# Patient Record
Sex: Female | Born: 1950
Health system: Southern US, Community
[De-identification: ages and names within clinical notes are randomized; demographics above are authoritative.]

## PROBLEM LIST (undated history)

## (undated) DIAGNOSIS — D649 Anemia, unspecified: Secondary | ICD-10-CM

## (undated) DIAGNOSIS — K219 Gastro-esophageal reflux disease without esophagitis: Secondary | ICD-10-CM

## (undated) DIAGNOSIS — T7840XA Allergy, unspecified, initial encounter: Secondary | ICD-10-CM

## (undated) DIAGNOSIS — I10 Essential (primary) hypertension: Secondary | ICD-10-CM

## (undated) DIAGNOSIS — H3530 Unspecified macular degeneration: Secondary | ICD-10-CM

## (undated) DIAGNOSIS — H35039 Hypertensive retinopathy, unspecified eye: Secondary | ICD-10-CM

## (undated) DIAGNOSIS — H269 Unspecified cataract: Secondary | ICD-10-CM

## (undated) DIAGNOSIS — Z5189 Encounter for other specified aftercare: Secondary | ICD-10-CM

## (undated) DIAGNOSIS — F419 Anxiety disorder, unspecified: Secondary | ICD-10-CM

## (undated) DIAGNOSIS — M1990 Unspecified osteoarthritis, unspecified site: Secondary | ICD-10-CM

## (undated) HISTORY — DX: Encounter for other specified aftercare: Z51.89

## (undated) HISTORY — DX: Unspecified macular degeneration: H35.30

## (undated) HISTORY — PX: ACHILLES TENDON REPAIR: SUR1153

## (undated) HISTORY — DX: Allergy, unspecified, initial encounter: T78.40XA

## (undated) HISTORY — PX: HERNIA REPAIR: SHX51

## (undated) HISTORY — DX: Essential (primary) hypertension: I10

## (undated) HISTORY — PX: JOINT REPLACEMENT: SHX530

## (undated) HISTORY — PX: ABDOMINAL HYSTERECTOMY: SHX81

## (undated) HISTORY — PX: SPINE SURGERY: SHX786

## (undated) HISTORY — PX: BILATERAL CARPAL TUNNEL RELEASE: SHX6508

## (undated) HISTORY — PX: COLONOSCOPY: SHX174

## (undated) HISTORY — DX: Hypertensive retinopathy, unspecified eye: H35.039

## (undated) HISTORY — PX: APPENDECTOMY: SHX54

## (undated) HISTORY — DX: Anxiety disorder, unspecified: F41.9

## (undated) HISTORY — PX: BACK SURGERY: SHX140

## (undated) HISTORY — PX: CHOLECYSTECTOMY: SHX55

## (undated) HISTORY — DX: Unspecified cataract: H26.9

---

## 2015-06-14 ENCOUNTER — Ambulatory Visit (INDEPENDENT_AMBULATORY_CARE_PROVIDER_SITE_OTHER): Payer: Medicare Other | Admitting: Emergency Medicine

## 2015-06-14 VITALS — BP 122/80 | HR 63 | Temp 98.4°F | Resp 16 | Ht 59.0 in | Wt 221.0 lb

## 2015-06-14 DIAGNOSIS — G4700 Insomnia, unspecified: Secondary | ICD-10-CM

## 2015-06-14 MED ORDER — ZOLPIDEM TARTRATE 5 MG PO TABS: 10.0000 mg | ORAL_TABLET | Freq: Every evening | ORAL | Status: DC | PRN

## 2015-06-14 NOTE — Patient Instructions (Signed)
Insomnia Insomnia is frequent trouble falling and/or staying asleep. Insomnia can be a long term problem or a short term problem. Both are common. Insomnia can be a short term problem when the wakefulness is related to a certain stress or worry. Long term insomnia is often related to ongoing stress during waking hours and/or poor sleeping habits. Overtime, sleep deprivation itself can make the problem worse. Every little thing feels more severe because you are overtired and your ability to cope is decreased. CAUSES   Stress, anxiety, and depression.  Poor sleeping habits.  Distractions such as TV in the bedroom.  Naps close to bedtime.  Engaging in emotionally charged conversations before bed.  Technical reading before sleep.  Alcohol and other sedatives. They may make the problem worse. They can hurt normal sleep patterns and normal dream activity.  Stimulants such as caffeine for several hours prior to bedtime.  Pain syndromes and shortness of breath can cause insomnia.  Exercise late at night.  Changing time zones may cause sleeping problems (jet lag). It is sometimes helpful to have someone observe your sleeping patterns. They should look for periods of not breathing during the night (sleep apnea). They should also look to see how long those periods last. If you live alone or observers are uncertain, you can also be observed at a sleep clinic where your sleep patterns will be professionally monitored. Sleep apnea requires a checkup and treatment. Give your caregivers your medical history. Give your caregivers observations your family has made about your sleep.  SYMPTOMS   Not feeling rested in the morning.  Anxiety and restlessness at bedtime.  Difficulty falling and staying asleep. TREATMENT   Your caregiver may prescribe treatment for an underlying medical disorders. Your caregiver can give advice or help if you are using alcohol or other drugs for self-medication. Treatment  of underlying problems will usually eliminate insomnia problems.  Medications can be prescribed for short time use. They are generally not recommended for lengthy use.  Over-the-counter sleep medicines are not recommended for lengthy use. They can be habit forming.  You can promote easier sleeping by making lifestyle changes such as:  Using relaxation techniques that help with breathing and reduce muscle tension.  Exercising earlier in the day.  Changing your diet and the time of your last meal. No night time snacks.  Establish a regular time to go to bed.  Counseling can help with stressful problems and worry.  Soothing music and white noise may be helpful if there are background noises you cannot remove.  Stop tedious detailed work at least one hour before bedtime. HOME CARE INSTRUCTIONS   Keep a diary. Inform your caregiver about your progress. This includes any medication side effects. See your caregiver regularly. Take note of:  Times when you are asleep.  Times when you are awake during the night.  The quality of your sleep.  How you feel the next day. This information will help your caregiver care for you.  Get out of bed if you are still awake after 15 minutes. Read or do some quiet activity. Keep the lights down. Wait until you feel sleepy and go back to bed.  Keep regular sleeping and waking hours. Avoid naps.  Exercise regularly.  Avoid distractions at bedtime. Distractions include watching television or engaging in any intense or detailed activity like attempting to balance the household checkbook.  Develop a bedtime ritual. Keep a familiar routine of bathing, brushing your teeth, climbing into bed at the same   time each night, listening to soothing music. Routines increase the success of falling to sleep faster.  Use relaxation techniques. This can be using breathing and muscle tension release routines. It can also include visualizing peaceful scenes. You can  also help control troubling or intruding thoughts by keeping your mind occupied with boring or repetitive thoughts like the old concept of counting sheep. You can make it more creative like imagining planting one beautiful flower after another in your backyard garden.  During your day, work to eliminate stress. When this is not possible use some of the previous suggestions to help reduce the anxiety that accompanies stressful situations. MAKE SURE YOU:   Understand these instructions.  Will watch your condition.  Will get help right away if you are not doing well or get worse. Document Released: 10/18/2000 Document Revised: 01/13/2012 Document Reviewed: 11/18/2007 [employer] Patient Information 2015 [employer]. This information is not intended to replace advice given to you by your health care provider. Make sure you discuss any questions you have with your health care provider.  

## 2015-06-14 NOTE — Progress Notes (Signed)
Subjective:  Patient ID: Faith Reeves, female    DOB: 30-Jun-1951  Age: 64 y.o. MRN: [id number]  CC: Medication Refill   HPI Faith Reeves presents  patients recently moved here from [place]. She was taking Ambien for her insomnia. She's run out of her medication and is no longer seeing a Dr. Janalee Reeves. She has not yet established care with a regular doctor here in town. Medication well has no hangover or adverse effect of the medication. She denies any other new or chronic complaints  History Faith Reeves has a past medical history of Blood transfusion without reported diagnosis; Cataract; and Macular degeneration.   She has past surgical history that includes Cholecystectomy; Hernia repair; Abdominal hysterectomy; Joint replacement; and Back surgery.   Her  family history includes Hypertension in her mother.  She   reports that she has never smoked. She does not have any smokeless tobacco history on file. Her alcohol and drug histories are not on file.  No outpatient prescriptions prior to visit.   No facility-administered medications prior to visit.    Social History   Social History  . Marital Status: Married    Spouse Name: N/A  . Number of Children: N/A  . Years of Education: N/A   Social History Main Topics  . Smoking status: Never Smoker   . Smokeless tobacco: None  . Alcohol Use: None  . Drug Use: None  . Sexual Activity: Not Asked   Other Topics Concern  . None   Social History Narrative  . None     Review of Systems  Constitutional: Negative for fever, chills and appetite change.  HENT: Negative for congestion, ear pain, postnasal drip, sinus pressure and sore throat.   Eyes: Negative for pain and redness.  Respiratory: Negative for cough, shortness of breath and wheezing.   Cardiovascular: Negative for leg swelling.  Gastrointestinal: Negative for nausea, vomiting, abdominal pain, diarrhea, constipation and blood in stool.  Endocrine: Negative for  polyuria.  Genitourinary: Negative for dysuria, urgency, frequency and flank pain.  Musculoskeletal: Negative for gait problem.  Skin: Negative for rash.  Neurological: Negative for weakness and headaches.  Psychiatric/Behavioral: Negative for confusion and decreased concentration. The patient is not nervous/anxious.     Objective:  BP 122/80 mmHg  Pulse 63  Temp(Src) 98.4 F (36.9 C) (Oral)  Resp 16  Ht 4\' 11"  (1.499 m)  Wt 221 lb (100.245 kg)  BMI 44.61 kg/m2  SpO2 98%  Physical Exam  Constitutional: She is oriented to person, place, and time. She appears well-developed and well-nourished. No distress.  HENT:  Head: Normocephalic and atraumatic.  Right Ear: External ear normal.  Left Ear: External ear normal.  Nose: Nose normal.  Eyes: Conjunctivae and EOM are normal. Pupils are equal, round, and reactive to light. No scleral icterus.  Neck: Normal range of motion. Neck supple. No tracheal deviation present.  Cardiovascular: Normal rate, regular rhythm and normal heart sounds.   Pulmonary/Chest: Effort normal. No respiratory distress. She has no wheezes. She has no rales.  Abdominal: She exhibits no mass. There is no tenderness. There is no rebound and no guarding.  Musculoskeletal: She exhibits no edema.  Lymphadenopathy:    She has no cervical adenopathy.  Neurological: She is alert and oriented to person, place, and time. Coordination normal.  Skin: Skin is warm and dry. No rash noted.  Psychiatric: She has a normal mood and affect. Her behavior is normal.      Assessment &  Plan:   Faith Reeves was seen today for medication refill.  Diagnoses and all orders for this visit:  Insomnia  Other orders -     zolpidem (AMBIEN) 5 MG tablet; Take 2 tablets (10 mg total) by mouth at bedtime as needed for sleep.   I have changed Faith Reeves's zolpidem. I am also having her maintain her oxyCODONE-acetaminophen, sertraline, celecoxib, pantoprazole, gabapentin, and  losartan.  Meds ordered this encounter  Medications  . oxyCODONE-acetaminophen (PERCOCET) 10-325 MG per tablet    Sig: Take 1 tablet by mouth every 4 (four) hours as needed for pain.  Marland Kitchen sertraline (ZOLOFT) 50 MG tablet    Sig: Take 50 mg by mouth daily.  . celecoxib (CELEBREX) 200 MG capsule    Sig: Take 200 mg by mouth 2 (two) times daily.  Marland Kitchen DISCONTD: zolpidem (AMBIEN) 5 MG tablet    Sig: Take 10 mg by mouth at bedtime as needed for sleep.  . pantoprazole (PROTONIX) 40 MG tablet    Sig: Take 40 mg by mouth daily.  Marland Kitchen gabapentin (NEURONTIN) 300 MG capsule    Sig: Take 300 mg by mouth 3 (three) times daily.  Marland Kitchen losartan (COZAAR) 100 MG tablet    Sig: Take 100 mg by mouth daily.  Marland Kitchen zolpidem (AMBIEN) 5 MG tablet    Sig: Take 2 tablets (10 mg total) by mouth at bedtime as needed for sleep.    Dispense:  30 tablet    Refill:  5    Appropriate red flag conditions were discussed with the patient as well as actions that should be taken.  Patient expressed his understanding.  Follow-up: Return if symptoms worsen or fail to improve.  Roselee Culver, MD

## 2015-11-09 DIAGNOSIS — K219 Gastro-esophageal reflux disease without esophagitis: Secondary | ICD-10-CM | POA: Diagnosis not present

## 2015-11-09 DIAGNOSIS — Z23 Encounter for immunization: Secondary | ICD-10-CM | POA: Diagnosis not present

## 2015-11-09 DIAGNOSIS — I10 Essential (primary) hypertension: Secondary | ICD-10-CM | POA: Diagnosis not present

## 2015-11-09 DIAGNOSIS — G4700 Insomnia, unspecified: Secondary | ICD-10-CM | POA: Diagnosis not present

## 2015-11-09 DIAGNOSIS — M1990 Unspecified osteoarthritis, unspecified site: Secondary | ICD-10-CM | POA: Diagnosis not present

## 2015-11-09 DIAGNOSIS — B009 Herpesviral infection, unspecified: Secondary | ICD-10-CM | POA: Diagnosis not present

## 2015-11-09 DIAGNOSIS — F419 Anxiety disorder, unspecified: Secondary | ICD-10-CM | POA: Diagnosis not present

## 2016-01-19 ENCOUNTER — Encounter: Payer: Self-pay | Admitting: Family Medicine

## 2016-01-19 DIAGNOSIS — T7840XA Allergy, unspecified, initial encounter: Secondary | ICD-10-CM | POA: Insufficient documentation

## 2016-01-19 DIAGNOSIS — Z5189 Encounter for other specified aftercare: Secondary | ICD-10-CM | POA: Insufficient documentation

## 2016-01-19 DIAGNOSIS — H269 Unspecified cataract: Secondary | ICD-10-CM | POA: Insufficient documentation

## 2016-01-19 DIAGNOSIS — F419 Anxiety disorder, unspecified: Secondary | ICD-10-CM | POA: Insufficient documentation

## 2016-01-19 DIAGNOSIS — I10 Essential (primary) hypertension: Secondary | ICD-10-CM | POA: Insufficient documentation

## 2016-01-29 ENCOUNTER — Encounter: Payer: Self-pay | Admitting: Family Medicine

## 2016-01-29 ENCOUNTER — Ambulatory Visit (INDEPENDENT_AMBULATORY_CARE_PROVIDER_SITE_OTHER): Payer: Medicare Other | Admitting: Family Medicine

## 2016-01-29 VITALS — BP 134/82 | HR 74 | Temp 98.1°F | Resp 18 | Ht 59.0 in | Wt 226.0 lb

## 2016-01-29 DIAGNOSIS — K219 Gastro-esophageal reflux disease without esophagitis: Secondary | ICD-10-CM

## 2016-01-29 DIAGNOSIS — H3530 Unspecified macular degeneration: Secondary | ICD-10-CM

## 2016-01-29 DIAGNOSIS — Z7189 Other specified counseling: Secondary | ICD-10-CM

## 2016-01-29 DIAGNOSIS — G8929 Other chronic pain: Secondary | ICD-10-CM | POA: Diagnosis not present

## 2016-01-29 DIAGNOSIS — Z1231 Encounter for screening mammogram for malignant neoplasm of breast: Secondary | ICD-10-CM | POA: Diagnosis not present

## 2016-01-29 DIAGNOSIS — I10 Essential (primary) hypertension: Secondary | ICD-10-CM

## 2016-01-29 DIAGNOSIS — L299 Pruritus, unspecified: Secondary | ICD-10-CM

## 2016-01-29 DIAGNOSIS — M545 Low back pain: Secondary | ICD-10-CM

## 2016-01-29 DIAGNOSIS — Z7689 Persons encountering health services in other specified circumstances: Secondary | ICD-10-CM

## 2016-01-29 DIAGNOSIS — Z1239 Encounter for other screening for malignant neoplasm of breast: Secondary | ICD-10-CM

## 2016-01-29 MED ORDER — MOMETASONE FUROATE 0.1 % EX CREA: 1.0000 "application " | TOPICAL_CREAM | Freq: Every day | CUTANEOUS | Status: DC

## 2016-01-29 NOTE — Progress Notes (Signed)
Subjective:    Patient ID: Faith Reeves, female    DOB: 09/07/1951, 65 y.o.   MRN: [id number]  HPI Here today to establish care.  Patient has a history of 3 separate back surgeries. She has severe scoliosis. She's had what sounds like Harrington rod placement in [place] as well as a laminectomy. She has chronic low back pain for which she takes gabapentin 300 mg 2-3 times a day for neuropathic pain in her legs. She also takes Percocet 10/325 however she does not take this regularly and usually only takes 1 pill a day to help her sleep at night. This patient is very adverse to taking medication and certainly does not seem like someone who wants to abuse pain pills. In fact she is tried to stop the medication the past however the pain in her back associated severe that she needs to take something at night to help her sleep. She has a visibly deformed spine with severe scoliosis particularly pronounced to the right side.  She is overdue for a exam as well as a breast exam. She does not require a Pap smear because she had a hysterectomy for fibroids. Colonoscopy was performed 5 years ago and is up-to-date and she refuses another. Immunizations are up-to-date except for the shingles vaccine Past Medical History  Diagnosis Date  . Macular degeneration   . Allergy   . Anxiety   . Blood transfusion without reported diagnosis   . Cataract   . Hypertension    Past Surgical History  Procedure Laterality Date  . Cholecystectomy    . Hernia repair    . Abdominal hysterectomy    . Joint replacement    . Back surgery    . Appendectomy    . Spine surgery     Current Outpatient Prescriptions on File Prior to Visit  Medication Sig Dispense Refill  . amoxicillin (AMOXIL) 500 MG capsule Take 2,000 mg by mouth once. Prior to dental work    . celecoxib (CELEBREX) 200 MG capsule Take 200 mg by mouth 2 (two) times daily.    Marland Kitchen gabapentin (NEURONTIN) 300 MG capsule Take 300 mg by mouth 3 (three) times daily.     Marland Kitchen losartan (COZAAR) 100 MG tablet Take 100 mg by mouth daily. Reported on 01/19/2016    . oxyCODONE-acetaminophen (PERCOCET) 10-325 MG per tablet Take 1 tablet by mouth every 4 (four) hours as needed for pain.    . pantoprazole (PROTONIX) 40 MG tablet Take 40 mg by mouth daily.    . sertraline (ZOLOFT) 50 MG tablet Take 50 mg by mouth daily.    Marland Kitchen VALACYCLOVIR HCL PO Take by mouth. Takes 2  2xday at onset     No current facility-administered medications on file prior to visit.   Allergies  Allergen Reactions  . Influenza Vaccines Nausea And Vomiting  . Cymbalta [Duloxetine Hcl] Swelling   Social History   Social History  . Marital Status: Married    Spouse Name: N/A  . Number of Children: N/A  . Years of Education: N/A   Occupational History  . Not on file.   Social History Main Topics  . Smoking status: Never Smoker   . Smokeless tobacco: Never Used  . Alcohol Use: 0.6 oz/week    0 Standard drinks or equivalent, 1 Glasses of wine per week  . Drug Use: No  . Sexual Activity: No   Other Topics Concern  . Not on file   Social History Narrative  Family History  Problem Relation Age of Onset  . Hypertension Mother   . Cancer Sister   . Stroke Maternal Grandmother      Review of Systems  All other systems reviewed and are negative.      Objective:   Physical Exam  Constitutional: She is oriented to person, place, and time. She appears well-developed and well-nourished. No distress.  HENT:  Head: Normocephalic and atraumatic.  Right Ear: External ear normal.  Left Ear: External ear normal.  Nose: Nose normal.  Mouth/Throat: Oropharynx is clear and moist. No oropharyngeal exudate.  Eyes: Conjunctivae and EOM are normal. Pupils are equal, round, and reactive to light. Right eye exhibits no discharge. Left eye exhibits no discharge. No scleral icterus.  Neck: Normal range of motion. Neck supple. No JVD present. No tracheal deviation present. No thyromegaly  present.  Cardiovascular: Normal rate, regular rhythm, normal heart sounds and intact distal pulses.  Exam reveals no gallop and no friction rub.   No murmur heard. Pulmonary/Chest: Effort normal and breath sounds normal. No stridor. No respiratory distress. She has no wheezes. She has no rales. She exhibits no tenderness.  Abdominal: Soft. Bowel sounds are normal. She exhibits no distension and no mass. There is no tenderness. There is no rebound and no guarding.  Musculoskeletal: Normal range of motion. She exhibits no edema or tenderness.  Lymphadenopathy:    She has no cervical adenopathy.  Neurological: She is alert and oriented to person, place, and time. She has normal reflexes. She displays normal reflexes. No cranial nerve deficit. She exhibits normal muscle tone. Coordination normal.  Skin: Skin is warm. No rash noted. She is not diaphoretic. No erythema. No pallor.  Psychiatric: She has a normal mood and affect. Her behavior is normal. Judgment and thought content normal.          Assessment & Plan:  Establishing care with new doctor, encounter for  Chronic low back pain  Gastroesophageal reflux disease, esophagitis presence not specified  Benign essential HTN - Plan: CBC with Differential/Platelet, COMPLETE METABOLIC PANEL WITH GFR, Lipid panel  I will schedule the patient for mammogram. I will schedule her to meet with an eye doctor given her history of macular degeneration. I asked her to return fasting for a CBC, CMP, and a fasting lipid panel. Pap smear is not necessary. Colonoscopy is up-to-date. She is not due for bone density for 2 more years that she had one 3 years ago that was normal. I will gladly refill her gabapentin and pain medication when necessary. She uses this sparingly and only as needed. We discussed the shingles vaccine and she will check on the price with her insurance

## 2016-01-30 ENCOUNTER — Other Ambulatory Visit: Payer: Medicare Other

## 2016-01-30 DIAGNOSIS — I10 Essential (primary) hypertension: Secondary | ICD-10-CM | POA: Diagnosis not present

## 2016-01-30 LAB — CBC WITH DIFFERENTIAL/PLATELET
Basophils Absolute: 0 10*3/uL (ref 0.0–0.1)
Basophils Relative: 0 % (ref 0–1)
EOS ABS: 0.2 10*3/uL (ref 0.0–0.7)
EOS PCT: 5 % (ref 0–5)
HCT: 40.1 % (ref 36.0–46.0)
Hemoglobin: 12.7 g/dL (ref 12.0–15.0)
LYMPHS ABS: 1.1 10*3/uL (ref 0.7–4.0)
Lymphocytes Relative: 22 % (ref 12–46)
MCH: 25.7 pg — AB (ref 26.0–34.0)
MCHC: 31.7 g/dL (ref 30.0–36.0)
MCV: 81.2 fL (ref 78.0–100.0)
MONOS PCT: 6 % (ref 3–12)
MPV: 11 fL (ref 8.6–12.4)
Monocytes Absolute: 0.3 10*3/uL (ref 0.1–1.0)
Neutro Abs: 3.3 10*3/uL (ref 1.7–7.7)
Neutrophils Relative %: 67 % (ref 43–77)
PLATELETS: 210 10*3/uL (ref 150–400)
RBC: 4.94 MIL/uL (ref 3.87–5.11)
RDW: 16.4 % — AB (ref 11.5–15.5)
WBC: 4.9 10*3/uL (ref 4.0–10.5)

## 2016-01-30 LAB — COMPLETE METABOLIC PANEL WITH GFR
ALT: 11 U/L (ref 6–29)
AST: 16 U/L (ref 10–35)
Albumin: 3.4 g/dL — ABNORMAL LOW (ref 3.6–5.1)
Alkaline Phosphatase: 113 U/L (ref 33–130)
BILIRUBIN TOTAL: 0.5 mg/dL (ref 0.2–1.2)
BUN: 19 mg/dL (ref 7–25)
CALCIUM: 9.1 mg/dL (ref 8.6–10.4)
CHLORIDE: 103 mmol/L (ref 98–110)
CO2: 27 mmol/L (ref 20–31)
Creat: 1.08 mg/dL — ABNORMAL HIGH (ref 0.50–0.99)
GFR, EST AFRICAN AMERICAN: 62 mL/min (ref >=60)
GFR, Est Non African American: 54 mL/min — ABNORMAL LOW (ref >=60)
Glucose, Bld: 87 mg/dL (ref 70–99)
Potassium: 4.3 mmol/L (ref 3.5–5.3)
Sodium: 140 mmol/L (ref 135–146)
TOTAL PROTEIN: 6.9 g/dL (ref 6.1–8.1)

## 2016-01-30 LAB — LIPID PANEL
CHOLESTEROL: 133 mg/dL (ref 125–200)
HDL: 72 mg/dL (ref >=46)
LDL CALC: 56 mg/dL (ref <130)
Total CHOL/HDL Ratio: 1.8 Ratio (ref <=5.0)
Triglycerides: 26 mg/dL (ref <150)
VLDL: 5 mg/dL (ref <30)

## 2016-02-05 ENCOUNTER — Encounter: Payer: Self-pay | Admitting: Family Medicine

## 2016-03-04 ENCOUNTER — Ambulatory Visit
Admission: RE | Admit: 2016-03-04 | Discharge: 2016-03-04 | Disposition: A | Discharge status: Home / Self Care | Payer: Medicare Other | Source: Ambulatory Visit | Attending: Family Medicine | Admitting: Family Medicine

## 2016-03-04 DIAGNOSIS — Z1239 Encounter for other screening for malignant neoplasm of breast: Secondary | ICD-10-CM

## 2016-03-04 DIAGNOSIS — Z1231 Encounter for screening mammogram for malignant neoplasm of breast: Secondary | ICD-10-CM | POA: Diagnosis not present

## 2016-03-05 ENCOUNTER — Ambulatory Visit (INDEPENDENT_AMBULATORY_CARE_PROVIDER_SITE_OTHER): Payer: Medicare Other | Admitting: Family Medicine

## 2016-03-05 ENCOUNTER — Encounter: Payer: Self-pay | Admitting: Family Medicine

## 2016-03-05 VITALS — BP 118/78 | HR 78 | Temp 98.6°F | Resp 18 | Ht 59.0 in | Wt 221.0 lb

## 2016-03-05 DIAGNOSIS — Z7189 Other specified counseling: Secondary | ICD-10-CM

## 2016-03-05 DIAGNOSIS — Z760 Encounter for issue of repeat prescription: Secondary | ICD-10-CM

## 2016-03-05 DIAGNOSIS — Z7689 Persons encountering health services in other specified circumstances: Secondary | ICD-10-CM

## 2016-03-05 MED ORDER — OXYCODONE-ACETAMINOPHEN 10-325 MG PO TABS: 1.0000 | ORAL_TABLET | ORAL | Status: DC | PRN

## 2016-03-05 NOTE — Progress Notes (Signed)
Subjective:    Patient ID: Faith Reeves, female    DOB: 1951/06/29, 65 y.o.   MRN: [id number]  HPI 01/29/16 Here today to establish care.  Patient has a history of 3 separate back surgeries. She has severe scoliosis. She's had what sounds like Harrington rod placement in [place] as well as a laminectomy. She has chronic low back pain for which she takes gabapentin 300 mg 2-3 times a day for neuropathic pain in her legs. She also takes Percocet 10/325 however she does not take this regularly and usually only takes 1 pill a day to help her sleep at night. This patient is very adverse to taking medication and certainly does not seem like someone who wants to abuse pain pills. In fact she is tried to stop the medication the past however the pain in her back associated severe that she needs to take something at night to help her sleep. She has a visibly deformed spine with severe scoliosis particularly pronounced to the right side.  She is overdue for a exam as well as a breast exam. She does not require a Pap smear because she had a hysterectomy for fibroids. Colonoscopy was performed 5 years ago and is up-to-date and she refuses another.  At that time, my plan was: I will schedule the patient for mammogram. I will schedule her to meet with an eye doctor given her history of macular degeneration. I asked her to return fasting for a CBC, CMP, and a fasting lipid panel. Pap smear is not necessary. Colonoscopy is up-to-date. She is not due for bone density for 2 more years that she had one 3 years ago that was normal. I will gladly refill her gabapentin and pain medication when necessary. She uses this sparingly and only as needed. We discussed the shingles vaccine and she will check on the price with her insurance  03/05/16 Past Medical History  Diagnosis Date  . Macular degeneration   . Allergy   . Anxiety   . Blood transfusion without reported diagnosis   . Cataract   . Hypertension    Past Surgical  History  Procedure Laterality Date  . Cholecystectomy    . Hernia repair    . Abdominal hysterectomy    . Joint replacement    . Back surgery    . Appendectomy    . Spine surgery     Current Outpatient Prescriptions on File Prior to Visit  Medication Sig Dispense Refill  . amoxicillin (AMOXIL) 500 MG capsule Take 2,000 mg by mouth once. Prior to dental work    . celecoxib (CELEBREX) 200 MG capsule Take 200 mg by mouth 2 (two) times daily.    Marland Kitchen gabapentin (NEURONTIN) 300 MG capsule Take 300 mg by mouth 3 (three) times daily.    Marland Kitchen losartan (COZAAR) 100 MG tablet Take 100 mg by mouth daily. Reported on 01/19/2016    . mometasone (ELOCON) 0.1 % cream Apply 1 application topically daily. 45 g 1  . oxyCODONE-acetaminophen (PERCOCET) 10-325 MG per tablet Take 1 tablet by mouth every 4 (four) hours as needed for pain.    . pantoprazole (PROTONIX) 40 MG tablet Take 40 mg by mouth daily.    . sertraline (ZOLOFT) 50 MG tablet Take 50 mg by mouth daily.    Marland Kitchen VALACYCLOVIR HCL PO Take by mouth. Takes 2  2xday at onset    . zolpidem (AMBIEN) 10 MG tablet Take 10 mg by mouth at bedtime as needed for  sleep.     No current facility-administered medications on file prior to visit.   Allergies  Allergen Reactions  . Influenza Vaccines Nausea And Vomiting  . Cymbalta [Duloxetine Hcl] Swelling   Social History   Social History  . Marital Status: Married    Spouse Name: N/A  . Number of Children: N/A  . Years of Education: N/A   Occupational History  . Not on file.   Social History Main Topics  . Smoking status: Never Smoker   . Smokeless tobacco: Never Used  . Alcohol Use: 0.6 oz/week    0 Standard drinks or equivalent, 1 Glasses of wine per week  . Drug Use: No  . Sexual Activity: No   Other Topics Concern  . Not on file   Social History Narrative   Family History  Problem Relation Age of Onset  . Hypertension Mother   . Cancer Sister   . Stroke Maternal Grandmother       Review of Systems  All other systems reviewed and are negative.      Objective:   Physical Exam  Constitutional: She is oriented to person, place, and time. She appears well-developed and well-nourished. No distress.  HENT:  Head: Normocephalic and atraumatic.  Right Ear: External ear normal.  Left Ear: External ear normal.  Nose: Nose normal.  Mouth/Throat: Oropharynx is clear and moist. No oropharyngeal exudate.  Eyes: Conjunctivae and EOM are normal. Pupils are equal, round, and reactive to light. Right eye exhibits no discharge. Left eye exhibits no discharge. No scleral icterus.  Neck: Normal range of motion. Neck supple. No JVD present. No tracheal deviation present. No thyromegaly present.  Cardiovascular: Normal rate, regular rhythm, normal heart sounds and intact distal pulses.  Exam reveals no gallop and no friction rub.   No murmur heard. Pulmonary/Chest: Effort normal and breath sounds normal. No stridor. No respiratory distress. She has no wheezes. She has no rales. She exhibits no tenderness.  Abdominal: Soft. Bowel sounds are normal. She exhibits no distension and no mass. There is no tenderness. There is no rebound and no guarding.  Musculoskeletal: Normal range of motion. She exhibits no edema or tenderness.  Lymphadenopathy:    She has no cervical adenopathy.  Neurological: She is alert and oriented to person, place, and time. She has normal reflexes. No cranial nerve deficit. She exhibits normal muscle tone. Coordination normal.  Skin: Skin is warm. No rash noted. She is not diaphoretic. No erythema. No pallor.  Psychiatric: She has a normal mood and affect. Her behavior is normal. Judgment and thought content normal.          Assessment & Plan:  Patient for the needed refill her pain medication. She did not require an office visit. I recommended an office visit every 6 months. Therefore she will not be charged an office visit today. I gave the patient 90  for pain medication. She takes 1 a day so this should last 3 months.

## 2016-03-19 ENCOUNTER — Other Ambulatory Visit: Payer: Self-pay | Admitting: Family Medicine

## 2016-03-19 NOTE — Telephone Encounter (Signed)
Medication called to pharmacy. 

## 2016-03-19 NOTE — Telephone Encounter (Signed)
ok 

## 2016-03-19 NOTE — Telephone Encounter (Signed)
Ok to refill Ambien?  Last OV 03/05/2016.

## 2016-04-12 ENCOUNTER — Other Ambulatory Visit: Payer: Self-pay | Admitting: Family Medicine

## 2016-04-15 ENCOUNTER — Ambulatory Visit (HOSPITAL_COMMUNITY)
Admission: EM | Admit: 2016-04-15 | Discharge: 2016-04-15 | Disposition: A | Discharge status: Home / Self Care | Payer: Medicare Other | Attending: Family Medicine | Admitting: Family Medicine

## 2016-04-15 ENCOUNTER — Encounter (HOSPITAL_COMMUNITY): Payer: Self-pay | Admitting: *Deleted

## 2016-04-15 DIAGNOSIS — J302 Other seasonal allergic rhinitis: Secondary | ICD-10-CM

## 2016-04-15 DIAGNOSIS — J9801 Acute bronchospasm: Secondary | ICD-10-CM

## 2016-04-15 MED ORDER — IPRATROPIUM BROMIDE 0.06 % NA SOLN: 2.0000 | Freq: Four times a day (QID) | NASAL | Status: DC

## 2016-04-15 MED ORDER — PREDNISONE 20 MG PO TABS: ORAL_TABLET | ORAL | Status: DC

## 2016-04-15 MED ORDER — PREDNISONE 20 MG PO TABS
ORAL_TABLET | ORAL | Status: AC
  Filled 2016-04-15: qty 3

## 2016-04-15 MED ORDER — IPRATROPIUM-ALBUTEROL 0.5-2.5 (3) MG/3ML IN SOLN
RESPIRATORY_TRACT | Status: AC
  Filled 2016-04-15: qty 3

## 2016-04-15 MED ORDER — ALBUTEROL SULFATE HFA 108 (90 BASE) MCG/ACT IN AERS: 2.0000 | INHALATION_SPRAY | RESPIRATORY_TRACT | Status: AC | PRN

## 2016-04-15 MED ORDER — PREDNISONE 20 MG PO TABS
60.0000 mg | ORAL_TABLET | Freq: Once | ORAL | Status: AC
  Administered 2016-04-15: 60 mg via ORAL

## 2016-04-15 MED ORDER — ALBUTEROL SULFATE HFA 108 (90 BASE) MCG/ACT IN AERS: 2.0000 | INHALATION_SPRAY | RESPIRATORY_TRACT | Status: DC | PRN

## 2016-04-15 MED ORDER — IPRATROPIUM-ALBUTEROL 0.5-2.5 (3) MG/3ML IN SOLN
3.0000 mL | Freq: Once | RESPIRATORY_TRACT | Status: AC
  Administered 2016-04-15: 3 mL via RESPIRATORY_TRACT

## 2016-04-15 NOTE — ED Notes (Signed)
Pt  Reports  Symptoms  Of  Cough   With  Productive  Sputum  At time s     Onset  For about  1  Week  Pt  Reports  Had  Diarrhea   And  Headache  Last  Week  Which   Subsided   Several  Days  Ago

## 2016-04-15 NOTE — Discharge Instructions (Signed)
Allergic Rhinitis For nasal and head congestion may take Sudafed PE 10 mg every 4 hours as needed. Saline nasal spray used frequently. For drainage may use Allegra, Claritin or Zyrtec. If you need stronger medicine to stop drainage may take Chlor-Trimeton 2-4 mg every 4 hours. This may cause drowsiness. Ibuprofen 600 mg every 6 hours as needed for pain, discomfort or fever. Drink plenty of fluids and stay well-hydrated. Rhinocort or Flonase nasal spray can help prevent allergy symptoms if taken daily  Allergic rhinitis is when the mucous membranes in the nose respond to allergens. Allergens are particles in the air that cause your body to have an allergic reaction. This causes you to release allergic antibodies. Through a chain of events, these eventually cause you to release histamine into the blood stream. Although meant to protect the body, it is this release of histamine that causes your discomfort, such as frequent sneezing, congestion, and an itchy, runny nose.  CAUSES Seasonal allergic rhinitis (hay fever) is caused by pollen allergens that may come from grasses, trees, and weeds. Year-round allergic rhinitis (perennial allergic rhinitis) is caused by allergens such as house dust mites, pet dander, and mold spores. SYMPTOMS 1. Nasal stuffiness (congestion). 2. Itchy, runny nose with sneezing and tearing of the eyes. DIAGNOSIS Your health care provider can help you determine the allergen or allergens that trigger your symptoms. If you and your health care provider are unable to determine the allergen, skin or blood testing may be used. Your health care provider will diagnose your condition after taking your health history and performing a physical exam. Your health care provider may assess you for other related conditions, such as asthma, pink eye, or an ear infection. TREATMENT Allergic rhinitis does not have a cure, but it can be controlled by:  Medicines that block allergy symptoms. These  may include allergy shots, nasal sprays, and oral antihistamines.  Avoiding the allergen. Hay fever may often be treated with antihistamines in pill or nasal spray forms. Antihistamines block the effects of histamine. There are over-the-counter medicines that may help with nasal congestion and swelling around the eyes. Check with your health care provider before taking or giving this medicine. If avoiding the allergen or the medicine prescribed do not work, there are many new medicines your health care provider can prescribe. Stronger medicine may be used if initial measures are ineffective. Desensitizing injections can be used if medicine and avoidance does not work. Desensitization is when a patient is given ongoing shots until the body becomes less sensitive to the allergen. Make sure you follow up with your health care provider if problems continue. HOME CARE INSTRUCTIONS It is not possible to completely avoid allergens, but you can reduce your symptoms by taking steps to limit your exposure to them. It helps to know exactly what you are allergic to so that you can avoid your specific triggers. SEEK MEDICAL CARE IF:  You have a fever.  You develop a cough that does not stop easily (persistent).  You have shortness of breath.  You start wheezing.  Symptoms interfere with normal daily activities.   This information is not intended to replace advice given to you by your health care provider. Make sure you discuss any questions you have with your health care provider.   Document Released: 07/16/2001 Document Revised: 11/11/2014 Document Reviewed: 06/28/2013 Elsevier Interactive Patient Education 2016 [employer]  Bronchospasm, Adult A bronchospasm is when the tubes that carry air in and out of your lungs (airways) spasm  or tighten. During a bronchospasm it is hard to breathe. This is because the airways get smaller. A bronchospasm can be triggered by: 3. Allergies. These may be to  animals, pollen, food, or mold. 4. Infection. This is a common cause of bronchospasm. 5. Exercise. 6. Irritants. These include pollution, cigarette smoke, strong odors, aerosol sprays, and paint fumes. 7. Weather changes. 8. Stress. 42. Being emotional. HOME CARE   Always have a plan for getting help. Know when to call your doctor and local emergency services (911 in the [place] Know where you can get emergency care.  Only take medicines as told by your doctor.  If you were prescribed an inhaler or nebulizer machine, ask your doctor how to use it correctly. Always use a spacer with your inhaler if you were given one.  Stay calm during an attack. Try to relax and breathe more slowly.  Control your home environment:  Change your heating and air conditioning filter at least once a month.  Limit your use of fireplaces and wood stoves.  Do not  smoke. Do not  allow smoking in your home.  Avoid perfumes and fragrances.  Get rid of pests (such as roaches and mice) and their droppings.  Throw away plants if you see mold on them.  Keep your house clean and dust free.  Replace carpet with wood, tile, or vinyl flooring. Carpet can trap dander and dust.  Use allergy-proof pillows, mattress covers, and box spring covers.  Wash bed sheets and blankets every week in hot water. Dry them in a dryer.  Use blankets that are made of polyester or cotton.  Wash hands frequently. GET HELP IF:  You have muscle aches.  You have chest pain.  The thick spit you spit or cough up (sputum) changes from clear or white to yellow, green, gray, or bloody.  The thick spit you spit or cough up gets thicker.  There are problems that may be related to the medicine you are given such as:  A rash.  Itching.  Swelling.  Trouble breathing. GET HELP RIGHT AWAY IF:  You feel you cannot breathe or catch your breath.  You cannot stop coughing.  Your treatment is not helping you breathe  better.  You have very bad chest pain. MAKE SURE YOU:   Understand these instructions.  Will watch your condition.  Will get help right away if you are not doing well or get worse.   This information is not intended to replace advice given to you by your health care provider. Make sure you discuss any questions you have with your health care provider.   Document Released: 08/18/2009 Document Revised: 11/11/2014 Document Reviewed: 04/13/2013 Elsevier Interactive Patient Education 2016 [employer].  How to Use an Inhaler Using your inhaler correctly is very important. Good technique will make sure that the medicine reaches your lungs.  HOW TO USE AN INHALER: 10. Take the cap off the inhaler. 11. If this is the first time using your inhaler, you need to prime it. Shake the inhaler for 5 seconds. Release four puffs into the air, away from your face. Ask your doctor for help if you have questions. 12. Shake the inhaler for 5 seconds. 13. Turn the inhaler so the bottle is above the mouthpiece. 14. Put your pointer finger on top of the bottle. Your thumb holds the bottom of the inhaler. 15. Open your mouth. 16. Either hold the inhaler away from your mouth (the width of 2 fingers) or  place your lips tightly around the mouthpiece. Ask your doctor which way to use your inhaler. 17. Breathe out as much air as possible. 18. Breathe in and push down on the bottle 1 time to release the medicine. You will feel the medicine go in your mouth and throat. 19. Continue to take a deep breath in very slowly. Try to fill your lungs. 20. After you have breathed in completely, hold your breath for 10 seconds. This will help the medicine to settle in your lungs. If you cannot hold your breath for 10 seconds, hold it for as long as you can before you breathe out. 21. Breathe out slowly, through pursed lips. Whistling is an example of pursed lips. 22. If your doctor has told you to take more than 1 puff, wait  at least 15-30 seconds between puffs. This will help you get the best results from your medicine. Do not use the inhaler more than your doctor tells you to. 23. Put the cap back on the inhaler. 24. Follow the directions from your doctor or from the inhaler package about cleaning the inhaler. If you use more than one inhaler, ask your doctor which inhalers to use and what order to use them in. Ask your doctor to help you figure out when you will need to refill your inhaler.  If you use a steroid inhaler, always rinse your mouth with water after your last puff, gargle and spit out the water. Do not swallow the water. GET HELP IF:  The inhaler medicine only partially helps to stop wheezing or shortness of breath.  You are having trouble using your inhaler.  You have some increase in thick spit (phlegm). GET HELP RIGHT AWAY IF:  The inhaler medicine does not help your wheezing or shortness of breath or you have tightness in your chest.  You have dizziness, headaches, or fast heart rate.  You have chills, fever, or night sweats.  You have a large increase of thick spit, or your thick spit is bloody. MAKE SURE YOU:   Understand these instructions.  Will watch your condition.  Will get help right away if you are not doing well or get worse.   This information is not intended to replace advice given to you by your health care provider. Make sure you discuss any questions you have with your health care provider.   Document Released: 07/30/2008 Document Revised: 08/11/2013 Document Reviewed: 05/20/2013 Elsevier Interactive Patient Education [employer].

## 2016-04-15 NOTE — Telephone Encounter (Signed)
Ok to refill??  Last office visit 03/05/2016.  Last refill 03/19/2016.

## 2016-04-15 NOTE — ED Provider Notes (Signed)
CSN: [id number]     Arrival date & time 04/15/16  1815 History   First MD Initiated Contact with Patient 04/15/16 2007     Chief Complaint  Patient presents with  . Cough   (Consider location/radiation/quality/duration/timing/severity/associated sxs/prior Treatment) HPI Comments: 65 year old obese female presents to the urgent care with a history of cough, runny nose, yellow and brown sputum for one week. She states that she believes she is wheezing and feels heavy in her chest. She does not have a history of asthma or smoking. She is not taking any medications for these symptoms. Just a few days prior to the onset of the symptoms she had GI symptoms that included diarrhea and nausea. The symptoms have subsided.   Past Medical History  Diagnosis Date  . Macular degeneration   . Allergy   . Anxiety   . Blood transfusion without reported diagnosis   . Cataract   . Hypertension    Past Surgical History  Procedure Laterality Date  . Cholecystectomy    . Hernia repair    . Abdominal hysterectomy    . Joint replacement    . Back surgery    . Appendectomy    . Spine surgery     Family History  Problem Relation Age of Onset  . Hypertension Mother   . Cancer Sister   . Stroke Maternal Grandmother    Social History  Substance Use Topics  . Smoking status: Never Smoker   . Smokeless tobacco: Never Used  . Alcohol Use: 0.6 oz/week    0 Standard drinks or equivalent, 1 Glasses of wine per week   OB History    No data available     Review of Systems  Constitutional: Positive for activity change. Negative for fever, chills, appetite change and fatigue.  HENT: Positive for congestion, postnasal drip, rhinorrhea and sneezing. Negative for ear pain, facial swelling and trouble swallowing.   Eyes: Negative.   Respiratory: Positive for cough, chest tightness, shortness of breath and wheezing.   Cardiovascular: Negative.   Musculoskeletal: Negative for neck pain and neck stiffness.   Skin: Negative for pallor and rash.  Neurological: Negative.   All other systems reviewed and are negative.   Allergies  Influenza vaccines and Cymbalta  Home Medications   Prior to Admission medications   Medication Sig Start Date End Date Taking? Authorizing Provider  albuterol (PROVENTIL HFA;VENTOLIN HFA) 108 (90 Base) MCG/ACT inhaler Inhale 2 puffs into the lungs every 4 (four) hours as needed for wheezing or shortness of breath. 04/15/16   Janne Napoleon, NP  amoxicillin (AMOXIL) 500 MG capsule Take 2,000 mg by mouth once. Prior to dental work    Management consultant, MD  celecoxib (CELEBREX) 200 MG capsule Take 200 mg by mouth 2 (two) times daily.    Historical Provider, MD  gabapentin (NEURONTIN) 300 MG capsule Take 300 mg by mouth 3 (three) times daily.    Historical Provider, MD  ipratropium (ATROVENT) 0.06 % nasal spray Place 2 sprays into both nostrils 4 (four) times daily. 04/15/16   Janne Napoleon, NP  losartan (COZAAR) 100 MG tablet Take 100 mg by mouth daily. Reported on 01/19/2016    Historical Provider, MD  mometasone (ELOCON) 0.1 % cream Apply 1 application topically daily. 01/29/16   Susy Frizzle, MD  oxyCODONE-acetaminophen (PERCOCET) 10-325 MG tablet Take 1 tablet by mouth every 4 (four) hours as needed for pain. 03/05/16   Susy Frizzle, MD  pantoprazole (PROTONIX) 40 MG tablet TAKE 1  TABLET EVERY DAY 03/19/16   Donita BrooksWarren T Pickard, MD  predniSONE (DELTASONE) 20 MG tablet Take 2 tabs po on first day, 2 tabs second day, 2 tabs third day, 1 tab fourth day, 1 tab 5th day. Take with food. 04/15/16   Hayden Rasmussenavid Chanie Soucek, NP  sertraline (ZOLOFT) 50 MG tablet Take 50 mg by mouth daily.    Historical Provider, MD  VALACYCLOVIR HCL PO Take by mouth. Takes 2  2xday at onset    Historical Provider, MD  zolpidem (AMBIEN) 10 MG tablet TAKE 1 TABLET BY MOUTH AT BEDTIME AS NEEDED 04/15/16   Donita BrooksWarren T Pickard, MD   Meds Ordered and Administered this Visit   Medications  ipratropium-albuterol (DUONEB)  0.5-2.5 (3) MG/3ML nebulizer solution 3 mL (3 mLs Nebulization Given 04/15/16 2034)  predniSONE (DELTASONE) tablet 60 mg (60 mg Oral Given 04/15/16 2034)    BP 135/84 mmHg  Pulse 68  Temp(Src) 98.7 F (37.1 C) (Oral)  SpO2 97% No data found.   Physical Exam  Constitutional: She is oriented to person, place, and time. She appears well-developed and well-nourished. No distress.  HENT:  Mouth/Throat: No oropharyngeal exudate.  Bilateral TMs are retracted. Oropharynx with moderate amount of clear frothy PND and minor erythema.  Eyes: Conjunctivae and EOM are normal.  Neck: Normal range of motion. Neck supple.  Cardiovascular: Normal rate, regular rhythm and normal heart sounds.   Pulmonary/Chest: Effort normal. No respiratory distress. She has wheezes. She has no rales.  Inspiratory and primarily expiratory wheezing with prolonged expiratory phase. Cough upon taking the breath.  Abdominal: Soft. There is no tenderness.  Musculoskeletal: Normal range of motion. She exhibits no edema.  Lymphadenopathy:    She has no cervical adenopathy.  Neurological: She is alert and oriented to person, place, and time.  Skin: Skin is warm and dry. No rash noted.  Psychiatric: She has a normal mood and affect.  Nursing note and vitals reviewed.   ED Course  Procedures (including critical care time)  Labs Review Labs Reviewed - No data to display  Imaging Review No results found.   Visual Acuity Review  Right Eye Distance:   Left Eye Distance:   Bilateral Distance:    Right Eye Near:   Left Eye Near:    Bilateral Near:         MDM   1. Other seasonal allergic rhinitis   2. Cough due to bronchospasm   Post DuoNeb the patient states she is breathing better. Auscultation reveals a substantial reduction in wheezing bronchospasm. Much improved air movement and chest expansion. Coughing has decreased. Allergic Rhinitis For nasal and head congestion may take Sudafed PE 10 mg every 4  hours as needed. Saline nasal spray used frequently. For drainage may use Allegra, Claritin or Zyrtec. If you need stronger medicine to stop drainage may take Chlor-Trimeton 2-4 mg every 4 hours. This may cause drowsiness. Ibuprofen 600 mg every 6 hours as needed for pain, discomfort or fever. Drink plenty of fluids and stay well-hydrated. Rhinocort or Flonase nasal spray can help prevent allergy symptoms if taken daily Meds ordered this encounter  Medications  . ipratropium-albuterol (DUONEB) 0.5-2.5 (3) MG/3ML nebulizer solution 3 mL    Sig:   . predniSONE (DELTASONE) tablet 60 mg    Sig:   . albuterol (PROVENTIL HFA;VENTOLIN HFA) 108 (90 Base) MCG/ACT inhaler    Sig: Inhale 2 puffs into the lungs every 4 (four) hours as needed for wheezing or shortness of breath.    Dispense:  1 Inhaler    Refill:  0    Order Specific Question:  Supervising Provider    Answer:  Billy Fischer [id number]  . predniSONE (DELTASONE) 20 MG tablet    Sig: Take 2 tabs po on first day, 2 tabs second day, 2 tabs third day, 1 tab fourth day, 1 tab 5th day. Take with food.    Dispense:  8 tablet    Refill:  0    Order Specific Question:  Supervising Provider    Answer:  Billy Fischer [phone]  . ipratropium (ATROVENT) 0.06 % nasal spray    Sig: Place 2 sprays into both nostrils 4 (four) times daily.    Dispense:  15 mL    Refill:  1    Order Specific Question:  Supervising Provider    Answer:  Billy Fischer [[id number]       Janne Napoleon, NP 04/15/16 2105

## 2016-04-15 NOTE — Telephone Encounter (Signed)
ok 

## 2016-04-15 NOTE — Telephone Encounter (Signed)
rx called in

## 2016-05-17 ENCOUNTER — Other Ambulatory Visit: Payer: Self-pay | Admitting: Family Medicine

## 2016-05-17 NOTE — Telephone Encounter (Signed)
Ok to refill??  Last office visit 03/05/2016.  Last refill 04/15/2016.

## 2016-05-17 NOTE — Telephone Encounter (Signed)
ok 

## 2016-05-17 NOTE — Telephone Encounter (Signed)
Medication called to pharmacy. 

## 2016-06-18 ENCOUNTER — Other Ambulatory Visit: Payer: Self-pay | Admitting: Family Medicine

## 2016-06-19 NOTE — Telephone Encounter (Signed)
Ok to refill 

## 2016-06-20 NOTE — Telephone Encounter (Signed)
ok 

## 2016-06-24 NOTE — Telephone Encounter (Signed)
ok 

## 2016-06-26 ENCOUNTER — Telehealth: Payer: Self-pay | Admitting: Family Medicine

## 2016-06-26 MED ORDER — PANTOPRAZOLE SODIUM 40 MG PO TBEC: 40.0000 mg | DELAYED_RELEASE_TABLET | Freq: Every day | ORAL | 3 refills | Status: DC

## 2016-06-26 NOTE — Telephone Encounter (Signed)
Requesting refill on Oxycodone - Ok to refill??

## 2016-06-27 MED ORDER — OXYCODONE-ACETAMINOPHEN 10-325 MG PO TABS: 1.0000 | ORAL_TABLET | ORAL | 0 refills | Status: DC | PRN

## 2016-06-27 NOTE — Telephone Encounter (Signed)
ok 

## 2016-06-27 NOTE — Telephone Encounter (Signed)
RX printed, left up front and patient aware to pick up via vm 

## 2016-07-19 ENCOUNTER — Other Ambulatory Visit: Payer: Self-pay | Admitting: Family Medicine

## 2016-07-19 NOTE — Telephone Encounter (Signed)
Ok to refill 

## 2016-07-22 ENCOUNTER — Telehealth: Payer: Self-pay | Admitting: Family Medicine

## 2016-07-22 NOTE — Telephone Encounter (Signed)
ok 

## 2016-07-22 NOTE — Telephone Encounter (Signed)
Patient husband called in requesting a refill on ambien, patient is completely out and can't sleep without this medication. He states she uses [facility] on [address]. And they had requested it through the pharmacy but was told they needed to call office. They would like more than 1 refill.   CB# [phone]

## 2016-07-24 NOTE — Telephone Encounter (Signed)
Med called into pharm this am and pts husband aware via vm

## 2016-07-24 NOTE — Telephone Encounter (Signed)
Patients husband calling again regarding this medication.

## 2016-08-06 ENCOUNTER — Ambulatory Visit (INDEPENDENT_AMBULATORY_CARE_PROVIDER_SITE_OTHER): Payer: Medicare Other | Admitting: Family Medicine

## 2016-08-06 DIAGNOSIS — Z23 Encounter for immunization: Secondary | ICD-10-CM

## 2016-08-18 ENCOUNTER — Other Ambulatory Visit: Payer: Self-pay | Admitting: Family Medicine

## 2016-08-18 DIAGNOSIS — L299 Pruritus, unspecified: Secondary | ICD-10-CM

## 2016-08-20 ENCOUNTER — Other Ambulatory Visit: Payer: Self-pay | Admitting: Family Medicine

## 2016-08-21 NOTE — Telephone Encounter (Signed)
Ok to refill 

## 2016-08-22 NOTE — Telephone Encounter (Signed)
ok 

## 2016-09-09 ENCOUNTER — Ambulatory Visit (INDEPENDENT_AMBULATORY_CARE_PROVIDER_SITE_OTHER): Payer: Medicare Other | Admitting: Family Medicine

## 2016-09-09 ENCOUNTER — Encounter: Payer: Self-pay | Admitting: Family Medicine

## 2016-09-09 VITALS — BP 118/80 | HR 82 | Temp 98.4°F | Resp 16 | Ht 59.0 in | Wt 222.0 lb

## 2016-09-09 DIAGNOSIS — G8929 Other chronic pain: Secondary | ICD-10-CM

## 2016-09-09 DIAGNOSIS — H3530 Unspecified macular degeneration: Secondary | ICD-10-CM

## 2016-09-09 DIAGNOSIS — I10 Essential (primary) hypertension: Secondary | ICD-10-CM | POA: Diagnosis not present

## 2016-09-09 DIAGNOSIS — M5440 Lumbago with sciatica, unspecified side: Secondary | ICD-10-CM

## 2016-09-09 DIAGNOSIS — K219 Gastro-esophageal reflux disease without esophagitis: Secondary | ICD-10-CM

## 2016-09-09 NOTE — Progress Notes (Signed)
Subjective:    Patient ID: Faith Reeves, female    DOB: Aug 20, 1951, 65 y.o.   MRN: [id number]  HPI3/27/17 Here today to establish care.  Patient has a history of 3 separate back surgeries. She has severe scoliosis. She's had what sounds like Harrington rod placement in [place] as well as a laminectomy. She has chronic low back pain for which she takes gabapentin 300 mg 2-3 times a day for neuropathic pain in her legs. She also takes Percocet 10/325 however she does not take this regularly and usually only takes 1 pill a day to help her sleep at night. This patient is very adverse to taking medication and certainly does not seem like someone who wants to abuse pain pills. In fact she is tried to stop the medication the past however the pain in her back associated severe that she needs to take something at night to help her sleep. She has a visibly deformed spine with severe scoliosis particularly pronounced to the right side.  She is overdue for a exam as well as a breast exam. She does not require a Pap smear because she had a hysterectomy for fibroids. Colonoscopy was performed 5 years ago and is up-to-date and she refuses another.  At that time, my plan was: I will schedule the patient for mammogram. I will schedule her to meet with an eye doctor given her history of macular degeneration. I asked her to return fasting for a CBC, CMP, and a fasting lipid panel. Pap smear is not necessary. Colonoscopy is up-to-date. She is not due for bone density for 2 more years that she had one 3 years ago that was normal. I will gladly refill her gabapentin and pain medication when necessary. She uses this sparingly and only as needed. We discussed the shingles vaccine and she will check on the price with her insurance  03/05/16 Patient for the needed refill her pain medication. She did not require an office visit. I recommended an office visit every 6 months. Therefore she will not be charged an office visit today. I  gave the patient 90 for pain medication. She takes 1 a day so this should last 3 months.  09/09/16 Patient is here today for follow-up. She continues to take 1 Percocet a day for her chronic back pain and neuropathy. There is no evidence of abuse or diversion. However now she is complaining of hip pain in her right hip. The pain is located anteriorly in the inguinal crease. It is worse with standing and walking. She denies any falls or injuries.  She states that she has a history of a previous hip replacement in the pains feel similar to what she experienced prior to this. At the present time the pain is not so severe that she was to get an x-ray yet. She also has no interest in taking further medication. She is requesting referral to an ophthalmologist. She has a history of macular degeneration and needs an ophthalmologist locally that can follow this. I would like to check a CMP today to monitor her liver function. The remainder of her lab work from March was outstanding and is not due again until following March. Her immunizations are up-to-date except for the shingles vaccine which we discussed. She declines Hep C screen Past Medical History:  Diagnosis Date  . Allergy   . Anxiety   . Blood transfusion without reported diagnosis   . Cataract   . Hypertension   . Macular degeneration  Past Surgical History:  Procedure Laterality Date  . ABDOMINAL HYSTERECTOMY    . APPENDECTOMY    . BACK SURGERY    . CHOLECYSTECTOMY    . HERNIA REPAIR    . JOINT REPLACEMENT    . SPINE SURGERY     Current Outpatient Prescriptions on File Prior to Visit  Medication Sig Dispense Refill  . albuterol (PROVENTIL HFA;VENTOLIN HFA) 108 (90 Base) MCG/ACT inhaler Inhale 2 puffs into the lungs every 4 (four) hours as needed for wheezing or shortness of breath. 1 Inhaler 0  . albuterol (PROVENTIL HFA;VENTOLIN HFA) 108 (90 Base) MCG/ACT inhaler Inhale 2 puffs into the lungs every 4 (four) hours as needed for wheezing  or shortness of breath. 1 Inhaler 0  . amoxicillin (AMOXIL) 500 MG capsule Take 2,000 mg by mouth once. Prior to dental work    . celecoxib (CELEBREX) 200 MG capsule Take 200 mg by mouth 2 (two) times daily.    Marland Kitchen gabapentin (NEURONTIN) 300 MG capsule Take 300 mg by mouth 3 (three) times daily.    Marland Kitchen ipratropium (ATROVENT) 0.06 % nasal spray Place 2 sprays into both nostrils 4 (four) times daily. 15 mL 1  . losartan (COZAAR) 100 MG tablet Take 100 mg by mouth daily. Reported on 01/19/2016    . mometasone (ELOCON) 0.1 % cream APPLY 1 APPLICATION TOPICALLY DAILY. 45 g 1  . oxyCODONE-acetaminophen (PERCOCET) 10-325 MG tablet Take 1 tablet by mouth every 4 (four) hours as needed for pain. 90 tablet 0  . pantoprazole (PROTONIX) 40 MG tablet Take 1 tablet (40 mg total) by mouth daily. 90 tablet 3  . predniSONE (DELTASONE) 20 MG tablet Take 2 tabs po on first day, 2 tabs second day, 2 tabs third day, 1 tab fourth day, 1 tab 5th day. Take with food. 8 tablet 0  . sertraline (ZOLOFT) 50 MG tablet Take 50 mg by mouth daily.    Marland Kitchen VALACYCLOVIR HCL PO Take by mouth. Takes 2  2xday at onset    . zolpidem (AMBIEN) 10 MG tablet TAKE 1 TABLET BY MOUTH AT BEDTIME AS NEEDED 30 tablet 2   No current facility-administered medications on file prior to visit.    Allergies  Allergen Reactions  . Influenza Vaccines Nausea And Vomiting  . Cymbalta [Duloxetine Hcl] Swelling   Social History   Social History  . Marital status: Married    Spouse name: N/A  . Number of children: N/A  . Years of education: N/A   Occupational History  . Not on file.   Social History Main Topics  . Smoking status: Never Smoker  . Smokeless tobacco: Never Used  . Alcohol use 0.6 oz/week    1 Glasses of wine per week  . Drug use: No  . Sexual activity: No   Other Topics Concern  . Not on file   Social History Narrative  . No narrative on file   Family History  Problem Relation Age of Onset  . Hypertension Mother   .  Cancer Sister   . Stroke Maternal Grandmother      Review of Systems  All other systems reviewed and are negative.      Objective:   Physical Exam  Constitutional: She is oriented to person, place, and time. She appears well-developed and well-nourished. No distress.  HENT:  Head: Normocephalic and atraumatic.  Right Ear: External ear normal.  Left Ear: External ear normal.  Nose: Nose normal.  Mouth/Throat: Oropharynx is clear and moist. No oropharyngeal  exudate.  Eyes: Conjunctivae and EOM are normal. Pupils are equal, round, and reactive to light. Right eye exhibits no discharge. Left eye exhibits no discharge. No scleral icterus.  Neck: Normal range of motion. Neck supple. No JVD present. No tracheal deviation present. No thyromegaly present.  Cardiovascular: Normal rate, regular rhythm, normal heart sounds and intact distal pulses.  Exam reveals no gallop and no friction rub.   No murmur heard. Pulmonary/Chest: Effort normal and breath sounds normal. No stridor. No respiratory distress. She has no wheezes. She has no rales. She exhibits no tenderness.  Abdominal: Soft. Bowel sounds are normal. She exhibits no distension and no mass. There is no tenderness. There is no rebound and no guarding.  Musculoskeletal: Normal range of motion. She exhibits no edema or tenderness.  Lymphadenopathy:    She has no cervical adenopathy.  Neurological: She is alert and oriented to person, place, and time. She has normal reflexes. No cranial nerve deficit. She exhibits normal muscle tone. Coordination normal.  Skin: Skin is warm. No rash noted. She is not diaphoretic. No erythema. No pallor.  Psychiatric: She has a normal mood and affect. Her behavior is normal. Judgment and thought content normal.          Assessment & Plan:  Chronic low back pain with sciatica, sciatica laterality unspecified, unspecified back pain laterality  Benign essential HTN - Plan: COMPLETE METABOLIC PANEL WITH  GFR  Gastroesophageal reflux disease, esophagitis presence not specified  Macular degeneration of right eye  Her blood pressure today is well controlled. I will check a CMP to monitor her renal function and her liver function test particular given the fact she takes Tylenol. Her immunizations are up-to-date. We did discuss the shingles vaccine but she would check on the price first. She declines hepatitis C screening. She also declines an x-ray for her right hip unless the pain worsens. I will gladly consult ophthalmology to follow her macular degeneration

## 2016-09-10 LAB — COMPLETE METABOLIC PANEL WITH GFR
ALBUMIN: 3.7 g/dL (ref 3.6–5.1)
ALK PHOS: 122 U/L (ref 33–130)
ALT: 10 U/L (ref 6–29)
AST: 18 U/L (ref 10–35)
BUN: 20 mg/dL (ref 7–25)
CHLORIDE: 104 mmol/L (ref 98–110)
CO2: 27 mmol/L (ref 20–31)
Calcium: 9.2 mg/dL (ref 8.6–10.4)
Creat: 1.28 mg/dL — ABNORMAL HIGH (ref 0.50–0.99)
GFR, EST NON AFRICAN AMERICAN: 43 mL/min — AB (ref >=60)
GFR, Est African American: 50 mL/min — ABNORMAL LOW (ref >=60)
GLUCOSE: 101 mg/dL — AB (ref 70–99)
POTASSIUM: 3.4 mmol/L — AB (ref 3.5–5.3)
SODIUM: 140 mmol/L (ref 135–146)
Total Bilirubin: 0.5 mg/dL (ref 0.2–1.2)
Total Protein: 6.9 g/dL (ref 6.1–8.1)

## 2016-09-30 ENCOUNTER — Telehealth: Payer: Self-pay | Admitting: Family Medicine

## 2016-09-30 NOTE — Telephone Encounter (Signed)
Legrand Como husband, called requesting refill on patients oxyCODONE-acetaminophen ([place]) 10-325   CB# [phone]

## 2016-09-30 NOTE — Telephone Encounter (Signed)
Legrand Como the son called to check the status of this refill he states patient is scheduled to go to [place] tomorrow. He is requesting this be done today. I advised him that it more than likely would be ready tomorrow since doctor was seeing patients.  CB# [phone]

## 2016-09-30 NOTE — Telephone Encounter (Signed)
Ok to refill 

## 2016-09-30 NOTE — Telephone Encounter (Signed)
ok 

## 2016-10-01 MED ORDER — OXYCODONE-ACETAMINOPHEN 10-325 MG PO TABS: 1.0000 | ORAL_TABLET | ORAL | 0 refills | Status: DC | PRN

## 2016-10-01 NOTE — Telephone Encounter (Signed)
RX printed, left up front and patient aware to pick up  

## 2016-10-14 DIAGNOSIS — H40013 Open angle with borderline findings, low risk, bilateral: Secondary | ICD-10-CM | POA: Diagnosis not present

## 2016-10-14 DIAGNOSIS — H353131 Nonexudative age-related macular degeneration, bilateral, early dry stage: Secondary | ICD-10-CM | POA: Diagnosis not present

## 2016-10-14 DIAGNOSIS — H2513 Age-related nuclear cataract, bilateral: Secondary | ICD-10-CM | POA: Diagnosis not present

## 2016-11-25 ENCOUNTER — Telehealth: Payer: Self-pay | Admitting: Family Medicine

## 2016-11-25 MED ORDER — CELECOXIB 200 MG PO CAPS: 200.0000 mg | ORAL_CAPSULE | Freq: Two times a day (BID) | ORAL | 3 refills | Status: DC

## 2016-11-25 MED ORDER — LOSARTAN POTASSIUM 100 MG PO TABS: 100.0000 mg | ORAL_TABLET | Freq: Every day | ORAL | 1 refills | Status: DC

## 2016-11-25 MED ORDER — PANTOPRAZOLE SODIUM 40 MG PO TBEC: 40.0000 mg | DELAYED_RELEASE_TABLET | Freq: Every day | ORAL | 3 refills | Status: DC

## 2016-11-25 NOTE — Telephone Encounter (Signed)
Medication called/sent to requested pharmacy  

## 2016-11-25 NOTE — Telephone Encounter (Signed)
Needs a refill on Losartan, gabapentin, pantoprazole, and celecoxib.

## 2016-12-02 ENCOUNTER — Telehealth: Payer: Self-pay | Admitting: Family Medicine

## 2016-12-02 MED ORDER — LOSARTAN POTASSIUM 100 MG PO TABS: 100.0000 mg | ORAL_TABLET | Freq: Every day | ORAL | 1 refills | Status: DC

## 2016-12-02 NOTE — Telephone Encounter (Signed)
Pt says that's wrong prescription was sent into the pharmacy, they needed the losartan called in.

## 2016-12-02 NOTE — Telephone Encounter (Signed)
Losartan was sent in on 11/25/2016 but will resend. Medication called/sent to requested pharmacy.

## 2016-12-03 ENCOUNTER — Other Ambulatory Visit: Payer: Self-pay | Admitting: Family Medicine

## 2016-12-03 MED ORDER — LOSARTAN POTASSIUM-HCTZ 100-12.5 MG PO TABS: 1.0000 | ORAL_TABLET | Freq: Every day | ORAL | 3 refills | Status: DC

## 2016-12-11 ENCOUNTER — Telehealth: Payer: Self-pay | Admitting: Family Medicine

## 2016-12-11 NOTE — Telephone Encounter (Signed)
Patient requesting a refill on Oxycodone & Ambien - Ok to refill??

## 2016-12-12 MED ORDER — ZOLPIDEM TARTRATE 10 MG PO TABS: 10.0000 mg | ORAL_TABLET | Freq: Every evening | ORAL | 2 refills | Status: DC | PRN

## 2016-12-12 MED ORDER — OXYCODONE-ACETAMINOPHEN 10-325 MG PO TABS: 1.0000 | ORAL_TABLET | ORAL | 0 refills | Status: DC | PRN

## 2016-12-12 NOTE — Telephone Encounter (Signed)
ok 

## 2016-12-12 NOTE — Telephone Encounter (Signed)
RX printed, left up front and patient aware to pick up via vm 

## 2016-12-18 ENCOUNTER — Encounter: Payer: Self-pay | Admitting: Family Medicine

## 2016-12-18 DIAGNOSIS — H3530 Unspecified macular degeneration: Secondary | ICD-10-CM | POA: Insufficient documentation

## 2016-12-25 DIAGNOSIS — R197 Diarrhea, unspecified: Secondary | ICD-10-CM | POA: Diagnosis not present

## 2016-12-25 DIAGNOSIS — R1110 Vomiting, unspecified: Secondary | ICD-10-CM | POA: Diagnosis not present

## 2016-12-25 DIAGNOSIS — F430 Acute stress reaction: Secondary | ICD-10-CM | POA: Diagnosis not present

## 2017-02-21 ENCOUNTER — Telehealth: Payer: Self-pay | Admitting: Family Medicine

## 2017-02-21 DIAGNOSIS — L299 Pruritus, unspecified: Secondary | ICD-10-CM

## 2017-02-21 MED ORDER — MOMETASONE FUROATE 0.1 % EX CREA
1.0000 | TOPICAL_CREAM | Freq: Every day | CUTANEOUS | 1 refills | Status: DC
Start: 2017-02-21 — End: 2017-08-21

## 2017-02-21 MED ORDER — OXYCODONE-ACETAMINOPHEN 10-325 MG PO TABS: 1.0000 | ORAL_TABLET | ORAL | 0 refills | Status: DC | PRN

## 2017-02-21 NOTE — Telephone Encounter (Signed)
RX printed, left up front and patient aware to pick up  

## 2017-02-21 NOTE — Telephone Encounter (Signed)
Ok to refill??      LRF 12/12/16

## 2017-02-21 NOTE — Telephone Encounter (Signed)
Pt needs oxycodone, mometasone sent to [facility]. Pt needs before Monday because they are leaving for [place].

## 2017-02-21 NOTE — Telephone Encounter (Signed)
ok 

## 2017-03-19 ENCOUNTER — Other Ambulatory Visit: Payer: Self-pay | Admitting: Family Medicine

## 2017-03-27 ENCOUNTER — Ambulatory Visit (INDEPENDENT_AMBULATORY_CARE_PROVIDER_SITE_OTHER): Payer: Medicare Other | Admitting: Physician Assistant

## 2017-03-27 ENCOUNTER — Encounter: Payer: Self-pay | Admitting: Physician Assistant

## 2017-03-27 VITALS — BP 122/78 | HR 75 | Temp 98.1°F | Resp 16 | Wt 209.2 lb

## 2017-03-27 DIAGNOSIS — J988 Other specified respiratory disorders: Secondary | ICD-10-CM

## 2017-03-27 DIAGNOSIS — B9689 Other specified bacterial agents as the cause of diseases classified elsewhere: Principal | ICD-10-CM

## 2017-03-27 MED ORDER — AZITHROMYCIN 250 MG PO TABS: ORAL_TABLET | ORAL | 0 refills | Status: DC

## 2017-03-27 MED ORDER — PREDNISONE 20 MG PO TABS: 20.0000 mg | ORAL_TABLET | Freq: Every day | ORAL | 0 refills | Status: DC

## 2017-03-27 NOTE — Progress Notes (Signed)
Patient ID: Faith Reeves MRN: [id number], DOB: [birth year], 66 y.o. Date of Encounter: 03/27/2017, 4:30 PM    Chief Complaint:  Chief Complaint  Patient presents with  . Cough    x2wks  . Headache  . Sore Throat  . trouble breathing  . Wheezing     HPI: 66 y.o. year old female presents with above.   She reports that the above information is accurate. She reports that she has had no known fever. She reports that she has used her albuterol inhaler at night about 3 nights over the last week.     Home Meds:   Outpatient Medications Prior to Visit  Medication Sig Dispense Refill  . albuterol (PROVENTIL HFA;VENTOLIN HFA) 108 (90 Base) MCG/ACT inhaler Inhale 2 puffs into the lungs every 4 (four) hours as needed for wheezing or shortness of breath. 1 Inhaler 0  . albuterol (PROVENTIL HFA;VENTOLIN HFA) 108 (90 Base) MCG/ACT inhaler Inhale 2 puffs into the lungs every 4 (four) hours as needed for wheezing or shortness of breath. 1 Inhaler 0  . amoxicillin (AMOXIL) 500 MG capsule Take 2,000 mg by mouth once. Prior to dental work    . gabapentin (NEURONTIN) 300 MG capsule Take 300 mg by mouth 3 (three) times daily.    Marland Kitchen ipratropium (ATROVENT) 0.06 % nasal spray Place 2 sprays into both nostrils 4 (four) times daily. 15 mL 1  . losartan-hydrochlorothiazide (HYZAAR) 100-12.5 MG tablet Take 1 tablet by mouth daily. 90 tablet 3  . mometasone (ELOCON) 0.1 % cream Apply 1 application topically daily. 45 g 1  . oxyCODONE-acetaminophen (PERCOCET) 10-325 MG tablet Take 1 tablet by mouth every 4 (four) hours as needed for pain. 90 tablet 0  . pantoprazole (PROTONIX) 40 MG tablet Take 1 tablet (40 mg total) by mouth daily. 90 tablet 3  . sertraline (ZOLOFT) 50 MG tablet Take 50 mg by mouth daily.    Marland Kitchen VALACYCLOVIR HCL PO Take by mouth. Takes 2  2xday at onset    . zolpidem (AMBIEN) 10 MG tablet Take 1 tablet (10 mg total) by mouth at bedtime as needed. 30 tablet 2  . celecoxib (CELEBREX) 200 MG  capsule TAKE 1 CAPSULE (200 MG TOTAL) BY MOUTH 2 (TWO) TIMES DAILY. 60 capsule 3  . predniSONE (DELTASONE) 20 MG tablet Take 2 tabs po on first day, 2 tabs second day, 2 tabs third day, 1 tab fourth day, 1 tab 5th day. Take with food. 8 tablet 0   No facility-administered medications prior to visit.     Allergies:  Allergies  Allergen Reactions  . Influenza Vaccines Nausea And Vomiting  . Cymbalta [Duloxetine Hcl] Swelling      Review of Systems: See HPI for pertinent ROS. All other ROS negative.    Physical Exam: Blood pressure 122/78, pulse 75, temperature 98.1 F (36.7 C), temperature source Oral, resp. rate 16, weight 209 lb 3.2 oz (94.9 kg), SpO2 98 %., Body mass index is 42.25 kg/m. General:  Obese female . Appears in no acute distress. HEENT: Normocephalic, atraumatic, eyes without discharge, sclera non-icteric, nares are without discharge. Bilateral auditory canals clear, TM's are without perforation, pearly grey and translucent with reflective cone of light bilaterally. Oral cavity moist, posterior pharynx without exudate, erythema, peritonsillar abscess. No tenderness with percussion to frontal or maxillary sinuses bilaterally.  Neck: Supple. No thyromegaly. No lymphadenopathy. Lungs: Clear bilaterally to auscultation without wheezes, rales, or rhonchi. Breathing is unlabored. Heart: Regular rhythm. No murmurs, rubs, or gallops.  Msk:  Strength and tone normal for age. Extremities/Skin: Warm and dry.  Neuro: Alert and oriented X 3. Moves all extremities spontaneously. Gait is normal. CNII-XII grossly in tact. Psych:  Responds to questions appropriately with a normal affect.     ASSESSMENT AND PLAN:  66 y.o. year old female with  1. Bacterial respiratory infection Lungs are clear on exam at this time with no wheezing but suspect that she is having some intermittent wheezing especially at night. Will give low-dose prednisone in addition to antibiotic. She is to take these  as directed. Follow-up if symptoms worsen or do not return to normal baseline upon completion of these. - azithromycin (ZITHROMAX) 250 MG tablet; Day 1: Take 2 daily. Days 2 -5: Take 1 daily.  Dispense: 6 tablet; Refill: 0 - predniSONE (DELTASONE) 20 MG tablet; Take 1 tablet (20 mg total) by mouth daily with breakfast.  Dispense: 5 tablet; Refill: 0   Signed, [address] [place], [place], [facility] 03/27/2017 4:30 PM

## 2017-04-14 DIAGNOSIS — H40013 Open angle with borderline findings, low risk, bilateral: Secondary | ICD-10-CM | POA: Diagnosis not present

## 2017-04-14 DIAGNOSIS — H40033 Anatomical narrow angle, bilateral: Secondary | ICD-10-CM | POA: Diagnosis not present

## 2017-05-04 ENCOUNTER — Other Ambulatory Visit: Payer: Self-pay | Admitting: Family Medicine

## 2017-05-05 NOTE — Telephone Encounter (Signed)
ok 

## 2017-05-05 NOTE — Telephone Encounter (Signed)
Ok to refill 

## 2017-06-20 ENCOUNTER — Telehealth: Payer: Self-pay | Admitting: Family Medicine

## 2017-06-20 MED ORDER — OXYCODONE-ACETAMINOPHEN 10-325 MG PO TABS: 1.0000 | ORAL_TABLET | ORAL | 0 refills | Status: DC | PRN

## 2017-06-20 MED ORDER — PANTOPRAZOLE SODIUM 40 MG PO TBEC: 40.0000 mg | DELAYED_RELEASE_TABLET | Freq: Every day | ORAL | 3 refills | Status: DC

## 2017-06-20 NOTE — Telephone Encounter (Signed)
ok 

## 2017-06-20 NOTE — Telephone Encounter (Signed)
RX printed, left up front and patient aware to pick up  

## 2017-06-20 NOTE — Telephone Encounter (Signed)
Ok to refill 

## 2017-06-20 NOTE — Telephone Encounter (Signed)
New Message   *STAT* If patient is at the pharmacy, call can be transferred to refill team.   1. Which medications need to be refilled? (please list name of each medication and dose if known)  protonix 40 mg tablet once daily oxycodone 10-325 mg tablet take every 4 hours as needed  2. Which pharmacy/location (including street and city if local pharmacy) is medication to be sent to? [facility] [address], [place], [place]  3. Do they need a 30 day or 90 day supply?  30 day supply

## 2017-07-16 ENCOUNTER — Other Ambulatory Visit: Payer: Self-pay | Admitting: Family Medicine

## 2017-07-30 ENCOUNTER — Other Ambulatory Visit: Payer: Self-pay | Admitting: Family Medicine

## 2017-07-30 NOTE — Telephone Encounter (Signed)
Ok to refill 

## 2017-07-31 NOTE — Telephone Encounter (Signed)
ok 

## 2017-08-15 ENCOUNTER — Encounter: Payer: Medicare Other | Admitting: Family Medicine

## 2017-08-18 ENCOUNTER — Encounter: Payer: Self-pay | Admitting: Family Medicine

## 2017-08-18 ENCOUNTER — Ambulatory Visit (INDEPENDENT_AMBULATORY_CARE_PROVIDER_SITE_OTHER): Payer: Medicare Other | Admitting: Family Medicine

## 2017-08-18 VITALS — BP 130/98 | HR 76 | Temp 98.0°F | Resp 16 | Ht 58.5 in | Wt 222.0 lb

## 2017-08-18 DIAGNOSIS — R2231 Localized swelling, mass and lump, right upper limb: Secondary | ICD-10-CM | POA: Diagnosis not present

## 2017-08-18 DIAGNOSIS — M5440 Lumbago with sciatica, unspecified side: Secondary | ICD-10-CM | POA: Diagnosis not present

## 2017-08-18 DIAGNOSIS — K219 Gastro-esophageal reflux disease without esophagitis: Secondary | ICD-10-CM

## 2017-08-18 DIAGNOSIS — Z0000 Encounter for general adult medical examination without abnormal findings: Secondary | ICD-10-CM

## 2017-08-18 DIAGNOSIS — G8929 Other chronic pain: Secondary | ICD-10-CM

## 2017-08-18 DIAGNOSIS — Z1159 Encounter for screening for other viral diseases: Secondary | ICD-10-CM

## 2017-08-18 DIAGNOSIS — I10 Essential (primary) hypertension: Secondary | ICD-10-CM | POA: Diagnosis not present

## 2017-08-18 DIAGNOSIS — Z23 Encounter for immunization: Secondary | ICD-10-CM

## 2017-08-18 MED ORDER — SERTRALINE HCL 50 MG PO TABS: 75.0000 mg | ORAL_TABLET | Freq: Every day | ORAL | 11 refills | Status: DC

## 2017-08-19 ENCOUNTER — Other Ambulatory Visit: Payer: Self-pay

## 2017-08-19 DIAGNOSIS — L299 Pruritus, unspecified: Secondary | ICD-10-CM

## 2017-08-19 MED ORDER — OXYCODONE-ACETAMINOPHEN 10-325 MG PO TABS: 1.0000 | ORAL_TABLET | ORAL | 0 refills | Status: DC | PRN

## 2017-08-19 NOTE — Telephone Encounter (Signed)
Last OV 10/15 Last refill 8/17 Ok to refill?

## 2017-08-19 NOTE — Progress Notes (Signed)
Subjective:    Patient ID: Faith Reeves, female    DOB: [birth year], 66 y.o.   MRN: [id number]  HPI Here today for CPE.  Patient has a history of 3 separate back surgeries. She has severe scoliosis. She's had what sounds like Harrington rod placement in [place] as well as a laminectomy. She has chronic low back pain for which she takes gabapentin 300 mg 2-3 times a day for neuropathic pain in her legs. She also takes Percocet 10/325 however she does not take this regularly and usually only takes 1 pill a day to help her sleep at night. This patient is very adverse to taking medication and certainly does not seem like someone who wants to abuse pain pills. In fact she is tried to stop the medication the past however the pain in her back is so severe that she needs to take something at night to help her sleep. She has a visibly deformed spine with severe scoliosis particularly pronounced to the right side.    Since I last saw her, her son suffered a hemorrhagic stroke due to a ruptured aneurysm. She spent several months in [place] for her son in intensive care. He has since moved in with the patient and her husband back home in [place]. He seems to be doing better. Although he has some memory loss, and he is now walking and was eating helping with yard work after the recent storm.  She does not require a Pap smear because she had a hysterectomy for fibroids. Colonoscopy was performed 6 years ago and is up-to-date and she refuses another.    She is overdue for a mammogram.She would like to schedule this on her own due to her full schedule carrying her son back and forth to doctor appointments. Her bone density is up-to-date. She is due for hepatitis C screening. She also reports a growing mass in her right arm near the shoulder near her axilla. It is approximately 2 x 3 cm. It is poorly defined with indistinct borders. It is firm and painful It appears to be confined to the subcutaneous fat raising some  concern for a liposarcoma versus an atypical lipoma versus a calcified hematoma.  Past Medical History:  Diagnosis Date  . Allergy   . Anxiety   . Blood transfusion without reported diagnosis   . Cataract   . Hypertension   . Macular degeneration    Past Surgical History:  Procedure Laterality Date  . ABDOMINAL HYSTERECTOMY    . APPENDECTOMY    . BACK SURGERY    . CHOLECYSTECTOMY    . HERNIA REPAIR    . JOINT REPLACEMENT    . SPINE SURGERY     Current Outpatient Prescriptions on File Prior to Visit  Medication Sig Dispense Refill  . albuterol (PROVENTIL HFA;VENTOLIN HFA) 108 (90 Base) MCG/ACT inhaler Inhale 2 puffs into the lungs every 4 (four) hours as needed for wheezing or shortness of breath. 1 Inhaler 0  . amoxicillin (AMOXIL) 500 MG capsule Take 2,000 mg by mouth once. Prior to dental work    . celecoxib (CELEBREX) 200 MG capsule Take 200 mg by mouth daily.    Marland Kitchen gabapentin (NEURONTIN) 300 MG capsule Take 300 mg by mouth 3 (three) times daily.    Marland Kitchen ipratropium (ATROVENT) 0.06 % nasal spray Place 2 sprays into both nostrils 4 (four) times daily. 15 mL 1  . losartan-hydrochlorothiazide (HYZAAR) 100-12.5 MG tablet Take 1 tablet by mouth daily. [place]  tablet 3  . mometasone (ELOCON) 0.1 % cream Apply 1 application topically daily. 45 g 1  . ondansetron (ZOFRAN-ODT) 8 MG disintegrating tablet Take 8 mg by mouth as needed.    Marland Kitchen oxyCODONE-acetaminophen (PERCOCET) 10-325 MG tablet Take 1 tablet by mouth every 4 (four) hours as needed for pain. 90 tablet 0  . pantoprazole (PROTONIX) 40 MG tablet Take 1 tablet (40 mg total) by mouth daily. 90 tablet 3  . predniSONE (DELTASONE) 20 MG tablet Take 1 tablet (20 mg total) by mouth daily with breakfast. 5 tablet 0  . VALACYCLOVIR HCL PO Take by mouth. Takes 2  2xday at onset    . zolpidem (AMBIEN) 10 MG tablet TAKE 1 TABLET AT BEDTIME AS NEEDED 30 tablet 2   No current facility-administered medications on file prior to visit.    Allergies    Allergen Reactions  . Influenza Vaccines Nausea And Vomiting  . Cymbalta [Duloxetine Hcl] Swelling   Social History   Social History  . Marital status: Married    Spouse name: N/A  . Number of children: N/A  . Years of education: N/A   Occupational History  . Not on file.   Social History Main Topics  . Smoking status: Never Smoker  . Smokeless tobacco: Never Used  . Alcohol use 0.6 oz/week    1 Glasses of wine per week  . Drug use: No  . Sexual activity: No   Other Topics Concern  . Not on file   Social History Narrative  . No narrative on file   Family History  Problem Relation Age of Onset  . Hypertension Mother   . Cancer Sister   . Stroke Maternal Grandmother      Review of Systems  All other systems reviewed and are negative.      Objective:   Physical Exam  Constitutional: She is oriented to person, place, and time. She appears well-developed and well-nourished. No distress.  HENT:  Head: Normocephalic and atraumatic.  Right Ear: External ear normal.  Left Ear: External ear normal.  Nose: Nose normal.  Mouth/Throat: Oropharynx is clear and moist. No oropharyngeal exudate.  Eyes: Pupils are equal, round, and reactive to light. Conjunctivae and EOM are normal. Right eye exhibits no discharge. Left eye exhibits no discharge. No scleral icterus.  Neck: Normal range of motion. Neck supple. No JVD present. No tracheal deviation present. No thyromegaly present.  Cardiovascular: Normal rate, regular rhythm, normal heart sounds and intact distal pulses.  Exam reveals no gallop and no friction rub.   No murmur heard. Pulmonary/Chest: Effort normal and breath sounds normal. No stridor. No respiratory distress. She has no wheezes. She has no rales. She exhibits no tenderness.  Abdominal: Soft. Bowel sounds are normal. She exhibits no distension and no mass. There is no tenderness. There is no rebound and no guarding.  Musculoskeletal: Normal range of motion. She  exhibits no edema or tenderness.  Lymphadenopathy:    She has no cervical adenopathy.  Neurological: She is alert and oriented to person, place, and time. She has normal reflexes. No cranial nerve deficit. She exhibits normal muscle tone. Coordination normal.  Skin: Skin is warm. No rash noted. She is not diaphoretic. No erythema. No pallor.  Psychiatric: She has a normal mood and affect. Her behavior is normal. Judgment and thought content normal.          Assessment & Plan:  Flu vaccine need - Plan: Flu Vaccine QUAD 36+ mos IM  General  medical exam  Benign essential HTN  Chronic low back pain with sciatica, sciatica laterality unspecified, unspecified back pain laterality  Gastroesophageal reflux disease, esophagitis presence not specified  Arm mass, right - Plan: CT Extrem Up Entire Arm R W/CM  Her blood pressure is elevated however this is the first time it has been elevated and the patient would like to check her blood pressure at home and report the values to me in one weekprior to changing her medication or adding amlodipine. She will schedule her mammogram. She received a flu shot today along with Prevnar 13. She declined the tetanus shot. I did refill her pain medication which she takes for chronic low back pain as outlined in the history of present illness. There is no evidence of abuse or diversion. I will schedule the patient for a CT scan of the right upper extremity to better determine what this mass is and to determine whether surgical excision is necessary.Return fasting for a CBC, CMP, fasting lipid panel, and hepatitis C testing

## 2017-08-20 ENCOUNTER — Other Ambulatory Visit: Payer: Medicare Other

## 2017-08-20 DIAGNOSIS — Z1159 Encounter for screening for other viral diseases: Secondary | ICD-10-CM | POA: Diagnosis not present

## 2017-08-20 DIAGNOSIS — Z0000 Encounter for general adult medical examination without abnormal findings: Secondary | ICD-10-CM | POA: Diagnosis not present

## 2017-08-20 DIAGNOSIS — I10 Essential (primary) hypertension: Secondary | ICD-10-CM | POA: Diagnosis not present

## 2017-08-20 NOTE — Telephone Encounter (Signed)
ok 

## 2017-08-21 ENCOUNTER — Other Ambulatory Visit: Payer: Self-pay | Admitting: Family Medicine

## 2017-08-21 LAB — COMPLETE METABOLIC PANEL WITH GFR
AG RATIO: 1.2 (calc) (ref 1.0–2.5)
ALT: 8 U/L (ref 6–29)
AST: 13 U/L (ref 10–35)
Albumin: 3.8 g/dL (ref 3.6–5.1)
Alkaline phosphatase (APISO): 122 U/L (ref 33–130)
BUN / CREAT RATIO: 17 (calc) (ref 6–22)
BUN: 18 mg/dL (ref 7–25)
CO2: 28 mmol/L (ref 20–32)
Calcium: 9.1 mg/dL (ref 8.6–10.4)
Chloride: 103 mmol/L (ref 98–110)
Creat: 1.09 mg/dL — ABNORMAL HIGH (ref 0.50–0.99)
GFR, EST AFRICAN AMERICAN: 60 mL/min/{1.73_m2} (ref >=60)
GFR, EST NON AFRICAN AMERICAN: 52 mL/min/{1.73_m2} — AB (ref >=60)
GLOBULIN: 3.2 g/dL (ref 1.9–3.7)
GLUCOSE: 82 mg/dL (ref 65–99)
Potassium: 3.9 mmol/L (ref 3.5–5.3)
SODIUM: 139 mmol/L (ref 135–146)
Total Bilirubin: 0.7 mg/dL (ref 0.2–1.2)
Total Protein: 7 g/dL (ref 6.1–8.1)

## 2017-08-21 LAB — CBC WITH DIFFERENTIAL/PLATELET
BASOS ABS: 38 {cells}/uL (ref 0–200)
Basophils Relative: 0.7 %
EOS PCT: 4.1 %
Eosinophils Absolute: 221 cells/uL (ref 15–500)
HCT: 40.1 % (ref 35.0–45.0)
Hemoglobin: 13.2 g/dL (ref 11.7–15.5)
LYMPHS ABS: 1080 {cells}/uL (ref 850–3900)
MCH: 26.8 pg — ABNORMAL LOW (ref 27.0–33.0)
MCHC: 32.9 g/dL (ref 32.0–36.0)
MCV: 81.5 fL (ref 80.0–100.0)
MONOS PCT: 7.2 %
MPV: 11.6 fL (ref 7.5–12.5)
NEUTROS ABS: 3672 {cells}/uL (ref 1500–7800)
NEUTROS PCT: 68 %
Platelets: 217 10*3/uL (ref 140–400)
RBC: 4.92 10*6/uL (ref 3.80–5.10)
RDW: 14.3 % (ref 11.0–15.0)
Total Lymphocyte: 20 %
WBC mixed population: 389 cells/uL (ref 200–950)
WBC: 5.4 10*3/uL (ref 3.8–10.8)

## 2017-08-21 LAB — HEPATITIS C ANTIBODY
Hepatitis C Ab: NONREACTIVE
SIGNAL TO CUT-OFF: 0.01 (ref <1.00)

## 2017-08-21 LAB — LIPID PANEL
Cholesterol: 139 mg/dL (ref <200)
HDL: 74 mg/dL (ref >50)
LDL Cholesterol (Calc): 53 mg/dL (calc)
NON-HDL CHOLESTEROL (CALC): 65 mg/dL (ref <130)
Total CHOL/HDL Ratio: 1.9 (calc) (ref <5.0)
Triglycerides: 50 mg/dL (ref <150)

## 2017-08-21 MED ORDER — ONDANSETRON 8 MG PO TBDP: 8.0000 mg | ORAL_TABLET | Freq: Three times a day (TID) | ORAL | 1 refills | Status: DC | PRN

## 2017-08-21 MED ORDER — CELECOXIB 200 MG PO CAPS: 200.0000 mg | ORAL_CAPSULE | Freq: Every day | ORAL | 1 refills | Status: DC

## 2017-08-21 MED ORDER — MOMETASONE FUROATE 0.1 % EX CREA: 1.0000 "application " | TOPICAL_CREAM | Freq: Every day | CUTANEOUS | 1 refills | Status: DC

## 2017-08-21 MED ORDER — ONDANSETRON 8 MG PO TBDP: 8.0000 mg | ORAL_TABLET | ORAL | 1 refills | Status: DC | PRN

## 2017-08-21 MED ORDER — IPRATROPIUM BROMIDE 0.06 % NA SOLN: 2.0000 | Freq: Four times a day (QID) | NASAL | 1 refills | Status: DC

## 2017-08-21 MED ORDER — PANTOPRAZOLE SODIUM 40 MG PO TBEC: 40.0000 mg | DELAYED_RELEASE_TABLET | Freq: Every day | ORAL | 3 refills | Status: DC

## 2017-08-21 MED ORDER — LOSARTAN POTASSIUM-HCTZ 100-12.5 MG PO TABS: 1.0000 | ORAL_TABLET | Freq: Every day | ORAL | 3 refills | Status: DC

## 2017-08-21 MED ORDER — GABAPENTIN 300 MG PO CAPS: 300.0000 mg | ORAL_CAPSULE | Freq: Three times a day (TID) | ORAL | 1 refills | Status: DC

## 2017-08-21 NOTE — Telephone Encounter (Signed)
Patient is aware rx can be picked up. Her rx's have been refilled as well

## 2017-08-28 ENCOUNTER — Ambulatory Visit
Admission: RE | Admit: 2017-08-28 | Discharge: 2017-08-28 | Disposition: A | Discharge status: Home / Self Care | Payer: Medicare Other | Source: Ambulatory Visit | Attending: Family Medicine | Admitting: Family Medicine

## 2017-08-28 DIAGNOSIS — M799 Soft tissue disorder, unspecified: Secondary | ICD-10-CM | POA: Diagnosis not present

## 2017-08-28 DIAGNOSIS — R2231 Localized swelling, mass and lump, right upper limb: Secondary | ICD-10-CM

## 2017-08-28 MED ORDER — IOPAMIDOL (ISOVUE-300) INJECTION 61%
100.0000 mL | Freq: Once | INTRAVENOUS | Status: AC | PRN
  Administered 2017-08-28: 100 mL via INTRAVENOUS

## 2017-08-29 ENCOUNTER — Other Ambulatory Visit: Payer: Self-pay

## 2017-09-01 ENCOUNTER — Other Ambulatory Visit: Payer: Self-pay | Admitting: Family Medicine

## 2017-09-01 DIAGNOSIS — R2231 Localized swelling, mass and lump, right upper limb: Secondary | ICD-10-CM

## 2017-09-10 ENCOUNTER — Ambulatory Visit: Payer: Self-pay | Admitting: General Surgery

## 2017-09-10 DIAGNOSIS — C4911 Malignant neoplasm of connective and soft tissue of right upper limb, including shoulder: Secondary | ICD-10-CM | POA: Diagnosis not present

## 2017-09-12 ENCOUNTER — Encounter (HOSPITAL_COMMUNITY): Payer: Self-pay | Admitting: *Deleted

## 2017-09-12 NOTE — Progress Notes (Signed)
Spoke with pt for pre-op call. Pt denies cardiac history or chest pain. States she is not diabetic.

## 2017-09-15 ENCOUNTER — Encounter (HOSPITAL_COMMUNITY)
Admission: RE | Disposition: A | Discharge status: Home / Self Care | Payer: Self-pay | Source: Ambulatory Visit | Attending: General Surgery

## 2017-09-15 ENCOUNTER — Encounter (HOSPITAL_COMMUNITY): Payer: Self-pay

## 2017-09-15 ENCOUNTER — Ambulatory Visit (HOSPITAL_COMMUNITY): Payer: Medicare Other | Admitting: Anesthesiology

## 2017-09-15 ENCOUNTER — Ambulatory Visit (HOSPITAL_COMMUNITY)
Admission: RE | Admit: 2017-09-15 | Discharge: 2017-09-15 | Disposition: A | Discharge status: Home / Self Care | Payer: Medicare Other | Source: Ambulatory Visit | Attending: General Surgery | Admitting: General Surgery

## 2017-09-15 ENCOUNTER — Other Ambulatory Visit: Payer: Self-pay

## 2017-09-15 DIAGNOSIS — Z820 Family history of epilepsy and other diseases of the nervous system: Secondary | ICD-10-CM | POA: Insufficient documentation

## 2017-09-15 DIAGNOSIS — Z9049 Acquired absence of other specified parts of digestive tract: Secondary | ICD-10-CM | POA: Diagnosis not present

## 2017-09-15 DIAGNOSIS — Z8261 Family history of arthritis: Secondary | ICD-10-CM | POA: Insufficient documentation

## 2017-09-15 DIAGNOSIS — Z836 Family history of other diseases of the respiratory system: Secondary | ICD-10-CM | POA: Insufficient documentation

## 2017-09-15 DIAGNOSIS — Z79891 Long term (current) use of opiate analgesic: Secondary | ICD-10-CM | POA: Insufficient documentation

## 2017-09-15 DIAGNOSIS — Z885 Allergy status to narcotic agent status: Secondary | ICD-10-CM | POA: Insufficient documentation

## 2017-09-15 DIAGNOSIS — J45909 Unspecified asthma, uncomplicated: Secondary | ICD-10-CM | POA: Diagnosis not present

## 2017-09-15 DIAGNOSIS — Z79899 Other long term (current) drug therapy: Secondary | ICD-10-CM | POA: Insufficient documentation

## 2017-09-15 DIAGNOSIS — M549 Dorsalgia, unspecified: Secondary | ICD-10-CM | POA: Insufficient documentation

## 2017-09-15 DIAGNOSIS — Z90710 Acquired absence of both cervix and uterus: Secondary | ICD-10-CM | POA: Insufficient documentation

## 2017-09-15 DIAGNOSIS — I10 Essential (primary) hypertension: Secondary | ICD-10-CM | POA: Insufficient documentation

## 2017-09-15 DIAGNOSIS — R2231 Localized swelling, mass and lump, right upper limb: Secondary | ICD-10-CM | POA: Insufficient documentation

## 2017-09-15 DIAGNOSIS — M7989 Other specified soft tissue disorders: Secondary | ICD-10-CM | POA: Diagnosis not present

## 2017-09-15 DIAGNOSIS — Z888 Allergy status to other drugs, medicaments and biological substances status: Secondary | ICD-10-CM | POA: Diagnosis not present

## 2017-09-15 DIAGNOSIS — C4911 Malignant neoplasm of connective and soft tissue of right upper limb, including shoulder: Secondary | ICD-10-CM | POA: Diagnosis not present

## 2017-09-15 DIAGNOSIS — Z9889 Other specified postprocedural states: Secondary | ICD-10-CM | POA: Insufficient documentation

## 2017-09-15 DIAGNOSIS — Z8249 Family history of ischemic heart disease and other diseases of the circulatory system: Secondary | ICD-10-CM | POA: Insufficient documentation

## 2017-09-15 DIAGNOSIS — D1801 Hemangioma of skin and subcutaneous tissue: Secondary | ICD-10-CM | POA: Diagnosis not present

## 2017-09-15 DIAGNOSIS — K219 Gastro-esophageal reflux disease without esophagitis: Secondary | ICD-10-CM | POA: Diagnosis not present

## 2017-09-15 HISTORY — PX: MELANOMA EXCISION: SHX5266

## 2017-09-15 HISTORY — DX: Anemia, unspecified: D64.9

## 2017-09-15 HISTORY — DX: Gastro-esophageal reflux disease without esophagitis: K21.9

## 2017-09-15 LAB — CBC
HEMATOCRIT: 42.7 % (ref 36.0–46.0)
Hemoglobin: 13.7 g/dL (ref 12.0–15.0)
MCH: 27.6 pg (ref 26.0–34.0)
MCHC: 32.1 g/dL (ref 30.0–36.0)
MCV: 85.9 fL (ref 78.0–100.0)
Platelets: 209 10*3/uL (ref 150–400)
RBC: 4.97 MIL/uL (ref 3.87–5.11)
RDW: 15.4 % (ref 11.5–15.5)
WBC: 5.2 10*3/uL (ref 4.0–10.5)

## 2017-09-15 LAB — BASIC METABOLIC PANEL
Anion gap: 9 (ref 5–15)
BUN: 23 mg/dL — AB (ref 6–20)
CO2: 25 mmol/L (ref 22–32)
Calcium: 8.8 mg/dL — ABNORMAL LOW (ref 8.9–10.3)
Chloride: 104 mmol/L (ref 101–111)
Creatinine, Ser: 1.13 mg/dL — ABNORMAL HIGH (ref 0.44–1.00)
GFR calc Af Amer: 57 mL/min — ABNORMAL LOW (ref >60)
GFR, EST NON AFRICAN AMERICAN: 49 mL/min — AB (ref >60)
GLUCOSE: 79 mg/dL (ref 65–99)
POTASSIUM: 4 mmol/L (ref 3.5–5.1)
Sodium: 138 mmol/L (ref 135–145)

## 2017-09-15 SURGERY — EXCISION, MELANOMA
Anesthesia: General | Site: Arm Upper | Laterality: Right

## 2017-09-15 MED ORDER — CHLORHEXIDINE GLUCONATE CLOTH 2 % EX PADS: 6.0000 | MEDICATED_PAD | Freq: Once | CUTANEOUS | Status: DC

## 2017-09-15 MED ORDER — CEFAZOLIN SODIUM-DEXTROSE 2-4 GM/100ML-% IV SOLN
INTRAVENOUS | Status: AC
  Filled 2017-09-15: qty 100

## 2017-09-15 MED ORDER — MIDAZOLAM HCL 2 MG/2ML IJ SOLN
INTRAMUSCULAR | Status: DC | PRN
  Administered 2017-09-15: 2 mg via INTRAVENOUS

## 2017-09-15 MED ORDER — FENTANYL CITRATE (PF) 250 MCG/5ML IJ SOLN
INTRAMUSCULAR | Status: AC
Start: 2017-09-15 — End: ?
  Filled 2017-09-15: qty 5

## 2017-09-15 MED ORDER — MIDAZOLAM HCL 2 MG/2ML IJ SOLN
INTRAMUSCULAR | Status: AC
  Filled 2017-09-15: qty 2

## 2017-09-15 MED ORDER — LACTATED RINGERS IV SOLN
INTRAVENOUS | Status: DC | PRN
  Administered 2017-09-15: 11:00:00 via INTRAVENOUS

## 2017-09-15 MED ORDER — ONDANSETRON HCL 4 MG/2ML IJ SOLN
INTRAMUSCULAR | Status: AC
  Filled 2017-09-15: qty 2

## 2017-09-15 MED ORDER — DEXAMETHASONE SODIUM PHOSPHATE 10 MG/ML IJ SOLN
INTRAMUSCULAR | Status: AC
  Filled 2017-09-15: qty 1

## 2017-09-15 MED ORDER — BUPIVACAINE-EPINEPHRINE 0.25% -1:200000 IJ SOLN
INTRAMUSCULAR | Status: DC | PRN
  Administered 2017-09-15: 10 mL

## 2017-09-15 MED ORDER — CEFAZOLIN SODIUM-DEXTROSE 2-4 GM/100ML-% IV SOLN
2.0000 g | INTRAVENOUS | Status: AC
  Administered 2017-09-15: 2 g via INTRAVENOUS

## 2017-09-15 MED ORDER — ONDANSETRON HCL 4 MG/2ML IJ SOLN
INTRAMUSCULAR | Status: DC | PRN
  Administered 2017-09-15: 4 mg via INTRAVENOUS

## 2017-09-15 MED ORDER — METOCLOPRAMIDE HCL 5 MG/ML IJ SOLN: 10.0000 mg | Freq: Once | INTRAMUSCULAR | Status: DC | PRN

## 2017-09-15 MED ORDER — EPHEDRINE 5 MG/ML INJ
INTRAVENOUS | Status: AC
  Filled 2017-09-15: qty 10

## 2017-09-15 MED ORDER — HYDROCODONE-ACETAMINOPHEN 7.5-325 MG PO TABS: 1.0000 | ORAL_TABLET | Freq: Once | ORAL | Status: DC | PRN

## 2017-09-15 MED ORDER — MEPERIDINE HCL 25 MG/ML IJ SOLN: 6.2500 mg | INTRAMUSCULAR | Status: DC | PRN

## 2017-09-15 MED ORDER — LIDOCAINE 2% (20 MG/ML) 5 ML SYRINGE
INTRAMUSCULAR | Status: AC
  Filled 2017-09-15: qty 5

## 2017-09-15 MED ORDER — LACTATED RINGERS IV SOLN
INTRAVENOUS | Status: DC
  Administered 2017-09-15: 10:00:00 via INTRAVENOUS

## 2017-09-15 MED ORDER — FENTANYL CITRATE (PF) 100 MCG/2ML IJ SOLN
INTRAMUSCULAR | Status: DC | PRN
  Administered 2017-09-15: 25 ug via INTRAVENOUS

## 2017-09-15 MED ORDER — 0.9 % SODIUM CHLORIDE (POUR BTL) OPTIME
TOPICAL | Status: DC | PRN
  Administered 2017-09-15: 1000 mL

## 2017-09-15 MED ORDER — PROPOFOL 10 MG/ML IV BOLUS
INTRAVENOUS | Status: DC | PRN
  Administered 2017-09-15: 100 mg via INTRAVENOUS
  Administered 2017-09-15: 50 mg via INTRAVENOUS

## 2017-09-15 MED ORDER — FENTANYL CITRATE (PF) 100 MCG/2ML IJ SOLN: 25.0000 ug | INTRAMUSCULAR | Status: DC | PRN

## 2017-09-15 MED ORDER — DEXAMETHASONE SODIUM PHOSPHATE 10 MG/ML IJ SOLN
INTRAMUSCULAR | Status: DC | PRN
  Administered 2017-09-15: 10 mg via INTRAVENOUS

## 2017-09-15 MED ORDER — LIDOCAINE 2% (20 MG/ML) 5 ML SYRINGE
INTRAMUSCULAR | Status: DC | PRN
  Administered 2017-09-15: 60 mg via INTRAVENOUS

## 2017-09-15 MED ORDER — BUPIVACAINE-EPINEPHRINE (PF) 0.25% -1:200000 IJ SOLN
INTRAMUSCULAR | Status: AC
  Filled 2017-09-15: qty 30

## 2017-09-15 MED ORDER — EPHEDRINE SULFATE-NACL 50-0.9 MG/10ML-% IV SOSY
PREFILLED_SYRINGE | INTRAVENOUS | Status: DC | PRN
  Administered 2017-09-15: 5 mg via INTRAVENOUS
  Administered 2017-09-15: 10 mg via INTRAVENOUS

## 2017-09-15 MED ORDER — PROPOFOL 10 MG/ML IV BOLUS
INTRAVENOUS | Status: AC
  Filled 2017-09-15: qty 40

## 2017-09-15 SURGICAL SUPPLY — 53 items
BENZOIN TINCTURE PRP APPL 2/3 (GAUZE/BANDAGES/DRESSINGS) IMPLANT
BLADE CLIPPER SURG (BLADE) IMPLANT
BNDG GAUZE ELAST 4 BULKY (GAUZE/BANDAGES/DRESSINGS) IMPLANT
CANISTER SUCT 3000ML PPV (MISCELLANEOUS) IMPLANT
CHLORAPREP W/TINT 26ML (MISCELLANEOUS) ×3 IMPLANT
CLOSURE WOUND 1/2 X4 (GAUZE/BANDAGES/DRESSINGS)
COVER SURGICAL LIGHT HANDLE (MISCELLANEOUS) ×3 IMPLANT
DECANTER SPIKE VIAL GLASS SM (MISCELLANEOUS) ×3 IMPLANT
DERMABOND ADVANCED (GAUZE/BANDAGES/DRESSINGS) ×2
DERMABOND ADVANCED .7 DNX12 (GAUZE/BANDAGES/DRESSINGS) ×1 IMPLANT
DRAPE LAPAROTOMY T 102X78X121 (DRAPES) ×3 IMPLANT
DRAPE ORTHO SPLIT 77X108 STRL (DRAPES)
DRAPE SURG ORHT 6 SPLT 77X108 (DRAPES) IMPLANT
DRAPE UTILITY XL STRL (DRAPES) ×6 IMPLANT
ELECT CAUTERY BLADE 6.4 (BLADE) ×3 IMPLANT
ELECT REM PT RETURN 9FT ADLT (ELECTROSURGICAL) ×3
ELECTRODE REM PT RTRN 9FT ADLT (ELECTROSURGICAL) ×1 IMPLANT
GAUZE SPONGE 4X4 12PLY STRL (GAUZE/BANDAGES/DRESSINGS) IMPLANT
GLOVE BIO SURGEON STRL SZ8 (GLOVE) ×3 IMPLANT
GLOVE BIOGEL PI IND STRL 8 (GLOVE) ×1 IMPLANT
GLOVE BIOGEL PI INDICATOR 8 (GLOVE) ×2
GOWN STRL REUS W/ TWL LRG LVL3 (GOWN DISPOSABLE) ×1 IMPLANT
GOWN STRL REUS W/ TWL XL LVL3 (GOWN DISPOSABLE) ×1 IMPLANT
GOWN STRL REUS W/TWL LRG LVL3 (GOWN DISPOSABLE) ×2
GOWN STRL REUS W/TWL XL LVL3 (GOWN DISPOSABLE) ×2
KIT BASIN OR (CUSTOM PROCEDURE TRAY) ×3 IMPLANT
KIT ROOM TURNOVER OR (KITS) ×3 IMPLANT
NEEDLE 22X1 1/2 (OR ONLY) (NEEDLE) ×3 IMPLANT
NS IRRIG 1000ML POUR BTL (IV SOLUTION) ×3 IMPLANT
PACK SURGICAL SETUP 50X90 (CUSTOM PROCEDURE TRAY) ×3 IMPLANT
PAD ARMBOARD 7.5X6 YLW CONV (MISCELLANEOUS) ×3 IMPLANT
PENCIL BUTTON HOLSTER BLD 10FT (ELECTRODE) ×3 IMPLANT
SPONGE LAP 18X18 X RAY DECT (DISPOSABLE) ×3 IMPLANT
STAPLER VISISTAT 35W (STAPLE) IMPLANT
STOCKINETTE IMPERVIOUS LG (DRAPES) IMPLANT
STRIP CLOSURE SKIN 1/2X4 (GAUZE/BANDAGES/DRESSINGS) IMPLANT
SUT ETHILON 3 0 FSL (SUTURE) IMPLANT
SUT ETHILON 4 0 PS 2 18 (SUTURE) IMPLANT
SUT MON AB 4-0 PC3 18 (SUTURE) IMPLANT
SUT SILK 2 0 SH (SUTURE) ×3 IMPLANT
SUT VIC AB 2-0 SH 27 (SUTURE) ×2
SUT VIC AB 2-0 SH 27X BRD (SUTURE) ×1 IMPLANT
SUT VIC AB 3-0 SH 27 (SUTURE) ×4
SUT VIC AB 3-0 SH 27XBRD (SUTURE) ×2 IMPLANT
SUT VIC AB 4-0 PS2 18 (SUTURE) ×3 IMPLANT
SYR BULB 3OZ (MISCELLANEOUS) ×3 IMPLANT
SYR CONTROL 10ML LL (SYRINGE) ×3 IMPLANT
TOWEL OR 17X24 6PK STRL BLUE (TOWEL DISPOSABLE) ×3 IMPLANT
TOWEL OR 17X26 10 PK STRL BLUE (TOWEL DISPOSABLE) ×3 IMPLANT
TUBE CONNECTING 12'X1/4 (SUCTIONS)
TUBE CONNECTING 12X1/4 (SUCTIONS) IMPLANT
UNDERPAD 30X30 (UNDERPADS AND DIAPERS) ×3 IMPLANT
YANKAUER SUCT BULB TIP NO VENT (SUCTIONS) IMPLANT

## 2017-09-15 NOTE — Interval H&P Note (Signed)
History and Physical Interval Note:  09/15/2017 11:44 AM  Faith Reeves  has presented today for surgery, with the diagnosis of liposarcoma right arm  The various methods of treatment have been discussed with the patient and family. After consideration of risks, benefits and other options for treatment, the patient has consented to  Procedure(s): EXCISION LIPOSARCOMA RIGHT ARM (Right) as a surgical intervention .  The patient's history has been reviewed, patient examined, no change in status, stable for surgery.  I have reviewed the patient's chart and labs.  Questions were answered to the patient's satisfaction.     Lum Stillinger E

## 2017-09-15 NOTE — H&P (Signed)
Faith Reeves 09/10/2017 11:52 AM Location: [facility] Patient #: [id number] DOB: 06-16-1951 Married / Language: Faith Reeves / Race: White Female   History of Present Illness Faith Neri E. Faith Reeves; 09/10/2017 12:04 PM) The patient is a 66 year old female who presents with a complaint of Mass. Dr. Dennard Schaumann Asked me to see Faith Reeves in regards to a mass on her right arm. She noticed it a couple months ago and occasionally has some discomfort in the area. She initially thought it just came about from doing some heavy work in the garden. Additionally, unfortunately, her 38 year old son had an aneurysm and associated stroke earlier this year so she has been taking care of him at her house as well. The mass persisted and was uncomfortable so Dr. Dennard Schaumann ordered a CT scan. It demonstrated a 3 cm mass subcutaneously in her right arm with features suspicious for a liposarcoma. No other complicating aspects were noted. She denies direct trauma to the area and has had no skin changes. Discomfort is localized to the area without radiation or exacerbating features.   Past Surgical History Faith Reeves; 09/10/2017 11:52 AM) Appendectomy  Foot Surgery  Bilateral. Gallbladder Surgery - Open  Hip Surgery  Left. Hysterectomy (not due to cancer) - Partial  Spinal Surgery - Lower Back   Diagnostic Studies History Faith Reeves; 09/10/2017 11:52 AM) Colonoscopy  5-10 years ago Mammogram  1-3 years ago  Allergies Faith Reeves; 09/10/2017 11:53 AM) Cymbalta *ANTIDEPRESSANTS*   Medication History Faith Reeves; 09/10/2017 11:54 AM) Sertraline HCl (50MG  Tablet, Oral) Active. Celecoxib (200MG  Capsule, Oral) Active. Gabapentin (300MG  Capsule, Oral) Active. Oxycodone-Acetaminophen (10-325MG  Tablet, Oral) Active. Losartan Potassium-HCTZ (100-12.5MG  Tablet, Oral) Active. Pantoprazole Sodium (40MG  Tablet DR, Oral) Active. Zolpidem Tartrate (10MG  Tablet, Oral)  Active. Medications Reconciled  Social History Faith Reeves; 09/10/2017 11:52 AM) Caffeine use  Coffee, Tea. Tobacco use  Never smoker.  Family History Faith Reeves; 09/10/2017 11:52 AM) Arthritis  Mother. Hypertension  Mother. Migraine Headache  Mother. Respiratory Condition  Father.  Pregnancy / Birth History Faith Core, [place]; 09/10/2017 11:52 AM) Age at menarche  65 years. Age of menopause  7-50 Gravida  4 Para  3  Other Problems (Faith Reeves; 09/10/2017 11:52 AM) Back Pain  High blood pressure     Review of Systems (Faith Reeves; 09/10/2017 11:52 AM) General Not Present- Appetite Loss, Chills, Fatigue, Fever, Night Sweats, Weight Gain and Weight Loss. Skin Present- Rash. Not Present- Change in Wart/Mole, Dryness, Hives, Jaundice, New Lesions, Non-Healing Wounds and Ulcer. HEENT Present- Seasonal Allergies. Not Present- Earache, Hearing Loss, Hoarseness, Nose Bleed, Oral Ulcers, Ringing in the Ears, Sinus Pain, Sore Throat, Visual Disturbances, Wears glasses/contact lenses and Yellow Eyes. Respiratory Present- Snoring. Not Present- Bloody sputum, Chronic Cough, Difficulty Breathing and Wheezing. Breast Not Present- Breast Mass, Breast Pain, Nipple Discharge and Skin Changes. Cardiovascular Present- Shortness of Breath. Not Present- Chest Pain, Difficulty Breathing Lying Down, Leg Cramps, Palpitations, Rapid Heart Rate and Swelling of Extremities. Gastrointestinal Not Present- Abdominal Pain, Bloating, Bloody Stool, Change in Bowel Habits, Chronic diarrhea, Constipation, Difficulty Swallowing, Excessive gas, Gets full quickly at meals, Hemorrhoids, Indigestion, Nausea, Rectal Pain and Vomiting. Female Genitourinary Not Present- Frequency, Nocturia, Painful Urination, Pelvic Pain and Urgency. Musculoskeletal Present- Back Pain, Joint Pain and Joint Stiffness. Not Present- Muscle Pain, Muscle Weakness and Swelling of Extremities. Neurological Present-  Headaches and Trouble walking. Not Present- Decreased Memory, Fainting, Numbness, Seizures, Tingling, Tremor and Weakness. Psychiatric Not Present- Anxiety,  Bipolar, Change in Sleep Pattern, Depression, Fearful and Frequent crying. Endocrine Not Present- Cold Intolerance, Excessive Hunger, Hair Changes, Heat Intolerance, Hot flashes and New Diabetes. Hematology Not Present- Blood Thinners, Easy Bruising, Excessive bleeding, Gland problems, HIV and Persistent Infections.  Vitals (Faith Reeves; 09/10/2017 11:53 AM) 09/10/2017 11:53 AM Weight: 227.38 lb Height: 59in Body Surface Area: 1.95 m Body Mass Index: 45.92 kg/m  Temp.: 98.94F  Pulse: 77 (Regular)  P.OX: 95% (Room air) BP: 128/84 (Sitting, Left Arm, Standard)       Physical Exam Faith Neri E. Faith Reeves; 09/10/2017 12:05 PM) General Mental Status-Alert. General Appearance-Consistent with stated age. Hydration-Well hydrated. Voice-Normal.  Head and Neck Head-normocephalic, atraumatic with no lesions or palpable masses. Trachea-midline. Thyroid Gland Characteristics - normal size and consistency.  Eye Eyeball - Bilateral-Extraocular movements intact. Sclera/Conjunctiva - Bilateral-No scleral icterus.  Chest and Lung Exam Chest and lung exam reveals -quiet, even and easy respiratory effort with no use of accessory muscles and on auscultation, normal breath sounds, no adventitious sounds and normal vocal resonance. Inspection Chest Wall - Normal. Back - normal.  Cardiovascular Cardiovascular examination reveals -normal heart sounds, regular rate and rhythm with no murmurs and normal pedal pulses bilaterally.  Abdomen Palpation/Percussion Palpation and Percussion of the abdomen reveal - Soft, Non Tender, No Rebound tenderness, No Rigidity (guarding) and No hepatosplenomegaly. Auscultation Auscultation of the abdomen reveals - Bowel sounds normal.  Neurologic Neurologic evaluation reveals  -alert and oriented x 3 with no impairment of recent or remote memory. Mental Status-Normal.  Musculoskeletal Global Assessment -Note: no gross deformities.  Normal Exam - Left-Upper Extremity Strength Normal and Lower Extremity Strength Normal. Normal Exam - Right-Upper Extremity Strength Normal and Lower Extremity Strength Normal. Note: 3 cm subcutaneous mass palpable proximal right arm over her biceps area, it is mobile but very firm   Lymphatic Head & Neck  General Head & Neck Lymphatics: Bilateral - Description - Normal. Axillary  General Axillary Region: Bilateral - Description - Normal. Tenderness - Non Tender. Femoral & Inguinal  Generalized Femoral & Inguinal Lymphatics: Bilateral - Description - No Generalized lymphadenopathy. Note: No cervical, supraclavicular, nor bilateral axillary lymphadenopathy     Assessment & Plan Faith Neri E. Faith Reeves; 09/10/2017 12:06 PM) LIPOSARCOMA OF UPPER EXTREMITY, RIGHT (C49.11) Impression: I have offered wide excision under general anesthesia as an outpatient procedure. Procedure, risks, and benefits were discussed with her in detail and she is agreeable. I discussed the expected postoperative course and her need to avoid heavy lifting for 4 weeks after surgery. I answered her questions and we look forward to scheduling in the near future.

## 2017-09-15 NOTE — Anesthesia Procedure Notes (Signed)
Procedure Name: LMA Insertion Date/Time: 09/15/2017 12:19 PM Performed by: Harden Mo, CRNA Pre-anesthesia Checklist: Patient identified, Emergency Drugs available, Suction available and Patient being monitored Patient Re-evaluated:Patient Re-evaluated prior to induction Oxygen Delivery Method: Circle System Utilized Preoxygenation: Pre-oxygenation with 100% oxygen Induction Type: IV induction LMA: LMA inserted LMA Size: 4.0 Number of attempts: 1 Airway Equipment and Method: Bite block Placement Confirmation: positive ETCO2 Tube secured with: Tape Dental Injury: Teeth and Oropharynx as per pre-operative assessment

## 2017-09-15 NOTE — Transfer of Care (Signed)
Immediate Anesthesia Transfer of Care Note  Patient: Faith Reeves  Procedure(s) Performed: EXCISION LIPOSARCOMA RIGHT ARM (Right Arm Upper)  Patient Location: PACU  Anesthesia Type:General  Level of Consciousness: awake, alert  and oriented  Airway & Oxygen Therapy: Patient Spontanous Breathing  Post-op Assessment: Report given to RN, Post -op Vital signs reviewed and stable and Patient moving all extremities X 4  Post vital signs: Reviewed and stable  Last Vitals:  Vitals:   09/15/17 1027  BP: (!) 158/89  Pulse: 73  Resp: 20  Temp: 36.8 C  SpO2: 100%    Last Pain:  Vitals:   09/15/17 1027  TempSrc: Oral         Complications: No apparent anesthesia complications

## 2017-09-15 NOTE — Anesthesia Preprocedure Evaluation (Addendum)
Anesthesia Evaluation  Patient identified by MRN, date of birth, ID band Patient awake    Reviewed: Allergy & Precautions, NPO status , Patient's Chart, lab work & pertinent test results  Airway Mallampati: II  TM Distance: >3 FB Neck ROM: Full    Dental  (+) Caps   Pulmonary asthma ,    Pulmonary exam normal breath sounds clear to auscultation       Cardiovascular hypertension, Pt. on medications Normal cardiovascular exam Rhythm:Regular Rate:Normal     Neuro/Psych Anxiety negative neurological ROS     GI/Hepatic Neg liver ROS, GERD  Medicated and Controlled,  Endo/Other  Morbid obesity  Renal/GU Renal InsufficiencyRenal disease  negative genitourinary   Musculoskeletal  (+) Arthritis , Liposarcoma right upper extremity   Abdominal (+) + obese,   Peds  Hematology  (+) anemia ,   Anesthesia Other Findings   Reproductive/Obstetrics                            Anesthesia Physical Anesthesia Plan  ASA: III  Anesthesia Plan: General   Post-op Pain Management:    Induction: Intravenous  PONV Risk Score and Plan: 3 and Ondansetron, Dexamethasone, Metaclopromide and Treatment may vary due to age or medical condition  Airway Management Planned: LMA  Additional Equipment:   Intra-op Plan:   Post-operative Plan: Extubation in OR  Informed Consent: I have reviewed the patients History and Physical, chart, labs and discussed the procedure including the risks, benefits and alternatives for the proposed anesthesia with the patient or authorized representative who has indicated his/her understanding and acceptance.   Dental advisory given  Plan Discussed with: Anesthesiologist, Surgeon and CRNA  Anesthesia Plan Comments:         Anesthesia Quick Evaluation

## 2017-09-15 NOTE — Anesthesia Postprocedure Evaluation (Signed)
Anesthesia Post Note  Patient: Faith Reeves  Procedure(s) Performed: EXCISION LIPOSARCOMA RIGHT ARM (Right Arm Upper)     Patient location during evaluation: PACU Anesthesia Type: General Level of consciousness: awake and alert and oriented Pain management: pain level controlled Vital Signs Assessment: post-procedure vital signs reviewed and stable Respiratory status: spontaneous breathing, nonlabored ventilation and respiratory function stable Cardiovascular status: blood pressure returned to baseline and stable Postop Assessment: no apparent nausea or vomiting Anesthetic complications: no    Last Vitals:  Vitals:   09/15/17 1330 09/15/17 1341  BP:    Pulse: 73   Resp: 13   Temp:  (!) 36.4 C  SpO2: 96%     Last Pain:  Vitals:   09/15/17 1027  TempSrc: Oral                 Carmell Elgin A.

## 2017-09-15 NOTE — Op Note (Signed)
09/15/2017  1:10 PM  PATIENT:  Faith Reeves  66 y.o. female  PRE-OPERATIVE DIAGNOSIS:  liposarcoma right arm  POST-OPERATIVE DIAGNOSIS:  liposarcoma right arm  PROCEDURE:  Procedure(s): EXCISION LIPOSARCOMA RIGHT ARM 3x2CM WITH LAYERED CLOSURE  SURGEON:  Surgeon(s): Georganna Skeans, MD  ASSISTANTS: none   ANESTHESIA:   local and general  EBL:  Total I/O In: 400 [I.V.:400] Out: 30 [Blood:30]  BLOOD ADMINISTERED:none  DRAINS: none   SPECIMEN:  Excision  DISPOSITION OF SPECIMEN:  PATHOLOGY  COUNTS:  YES  DICTATION: .Dragon Dictation Findings: 3 cm subcutaneous mass with some areas of bluish black discoloration  Procedure in detail: [name] presents for excision of suspected liposarcoma right anterior arm.  She was identified in the preop holding area.  She received intravenous antibiotics.  Informed consent was obtained.  She was brought to the operating room and general anesthesia was administered by the anesthesia staff.  Her arm was prepped and draped in sterile fashion.  A linear incision was made over the top of this palpable mass.  Subcutaneous tissues were dissected down around the mass including a good margin of adipose tissue.  A superficial vein was ligated with 3-0 Vicryl.  The remainder the mass was excised including a margin of tissue above the underlying fascia.  The specimen was marked for orientation for pathology and it was sent.  I changed my gloves.  Local anesthetic was injected in the subcutaneous areas around the incision.  The incision was then closed with layers.  First, subcutaneous tissues were approximated with interrupted 3-0 Vicryl sutures.  Then the skin was closed with running 4-0 Vicryl subcuticular followed by Dermabond.  All counts were correct.  She tolerated procedure well without apparent complication and was taken recovery in stable condition. PATIENT DISPOSITION:  PACU - hemodynamically stable.   Delay start of Pharmacological VTE agent  (>24hrs) due to surgical blood loss or risk of bleeding:  no  Georganna Skeans, MD, MPH, FACS Pager: [phone]  11/12/20181:10 PM

## 2017-09-16 ENCOUNTER — Encounter (HOSPITAL_COMMUNITY): Payer: Self-pay | Admitting: General Surgery

## 2017-10-22 ENCOUNTER — Other Ambulatory Visit: Payer: Self-pay | Admitting: Family Medicine

## 2017-10-29 ENCOUNTER — Other Ambulatory Visit: Payer: Self-pay | Admitting: Family Medicine

## 2017-10-30 NOTE — Telephone Encounter (Signed)
Requesting refill      LOV: 08/18/17  LRF:  07/31/17  #30/2 RF

## 2017-11-20 DIAGNOSIS — H40033 Anatomical narrow angle, bilateral: Secondary | ICD-10-CM | POA: Diagnosis not present

## 2017-11-20 DIAGNOSIS — H40013 Open angle with borderline findings, low risk, bilateral: Secondary | ICD-10-CM | POA: Diagnosis not present

## 2017-11-20 DIAGNOSIS — H2513 Age-related nuclear cataract, bilateral: Secondary | ICD-10-CM | POA: Diagnosis not present

## 2017-11-20 DIAGNOSIS — H353131 Nonexudative age-related macular degeneration, bilateral, early dry stage: Secondary | ICD-10-CM | POA: Diagnosis not present

## 2017-11-21 ENCOUNTER — Ambulatory Visit (INDEPENDENT_AMBULATORY_CARE_PROVIDER_SITE_OTHER): Payer: Medicare Other | Admitting: Family Medicine

## 2017-11-21 ENCOUNTER — Encounter: Payer: Self-pay | Admitting: Family Medicine

## 2017-11-21 VITALS — BP 134/88 | HR 60 | Temp 98.5°F | Resp 18 | Ht 59.0 in | Wt 234.0 lb

## 2017-11-21 DIAGNOSIS — J329 Chronic sinusitis, unspecified: Secondary | ICD-10-CM

## 2017-11-21 MED ORDER — AMOXICILLIN-POT CLAVULANATE 875-125 MG PO TABS: 1.0000 | ORAL_TABLET | Freq: Two times a day (BID) | ORAL | 0 refills | Status: DC

## 2017-11-21 MED ORDER — ZOLPIDEM TARTRATE 10 MG PO TABS: 10.0000 mg | ORAL_TABLET | Freq: Every evening | ORAL | 2 refills | Status: DC | PRN

## 2017-11-21 MED ORDER — OXYCODONE-ACETAMINOPHEN 10-325 MG PO TABS: 1.0000 | ORAL_TABLET | ORAL | 0 refills | Status: DC | PRN

## 2017-11-21 MED ORDER — BENZONATATE 100 MG PO CAPS: 200.0000 mg | ORAL_CAPSULE | Freq: Three times a day (TID) | ORAL | 0 refills | Status: DC | PRN

## 2017-11-21 NOTE — Progress Notes (Signed)
Subjective:    Patient ID: Faith Reeves, female    DOB: [birth year], 67 y.o.   MRN: [id number]  HPI Patient is a very pleasant 67 year old female who presents with a one-week history of sore throat. She states that it was getting better however over the last 2-3 days, her postnasal drip has worsened. She is developing pain and pressure in her left maxillary sinus as well as in her frontal sinuses. She is also starting to develop pain in her upper teeth despite not having any dental problems. She reports postnasal drip causing a tickle cough in her throat that will not stop. It is also causing a sore scratchy throat. She denies any fevers. She does report a dull sinus headache Past Medical History:  Diagnosis Date  . Allergy   . Anemia   . Anxiety   . Blood transfusion without reported diagnosis   . Cataract   . GERD (gastroesophageal reflux disease)   . Hypertension   . Macular degeneration    Past Surgical History:  Procedure Laterality Date  . ABDOMINAL HYSTERECTOMY    . ACHILLES TENDON REPAIR Bilateral   . APPENDECTOMY    . BACK SURGERY    . BILATERAL CARPAL TUNNEL RELEASE    . CHOLECYSTECTOMY    . COLONOSCOPY    . HERNIA REPAIR    . JOINT REPLACEMENT    . MELANOMA EXCISION Right 09/15/2017   Procedure: EXCISION LIPOSARCOMA RIGHT ARM;  Surgeon: Georganna Skeans, MD;  Location: [facility];  Service: General;  Laterality: Right;  . SPINE SURGERY     Current Outpatient Medications on File Prior to Visit  Medication Sig Dispense Refill  . albuterol (PROVENTIL HFA;VENTOLIN HFA) 108 (90 Base) MCG/ACT inhaler Inhale 2 puffs into the lungs every 4 (four) hours as needed for wheezing or shortness of breath. 1 Inhaler 0  . amoxicillin (AMOXIL) 500 MG capsule Take 2,000 mg by mouth once. Prior to dental work    . celecoxib (CELEBREX) 200 MG capsule TAKE 1 CAPSULE BY MOUTH EVERY DAY 90 capsule 3  . gabapentin (NEURONTIN) 300 MG capsule Take 1 capsule (300 mg total) by mouth 3 (three) times  daily. 90 capsule 1  . ipratropium (ATROVENT) 0.06 % nasal spray SPRAY TWICE INTO EACH NOSTRIL 4 TIMES A DAY 15 mL 1  . losartan-hydrochlorothiazide (HYZAAR) 100-12.5 MG tablet Take 1 tablet by mouth daily. 90 tablet 3  . mometasone (ELOCON) 0.1 % cream Apply 1 application topically daily. (Patient taking differently: Apply 1 application daily as needed topically. ) 45 g 1  . pantoprazole (PROTONIX) 40 MG tablet Take 1 tablet (40 mg total) by mouth daily. 90 tablet 3  . sertraline (ZOLOFT) 50 MG tablet Take 1.5 tablets (75 mg total) by mouth daily. 45 tablet 11   No current facility-administered medications on file prior to visit.    Allergies  Allergen Reactions  . Cymbalta [Duloxetine Hcl] Swelling   Social History   Socioeconomic History  . Marital status: Married    Spouse name: Not on file  . Number of children: Not on file  . Years of education: Not on file  . Highest education level: Not on file  Social Needs  . Financial resource strain: Not on file  . Food insecurity - worry: Not on file  . Food insecurity - inability: Not on file  . Transportation needs - medical: Not on file  . Transportation needs - non-medical: Not on file  Occupational History  . Not on file  Tobacco Use  . Smoking status: Never Smoker  . Smokeless tobacco: Never Used  Substance and Sexual Activity  . Alcohol use: Yes    Alcohol/week: 0.6 oz    Types: 1 Glasses of wine per week  . Drug use: No  . Sexual activity: No  Other Topics Concern  . Not on file  Social History Narrative  . Not on file      Review of Systems  All other systems reviewed and are negative.      Objective:   Physical Exam  Constitutional: She appears well-developed and well-nourished. No distress.  HENT:  Right Ear: Tympanic membrane, external ear and ear canal normal.  Left Ear: Tympanic membrane, external ear and ear canal normal.  Nose: Mucosal edema and rhinorrhea present. Right sinus exhibits frontal  sinus tenderness. Left sinus exhibits maxillary sinus tenderness and frontal sinus tenderness.  Mouth/Throat: Oropharynx is clear and moist. No oropharyngeal exudate.  Eyes: Conjunctivae are normal.  Neck: Neck supple.  Cardiovascular: Normal rate, regular rhythm and normal heart sounds.  Pulmonary/Chest: Effort normal. No respiratory distress. She has wheezes. She has no rales.  Lymphadenopathy:    She has no cervical adenopathy.  Skin: She is not diaphoretic.  Vitals reviewed.         Assessment & Plan:  Rhinosinusitis  The patient has a viral upper respiratory infection, and seems to be developing a secondary bacterial sinusitis. I have recommended 2-3 days of continued tincture of time. I have recommended Coricidin HBP for nasal congestion in addition to Flonase. I have recommended Tessalon Perles as needed for cough 200 mg every 8 hours. I did give the patient a prescription for Augmentin. However I ask her not to fill the prescription unless symptoms persist greater than 3 more days would be 10 days total, or she develops fever, worsening sinus pain, worsening dental pain, worsening sinus headaches. She states that she understands and will not fill the prescription unless the symptoms occur. I did refill her pain medication along with Ambien that she takes as needed for sleep. I did also write her a note to excuse her from jury duty. She has severe scoliosis and chronic severe low back pain stemming from the 5 previous surgeries she's had to correct her scoliosis. As a result she is unable to sit for prolonged periods of time making it impossible for her to serve on jury duty as she frequently has to stand, walk, and change positions. She is also caring for her son who suffered brain injury last year and requires supervision and assistance with ADLs.

## 2017-12-26 ENCOUNTER — Other Ambulatory Visit: Payer: Self-pay | Admitting: Family Medicine

## 2018-01-12 ENCOUNTER — Encounter: Payer: Self-pay | Admitting: Physician Assistant

## 2018-01-12 ENCOUNTER — Other Ambulatory Visit: Payer: Self-pay

## 2018-01-12 ENCOUNTER — Ambulatory Visit (INDEPENDENT_AMBULATORY_CARE_PROVIDER_SITE_OTHER): Payer: Medicare Other | Admitting: Physician Assistant

## 2018-01-12 VITALS — BP 132/98 | HR 87 | Temp 97.8°F | Resp 16 | Wt 230.8 lb

## 2018-01-12 DIAGNOSIS — M25461 Effusion, right knee: Secondary | ICD-10-CM

## 2018-01-12 NOTE — Progress Notes (Signed)
Patient ID: Faith Reeves MRN: [id number], DOB: Jan 06, 1951, 67 y.o. Date of Encounter: 01/12/2018, 10:36 AM    Chief Complaint:  Chief Complaint  Patient presents with  . fell leg pain     HPI: 67 y.o. year old female presents with above.   I reviewed her chart. She has a history of scoliosis and multiple back surgeries and chronic pain and lower extremity peripheral neuropathy.  Her husband accompanies her for visit today. He reports that when she fell it was because their son had left the vacuum cleaner on the floor and she did not realize that was there and tripped over the vacuum cleaner and fell.  States that floor does have carpet.  She reports that this fall occurred 2 weeks ago this past Saturday. In her words-- she states "it was swollen.  Now that the swelling is decreased, the pain is increasing ".  There is also some bruising but that is decreasing. Is taking a Celebrex for this pain.  States that she takes 1 oxycodone per day and is continued at that amount/ dose.     Home Meds:   Outpatient Medications Prior to Visit  Medication Sig Dispense Refill  . albuterol (PROVENTIL HFA;VENTOLIN HFA) 108 (90 Base) MCG/ACT inhaler Inhale 2 puffs into the lungs every 4 (four) hours as needed for wheezing or shortness of breath. 1 Inhaler 0  . amoxicillin (AMOXIL) 500 MG capsule Take 2,000 mg by mouth once. Prior to dental work    . amoxicillin-clavulanate (AUGMENTIN) 875-125 MG tablet Take 1 tablet by mouth 2 (two) times daily. 20 tablet 0  . celecoxib (CELEBREX) 200 MG capsule TAKE 1 CAPSULE BY MOUTH EVERY DAY 90 capsule 3  . gabapentin (NEURONTIN) 300 MG capsule Take 1 capsule (300 mg total) by mouth 3 (three) times daily. 90 capsule 1  . ipratropium (ATROVENT) 0.06 % nasal spray SPRAY TWICE INTO EACH NOSTRIL 4 TIMES A DAY 15 mL 1  . losartan-hydrochlorothiazide (HYZAAR) 100-12.5 MG tablet Take 1 tablet by mouth daily. 90 tablet 3  . mometasone (ELOCON) 0.1 % cream Apply 1  application topically daily. (Patient taking differently: Apply 1 application daily as needed topically. ) 45 g 1  . oxyCODONE-acetaminophen (PERCOCET) 10-325 MG tablet Take 1 tablet by mouth every 4 (four) hours as needed for pain. 90 tablet 0  . pantoprazole (PROTONIX) 40 MG tablet Take 1 tablet (40 mg total) by mouth daily. 90 tablet 3  . sertraline (ZOLOFT) 50 MG tablet Take 1.5 tablets (75 mg total) by mouth daily. 45 tablet 11  . zolpidem (AMBIEN) 10 MG tablet Take 1 tablet (10 mg total) by mouth at bedtime as needed. 30 tablet 2  . benzonatate (TESSALON) 100 MG capsule Take 2 capsules (200 mg total) by mouth 3 (three) times daily as needed for cough. 30 capsule 0   No facility-administered medications prior to visit.     Allergies:  Allergies  Allergen Reactions  . Cymbalta [Duloxetine Hcl] Swelling      Review of Systems: See HPI for pertinent ROS. All other ROS negative.    Physical Exam: Blood pressure (!) 132/98, pulse 87, temperature 97.8 F (36.6 C), temperature source Oral, resp. rate 16, weight 104.7 kg (230 lb 12.8 oz), SpO2 98 %., Body mass index is 46.62 kg/m. General:  Obese WF. Appears in no acute distress. Neck: Supple. No thyromegaly. No lymphadenopathy. Lungs: Clear bilaterally to auscultation without wheezes, rales, or rhonchi. Breathing is unlabored. Heart: Regular rhythm. No murmurs,  rubs, or gallops. Msk:  Strength and tone normal for age. Right Knee is with very large effusion. Minimal ecchymosis.  Extremities/Skin: Warm and dry.  Neuro: Alert and oriented X 3. Moves all extremities spontaneously. Gait is normal. CNII-XII grossly in tact. Psych:  Responds to questions appropriately with a normal affect.     ASSESSMENT AND PLAN:  68 y.o. year old female with  1. Effusion, right knee This needs to be drained. Will refer to Orthopedics.  States that she has never seen any specific orthopedic in the past and has no preference of who she sees.  I have  marked this as urgent and will work on getting her into orthopedics as soon as possible.  She is available to go any time this week except cannot go this afternoon and cannot go Wednesday afternoon. She states that her pain is controlled with current medications and can continue current treatment in the interim until follow-up with orthopedic. She may also need x-ray to rule out fracture but she can have this done at orthopedics, if needed. - AMB referral to orthopedics   Signed, Olean Ree [place], [place], [facility] 01/12/2018 10:36 AM

## 2018-01-13 ENCOUNTER — Ambulatory Visit: Payer: Medicare Other | Admitting: Family Medicine

## 2018-01-15 ENCOUNTER — Ambulatory Visit (INDEPENDENT_AMBULATORY_CARE_PROVIDER_SITE_OTHER): Payer: Medicare Other

## 2018-01-15 ENCOUNTER — Ambulatory Visit (INDEPENDENT_AMBULATORY_CARE_PROVIDER_SITE_OTHER): Payer: Medicare Other | Admitting: Physician Assistant

## 2018-01-15 ENCOUNTER — Encounter (INDEPENDENT_AMBULATORY_CARE_PROVIDER_SITE_OTHER): Payer: Self-pay | Admitting: Physician Assistant

## 2018-01-15 DIAGNOSIS — S8001XA Contusion of right knee, initial encounter: Secondary | ICD-10-CM

## 2018-01-15 MED ORDER — BUPIVACAINE HCL 0.25 % IJ SOLN
2.0000 mL | INTRAMUSCULAR | Status: AC | PRN
  Administered 2018-01-15: 2 mL via INTRA_ARTICULAR

## 2018-01-15 MED ORDER — METHYLPREDNISOLONE ACETATE 40 MG/ML IJ SUSP
40.0000 mg | INTRAMUSCULAR | Status: AC | PRN
  Administered 2018-01-15: 40 mg via INTRA_ARTICULAR

## 2018-01-15 MED ORDER — LIDOCAINE HCL 1 % IJ SOLN
2.0000 mL | INTRAMUSCULAR | Status: AC | PRN
  Administered 2018-01-15: 2 mL

## 2018-01-15 NOTE — Progress Notes (Signed)
Office Visit Note   Patient: Faith Reeves           Date of Birth: 18-Dec-1950           MRN: [id number] Visit Date: 01/15/2018              Requested by: Orlena Sheldon, PA-C [address] [place], [place] [address] PCP: Susy Frizzle, MD   Assessment & Plan: Visit Diagnoses:  1. Traumatic hematoma of right knee, initial encounter     Plan: Impression is right knee extra-articular hematoma.  Today, we attempted aspiration of the right knee.  Was unable to obtain any joint fluid (likely because this is extra-articular vs  congealed from the injury being 73 weeks old), but I did go ahead and inject her knee joint with cortisone.  She will rest ice and elevate as much as possible today and tomorrow.  She will call with concerns or questions in the meantime.  Follow-Up Instructions: Return in about 3 weeks (around 02/05/2018), or if symptoms worsen or fail to improve.   Orders:  Orders Placed This Encounter  Procedures  . Large Joint Inj: R knee  . XR KNEE 3 VIEW RIGHT   No orders of the defined types were placed in this encounter.     Procedures: Large Joint Inj: R knee on 01/15/2018 10:47 AM Indications: pain Details: 22 G needle, anterolateral approach Medications: 2 mL lidocaine 1 %; 2 mL bupivacaine 0.25 %; 40 mg methylPREDNISolone acetate 40 MG/ML      Clinical Data: No additional findings.   Subjective: Chief Complaint  Patient presents with  . Right Knee - Pain    HPI Faith Reeves is a pleasant 67 year old female who presents to our clinic today with right knee pain.  She states that approximately 3 weeks ago she sustained a mechanical fall landing on the anterior aspect of her knee.  The pain she has is throughout the entire knee.  She describes this as a constant ache worse with walking.  No mechanical symptoms.  She does take oxycodone at night, gabapentin and Celebrex during the day.  She was on these medications prior to this fall.  She was seen by her  primary care provider last week.  X-rays were not obtained.  Review of Systems as detailed in HPI.  All others are negative.   Objective: Vital Signs: There were no vitals taken for this visit.  Physical Exam well-developed well-nourished female in no acute distress.  Alert and oriented x3.  Ortho Exam examination of her right knee reveals a 2+ effusion.  Moderate tenderness throughout.  No ecchymosis.  She is able to fully straight leg raise.  Range of motion is from 0-90 degrees.  She is stable to valgus varus stress.  She is rest intact distally.  Specialty Comments:  No specialty comments available.  Imaging: Xr Knee 3 View Right  Result Date: 01/15/2018 X-rays are negative for acute fracture.  There does appear to be a very large effusion.  She does have moderate tricompartmental osteoarthritis with medial and lateral chondrocalcinosis.    PMFS History: Patient Active Problem List   Diagnosis Date Noted  . Macular degeneration of right eye 12/18/2016  . Allergy   . Anxiety   . Blood transfusion without reported diagnosis   . Cataract   . Hypertension    Past Medical History:  Diagnosis Date  . Allergy   . Anemia   . Anxiety   . Blood  transfusion without reported diagnosis   . Cataract   . GERD (gastroesophageal reflux disease)   . Hypertension   . Macular degeneration     Family History  Problem Relation Age of Onset  . Hypertension Mother   . Cancer Sister   . Stroke Maternal Grandmother     Past Surgical History:  Procedure Laterality Date  . ABDOMINAL HYSTERECTOMY    . ACHILLES TENDON REPAIR Bilateral   . APPENDECTOMY    . BACK SURGERY    . BILATERAL CARPAL TUNNEL RELEASE    . CHOLECYSTECTOMY    . COLONOSCOPY    . HERNIA REPAIR    . JOINT REPLACEMENT    . MELANOMA EXCISION Right 09/15/2017   Procedure: EXCISION LIPOSARCOMA RIGHT ARM;  Surgeon: Georganna Skeans, MD;  Location: [facility];  Service: General;  Laterality: Right;  . SPINE SURGERY      Social History   Occupational History  . Not on file  Tobacco Use  . Smoking status: Never Smoker  . Smokeless tobacco: Never Used  Substance and Sexual Activity  . Alcohol use: Yes    Alcohol/week: 0.6 oz    Types: 1 Glasses of wine per week  . Drug use: No  . Sexual activity: No

## 2018-01-20 ENCOUNTER — Ambulatory Visit (INDEPENDENT_AMBULATORY_CARE_PROVIDER_SITE_OTHER): Payer: Medicare Other | Admitting: Orthopaedic Surgery

## 2018-01-21 ENCOUNTER — Other Ambulatory Visit: Payer: Self-pay | Admitting: Family Medicine

## 2018-01-21 MED ORDER — SERTRALINE HCL 50 MG PO TABS: 75.0000 mg | ORAL_TABLET | Freq: Every day | ORAL | 3 refills | Status: DC

## 2018-01-21 MED ORDER — GABAPENTIN 300 MG PO CAPS: 300.0000 mg | ORAL_CAPSULE | Freq: Three times a day (TID) | ORAL | 1 refills | Status: DC

## 2018-01-29 ENCOUNTER — Other Ambulatory Visit: Payer: Self-pay | Admitting: Family Medicine

## 2018-01-29 NOTE — Telephone Encounter (Signed)
Ok to refill??  Last office visit/ refill 11/21/2017.

## 2018-01-29 NOTE — Telephone Encounter (Signed)
Requesting refill    Ambien  LOV: 01/12/18  LRF:  11/21/17

## 2018-02-05 ENCOUNTER — Encounter (INDEPENDENT_AMBULATORY_CARE_PROVIDER_SITE_OTHER): Payer: Self-pay | Admitting: Orthopaedic Surgery

## 2018-02-05 ENCOUNTER — Ambulatory Visit (INDEPENDENT_AMBULATORY_CARE_PROVIDER_SITE_OTHER): Payer: Medicare Other | Admitting: Orthopaedic Surgery

## 2018-02-05 DIAGNOSIS — S8001XA Contusion of right knee, initial encounter: Secondary | ICD-10-CM | POA: Diagnosis not present

## 2018-02-05 NOTE — Progress Notes (Signed)
Office Visit Note   Patient: Faith Reeves           Date of Birth: 07-Mar-1951           MRN: [id number] Visit Date: 02/05/2018              Requested by: Susy Frizzle, MD [address] [place], [place] [address] PCP: Susy Frizzle, MD   Assessment & Plan: Visit Diagnoses:  1. Traumatic hematoma of right knee, initial encounter     Plan: Impression is right knee contusion to prepatellar bursa.  I discussed with her that this tends to be symptomatic up to several months sometimes.  I recommend warm compresses and ice as needed.  She does have some underlying arthritis which has improved slightly from the cortisone injection.  I see no indications for surgical evacuation or aspiration.  Questions encouraged and answered.  Follow-up as needed.  Follow-Up Instructions: Return if symptoms worsen or fail to improve.   Orders:  No orders of the defined types were placed in this encounter.  No orders of the defined types were placed in this encounter.     Procedures: No procedures performed   Clinical Data: No additional findings.   Subjective: Chief Complaint  Patient presents with  . Right Knee - Pain    Patient follows up today for right knee contusion.  She feels slightly better from the injection that she received about 3 weeks ago.   Review of Systems   Objective: Vital Signs: There were no vitals taken for this visit.  Physical Exam  Ortho Exam Right knee exam shows subcutaneous swelling and tenderness of the prepatellar bursa.  There is no skin compromise.  No joint effusion.  Extensor mechanism is strong and intact.  The bursa is fluctuant to palpation.  There is no cellulitis. Specialty Comments:  No specialty comments available.  Imaging: No results found.   PMFS History: Patient Active Problem List   Diagnosis Date Noted  . Macular degeneration of right eye 12/18/2016  . Allergy   . Anxiety   . Blood transfusion without reported  diagnosis   . Cataract   . Hypertension    Past Medical History:  Diagnosis Date  . Allergy   . Anemia   . Anxiety   . Blood transfusion without reported diagnosis   . Cataract   . GERD (gastroesophageal reflux disease)   . Hypertension   . Macular degeneration     Family History  Problem Relation Age of Onset  . Hypertension Mother   . Cancer Sister   . Stroke Maternal Grandmother     Past Surgical History:  Procedure Laterality Date  . ABDOMINAL HYSTERECTOMY    . ACHILLES TENDON REPAIR Bilateral   . APPENDECTOMY    . BACK SURGERY    . BILATERAL CARPAL TUNNEL RELEASE    . CHOLECYSTECTOMY    . COLONOSCOPY    . HERNIA REPAIR    . JOINT REPLACEMENT    . MELANOMA EXCISION Right 09/15/2017   Procedure: EXCISION LIPOSARCOMA RIGHT ARM;  Surgeon: Georganna Skeans, MD;  Location: [facility];  Service: General;  Laterality: Right;  . SPINE SURGERY     Social History   Occupational History  . Not on file  Tobacco Use  . Smoking status: Never Smoker  . Smokeless tobacco: Never Used  Substance and Sexual Activity  . Alcohol use: Yes    Alcohol/week: 0.6 oz    Types: 1 Glasses  of wine per week  . Drug use: No  . Sexual activity: Never

## 2018-02-16 ENCOUNTER — Other Ambulatory Visit: Payer: Self-pay | Admitting: Family Medicine

## 2018-02-16 DIAGNOSIS — L299 Pruritus, unspecified: Secondary | ICD-10-CM

## 2018-02-16 NOTE — Telephone Encounter (Signed)
Patient requesting a refill on Oxycodone     LOV:  11/21/17  LRF:   11/21/17

## 2018-02-16 NOTE — Telephone Encounter (Signed)
Pt needs refill on oxycodone to [facility].

## 2018-02-17 MED ORDER — OXYCODONE-ACETAMINOPHEN 10-325 MG PO TABS: 1.0000 | ORAL_TABLET | ORAL | 0 refills | Status: DC | PRN

## 2018-02-21 ENCOUNTER — Other Ambulatory Visit: Payer: Self-pay | Admitting: Family Medicine

## 2018-05-01 ENCOUNTER — Other Ambulatory Visit: Payer: Self-pay | Admitting: Family Medicine

## 2018-05-01 NOTE — Telephone Encounter (Signed)
Ok to refill??  Last office visit 01/12/2018.  Last refill 01/29/2018, #2 refills.

## 2018-05-21 DIAGNOSIS — H40013 Open angle with borderline findings, low risk, bilateral: Secondary | ICD-10-CM | POA: Diagnosis not present

## 2018-05-21 DIAGNOSIS — H40033 Anatomical narrow angle, bilateral: Secondary | ICD-10-CM | POA: Diagnosis not present

## 2018-05-29 ENCOUNTER — Other Ambulatory Visit: Payer: Self-pay | Admitting: Family Medicine

## 2018-05-29 MED ORDER — IPRATROPIUM BROMIDE 0.06 % NA SOLN: NASAL | 1 refills | Status: DC

## 2018-06-03 ENCOUNTER — Other Ambulatory Visit: Payer: Self-pay | Admitting: Family Medicine

## 2018-06-03 NOTE — Telephone Encounter (Signed)
Refill on [facility]codone to [facility] [address].  °

## 2018-06-03 NOTE — Telephone Encounter (Signed)
Patient requesting a refill on Oxycodone     LOV: 01/12/18  LRF:    02/17/18

## 2018-06-04 MED ORDER — OXYCODONE-ACETAMINOPHEN 10-325 MG PO TABS: 1.0000 | ORAL_TABLET | ORAL | 0 refills | Status: DC | PRN

## 2018-06-09 ENCOUNTER — Other Ambulatory Visit: Payer: Self-pay | Admitting: Family Medicine

## 2018-07-20 ENCOUNTER — Other Ambulatory Visit: Payer: Self-pay | Admitting: Family Medicine

## 2018-07-20 DIAGNOSIS — L299 Pruritus, unspecified: Secondary | ICD-10-CM

## 2018-07-27 ENCOUNTER — Other Ambulatory Visit: Payer: Self-pay | Admitting: Family Medicine

## 2018-07-28 NOTE — Telephone Encounter (Signed)
Ok to refill??  Last office visit 01/12/2018.  Last refill 05/01/2018, #2 refills.

## 2018-08-06 ENCOUNTER — Other Ambulatory Visit: Payer: Self-pay | Admitting: Family Medicine

## 2018-08-11 ENCOUNTER — Other Ambulatory Visit: Payer: Self-pay | Admitting: *Deleted

## 2018-08-11 MED ORDER — TELMISARTAN-HCTZ 80-12.5 MG PO TABS: 1.0000 | ORAL_TABLET | Freq: Every day | ORAL | 1 refills | Status: DC

## 2018-08-20 ENCOUNTER — Other Ambulatory Visit: Payer: Self-pay | Admitting: Family Medicine

## 2018-08-23 ENCOUNTER — Other Ambulatory Visit: Payer: Self-pay | Admitting: Family Medicine

## 2018-08-31 ENCOUNTER — Ambulatory Visit (INDEPENDENT_AMBULATORY_CARE_PROVIDER_SITE_OTHER): Payer: Medicare Other

## 2018-08-31 DIAGNOSIS — Z23 Encounter for immunization: Secondary | ICD-10-CM | POA: Diagnosis not present

## 2018-08-31 NOTE — Progress Notes (Signed)
Patient was in office for flu vaccine.Patient received vaccine in her right deltoid.Patient tolerated well  

## 2018-09-08 ENCOUNTER — Encounter: Payer: Self-pay | Admitting: Family Medicine

## 2018-09-08 ENCOUNTER — Ambulatory Visit (INDEPENDENT_AMBULATORY_CARE_PROVIDER_SITE_OTHER): Payer: Medicare Other | Admitting: Family Medicine

## 2018-09-08 VITALS — BP 136/88 | HR 68 | Temp 97.8°F | Resp 16 | Ht 59.0 in | Wt 231.0 lb

## 2018-09-08 DIAGNOSIS — Z0000 Encounter for general adult medical examination without abnormal findings: Secondary | ICD-10-CM

## 2018-09-08 DIAGNOSIS — G8929 Other chronic pain: Secondary | ICD-10-CM

## 2018-09-08 DIAGNOSIS — Z1239 Encounter for other screening for malignant neoplasm of breast: Secondary | ICD-10-CM | POA: Diagnosis not present

## 2018-09-08 DIAGNOSIS — Z23 Encounter for immunization: Secondary | ICD-10-CM

## 2018-09-08 DIAGNOSIS — F419 Anxiety disorder, unspecified: Secondary | ICD-10-CM

## 2018-09-08 DIAGNOSIS — I10 Essential (primary) hypertension: Secondary | ICD-10-CM

## 2018-09-08 DIAGNOSIS — H3530 Unspecified macular degeneration: Secondary | ICD-10-CM | POA: Diagnosis not present

## 2018-09-08 DIAGNOSIS — M5440 Lumbago with sciatica, unspecified side: Secondary | ICD-10-CM

## 2018-09-08 DIAGNOSIS — Z780 Asymptomatic menopausal state: Secondary | ICD-10-CM

## 2018-09-08 MED ORDER — VALACYCLOVIR HCL 1 G PO TABS: 2000.0000 mg | ORAL_TABLET | Freq: Two times a day (BID) | ORAL | 2 refills | Status: DC

## 2018-09-08 NOTE — Addendum Note (Signed)
Addended by: Shary Decamp B on: 09/08/2018 12:33 PM   Modules accepted: Orders

## 2018-09-08 NOTE — Progress Notes (Signed)
Subjective:    Patient ID: Faith Reeves, female    DOB: Apr 16, 1951, 67 y.o.   MRN: [id number]  HPI Here today for CPE.  Patient has a history of 3 separate back surgeries. She has severe scoliosis. She's had what sounds like Harrington rod placement in [place] as well as a laminectomy. She has chronic low back pain for which she takes gabapentin 300 mg 2-3 times a day for neuropathic pain in her legs. She also takes Percocet 10/325 however she does not take this regularly and usually only takes 1 pill a day to help her sleep at night. She has tried to stop the medication the past however the pain in her back is so severe that she needs to take something at night to help her sleep. She has a visibly deformed spine with severe scoliosis particularly pronounced to the right side.  She does not require a Pap smear because she had a hysterectomy for fibroids. Colonoscopy was performed in 2012 and is up-to-date and she refuses another.  She is due for a mammogram and DEXA.    Past Medical History:  Diagnosis Date  . Allergy   . Anemia   . Anxiety   . Blood transfusion without reported diagnosis   . Cataract   . GERD (gastroesophageal reflux disease)   . Hypertension   . Macular degeneration    Past Surgical History:  Procedure Laterality Date  . ABDOMINAL HYSTERECTOMY    . ACHILLES TENDON REPAIR Bilateral   . APPENDECTOMY    . BACK SURGERY    . BILATERAL CARPAL TUNNEL RELEASE    . CHOLECYSTECTOMY    . COLONOSCOPY    . HERNIA REPAIR    . JOINT REPLACEMENT    . MELANOMA EXCISION Right 09/15/2017   Procedure: EXCISION LIPOSARCOMA RIGHT ARM;  Surgeon: Georganna Skeans, MD;  Location: [facility];  Service: General;  Laterality: Right;  . SPINE SURGERY     Current Outpatient Medications on File Prior to Visit  Medication Sig Dispense Refill  . albuterol (PROVENTIL HFA;VENTOLIN HFA) 108 (90 Base) MCG/ACT inhaler Inhale 2 puffs into the lungs every 4 (four) hours as needed for wheezing or  shortness of breath. 1 Inhaler 0  . amoxicillin (AMOXIL) 500 MG capsule Take 2,000 mg by mouth once. Prior to dental work    . celecoxib (CELEBREX) 200 MG capsule TAKE 1 CAPSULE BY MOUTH EVERY DAY 90 capsule 3  . gabapentin (NEURONTIN) 300 MG capsule Take 1 capsule (300 mg total) by mouth 3 (three) times daily. 90 capsule 1  . ipratropium (ATROVENT) 0.06 % nasal spray INSTILL 2 SPRAYS INTO EACH NOSTRIL 4 TIMES A DAY 15 mL 1  . mometasone (ELOCON) 0.1 % cream Apply 1 application topically daily as needed. 45 g 1  . oxyCODONE-acetaminophen (PERCOCET) 10-325 MG tablet Take 1 tablet by mouth every 4 (four) hours as needed for pain (G89.4). 90 tablet 0  . pantoprazole (PROTONIX) 40 MG tablet TAKE 1 TABLET BY MOUTH EVERY DAY 90 tablet 3  . sertraline (ZOLOFT) 50 MG tablet Take 1.5 tablets (75 mg total) by mouth daily. 135 tablet 3  . telmisartan-hydrochlorothiazide (MICARDIS HCT) 80-12.5 MG tablet Take 1 tablet by mouth daily. 180 tablet 1  . zolpidem (AMBIEN) 10 MG tablet Take 1 tablet (10 mg total) by mouth at bedtime as needed. 30 tablet 2   No current facility-administered medications on file prior to visit.    Allergies  Allergen Reactions  . Cymbalta [Duloxetine Hcl]  Swelling   Social History   Socioeconomic History  . Marital status: Married    Spouse name: Not on file  . Number of children: Not on file  . Years of education: Not on file  . Highest education level: Not on file  Occupational History  . Not on file  Social Needs  . Financial resource strain: Not on file  . Food insecurity:    Worry: Not on file    Inability: Not on file  . Transportation needs:    Medical: Not on file    Non-medical: Not on file  Tobacco Use  . Smoking status: Never Smoker  . Smokeless tobacco: Never Used  Substance and Sexual Activity  . Alcohol use: Yes    Alcohol/week: 1.0 standard drinks    Types: 1 Glasses of wine per week  . Drug use: No  . Sexual activity: Never  Lifestyle  .  Physical activity:    Days per week: Not on file    Minutes per session: Not on file  . Stress: Not on file  Relationships  . Social connections:    Talks on phone: Not on file    Gets together: Not on file    Attends religious service: Not on file    Active member of club or organization: Not on file    Attends meetings of clubs or organizations: Not on file    Relationship status: Not on file  . Intimate partner violence:    Fear of current or ex partner: Not on file    Emotionally abused: Not on file    Physically abused: Not on file    Forced sexual activity: Not on file  Other Topics Concern  . Not on file  Social History Narrative  . Not on file   Family History  Problem Relation Age of Onset  . Hypertension Mother   . Cancer Sister   . Stroke Maternal Grandmother      Review of Systems  All other systems reviewed and are negative.      Objective:   Physical Exam  Constitutional: She is oriented to person, place, and time. She appears well-developed and well-nourished. No distress.  HENT:  Head: Normocephalic and atraumatic.  Right Ear: External ear normal.  Left Ear: External ear normal.  Nose: Nose normal.  Mouth/Throat: Oropharynx is clear and moist. No oropharyngeal exudate.  Eyes: Pupils are equal, round, and reactive to light. Conjunctivae and EOM are normal. Right eye exhibits no discharge. Left eye exhibits no discharge. No scleral icterus.  Neck: Normal range of motion. Neck supple. No JVD present. No tracheal deviation present. No thyromegaly present.  Cardiovascular: Normal rate, regular rhythm, normal heart sounds and intact distal pulses. Exam reveals no gallop and no friction rub.  No murmur heard. Pulmonary/Chest: Effort normal and breath sounds normal. No stridor. No respiratory distress. She has no wheezes. She has no rales. She exhibits no tenderness.  Abdominal: Soft. Bowel sounds are normal. She exhibits no distension and no mass. There is no  tenderness. There is no rebound and no guarding.  Musculoskeletal: Normal range of motion. She exhibits no edema or tenderness.  Lymphadenopathy:    She has no cervical adenopathy.  Neurological: She is alert and oriented to person, place, and time. She has normal reflexes. No cranial nerve deficit. She exhibits normal muscle tone. Coordination normal.  Skin: Skin is warm. No rash noted. She is not diaphoretic. No erythema. No pallor.  Psychiatric: She has a  normal mood and affect. Her behavior is normal. Judgment and thought content normal.          Assessment & Plan:  General medical exam  Chronic low back pain with sciatica, sciatica laterality unspecified, unspecified back pain laterality  Benign essential HTN  Macular degeneration of right eye, unspecified type  Patient's blood pressure today is excellent.  She received Prevnar 13.  I recommended shin Grix as well as a tetanus shot depending upon her insurance coverage.  She will check on the price first prior to receiving the.  Colonoscopy is up-to-date.  She does not require Pap smear.  I will schedule her for mammogram along with a bone density.  In addition I will check a CBC, CMP, fasting lipid panel.  Patient denies any falls.  She denies any memory loss.  She denies any depression that is not adequately controlled on her current medication.

## 2018-09-09 LAB — COMPLETE METABOLIC PANEL WITH GFR
AG RATIO: 1.2 (calc) (ref 1.0–2.5)
ALT: 12 U/L (ref 6–29)
AST: 17 U/L (ref 10–35)
Albumin: 3.8 g/dL (ref 3.6–5.1)
Alkaline phosphatase (APISO): 103 U/L (ref 33–130)
BUN/Creatinine Ratio: 20 (calc) (ref 6–22)
BUN: 21 mg/dL (ref 7–25)
CALCIUM: 9.2 mg/dL (ref 8.6–10.4)
CO2: 29 mmol/L (ref 20–32)
Chloride: 103 mmol/L (ref 98–110)
Creat: 1.05 mg/dL — ABNORMAL HIGH (ref 0.50–0.99)
GFR, EST AFRICAN AMERICAN: 63 mL/min/{1.73_m2} (ref >=60)
GFR, EST NON AFRICAN AMERICAN: 54 mL/min/{1.73_m2} — AB (ref >=60)
Globulin: 3.3 g/dL (calc) (ref 1.9–3.7)
Glucose, Bld: 89 mg/dL (ref 65–99)
POTASSIUM: 4 mmol/L (ref 3.5–5.3)
Sodium: 139 mmol/L (ref 135–146)
TOTAL PROTEIN: 7.1 g/dL (ref 6.1–8.1)
Total Bilirubin: 0.5 mg/dL (ref 0.2–1.2)

## 2018-09-09 LAB — CBC WITH DIFFERENTIAL/PLATELET
BASOS PCT: 0.8 %
Basophils Absolute: 39 cells/uL (ref 0–200)
EOS ABS: 181 {cells}/uL (ref 15–500)
Eosinophils Relative: 3.7 %
HEMATOCRIT: 42.5 % (ref 35.0–45.0)
HEMOGLOBIN: 13.9 g/dL (ref 11.7–15.5)
Lymphs Abs: 946 cells/uL (ref 850–3900)
MCH: 27.6 pg (ref 27.0–33.0)
MCHC: 32.7 g/dL (ref 32.0–36.0)
MCV: 84.3 fL (ref 80.0–100.0)
MONOS PCT: 7.1 %
MPV: 11.6 fL (ref 7.5–12.5)
NEUTROS PCT: 69.1 %
Neutro Abs: 3386 cells/uL (ref 1500–7800)
Platelets: 224 10*3/uL (ref 140–400)
RBC: 5.04 10*6/uL (ref 3.80–5.10)
RDW: 13.9 % (ref 11.0–15.0)
TOTAL LYMPHOCYTE: 19.3 %
WBC: 4.9 10*3/uL (ref 3.8–10.8)
WBCMIX: 348 {cells}/uL (ref 200–950)

## 2018-09-09 LAB — LIPID PANEL
Cholesterol: 143 mg/dL (ref <200)
HDL: 61 mg/dL (ref >50)
LDL Cholesterol (Calc): 72 mg/dL (calc)
NON-HDL CHOLESTEROL (CALC): 82 mg/dL (ref <130)
Total CHOL/HDL Ratio: 2.3 (calc) (ref <5.0)
Triglycerides: 34 mg/dL (ref <150)

## 2018-09-11 ENCOUNTER — Other Ambulatory Visit: Payer: Self-pay | Admitting: Family Medicine

## 2018-09-11 DIAGNOSIS — Z1231 Encounter for screening mammogram for malignant neoplasm of breast: Secondary | ICD-10-CM

## 2018-09-14 ENCOUNTER — Other Ambulatory Visit: Payer: Self-pay | Admitting: Family Medicine

## 2018-09-17 ENCOUNTER — Other Ambulatory Visit: Payer: Self-pay | Admitting: Family Medicine

## 2018-09-17 MED ORDER — OXYCODONE-ACETAMINOPHEN 10-325 MG PO TABS: 1.0000 | ORAL_TABLET | ORAL | 0 refills | Status: DC | PRN

## 2018-09-17 NOTE — Telephone Encounter (Signed)
Ok to refill??  Last office visit 09/08/2018.  Last refill 06/04/2018

## 2018-09-17 NOTE — Telephone Encounter (Signed)
Patient said oxycodone was supposed to be called in for her last week, however pharmacy does not have this  Could this be called [facility] the [facility] [address]

## 2018-09-21 ENCOUNTER — Telehealth: Payer: Self-pay | Admitting: Family Medicine

## 2018-09-21 MED ORDER — OXYCODONE-ACETAMINOPHEN 10-325 MG PO TABS: 1.0000 | ORAL_TABLET | ORAL | 0 refills | Status: DC | PRN

## 2018-09-21 NOTE — Telephone Encounter (Signed)
Pt needs [place] to write a paper script for oxycodone because her pharmacy that we sent it to electronic to pharmacy did not have it.   Would prefer a paper copy.

## 2018-09-21 NOTE — Telephone Encounter (Signed)
Called pharm [facility] and they will not have any in stock until Wednesday - cancelled rx  - printed signed and left up front and pt aware to pick up after lunch.

## 2018-10-09 ENCOUNTER — Other Ambulatory Visit: Payer: Self-pay | Admitting: Family Medicine

## 2018-10-12 ENCOUNTER — Other Ambulatory Visit: Payer: Self-pay | Admitting: *Deleted

## 2018-10-12 MED ORDER — IPRATROPIUM BROMIDE 0.06 % NA SOLN: NASAL | 1 refills | Status: DC

## 2018-10-20 ENCOUNTER — Other Ambulatory Visit: Payer: Self-pay | Admitting: Family Medicine

## 2018-10-24 ENCOUNTER — Other Ambulatory Visit: Payer: Self-pay | Admitting: Family Medicine

## 2018-10-26 NOTE — Telephone Encounter (Signed)
Requesting refill    Ambien  LOV: 09/08/18  LRF:  11/21/17

## 2018-11-06 ENCOUNTER — Other Ambulatory Visit: Payer: Self-pay | Admitting: Family Medicine

## 2018-11-18 ENCOUNTER — Ambulatory Visit
Admission: RE | Admit: 2018-11-18 | Discharge: 2018-11-18 | Disposition: A | Discharge status: Home / Self Care | Payer: Medicare Other | Source: Ambulatory Visit | Attending: Family Medicine | Admitting: Family Medicine

## 2018-11-18 DIAGNOSIS — Z780 Asymptomatic menopausal state: Secondary | ICD-10-CM | POA: Diagnosis not present

## 2018-11-18 DIAGNOSIS — Z1231 Encounter for screening mammogram for malignant neoplasm of breast: Secondary | ICD-10-CM | POA: Diagnosis not present

## 2018-11-18 DIAGNOSIS — M85851 Other specified disorders of bone density and structure, right thigh: Secondary | ICD-10-CM | POA: Diagnosis not present

## 2018-11-30 ENCOUNTER — Other Ambulatory Visit: Payer: Self-pay | Admitting: Family Medicine

## 2018-11-30 MED ORDER — OXYCODONE-ACETAMINOPHEN 10-325 MG PO TABS: 1.0000 | ORAL_TABLET | ORAL | 0 refills | Status: DC | PRN

## 2018-11-30 NOTE — Telephone Encounter (Signed)
Refill on [facility]codone to [facility]  °

## 2018-11-30 NOTE — Telephone Encounter (Signed)
Patient requesting a refill on Oxycodone     LOV: 09/08/18  LRF:  09/21/18

## 2018-12-09 ENCOUNTER — Other Ambulatory Visit: Payer: Self-pay | Admitting: Family Medicine

## 2018-12-09 NOTE — Telephone Encounter (Signed)
RX printed for oxycodone please resend.

## 2018-12-10 MED ORDER — OXYCODONE-ACETAMINOPHEN 10-325 MG PO TABS: 1.0000 | ORAL_TABLET | ORAL | 0 refills | Status: DC | PRN

## 2018-12-28 DIAGNOSIS — H40033 Anatomical narrow angle, bilateral: Secondary | ICD-10-CM | POA: Diagnosis not present

## 2018-12-28 DIAGNOSIS — H353121 Nonexudative age-related macular degeneration, left eye, early dry stage: Secondary | ICD-10-CM | POA: Diagnosis not present

## 2018-12-28 DIAGNOSIS — H40013 Open angle with borderline findings, low risk, bilateral: Secondary | ICD-10-CM | POA: Diagnosis not present

## 2018-12-28 DIAGNOSIS — H353211 Exudative age-related macular degeneration, right eye, with active choroidal neovascularization: Secondary | ICD-10-CM | POA: Diagnosis not present

## 2019-01-11 NOTE — Progress Notes (Signed)
[facility] Note  01/12/2019     CHIEF COMPLAINT Patient presents for Retina Evaluation   HISTORY OF PRESENT ILLNESS: Faith Reeves is a 68 y.o. female who presents to the clinic today for:   HPI    Retina Evaluation    In both eyes.  This started 2 weeks ago.  Duration of 2 weeks.  Associated Symptoms Floaters and Glare.  Context:  distance vision, mid-range vision and near vision.  Treatments tried include no treatments.  I, the attending physician,  performed the HPI with the patient and updated documentation appropriately.          Comments    68 y/o female pt referred by Dr. Kathlen Mody for eval of non-exu ARMD OU.  Last saw Dr. Kathlen Mody 2 wks ago.  Has been seeing Dr. Kathlen Mody every six months for some time.  VA good OU cc, but beginning to get a little blurred OD.  Denies pain, flashes, but c/o glare, especially OD.  Has a few small floaters OU.  No gtts.       Last edited by Bernarda Caffey, MD on 01/12/2019  2:21 PM. (History)    pt states she saw Dr. Kathlen Mody for a 6 month routine exam, she states she told him that her right eye was a little more blurry than previously, pt is aware that she has dry macular degeneration and said that Dr. Kathlen Mody is worried her right eye has converted to the wet type, pt denies flashing lights and floaters  Referring physician: Hortencia Pilar, MD [place], [place] [address]  HISTORICAL INFORMATION:   Selected notes from the MEDICAL RECORD NUMBER Referred by Dr. Quentin Ore for concern of exu ARMD OU LEE: 02.24.20 Read Drivers) [BCVA: OD: 20/70 OS: 20/20-] Ocular Hx-cataract OU, non-ARMD, glaucoma suspect OU, iridotomy OU PMH-HTN    CURRENT MEDICATIONS: No current outpatient medications on file. (Ophthalmic Drugs)   No current facility-administered medications for this visit.  (Ophthalmic Drugs)   Current Outpatient Medications (Other)  Medication Sig  . albuterol (PROVENTIL HFA;VENTOLIN HFA)  108 (90 Base) MCG/ACT inhaler Inhale 2 puffs into the lungs every 4 (four) hours as needed for wheezing or shortness of breath.  Marland Kitchen amoxicillin (AMOXIL) 500 MG capsule Take 2,000 mg by mouth once. Prior to dental work  . celecoxib (CELEBREX) 200 MG capsule TAKE 1 CAPSULE BY MOUTH EVERY DAY  . gabapentin (NEURONTIN) 300 MG capsule Take 1 capsule (300 mg total) by mouth 3 (three) times daily.  Marland Kitchen ipratropium (ATROVENT) 0.06 % nasal spray PLACE 2 SPRAYS INTO EACH NOSTRIL 4 TIMES A DAY  . mometasone (ELOCON) 0.1 % cream Apply 1 application topically daily as needed.  Marland Kitchen oxyCODONE-acetaminophen (PERCOCET) 10-325 MG tablet Take 1 tablet by mouth every 4 (four) hours as needed for pain (G89.4).  . pantoprazole (PROTONIX) 40 MG tablet TAKE 1 TABLET BY MOUTH EVERY DAY  . sertraline (ZOLOFT) 50 MG tablet Take 1.5 tablets (75 mg total) by mouth daily.  Marland Kitchen telmisartan-hydrochlorothiazide (MICARDIS HCT) 80-12.5 MG tablet Take 1 tablet by mouth daily.  . valACYclovir (VALTREX) 1000 MG tablet Take 2 tablets (2,000 mg total) by mouth 2 (two) times daily.  Marland Kitchen zolpidem (AMBIEN) 10 MG tablet TAKE 1 TABLET BY MOUTH EVERY DAY AT BEDTIME AS NEEDED   Current Facility-Administered Medications (Other)  Medication Route  . Bevacizumab (AVASTIN) SOLN 1.25 mg Intravitreal      REVIEW OF SYSTEMS: ROS    Positive for:  Eyes   Negative for: Constitutional, Gastrointestinal, Neurological, Skin, Genitourinary, Musculoskeletal, HENT, Endocrine, Cardiovascular, Respiratory, Psychiatric, Allergic/Imm, Heme/Lymph   Last edited by Matthew Folks, COA on 01/12/2019  2:00 PM. (History)       ALLERGIES Allergies  Allergen Reactions  . Cymbalta [Duloxetine Hcl] Swelling    PAST MEDICAL HISTORY Past Medical History:  Diagnosis Date  . Allergy   . Anemia   . Anxiety   . Blood transfusion without reported diagnosis   . Cataract   . GERD (gastroesophageal reflux disease)   . Hypertension   . Macular degeneration    Past  Surgical History:  Procedure Laterality Date  . ABDOMINAL HYSTERECTOMY    . ACHILLES TENDON REPAIR Bilateral   . APPENDECTOMY    . BACK SURGERY    . BILATERAL CARPAL TUNNEL RELEASE    . CHOLECYSTECTOMY    . COLONOSCOPY    . HERNIA REPAIR    . JOINT REPLACEMENT    . MELANOMA EXCISION Right 09/15/2017   Procedure: EXCISION LIPOSARCOMA RIGHT ARM;  Surgeon: Georganna Skeans, MD;  Location: [facility];  Service: General;  Laterality: Right;  . SPINE SURGERY      FAMILY HISTORY Family History  Problem Relation Age of Onset  . Hypertension Mother   . Cancer Sister   . Stroke Maternal Grandmother     SOCIAL HISTORY Social History   Tobacco Use  . Smoking status: Never Smoker  . Smokeless tobacco: Never Used  Substance Use Topics  . Alcohol use: Yes    Alcohol/week: 1.0 standard drinks    Types: 1 Glasses of wine per week  . Drug use: No         OPHTHALMIC EXAM:  Base Eye Exam    Visual Acuity (Snellen - Linear)      Right Left   Dist cc 20/50 +2 20/20 -   Dist ph cc 20/30 +    Correction:  Glasses       Tonometry (Tonopen, 2:01 PM)      Right Left   Pressure 14 13       Pupils      Dark Light Shape React APD   Right 4 3 Round Brisk None   Left 4 3 Round Brisk None       Visual Fields (Counting fingers)      Left Right    Full Full       Extraocular Movement      Right Left    Full, Ortho Full, Ortho       Neuro/Psych    Oriented x3:  Yes   Mood/Affect:  Normal       Dilation    Both eyes:  1.0% Mydriacyl, 2.5% Phenylephrine @ 2:01 PM        Slit Lamp and Fundus Exam    Slit Lamp Exam      Right Left   Lids/Lashes Dermatochalasis - upper lid, mild Meibomian gland dysfunction Dermatochalasis - upper lid, mild Meibomian gland dysfunction   Conjunctiva/Sclera White and quiet White and quiet   Cornea 2+ diffuse Punctate epithelial erosions 2+ diffuse Punctate epithelial erosions   Anterior Chamber deep and clear deep and clear   Iris Round and  dilated Round and dilated   Lens 2+ Nuclear sclerosis, 2+ Cortical cataract 2+ Nuclear sclerosis, 2+ Cortical cataract   Vitreous Vitreous syneresis, Posterior vitreous detachment Vitreous syneresis       Fundus Exam      Right Left   Disc  Pink and Sharp Pink and Sharp, temporal PPP   C/D Ratio 0.2 0.2   Macula Blunted foveal reflex, drusen, RPE mottling and clumping, +CNV, +shallow SRF Flat, Blunted foveal reflex, scattered Drusen, focal PEDs   Vessels Vascular attenuation Vascular attenuation   Periphery Attached, no heme Attached, no heme        Refraction    Wearing Rx      Sphere Cylinder Axis Add   Right +2.00 +0.50 008 +2.50   Left +2.50 +1.00 154 +2.50   Age:  87yrs   Type:  PAL       Manifest Refraction      Sphere Cylinder Axis Dist VA   Right +2.25 +0.50 045 20/25-2   Left +2.25 +1.00 155 20/20          IMAGING AND PROCEDURES  Imaging and Procedures for @TODAY @  OCT, Retina - OU - Both Eyes       Right Eye Quality was good. Central Foveal Thickness: 267. Progression has no prior data. Findings include normal foveal contour, no IRF, retinal drusen , subretinal fluid, pigment epithelial detachment, outer retinal atrophy (Patchy ORA).   Left Eye Quality was good. Central Foveal Thickness: 270. Progression has no prior data. Findings include normal foveal contour, no SRF, no IRF, retinal drusen .   Notes *Images captured and stored on drive  Diagnosis / Impression:  OD: exu ARMD OS: non-exu ARMD   Clinical management:  See below  Abbreviations: NFP - Normal foveal profile. CME - cystoid macular edema. PED - pigment epithelial detachment. IRF - intraretinal fluid. SRF - subretinal fluid. EZ - ellipsoid zone. ERM - epiretinal membrane. ORA - outer retinal atrophy. ORT - outer retinal tubulation. SRHM - subretinal hyper-reflective material        Fluorescein Angiography Optos (Transit OD)       Right Eye   Progression has no prior data. Early  phase findings include retinal neovascularization, choroidal neovascularization, staining. Mid/Late phase findings include choroidal neovascularization, retinal neovascularization, staining.   Left Eye   Progression has no prior data. Early phase findings include staining. Mid/Late phase findings include staining.   Notes Images stored on drive;   Impression: OD: exudative ARMD with central CNV OS: nonexudative ARMD with staining of drusen         Intravitreal Injection, Pharmacologic Agent - OD - Right Eye       Time Out 01/12/2019. 3:51 PM. Confirmed correct patient, procedure, site, and patient consented.   Anesthesia Topical anesthesia was used. Anesthetic medications included Lidocaine 2%, Proparacaine 0.5%.   Procedure Preparation included 5% betadine to ocular surface, eyelid speculum. A supplied needle was used.   Injection:  1.25 mg Bevacizumab (AVASTIN) SOLN   NDC: 62130-865-78, Lot: [id number] , Expiration date: 03/03/2019   Route: Intravitreal, Site: Right Eye, Waste: 0 mL  Post-op Post injection exam found visual acuity of at least counting fingers. The patient tolerated the procedure well. There were no complications. The patient received written and verbal post procedure care education.                 ASSESSMENT/PLAN:    ICD-10-CM   1. Exudative age-related macular degeneration of right eye with active choroidal neovascularization (HCC) H35.3211 Fluorescein Angiography Optos (Transit OD)    Intravitreal Injection, Pharmacologic Agent - OD - Right Eye    Bevacizumab (AVASTIN) SOLN 1.25 mg  2. Retinal edema H35.81 OCT, Retina - OU - Both Eyes  3. Intermediate stage nonexudative age-related macular degeneration of  left eye H35.3122   4. Essential hypertension I10   5. Hypertensive retinopathy of both eyes H35.033 Fluorescein Angiography Optos (Transit OD)  6. Combined forms of age-related cataract of both eyes H25.813     1,2. Exudative age  related macular degeneration, right eye  - The incidence pathology and anatomy of wet AMD discussed   - The ANCHOR, MARINA, CATT and VIEW trials discussed with patient.    - discussed treatment options including observation vs intravitreal anti-VEGF agents such as Avastin, Lucentis, Eylea.    - Risks of endophthalmitis and vascular occlusive events and atrophic changes discussed with patient  - OCT shows +SRF overlying PED/CNVM  - FA shows +CNV w/ staining/leakage  - recommend IVA OD #1 today, 03.10.20  - pt wishes to be treated with IVA  - RBA of procedure discussed, questions answered  - informed consent obtained and signed  - see procedure note  - f/u in 4 wks -- DFE/OCT/possible injection  3. Age related macular degeneration, non-exudative, left eye  - The incidence, anatomy, and pathology of dry AMD, risk of progression, and the AREDS and AREDS 2 study including smoking risks discussed with patient.  - intermediate stage  - Recommend amsler grid monitoring  4,5. Hypertensive retinopathy OU - discussed importance of tight BP control - monitor  6. - The symptoms of cataract, surgical options, and treatments and risks were discussed with patient. - discussed diagnosis and progression - not yet visually significant - monitor for now  7. Glaucoma Suspect OU - under the expert management of Dr. Kathlen Mody  Ophthalmic Meds Ordered this visit:  Meds ordered this encounter  Medications  . Bevacizumab (AVASTIN) SOLN 1.25 mg       Return for f/u exu ARMD OD, DFE, OCT.  There are no Patient Instructions on file for this visit.   Explained the diagnoses, plan, and follow up with the patient and they expressed understanding.  Patient expressed understanding of the importance of proper follow up care.   This document serves as a record of services personally performed by Gardiner Sleeper, MD, PhD. It was created on their behalf by Ernest Mallick, OA, an ophthalmic assistant. The creation  of this record is the provider's dictation and/or activities during the visit.    Electronically signed by: Ernest Mallick, OA  03.09.2020 4:57 PM    Gardiner Sleeper, M.D., Ph.D. Diseases & Surgery of the Retina and Vitreous [facility]  I have reviewed the above documentation for accuracy and completeness, and I agree with the above. Gardiner Sleeper, M.D., Ph.D. 01/12/19 4:59 PM     Abbreviations: M myopia (nearsighted); A astigmatism; H hyperopia (farsighted); P presbyopia; Mrx spectacle prescription;  CTL contact lenses; OD right eye; OS left eye; OU both eyes  XT exotropia; ET esotropia; PEK punctate epithelial keratitis; PEE punctate epithelial erosions; DES dry eye syndrome; MGD meibomian gland dysfunction; ATs artificial tears; PFAT's preservative free artificial tears; [place] nuclear sclerotic cataract; PSC posterior subcapsular cataract; ERM epi-retinal membrane; PVD posterior vitreous detachment; RD retinal detachment; DM diabetes mellitus; DR diabetic retinopathy; NPDR non-proliferative diabetic retinopathy; PDR proliferative diabetic retinopathy; CSME clinically significant macular edema; DME diabetic macular edema; dbh dot blot hemorrhages; CWS cotton wool spot; POAG primary open angle glaucoma; C/D cup-to-disc ratio; HVF humphrey visual field; GVF goldmann visual field; OCT optical coherence tomography; IOP intraocular pressure; BRVO Branch retinal vein occlusion; CRVO central retinal vein occlusion; CRAO central retinal artery occlusion; BRAO branch retinal artery occlusion;  RT retinal tear; SB scleral buckle; PPV pars plana vitrectomy; VH Vitreous hemorrhage; PRP panretinal laser photocoagulation; IVK intravitreal kenalog; VMT vitreomacular traction; MH Macular hole;  NVD neovascularization of the disc; NVE neovascularization elsewhere; AREDS age related eye disease study; ARMD age related macular degeneration; POAG primary open angle glaucoma; EBMD  epithelial/anterior basement membrane dystrophy; ACIOL anterior chamber intraocular lens; IOL intraocular lens; PCIOL posterior chamber intraocular lens; Phaco/IOL phacoemulsification with intraocular lens placement; [place] photorefractive keratectomy; LASIK laser assisted in situ keratomileusis; HTN hypertension; DM diabetes mellitus; COPD chronic obstructive pulmonary disease

## 2019-01-12 ENCOUNTER — Encounter (INDEPENDENT_AMBULATORY_CARE_PROVIDER_SITE_OTHER): Payer: Self-pay | Admitting: Ophthalmology

## 2019-01-12 ENCOUNTER — Ambulatory Visit (INDEPENDENT_AMBULATORY_CARE_PROVIDER_SITE_OTHER): Payer: Medicare Other | Admitting: Ophthalmology

## 2019-01-12 DIAGNOSIS — H25813 Combined forms of age-related cataract, bilateral: Secondary | ICD-10-CM | POA: Diagnosis not present

## 2019-01-12 DIAGNOSIS — H3581 Retinal edema: Secondary | ICD-10-CM | POA: Diagnosis not present

## 2019-01-12 DIAGNOSIS — H35033 Hypertensive retinopathy, bilateral: Secondary | ICD-10-CM | POA: Diagnosis not present

## 2019-01-12 DIAGNOSIS — I10 Essential (primary) hypertension: Secondary | ICD-10-CM | POA: Diagnosis not present

## 2019-01-12 DIAGNOSIS — H353211 Exudative age-related macular degeneration, right eye, with active choroidal neovascularization: Secondary | ICD-10-CM

## 2019-01-12 DIAGNOSIS — H353122 Nonexudative age-related macular degeneration, left eye, intermediate dry stage: Secondary | ICD-10-CM | POA: Diagnosis not present

## 2019-01-12 MED ORDER — BEVACIZUMAB CHEMO INJECTION 1.25MG/0.05ML SYRINGE FOR KALEIDOSCOPE
1.2500 mg | INTRAVITREAL | Status: DC
  Administered 2019-01-12: 1.25 mg via INTRAVITREAL

## 2019-01-15 ENCOUNTER — Other Ambulatory Visit: Payer: Self-pay | Admitting: *Deleted

## 2019-01-15 DIAGNOSIS — L299 Pruritus, unspecified: Secondary | ICD-10-CM

## 2019-01-15 MED ORDER — MOMETASONE FUROATE 0.1 % EX CREA: 1.0000 "application " | TOPICAL_CREAM | Freq: Every day | CUTANEOUS | 1 refills | Status: DC | PRN

## 2019-01-22 ENCOUNTER — Other Ambulatory Visit: Payer: Self-pay | Admitting: Family Medicine

## 2019-01-22 MED ORDER — ZOLPIDEM TARTRATE 10 MG PO TABS: ORAL_TABLET | ORAL | 2 refills | Status: DC

## 2019-01-22 NOTE — Telephone Encounter (Signed)
Requesting refill    Ambien  LOV: 09/08/18  LRF:  10/26/18

## 2019-01-27 ENCOUNTER — Other Ambulatory Visit: Payer: Self-pay | Admitting: Family Medicine

## 2019-01-27 MED ORDER — ZOLPIDEM TARTRATE 10 MG PO TABS: ORAL_TABLET | ORAL | 2 refills | Status: DC

## 2019-01-27 NOTE — Telephone Encounter (Signed)
Requesting refill Ambien      LOV: 09/08/18  LRF:

## 2019-01-27 NOTE — Telephone Encounter (Signed)
Pt needs refill on ambien to [address] wants 2 refills on it, SHE IS OUT!!

## 2019-02-14 NOTE — Progress Notes (Signed)
[facility] Note  02/15/2019     CHIEF COMPLAINT Patient presents for Retina Evaluation   HISTORY OF PRESENT ILLNESS: Faith Reeves is a 68 y.o. female who presents to the clinic today for:   HPI    Retina Evaluation    In right eye.  This started months ago.  Duration of months.  Associated Symptoms Floaters.  Context:  distance vision.  Response to treatment was mild improvement.  I, the attending physician,  performed the HPI with the patient and updated documentation appropriately.          Comments    Patient states her vision is stable in both eyes.  Patient denies eye pain or discomfort.  Patient denies new floaters or fol OU.  Patient states she only has a few flashes right after her injection but states they go away quickly.       Last edited by Bernarda Caffey, MD on 02/15/2019  9:26 AM. (History)    pt states she did not notice much change in vision after the first injection, pt states she has seen a few flashes of light in her superior vision in her right eye   Referring physician: Susy Frizzle, MD [address] [place], [place] [address]  HISTORICAL INFORMATION:   Selected notes from the MEDICAL RECORD NUMBER Referred by Dr. Quentin Ore for concern of exu ARMD OU LEE: 02.24.20 Read Drivers) [BCVA: OD: 20/70 OS: 20/20-] Ocular Hx-cataract OU, non-ARMD, glaucoma suspect OU, iridotomy OU PMH-HTN    CURRENT MEDICATIONS: No current outpatient medications on file. (Ophthalmic Drugs)   No current facility-administered medications for this visit.  (Ophthalmic Drugs)   Current Outpatient Medications (Other)  Medication Sig  . albuterol (PROVENTIL HFA;VENTOLIN HFA) 108 (90 Base) MCG/ACT inhaler Inhale 2 puffs into the lungs every 4 (four) hours as needed for wheezing or shortness of breath.  Marland Kitchen amoxicillin (AMOXIL) 500 MG capsule Take 2,000 mg by mouth once. Prior to dental work  . celecoxib (CELEBREX) 200 MG capsule TAKE 1 CAPSULE  BY MOUTH EVERY DAY  . gabapentin (NEURONTIN) 300 MG capsule Take 1 capsule (300 mg total) by mouth 3 (three) times daily.  Marland Kitchen ipratropium (ATROVENT) 0.06 % nasal spray PLACE 2 SPRAYS INTO EACH NOSTRIL 4 TIMES A DAY  . mometasone (ELOCON) 0.1 % cream Apply 1 application topically daily as needed.  Marland Kitchen oxyCODONE-acetaminophen (PERCOCET) 10-325 MG tablet Take 1 tablet by mouth every 4 (four) hours as needed for pain (G89.4).  . pantoprazole (PROTONIX) 40 MG tablet TAKE 1 TABLET BY MOUTH EVERY DAY  . sertraline (ZOLOFT) 50 MG tablet Take 1.5 tablets (75 mg total) by mouth daily.  Marland Kitchen telmisartan-hydrochlorothiazide (MICARDIS HCT) 80-12.5 MG tablet Take 1 tablet by mouth daily.  . valACYclovir (VALTREX) 1000 MG tablet Take 2 tablets (2,000 mg total) by mouth 2 (two) times daily.  Marland Kitchen zolpidem (AMBIEN) 10 MG tablet TAKE 1 TABLET BY MOUTH EVERY DAY AT BEDTIME AS NEEDED   Current Facility-Administered Medications (Other)  Medication Route  . Bevacizumab (AVASTIN) SOLN 1.25 mg Intravitreal      REVIEW OF SYSTEMS: ROS    Positive for: Eyes   Negative for: Constitutional, Gastrointestinal, Neurological, Skin, Genitourinary, Musculoskeletal, HENT, Endocrine, Cardiovascular, Respiratory, Psychiatric, Allergic/Imm, Heme/Lymph   Last edited by Doneen Poisson on 02/15/2019  9:08 AM. (History)       ALLERGIES Allergies  Allergen Reactions  . Cymbalta [Duloxetine Hcl] Swelling    PAST MEDICAL HISTORY Past  Medical History:  Diagnosis Date  . Allergy   . Anemia   . Anxiety   . Blood transfusion without reported diagnosis   . Cataract   . GERD (gastroesophageal reflux disease)   . Hypertension   . Macular degeneration    Past Surgical History:  Procedure Laterality Date  . ABDOMINAL HYSTERECTOMY    . ACHILLES TENDON REPAIR Bilateral   . APPENDECTOMY    . BACK SURGERY    . BILATERAL CARPAL TUNNEL RELEASE    . CHOLECYSTECTOMY    . COLONOSCOPY    . HERNIA REPAIR    . JOINT REPLACEMENT    .  MELANOMA EXCISION Right 09/15/2017   Procedure: EXCISION LIPOSARCOMA RIGHT ARM;  Surgeon: Georganna Skeans, MD;  Location: [facility];  Service: General;  Laterality: Right;  . SPINE SURGERY      FAMILY HISTORY Family History  Problem Relation Age of Onset  . Hypertension Mother   . Cancer Sister   . Stroke Maternal Grandmother     SOCIAL HISTORY Social History   Tobacco Use  . Smoking status: Never Smoker  . Smokeless tobacco: Never Used  Substance Use Topics  . Alcohol use: Yes    Alcohol/week: 1.0 standard drinks    Types: 1 Glasses of wine per week  . Drug use: No         OPHTHALMIC EXAM:  Base Eye Exam    Visual Acuity (Snellen - Linear)      Right Left   Dist cc 20/50 -1 20/20 -1   Dist ph cc NI    Correction:  Glasses       Tonometry (Tonopen, 9:09 AM)      Right Left   Pressure 18 20       Pupils      Dark Light Shape React APD   Right 4 3 Round Brisk 0   Left 4 3 Round Brisk 0       Neuro/Psych    Oriented x3:  Yes   Mood/Affect:  Normal       Dilation    Both eyes:  1.0% Mydriacyl, 2.5% Phenylephrine @ 9:09 AM        Slit Lamp and Fundus Exam    Slit Lamp Exam      Right Left   Lids/Lashes Dermatochalasis - upper lid, mild Meibomian gland dysfunction Dermatochalasis - upper lid, mild Meibomian gland dysfunction   Conjunctiva/Sclera White and quiet White and quiet   Cornea 2+ diffuse Punctate epithelial erosions 2+ diffuse Punctate epithelial erosions   Anterior Chamber deep and clear deep and clear   Iris Round and dilated Round and dilated   Lens 2+ Nuclear sclerosis, 2+ Cortical cataract 2+ Nuclear sclerosis, 2+ Cortical cataract   Vitreous Vitreous syneresis, Posterior vitreous detachment Vitreous syneresis       Fundus Exam      Right Left   Disc Pink and Sharp Pink and Sharp, temporal PPP   C/D Ratio 0.2 0.2   Macula Blunted foveal reflex, drusen, RPE mottling and clumping, +CNV, +shallow SRF -- stable to slightly increased from  prior Flat, Blunted foveal reflex, scattered Drusen, focal PEDs   Vessels Vascular attenuation Vascular attenuation   Periphery Attached, no heme Attached, no heme        Refraction    Wearing Rx      Sphere Cylinder Axis Add   Right +2.00 +0.50 008 +2.50   Left +2.50 +1.00 154 +2.50   Type:  PAL  IMAGING AND PROCEDURES  Imaging and Procedures for @TODAY @  OCT, Retina - OU - Both Eyes       Right Eye Quality was good. Central Foveal Thickness: 269. Progression has worsened. Findings include no IRF, retinal drusen , subretinal fluid, pigment epithelial detachment, outer retinal atrophy, abnormal foveal contour (Mild interval Increase SRF).   Left Eye Quality was good. Central Foveal Thickness: 270. Progression has been stable. Findings include normal foveal contour, no SRF, no IRF, retinal drusen .   Notes *Images captured and stored on drive  Diagnosis / Impression:  OD: exu ARMD --Mild interval Increase SRF   OS: non-exu ARMD -- stable   Clinical management:  See below  Abbreviations: NFP - Normal foveal profile. CME - cystoid macular edema. PED - pigment epithelial detachment. IRF - intraretinal fluid. SRF - subretinal fluid. EZ - ellipsoid zone. ERM - epiretinal membrane. ORA - outer retinal atrophy. ORT - outer retinal tubulation. SRHM - subretinal hyper-reflective material        Intravitreal Injection, Pharmacologic Agent - OD - Right Eye       Time Out 02/15/2019. 9:47 AM. Confirmed correct patient, procedure, site, and patient consented.   Anesthesia Topical anesthesia was used. Anesthetic medications included Lidocaine 2%, Proparacaine 0.5%.   Procedure Preparation included 5% betadine to ocular surface, eyelid speculum. A supplied needle was used.   Injection:  1.25 mg Bevacizumab (AVASTIN) SOLN   NDC: 17616-073-71, Lot: [id number] , Expiration date: 03/24/2019   Route: Intravitreal, Site: Right Eye, Waste: 0 mL  Post-op Post injection  exam found visual acuity of at least counting fingers. The patient tolerated the procedure well. There were no complications. The patient received written and verbal post procedure care education.                 ASSESSMENT/PLAN:    ICD-10-CM   1. Exudative age-related macular degeneration of right eye with active choroidal neovascularization (HCC) H35.3211 Intravitreal Injection, Pharmacologic Agent - OD - Right Eye    Bevacizumab (AVASTIN) SOLN 1.25 mg  2. Retinal edema H35.81 OCT, Retina - OU - Both Eyes  3. Intermediate stage nonexudative age-related macular degeneration of left eye H35.3122   4. Essential hypertension I10   5. Hypertensive retinopathy of both eyes H35.033   6. Combined forms of age-related cataract of both eyes H25.813     1,2. Exudative age related macular degeneration, right eye  - FA (03.10.20) shows +CNV w/ staining/leakage  - s/p IVA OD #1 (03.10.20)  - OCT shows interval increase in SRF  - BCVA 20/50 from 20/30   - recommend IVA OD #2 today, 04.13.20  - pt wishes to be treated with IVA  - RBA of procedure discussed, questions answered  - informed consent obtained and signed  - see procedure note  - f/u in 4 wks -- DFE/OCT/possible injection  3. Age related macular degeneration, non-exudative, left eye  - The incidence, anatomy, and pathology of dry AMD, risk of progression, and the AREDS and AREDS 2 study including smoking risks discussed with patient.  - intermediate stage  - Recommend amsler grid monitoring  4,5. Hypertensive retinopathy OU - discussed importance of tight BP control - monitor  6. - The symptoms of cataract, surgical options, and treatments and risks were discussed with patient. - discussed diagnosis and progression - not yet visually significant - monitor for now  7. Glaucoma Suspect OU - under the expert management of Dr. 04.26.20   Ophthalmic Meds Ordered this visit:  Meds  ordered this encounter  Medications  .  Bevacizumab (AVASTIN) SOLN 1.25 mg       Return in about 4 weeks (around 03/15/2019) for f/u ex ARMD OD - Dilated Exam, OCT.  There are no Patient Instructions on file for this visit.   Explained the diagnoses, plan, and follow up with the patient and they expressed understanding.  Patient expressed understanding of the importance of proper follow up care.   This document serves as a record of services personally performed by Gardiner Sleeper, MD, PhD. It was created on their behalf by Ernest Mallick, OA, an ophthalmic assistant. The creation of this record is the provider's dictation and/or activities during the visit.    Electronically signed by: Ernest Mallick, OA  04.12.2020 12:00 PM      Gardiner Sleeper, M.D., Ph.D. Diseases & Surgery of the Retina and Vitreous [facility]  I have reviewed the above documentation for accuracy and completeness, and I agree with the above. Gardiner Sleeper, M.D., Ph.D. 02/15/19 12:02 PM    Abbreviations: M myopia (nearsighted); A astigmatism; H hyperopia (farsighted); P presbyopia; Mrx spectacle prescription;  CTL contact lenses; OD right eye; OS left eye; OU both eyes  XT exotropia; ET esotropia; PEK punctate epithelial keratitis; PEE punctate epithelial erosions; DES dry eye syndrome; MGD meibomian gland dysfunction; ATs artificial tears; PFAT's preservative free artificial tears; [place] nuclear sclerotic cataract; PSC posterior subcapsular cataract; ERM epi-retinal membrane; PVD posterior vitreous detachment; RD retinal detachment; DM diabetes mellitus; DR diabetic retinopathy; NPDR non-proliferative diabetic retinopathy; PDR proliferative diabetic retinopathy; CSME clinically significant macular edema; DME diabetic macular edema; dbh dot blot hemorrhages; CWS cotton wool spot; POAG primary open angle glaucoma; C/D cup-to-disc ratio; HVF humphrey visual field; GVF goldmann visual field; OCT optical coherence tomography; IOP intraocular  pressure; BRVO Branch retinal vein occlusion; CRVO central retinal vein occlusion; CRAO central retinal artery occlusion; BRAO branch retinal artery occlusion; RT retinal tear; SB scleral buckle; PPV pars plana vitrectomy; VH Vitreous hemorrhage; PRP panretinal laser photocoagulation; IVK intravitreal kenalog; VMT vitreomacular traction; MH Macular hole;  NVD neovascularization of the disc; NVE neovascularization elsewhere; AREDS age related eye disease study; ARMD age related macular degeneration; POAG primary open angle glaucoma; EBMD epithelial/anterior basement membrane dystrophy; ACIOL anterior chamber intraocular lens; IOL intraocular lens; PCIOL posterior chamber intraocular lens; Phaco/IOL phacoemulsification with intraocular lens placement; [place] photorefractive keratectomy; LASIK laser assisted in situ keratomileusis; HTN hypertension; DM diabetes mellitus; COPD chronic obstructive pulmonary disease

## 2019-02-15 ENCOUNTER — Ambulatory Visit (INDEPENDENT_AMBULATORY_CARE_PROVIDER_SITE_OTHER): Payer: Medicare Other | Admitting: Ophthalmology

## 2019-02-15 ENCOUNTER — Encounter (INDEPENDENT_AMBULATORY_CARE_PROVIDER_SITE_OTHER): Payer: Self-pay | Admitting: Ophthalmology

## 2019-02-15 ENCOUNTER — Other Ambulatory Visit: Payer: Self-pay

## 2019-02-15 DIAGNOSIS — H353122 Nonexudative age-related macular degeneration, left eye, intermediate dry stage: Secondary | ICD-10-CM

## 2019-02-15 DIAGNOSIS — H3581 Retinal edema: Secondary | ICD-10-CM | POA: Diagnosis not present

## 2019-02-15 DIAGNOSIS — H35033 Hypertensive retinopathy, bilateral: Secondary | ICD-10-CM

## 2019-02-15 DIAGNOSIS — H25813 Combined forms of age-related cataract, bilateral: Secondary | ICD-10-CM

## 2019-02-15 DIAGNOSIS — I10 Essential (primary) hypertension: Secondary | ICD-10-CM

## 2019-02-15 DIAGNOSIS — H353211 Exudative age-related macular degeneration, right eye, with active choroidal neovascularization: Secondary | ICD-10-CM | POA: Diagnosis not present

## 2019-02-15 MED ORDER — BEVACIZUMAB CHEMO INJECTION 1.25MG/0.05ML SYRINGE FOR KALEIDOSCOPE
1.2500 mg | INTRAVITREAL | Status: AC | PRN
  Administered 2019-02-15: 1.25 mg via INTRAVITREAL

## 2019-02-16 ENCOUNTER — Other Ambulatory Visit: Payer: Self-pay | Admitting: Family Medicine

## 2019-02-16 MED ORDER — OXYCODONE-ACETAMINOPHEN 10-325 MG PO TABS: 1.0000 | ORAL_TABLET | ORAL | 0 refills | Status: DC | PRN

## 2019-02-16 NOTE — Telephone Encounter (Signed)
Patient requesting a refill on Oxycodone     LOV: 09/08/18  LRF:   12/10/18

## 2019-02-24 ENCOUNTER — Other Ambulatory Visit: Payer: Self-pay | Admitting: Family Medicine

## 2019-02-24 MED ORDER — SERTRALINE HCL 50 MG PO TABS: 75.0000 mg | ORAL_TABLET | Freq: Every day | ORAL | 3 refills | Status: DC

## 2019-03-14 NOTE — Progress Notes (Signed)
[facility] Note  03/15/2019     CHIEF COMPLAINT Patient presents for Retina Follow Up   HISTORY OF PRESENT ILLNESS: Faith Reeves is a 68 y.o. female who presents to the clinic today for:   HPI    Retina Follow Up    Patient presents with  Dry AMD.  In right eye.  This started 2.  Severity is mild.  Since onset it is gradually improving.  I, the attending physician,  performed the HPI with the patient and updated documentation appropriately.          Comments    F/U EXU ARMD OD. Patient states she thinks her vision has "gotten better,I can see the T.V screen better". Denies new visual onsets.        Last edited by Bernarda Caffey, MD on 03/15/2019  3:23 PM. (History)    pt states she feels like her right eye vision is getting better  Referring physician: Susy Frizzle, MD [address] [place], [place] [address]  HISTORICAL INFORMATION:   Selected notes from the MEDICAL RECORD NUMBER Referred by Dr. Quentin Ore for concern of exu ARMD OU LEE: 02.24.20 Read Drivers) [BCVA: OD: 20/70 OS: 20/20-] Ocular Hx-cataract OU, non-ARMD, glaucoma suspect OU, iridotomy OU PMH-HTN    CURRENT MEDICATIONS: No current outpatient medications on file. (Ophthalmic Drugs)   No current facility-administered medications for this visit.  (Ophthalmic Drugs)   Current Outpatient Medications (Other)  Medication Sig  . albuterol (PROVENTIL HFA;VENTOLIN HFA) 108 (90 Base) MCG/ACT inhaler Inhale 2 puffs into the lungs every 4 (four) hours as needed for wheezing or shortness of breath.  Marland Kitchen amoxicillin (AMOXIL) 500 MG capsule Take 2,000 mg by mouth once. Prior to dental work  . celecoxib (CELEBREX) 200 MG capsule TAKE 1 CAPSULE BY MOUTH EVERY DAY  . gabapentin (NEURONTIN) 300 MG capsule Take 1 capsule (300 mg total) by mouth 3 (three) times daily.  Marland Kitchen ipratropium (ATROVENT) 0.06 % nasal spray PLACE 2 SPRAYS INTO EACH NOSTRIL 4 TIMES A DAY  . mometasone (ELOCON) 0.1 %  cream Apply 1 application topically daily as needed.  Marland Kitchen oxyCODONE-acetaminophen (PERCOCET) 10-325 MG tablet Take 1 tablet by mouth every 4 (four) hours as needed for pain (G89.4).  . pantoprazole (PROTONIX) 40 MG tablet TAKE 1 TABLET BY MOUTH EVERY DAY  . sertraline (ZOLOFT) 50 MG tablet Take 1.5 tablets (75 mg total) by mouth daily.  Marland Kitchen telmisartan-hydrochlorothiazide (MICARDIS HCT) 80-12.5 MG tablet Take 1 tablet by mouth daily.  . valACYclovir (VALTREX) 1000 MG tablet Take 2 tablets (2,000 mg total) by mouth 2 (two) times daily.  Marland Kitchen zolpidem (AMBIEN) 10 MG tablet TAKE 1 TABLET BY MOUTH EVERY DAY AT BEDTIME AS NEEDED   Current Facility-Administered Medications (Other)  Medication Route  . Bevacizumab (AVASTIN) SOLN 1.25 mg Intravitreal      REVIEW OF SYSTEMS: ROS    Negative for: Constitutional, Gastrointestinal, Neurological, Skin, Genitourinary, Musculoskeletal, HENT, Endocrine, Cardiovascular, Eyes, Respiratory, Psychiatric, Allergic/Imm, Heme/Lymph   Last edited by Zenovia Jordan, LPN on 02/28/8340  9:62 PM. (History)       ALLERGIES Allergies  Allergen Reactions  . Cymbalta [Duloxetine Hcl] Swelling    PAST MEDICAL HISTORY Past Medical History:  Diagnosis Date  . Allergy   . Anemia   . Anxiety   . Blood transfusion without reported diagnosis   . Cataract   . GERD (gastroesophageal reflux disease)   . Hypertension   . Macular degeneration  Past Surgical History:  Procedure Laterality Date  . ABDOMINAL HYSTERECTOMY    . ACHILLES TENDON REPAIR Bilateral   . APPENDECTOMY    . BACK SURGERY    . BILATERAL CARPAL TUNNEL RELEASE    . CHOLECYSTECTOMY    . COLONOSCOPY    . HERNIA REPAIR    . JOINT REPLACEMENT    . MELANOMA EXCISION Right 09/15/2017   Procedure: EXCISION LIPOSARCOMA RIGHT ARM;  Surgeon: Georganna Skeans, MD;  Location: [facility];  Service: General;  Laterality: Right;  . SPINE SURGERY      FAMILY HISTORY Family History  Problem Relation Age of Onset   . Hypertension Mother   . Cancer Sister   . Stroke Maternal Grandmother     SOCIAL HISTORY Social History   Tobacco Use  . Smoking status: Never Smoker  . Smokeless tobacco: Never Used  Substance Use Topics  . Alcohol use: Yes    Alcohol/week: 1.0 standard drinks    Types: 1 Glasses of wine per week  . Drug use: No         OPHTHALMIC EXAM:  Base Eye Exam    Visual Acuity (Snellen - Linear)      Right Left   Dist cc 20/40 +2 20/25 -1   Dist ph cc 20/30 +2 20/25   Correction:  Glasses       Tonometry (Tonopen, 2:30 PM)      Right Left   Pressure 14 12       Pupils      Dark Light Shape React APD   Right 4 3 Round Brisk None   Left 4 3 Round Brisk None       Visual Fields (Counting fingers)      Left Right    Full Full       Extraocular Movement      Right Left    Full, Ortho Full, Ortho       Neuro/Psych    Oriented x3:  Yes   Mood/Affect:  Normal       Dilation    Both eyes:  1.0% Mydriacyl @ 2:30 PM        Slit Lamp and Fundus Exam    Slit Lamp Exam      Right Left   Lids/Lashes Dermatochalasis - upper lid, mild Meibomian gland dysfunction Dermatochalasis - upper lid, mild Meibomian gland dysfunction   Conjunctiva/Sclera White and quiet White and quiet   Cornea 2+ diffuse Punctate epithelial erosions 2+ diffuse Punctate epithelial erosions   Anterior Chamber deep and clear deep and clear   Iris Round and dilated Round and dilated   Lens 2+ Nuclear sclerosis, 2+ Cortical cataract 2+ Nuclear sclerosis, 2+ Cortical cataract   Vitreous Vitreous syneresis, Posterior vitreous detachment Vitreous syneresis, Posterior vitreous detachment       Fundus Exam      Right Left   Disc Pink and Sharp Pink and Sharp, temporal PPP   C/D Ratio 0.2 0.2   Macula Blunted foveal reflex, drusen, RPE mottling and clumping, +CNV, +shallow SRF -- improved Flat, Blunted foveal reflex, scattered Drusen, focal PEDs   Vessels Vascular attenuation Vascular attenuation    Periphery Attached, no heme Attached, no heme          IMAGING AND PROCEDURES  Imaging and Procedures for @TODAY @  OCT, Retina - OU - Both Eyes       Right Eye Quality was good. Central Foveal Thickness: 262. Progression has improved. Findings include no IRF, retinal  drusen , pigment epithelial detachment, outer retinal atrophy, normal foveal contour, no SRF (Interval improvement SRF).   Left Eye Quality was good. Central Foveal Thickness: 266. Progression has been stable. Findings include normal foveal contour, no SRF, no IRF, retinal drusen .   Notes *Images captured and stored on drive  Diagnosis / Impression:  OD: exu ARMD --Interval improvement SRF   OS: non-exu ARMD -- stable   Clinical management:  See below  Abbreviations: NFP - Normal foveal profile. CME - cystoid macular edema. PED - pigment epithelial detachment. IRF - intraretinal fluid. SRF - subretinal fluid. EZ - ellipsoid zone. ERM - epiretinal membrane. ORA - outer retinal atrophy. ORT - outer retinal tubulation. SRHM - subretinal hyper-reflective material        Intravitreal Injection, Pharmacologic Agent - OD - Right Eye       Time Out 03/15/2019. 3:30 PM. Confirmed correct patient, procedure, site, and patient consented.   Anesthesia Topical anesthesia was used. Anesthetic medications included Lidocaine 2%, Proparacaine 0.5%.   Procedure Preparation included 5% betadine to ocular surface, eyelid speculum. A supplied needle was used.   Injection:  1.25 mg Bevacizumab (AVASTIN) SOLN   NDC: 12878-676-72, Lot: [id number] , Expiration date: 04/12/2019   Route: Intravitreal, Site: Right Eye, Waste: 0 mL  Post-op Post injection exam found visual acuity of at least counting fingers. The patient tolerated the procedure well. There were no complications. The patient received written and verbal post procedure care education.                 ASSESSMENT/PLAN:    ICD-10-CM   1. Exudative  age-related macular degeneration of right eye with active choroidal neovascularization (HCC) H35.3211 Intravitreal Injection, Pharmacologic Agent - OD - Right Eye    Bevacizumab (AVASTIN) SOLN 1.25 mg  2. Retinal edema H35.81 OCT, Retina - OU - Both Eyes  3. Intermediate stage nonexudative age-related macular degeneration of left eye H35.3122   4. Essential hypertension I10   5. Hypertensive retinopathy of both eyes H35.033   6. Combined forms of age-related cataract of both eyes H25.813     1,2. Exudative age related macular degeneration, right eye  - FA (03.10.20) shows +CNV w/ staining/leakage  - s/p IVA OD #1 (03.10.20), #2 (04.13.20)  - OCT today shows interval improvement in SRF  - BCVA 20/30+2 from 20/50   - recommend IVA OD #3 today, 05.11.20 w/ extension to 4-6 wks  - pt wishes to be treated with IVA  - RBA of procedure discussed, questions answered  - informed consent obtained and signed  - see procedure note  - f/u in 4-6 wks -- DFE/OCT/possible injection  3. Age related macular degeneration, non-exudative, left eye  - The incidence, anatomy, and pathology of dry AMD, risk of progression, and the AREDS and AREDS 2 study including smoking risks discussed with patient.  - intermediate stage  - Recommend amsler grid monitoring  4,5. Hypertensive retinopathy OU  - discussed importance of tight BP control  - monitor  6. - The symptoms of cataract, surgical options, and treatments and risks were discussed with patient. - discussed diagnosis and progression - not yet visually significant - monitor for now  7. Glaucoma Suspect OU - IOP 14, 12 today - under the expert management of Dr. 18.11.20   Ophthalmic Meds Ordered this visit:  Meds ordered this encounter  Medications  . Bevacizumab (AVASTIN) SOLN 1.25 mg       Return for f/u 4-6 weeks, exu ARMD OD, DFE,  OCT.  There are no Patient Instructions on file for this visit.   Explained the diagnoses, plan, and  follow up with the patient and they expressed understanding.  Patient expressed understanding of the importance of proper follow up care.    This document serves as a record of services personally performed by Gardiner Sleeper, MD, PhD. It was created on their behalf by Ernest Mallick, OA, an ophthalmic assistant. The creation of this record is the provider's dictation and/or activities during the visit.    Electronically signed by: Ernest Mallick, OA  05.10.2020 12:25 AM    Gardiner Sleeper, M.D., Ph.D. Diseases & Surgery of the Retina and Vitreous [facility]  I have reviewed the above documentation for accuracy and completeness, and I agree with the above. Gardiner Sleeper, M.D., Ph.D. 03/16/19 12:26 AM     Abbreviations: M myopia (nearsighted); A astigmatism; H hyperopia (farsighted); P presbyopia; Mrx spectacle prescription;  CTL contact lenses; OD right eye; OS left eye; OU both eyes  XT exotropia; ET esotropia; PEK punctate epithelial keratitis; PEE punctate epithelial erosions; DES dry eye syndrome; MGD meibomian gland dysfunction; ATs artificial tears; PFAT's preservative free artificial tears; [place] nuclear sclerotic cataract; PSC posterior subcapsular cataract; ERM epi-retinal membrane; PVD posterior vitreous detachment; RD retinal detachment; DM diabetes mellitus; DR diabetic retinopathy; NPDR non-proliferative diabetic retinopathy; PDR proliferative diabetic retinopathy; CSME clinically significant macular edema; DME diabetic macular edema; dbh dot blot hemorrhages; CWS cotton wool spot; POAG primary open angle glaucoma; C/D cup-to-disc ratio; HVF humphrey visual field; GVF goldmann visual field; OCT optical coherence tomography; IOP intraocular pressure; BRVO Branch retinal vein occlusion; CRVO central retinal vein occlusion; CRAO central retinal artery occlusion; BRAO branch retinal artery occlusion; RT retinal tear; SB scleral buckle; PPV pars plana vitrectomy; VH  Vitreous hemorrhage; PRP panretinal laser photocoagulation; IVK intravitreal kenalog; VMT vitreomacular traction; MH Macular hole;  NVD neovascularization of the disc; NVE neovascularization elsewhere; AREDS age related eye disease study; ARMD age related macular degeneration; POAG primary open angle glaucoma; EBMD epithelial/anterior basement membrane dystrophy; ACIOL anterior chamber intraocular lens; IOL intraocular lens; PCIOL posterior chamber intraocular lens; Phaco/IOL phacoemulsification with intraocular lens placement; [place] photorefractive keratectomy; LASIK laser assisted in situ keratomileusis; HTN hypertension; DM diabetes mellitus; COPD chronic obstructive pulmonary disease

## 2019-03-15 ENCOUNTER — Encounter (INDEPENDENT_AMBULATORY_CARE_PROVIDER_SITE_OTHER): Payer: Self-pay | Admitting: Ophthalmology

## 2019-03-15 ENCOUNTER — Other Ambulatory Visit: Payer: Self-pay

## 2019-03-15 ENCOUNTER — Ambulatory Visit (INDEPENDENT_AMBULATORY_CARE_PROVIDER_SITE_OTHER): Payer: Medicare Other | Admitting: Ophthalmology

## 2019-03-15 DIAGNOSIS — H353211 Exudative age-related macular degeneration, right eye, with active choroidal neovascularization: Secondary | ICD-10-CM

## 2019-03-15 DIAGNOSIS — H35033 Hypertensive retinopathy, bilateral: Secondary | ICD-10-CM | POA: Diagnosis not present

## 2019-03-15 DIAGNOSIS — H25813 Combined forms of age-related cataract, bilateral: Secondary | ICD-10-CM

## 2019-03-15 DIAGNOSIS — I10 Essential (primary) hypertension: Secondary | ICD-10-CM | POA: Diagnosis not present

## 2019-03-15 DIAGNOSIS — H3581 Retinal edema: Secondary | ICD-10-CM

## 2019-03-15 DIAGNOSIS — H353122 Nonexudative age-related macular degeneration, left eye, intermediate dry stage: Secondary | ICD-10-CM

## 2019-03-16 MED ORDER — BEVACIZUMAB CHEMO INJECTION 1.25MG/0.05ML SYRINGE FOR KALEIDOSCOPE
1.2500 mg | INTRAVITREAL | Status: AC | PRN
  Administered 2019-03-16: 1.25 mg via INTRAVITREAL

## 2019-04-21 ENCOUNTER — Other Ambulatory Visit: Payer: Self-pay | Admitting: Family Medicine

## 2019-04-21 NOTE — Telephone Encounter (Signed)
Requesting refill    Ambien  LOV: 09/08/18  LRF:  01/27/19

## 2019-04-26 NOTE — Progress Notes (Addendum)
[facility] Note  04/27/2019     CHIEF COMPLAINT Patient presents for Retina Follow Up   HISTORY OF PRESENT ILLNESS: Faith Reeves is a 68 y.o. female who presents to the clinic today for:   HPI    Retina Follow Up    Patient presents with  Wet AMD.  In right eye.  Severity is moderate.  Duration of 6 weeks.  Since onset it is gradually improving.  I, the attending physician,  performed the HPI with the patient and updated documentation appropriately.          Comments    6 week ret eval for wet armd od. Patient states vision is some better. Patient has a dear drop she see's since last injection.       Last edited by Bernarda Caffey, MD on 04/27/2019  1:22 PM. (History)    pt states she feels like her right eye vision is getting better  Referring physician: Susy Frizzle, MD [address] [place],  [place] [address]  HISTORICAL INFORMATION:   Selected notes from the MEDICAL RECORD NUMBER Referred by Dr. Quentin Ore for concern of exu ARMD OU LEE: 02.24.20 Read Drivers) [BCVA: OD: 20/70 OS: 20/20-] Ocular Hx-cataract OU, non-ARMD, glaucoma suspect OU, iridotomy OU PMH-HTN    CURRENT MEDICATIONS: No current outpatient medications on file. (Ophthalmic Drugs)   No current facility-administered medications for this visit.  (Ophthalmic Drugs)   Current Outpatient Medications (Other)  Medication Sig  . albuterol (PROVENTIL HFA;VENTOLIN HFA) 108 (90 Base) MCG/ACT inhaler Inhale 2 puffs into the lungs every 4 (four) hours as needed for wheezing or shortness of breath.  Marland Kitchen amoxicillin (AMOXIL) 500 MG capsule Take 2,000 mg by mouth once. Prior to dental work  . celecoxib (CELEBREX) 200 MG capsule TAKE 1 CAPSULE BY MOUTH EVERY DAY  . gabapentin (NEURONTIN) 300 MG capsule Take 1 capsule (300 mg total) by mouth 3 (three) times daily.  Marland Kitchen ipratropium (ATROVENT) 0.06 % nasal spray PLACE 2 SPRAYS INTO EACH NOSTRIL 4 TIMES A DAY  . mometasone (ELOCON)  0.1 % cream Apply 1 application topically daily as needed.  Marland Kitchen oxyCODONE-acetaminophen (PERCOCET) 10-325 MG tablet Take 1 tablet by mouth every 4 (four) hours as needed for pain (G89.4).  . pantoprazole (PROTONIX) 40 MG tablet TAKE 1 TABLET BY MOUTH EVERY DAY  . sertraline (ZOLOFT) 50 MG tablet Take 1.5 tablets (75 mg total) by mouth daily.  Marland Kitchen telmisartan-hydrochlorothiazide (MICARDIS HCT) 80-12.5 MG tablet Take 1 tablet by mouth daily.  . valACYclovir (VALTREX) 1000 MG tablet Take 2 tablets (2,000 mg total) by mouth 2 (two) times daily.  Marland Kitchen zolpidem (AMBIEN) 10 MG tablet TAKE 1 TABLET BY MOUTH EVERY DAY AT BEDTIME AS NEEDED   Current Facility-Administered Medications (Other)  Medication Route  . Bevacizumab (AVASTIN) SOLN 1.25 mg Intravitreal      REVIEW OF SYSTEMS: ROS    Positive for: Eyes   Negative for: Constitutional, Gastrointestinal, Neurological, Skin, Genitourinary, Musculoskeletal, HENT, Endocrine, Cardiovascular, Respiratory, Psychiatric, Allergic/Imm, Heme/Lymph   Last edited by Elmore Guise on 04/27/2019  1:04 PM. (History)       ALLERGIES Allergies  Allergen Reactions  . Cymbalta [Duloxetine Hcl] Swelling    PAST MEDICAL HISTORY Past Medical History:  Diagnosis Date  . Allergy   . Anemia   . Anxiety   . Blood transfusion without reported diagnosis   . Cataract   . GERD (gastroesophageal reflux disease)   . Hypertension   .  Macular degeneration    Past Surgical History:  Procedure Laterality Date  . ABDOMINAL HYSTERECTOMY    . ACHILLES TENDON REPAIR Bilateral   . APPENDECTOMY    . BACK SURGERY    . BILATERAL CARPAL TUNNEL RELEASE    . CHOLECYSTECTOMY    . COLONOSCOPY    . HERNIA REPAIR    . JOINT REPLACEMENT    . MELANOMA EXCISION Right 09/15/2017   Procedure: EXCISION LIPOSARCOMA RIGHT ARM;  Surgeon: Georganna Skeans, MD;  Location: [facility];  Service: General;  Laterality: Right;  . SPINE SURGERY      FAMILY HISTORY Family History  Problem  Relation Age of Onset  . Hypertension Mother   . Cancer Sister   . Stroke Maternal Grandmother     SOCIAL HISTORY Social History   Tobacco Use  . Smoking status: Never Smoker  . Smokeless tobacco: Never Used  Substance Use Topics  . Alcohol use: Yes    Alcohol/week: 1.0 standard drinks    Types: 1 Glasses of wine per week  . Drug use: No         OPHTHALMIC EXAM:  Base Eye Exam    Visual Acuity (Snellen - Linear)      Right Left   Dist cc 20/30-2 20/20-3   Dist ph cc 20/NI        Tonometry (Tonopen, 1:05 PM)      Right Left   Pressure 14 16       Pupils      Dark Light Shape React APD   Right 4 3 Round Brisk None   Left 4 3 Round Brisk None       Visual Fields (Counting fingers)      Left Right    Full Full       Extraocular Movement      Right Left    Full, Ortho Full, Ortho       Neuro/Psych    Oriented x3: Yes   Mood/Affect: Normal       Dilation    Both eyes: 1.0% Mydriacyl, 2.5% Phenylephrine @ 1:05 PM        Slit Lamp and Fundus Exam    Slit Lamp Exam      Right Left   Lids/Lashes Dermatochalasis - upper lid, mild Meibomian gland dysfunction Dermatochalasis - upper lid, mild Meibomian gland dysfunction   Conjunctiva/Sclera White and quiet White and quiet   Cornea 2+ diffuse Punctate epithelial erosions 2+ diffuse Punctate epithelial erosions   Anterior Chamber deep and clear deep and clear   Iris Round and dilated Round and dilated   Lens 2-3+ Nuclear sclerosis, 2-3+ Cortical cataract 2+ Nuclear sclerosis, 2+ Cortical cataract   Vitreous Vitreous syneresis, Posterior vitreous detachment Vitreous syneresis, Posterior vitreous detachment       Fundus Exam      Right Left   Disc Pink and Sharp Pink and Sharp, temporal PPP   C/D Ratio 0.2 0.2   Macula Blunted foveal reflex, drusen, RPE mottling, clumping and atrophy +CNV, shallow SRF resolved Flat, Blunted foveal reflex, scattered Drusen, focal PEDs   Vessels Vascular attenuation  Vascular attenuation   Periphery Attached, no heme Attached, no heme        Refraction    Wearing Rx      Sphere Cylinder Axis Add   Right +2.00 +0.50 008 +2.50   Left +2.50 +1.00 154 +2.50   Type: PAL          IMAGING AND PROCEDURES  Imaging and Procedures for @TODAY @  OCT, Retina - OU - Both Eyes       Right Eye Quality was good. Central Foveal Thickness: 222. Progression has been stable. Findings include no IRF, retinal drusen , pigment epithelial detachment, outer retinal atrophy, normal foveal contour, no SRF (Stable resolution of SRF; persistent ORA/drusen).   Left Eye Quality was good. Central Foveal Thickness: 270. Progression has been stable. Findings include normal foveal contour, no SRF, no IRF, retinal drusen .   Notes *Images captured and stored on drive  Diagnosis / Impression:  OD: exu ARMD -- Stable resolution of SRF; persistent ORA/drusen OS: non-exu ARMD -- stable   Clinical management:  See below  Abbreviations: NFP - Normal foveal profile. CME - cystoid macular edema. PED - pigment epithelial detachment. IRF - intraretinal fluid. SRF - subretinal fluid. EZ - ellipsoid zone. ERM - epiretinal membrane. ORA - outer retinal atrophy. ORT - outer retinal tubulation. SRHM - subretinal hyper-reflective material        Intravitreal Injection, Pharmacologic Agent - OD - Right Eye       Time Out 04/27/2019. Confirmed correct patient, procedure, site, and patient consented.   Anesthesia Topical anesthesia was used. Anesthetic medications included Lidocaine 2%, Proparacaine 0.5%.   Procedure Preparation included 5% betadine to ocular surface, eyelid speculum. A supplied needle was used.   Injection:  1.25 mg Bevacizumab (AVASTIN) SOLN   NDC: 41660-630-16, Lot: [id number] , Expiration date: 06/16/2019   Route: Intravitreal, Site: Right Eye, Waste: 0 mL  Post-op Post injection exam found visual acuity of at least counting fingers. The patient  tolerated the procedure well. There were no complications. The patient received written and verbal post procedure care education.                 ASSESSMENT/PLAN:    ICD-10-CM   1. Exudative age-related macular degeneration of right eye with active choroidal neovascularization (HCC)  H35.3211 Intravitreal Injection, Pharmacologic Agent - OD - Right Eye  2. Retinal edema  H35.81 OCT, Retina - OU - Both Eyes  3. Intermediate stage nonexudative age-related macular degeneration of left eye  H35.3122   4. Essential hypertension  I10   5. Hypertensive retinopathy of both eyes  H35.033   6. Combined forms of age-related cataract of both eyes  H25.813     1,2. Exudative age related macular degeneration, right eye  - FA (03.10.20) shows +CNV w/ staining/leakage  - s/p IVA OD #1 (03.10.20), #2 (04.13.20),  #3 (05.11.20)  - OCT today shows stable resolution of SRF  - BCVA stable at 20/30+2 from 20/50   - recommend IVA OD #4 today, 06.23.20 w/ extension to 8 wks  - pt wishes to be treated with IVA  - RBA of procedure discussed, questions answered  - informed consent obtained and signed  - see procedure note  - f/u in 8 wks -- DFE/OCT/possible injection  3. Age related macular degeneration, non-exudative, left eye  - The incidence, anatomy, and pathology of dry AMD, risk of progression, and the AREDS and AREDS 2 study including smoking risks discussed with patient.  - intermediate stage  - recommend amsler grid monitoring  4,5. Hypertensive retinopathy OU  - discussed importance of tight BP control  - monitor  6. - The symptoms of cataract, surgical options, and treatments and risks were discussed with patient. - discussed diagnosis and progression - not yet visually significant - monitor for now  7. Glaucoma Suspect OU - IOP 14, 16 today -  under the expert management of Dr. Kathlen Mody   Ophthalmic Meds Ordered this visit:  No orders of the defined types were placed in this  encounter.      Return in about 8 weeks (around 06/22/2019) for f/u ex ARMD OD - Dilated Exam, OCT, Possible Injxn.  There are no Patient Instructions on file for this visit.   Explained the diagnoses, plan, and follow up with the patient and they expressed understanding.  Patient expressed understanding of the importance of proper follow up care.    This document serves as a record of services personally performed by Gardiner Sleeper, MD, PhD. It was created on their behalf by Ernest Mallick, OA, an ophthalmic assistant. The creation of this record is the provider's dictation and/or activities during the visit.    Electronically signed by: Ernest Mallick, OA  06.22.2020 1:57 PM     Gardiner Sleeper, M.D., Ph.D. Diseases & Surgery of the Retina and Vitreous [facility]   I have reviewed the above documentation for accuracy and completeness, and I agree with the above. Gardiner Sleeper, M.D., Ph.D. 04/27/19 1:57 PM     Abbreviations: M myopia (nearsighted); A astigmatism; H hyperopia (farsighted); P presbyopia; Mrx spectacle prescription;  CTL contact lenses; OD right eye; OS left eye; OU both eyes  XT exotropia; ET esotropia; PEK punctate epithelial keratitis; PEE punctate epithelial erosions; DES dry eye syndrome; MGD meibomian gland dysfunction; ATs artificial tears; PFAT's preservative free artificial tears; [place] nuclear sclerotic cataract; PSC posterior subcapsular cataract; ERM epi-retinal membrane; PVD posterior vitreous detachment; RD retinal detachment; DM diabetes mellitus; DR diabetic retinopathy; NPDR non-proliferative diabetic retinopathy; PDR proliferative diabetic retinopathy; CSME clinically significant macular edema; DME diabetic macular edema; dbh dot blot hemorrhages; CWS cotton wool spot; POAG primary open angle glaucoma; C/D cup-to-disc ratio; HVF humphrey visual field; GVF goldmann visual field; OCT optical coherence tomography; IOP intraocular  pressure; BRVO Branch retinal vein occlusion; CRVO central retinal vein occlusion; CRAO central retinal artery occlusion; BRAO branch retinal artery occlusion; RT retinal tear; SB scleral buckle; PPV pars plana vitrectomy; VH Vitreous hemorrhage; PRP panretinal laser photocoagulation; IVK intravitreal kenalog; VMT vitreomacular traction; MH Macular hole;  NVD neovascularization of the disc; NVE neovascularization elsewhere; AREDS age related eye disease study; ARMD age related macular degeneration; POAG primary open angle glaucoma; EBMD epithelial/anterior basement membrane dystrophy; ACIOL anterior chamber intraocular lens; IOL intraocular lens; PCIOL posterior chamber intraocular lens; Phaco/IOL phacoemulsification with intraocular lens placement; [place] photorefractive keratectomy; LASIK laser assisted in situ keratomileusis; HTN hypertension; DM diabetes mellitus; COPD chronic obstructive pulmonary disease

## 2019-04-27 ENCOUNTER — Encounter (INDEPENDENT_AMBULATORY_CARE_PROVIDER_SITE_OTHER): Payer: Self-pay | Admitting: Ophthalmology

## 2019-04-27 ENCOUNTER — Other Ambulatory Visit: Payer: Self-pay

## 2019-04-27 ENCOUNTER — Ambulatory Visit (INDEPENDENT_AMBULATORY_CARE_PROVIDER_SITE_OTHER): Payer: Medicare Other | Admitting: Ophthalmology

## 2019-04-27 DIAGNOSIS — H25813 Combined forms of age-related cataract, bilateral: Secondary | ICD-10-CM

## 2019-04-27 DIAGNOSIS — H353211 Exudative age-related macular degeneration, right eye, with active choroidal neovascularization: Secondary | ICD-10-CM

## 2019-04-27 DIAGNOSIS — I10 Essential (primary) hypertension: Secondary | ICD-10-CM

## 2019-04-27 DIAGNOSIS — H35033 Hypertensive retinopathy, bilateral: Secondary | ICD-10-CM | POA: Diagnosis not present

## 2019-04-27 DIAGNOSIS — H353122 Nonexudative age-related macular degeneration, left eye, intermediate dry stage: Secondary | ICD-10-CM

## 2019-04-27 DIAGNOSIS — H3581 Retinal edema: Secondary | ICD-10-CM | POA: Diagnosis not present

## 2019-04-27 MED ORDER — BEVACIZUMAB CHEMO INJECTION 1.25MG/0.05ML SYRINGE FOR KALEIDOSCOPE
1.2500 mg | INTRAVITREAL | Status: AC | PRN
  Administered 2019-04-27: 1.25 mg via INTRAVITREAL

## 2019-05-19 ENCOUNTER — Other Ambulatory Visit: Payer: Self-pay | Admitting: Family Medicine

## 2019-05-19 NOTE — Telephone Encounter (Signed)
Refill on [facility]codone to [facility]  °

## 2019-05-19 NOTE — Telephone Encounter (Signed)
Patient requesting a refill on Oxycodone     LOV: 09/08/18  LRF:   02/16/19

## 2019-05-20 MED ORDER — OXYCODONE-ACETAMINOPHEN 10-325 MG PO TABS: 1.0000 | ORAL_TABLET | ORAL | 0 refills | Status: DC | PRN

## 2019-06-03 ENCOUNTER — Other Ambulatory Visit: Payer: Self-pay | Admitting: Family Medicine

## 2019-06-21 NOTE — Progress Notes (Signed)
[facility] Note  06/22/2019     CHIEF COMPLAINT Patient presents for Retina Follow Up   HISTORY OF PRESENT ILLNESS: Faith Reeves is a 68 y.o. female who presents to the clinic today for:   HPI    Retina Follow Up    Patient presents with  Wet AMD.  In right eye.  Severity is moderate.  Duration of 8 weeks.  Since onset it is stable.  I, the attending physician,  performed the HPI with the patient and updated documentation appropriately.          Comments    8 week retina follow up for wet armd od. Patient states no change in vision       Last edited by Bernarda Caffey, MD on 06/22/2019  5:26 PM. (History)    pt states   Referring physician: Susy Frizzle, MD [address] [place],  [place] [address]  HISTORICAL INFORMATION:   Selected notes from the MEDICAL RECORD NUMBER Referred by Dr. Quentin Ore for concern of exu ARMD OU LEE: 02.24.20 (CKathlen Mody) [BCVA: OD: 20/70 OS: 20/20-] Ocular Hx-cataract OU, non-ARMD, glaucoma suspect OU, iridotomy OU PMH-HTN    CURRENT MEDICATIONS: No current outpatient medications on file. (Ophthalmic Drugs)   No current facility-administered medications for this visit.  (Ophthalmic Drugs)   Current Outpatient Medications (Other)  Medication Sig  . albuterol (PROVENTIL HFA;VENTOLIN HFA) 108 (90 Base) MCG/ACT inhaler Inhale 2 puffs into the lungs every 4 (four) hours as needed for wheezing or shortness of breath.  Marland Kitchen amoxicillin (AMOXIL) 500 MG capsule Take 2,000 mg by mouth once. Prior to dental work  . celecoxib (CELEBREX) 200 MG capsule TAKE 1 CAPSULE BY MOUTH EVERY DAY  . gabapentin (NEURONTIN) 300 MG capsule TAKE 1 CAPSULE BY MOUTH THREE TIMES A DAY  . ipratropium (ATROVENT) 0.06 % nasal spray PLACE 2 SPRAYS INTO EACH NOSTRIL 4 TIMES A DAY  . mometasone (ELOCON) 0.1 % cream Apply 1 application topically daily as needed.  Marland Kitchen oxyCODONE-acetaminophen (PERCOCET) 10-325 MG tablet Take 1 tablet by mouth  every 4 (four) hours as needed for pain (G89.4).  . pantoprazole (PROTONIX) 40 MG tablet TAKE 1 TABLET BY MOUTH EVERY DAY  . sertraline (ZOLOFT) 50 MG tablet Take 1.5 tablets (75 mg total) by mouth daily.  Marland Kitchen telmisartan-hydrochlorothiazide (MICARDIS HCT) 80-12.5 MG tablet Take 1 tablet by mouth daily.  . valACYclovir (VALTREX) 1000 MG tablet Take 2 tablets (2,000 mg total) by mouth 2 (two) times daily.  Marland Kitchen zolpidem (AMBIEN) 10 MG tablet TAKE 1 TABLET BY MOUTH EVERY DAY AT BEDTIME AS NEEDED   Current Facility-Administered Medications (Other)  Medication Route  . Bevacizumab (AVASTIN) SOLN 1.25 mg Intravitreal      REVIEW OF SYSTEMS: ROS    Positive for: Eyes   Negative for: Constitutional, Gastrointestinal, Neurological, Skin, Genitourinary, Musculoskeletal, HENT, Endocrine, Cardiovascular, Respiratory, Psychiatric, Allergic/Imm, Heme/Lymph   Last edited by Elmore Guise on 06/22/2019  1:39 PM. (History)       ALLERGIES Allergies  Allergen Reactions  . Cymbalta [Duloxetine Hcl] Swelling    PAST MEDICAL HISTORY Past Medical History:  Diagnosis Date  . Allergy   . Anemia   . Anxiety   . Blood transfusion without reported diagnosis   . Cataract   . GERD (gastroesophageal reflux disease)   . Hypertension   . Macular degeneration    Past Surgical History:  Procedure Laterality Date  . ABDOMINAL HYSTERECTOMY    .  ACHILLES TENDON REPAIR Bilateral   . APPENDECTOMY    . BACK SURGERY    . BILATERAL CARPAL TUNNEL RELEASE    . CHOLECYSTECTOMY    . COLONOSCOPY    . HERNIA REPAIR    . JOINT REPLACEMENT    . MELANOMA EXCISION Right 09/15/2017   Procedure: EXCISION LIPOSARCOMA RIGHT ARM;  Surgeon: Georganna Skeans, MD;  Location: [facility];  Service: General;  Laterality: Right;  . SPINE SURGERY      FAMILY HISTORY Family History  Problem Relation Age of Onset  . Hypertension Mother   . Cancer Sister   . Stroke Maternal Grandmother     SOCIAL HISTORY Social History    Tobacco Use  . Smoking status: Never Smoker  . Smokeless tobacco: Never Used  Substance Use Topics  . Alcohol use: Yes    Alcohol/week: 1.0 standard drinks    Types: 1 Glasses of wine per week  . Drug use: No         OPHTHALMIC EXAM:  Base Eye Exam    Visual Acuity (Snellen - Linear)      Right Left   Dist cc 20/40 20/20   Dist ph cc 20/30-1    Correction: Glasses       Tonometry (Tonopen, 1:40 PM)      Right Left   Pressure 13 16       Pupils      Dark Light Shape React APD   Right 4 3 Round Brisk None   Left 4 3 Round Brisk None       Visual Fields (Counting fingers)      Left Right    Full Full       Extraocular Movement      Right Left    Full, Ortho Full, Ortho       Neuro/Psych    Oriented x3: Yes   Mood/Affect: Normal       Dilation    Both eyes: 1.0% Mydriacyl, 2.5% Phenylephrine @ 1:39 PM        Slit Lamp and Fundus Exam    Slit Lamp Exam      Right Left   Lids/Lashes Dermatochalasis - upper lid, mild Meibomian gland dysfunction Dermatochalasis - upper lid, mild Meibomian gland dysfunction   Conjunctiva/Sclera White and quiet White and quiet   Cornea 2+ diffuse Punctate epithelial erosions 2+ diffuse Punctate epithelial erosions   Anterior Chamber deep and clear deep and clear   Iris Round and dilated Round and dilated   Lens 2-3+ Nuclear sclerosis, 2-3+ Cortical cataract 2+ Nuclear sclerosis, 2+ Cortical cataract   Vitreous Vitreous syneresis, Posterior vitreous detachment Vitreous syneresis, Posterior vitreous detachment       Fundus Exam      Right Left   Disc Pink and Sharp Pink and Sharp, temporal PPP   C/D Ratio 0.2 0.2   Macula Blunted foveal reflex, drusen, RPE mottling, clumping and atrophy, +CNV, shallow SRF resolved Flat, Blunted foveal reflex, scattered Drusen, focal PEDs   Vessels Vascular attenuation Vascular attenuation   Periphery Attached, no heme Attached, no heme        Refraction    Wearing Rx      Sphere  Cylinder Axis Add   Right +2.00 +0.50 008 +2.50   Left +2.50 +1.00 154 +2.50   Type: PAL          IMAGING AND PROCEDURES  Imaging and Procedures for @TODAY @  OCT, Retina - OU - Both Eyes  Right Eye Quality was good. Central Foveal Thickness: 228. Progression has been stable. Findings include no IRF, retinal drusen , pigment epithelial detachment, outer retinal atrophy, normal foveal contour, no SRF (Stable resolution of SRF; persistent ORA/drusen).   Left Eye Quality was good. Central Foveal Thickness: 269. Progression has been stable. Findings include normal foveal contour, no SRF, no IRF, retinal drusen .   Notes *Images captured and stored on drive  Diagnosis / Impression:  OD: exu ARMD -- Stable resolution of SRF; persistent ORA/drusen OS: non-exu ARMD -- stable   Clinical management:  See below  Abbreviations: NFP - Normal foveal profile. CME - cystoid macular edema. PED - pigment epithelial detachment. IRF - intraretinal fluid. SRF - subretinal fluid. EZ - ellipsoid zone. ERM - epiretinal membrane. ORA - outer retinal atrophy. ORT - outer retinal tubulation. SRHM - subretinal hyper-reflective material        Intravitreal Injection, Pharmacologic Agent - OD - Right Eye       Time Out 06/22/2019. 1:59 PM. Confirmed correct patient, procedure, site, and patient consented.   Anesthesia Topical anesthesia was used. Anesthetic medications included Lidocaine 2%, Proparacaine 0.5%.   Procedure Preparation included 5% betadine to ocular surface, eyelid speculum. A supplied needle was used.   Injection:  1.25 mg Bevacizumab (AVASTIN) SOLN   NDC: 16109-604-54, Lot: [id number] , Expiration date: 08/18/2019   Route: Intravitreal, Site: Right Eye, Waste: 0 mL  Post-op Post injection exam found visual acuity of at least counting fingers. The patient tolerated the procedure well. There were no complications. The patient received written and verbal post procedure  care education.                 ASSESSMENT/PLAN:    ICD-10-CM   1. Exudative age-related macular degeneration of right eye with active choroidal neovascularization (HCC)  H35.3211 Intravitreal Injection, Pharmacologic Agent - OD - Right Eye    Bevacizumab (AVASTIN) SOLN 1.25 mg  2. Retinal edema  H35.81 OCT, Retina - OU - Both Eyes  3. Intermediate stage nonexudative age-related macular degeneration of left eye  H35.3122   4. Essential hypertension  I10   5. Hypertensive retinopathy of both eyes  H35.033   6. Combined forms of age-related cataract of both eyes  H25.813     1,2. Exudative age related macular degeneration, right eye  - FA (03.10.20) shows +CNV w/ staining/leakage  - s/p IVA OD #1 (03.10.20), #2 (04.13.20), #3 (05.11.20), #4 (06.23.20)  - OCT today shows stable resolution of SRF w/ extension to 8 wks  - BCVA stable at 20/30-1  - recommend IVA OD #5 today, 08.18.20 w/ extension to 10 wks  - pt wishes to be treated with IVA  - RBA of procedure discussed, questions answered  - informed consent obtained and signed  - see procedure note  - f/u in 10 wks -- DFE/OCT/possible injection (treat and extend)  3. Age related macular degeneration, non-exudative, left eye  - The incidence, anatomy, and pathology of dry AMD, risk of progression, and the AREDS and AREDS 2 study including smoking risks discussed with patient.  - intermediate stage  - recommend amsler grid monitoring  4,5. Hypertensive retinopathy OU  - discussed importance of tight BP control  - monitor  6. Mixed form age related cataracts OU  - The symptoms of cataract, surgical options, and treatments and risks were discussed with patient.  - discussed diagnosis and progression  - not yet visually significant  - monitor for now  7. Glaucoma Suspect  OU  - IOP 13, 16 today  - under the expert management of Dr. Kathlen Mody   Ophthalmic Meds Ordered this visit:  Meds ordered this encounter  Medications   . Bevacizumab (AVASTIN) SOLN 1.25 mg       Return in about 10 weeks (around 08/31/2019) for f/u exu ARMD OD, DFE, OCT.  There are no Patient Instructions on file for this visit.   Explained the diagnoses, plan, and follow up with the patient and they expressed understanding.  Patient expressed understanding of the importance of proper follow up care.    This document serves as a record of services personally performed by Gardiner Sleeper, MD, PhD. It was created on their behalf by Ernest Mallick, OA, an ophthalmic assistant. The creation of this record is the provider's dictation and/or activities during the visit.    Electronically signed by: Ernest Mallick, OA 08.17.2020 5:27 PM    Gardiner Sleeper, M.D., Ph.D. Diseases & Surgery of the Retina and Vitreous [facility]  I have reviewed the above documentation for accuracy and completeness, and I agree with the above. Gardiner Sleeper, M.D., Ph.D. 06/22/19 5:28 PM    Abbreviations: M myopia (nearsighted); A astigmatism; H hyperopia (farsighted); P presbyopia; Mrx spectacle prescription;  CTL contact lenses; OD right eye; OS left eye; OU both eyes  XT exotropia; ET esotropia; PEK punctate epithelial keratitis; PEE punctate epithelial erosions; DES dry eye syndrome; MGD meibomian gland dysfunction; ATs artificial tears; PFAT's preservative free artificial tears; [place] nuclear sclerotic cataract; PSC posterior subcapsular cataract; ERM epi-retinal membrane; PVD posterior vitreous detachment; RD retinal detachment; DM diabetes mellitus; DR diabetic retinopathy; NPDR non-proliferative diabetic retinopathy; PDR proliferative diabetic retinopathy; CSME clinically significant macular edema; DME diabetic macular edema; dbh dot blot hemorrhages; CWS cotton wool spot; POAG primary open angle glaucoma; C/D cup-to-disc ratio; HVF humphrey visual field; GVF goldmann visual field; OCT optical coherence tomography; IOP intraocular  pressure; BRVO Branch retinal vein occlusion; CRVO central retinal vein occlusion; CRAO central retinal artery occlusion; BRAO branch retinal artery occlusion; RT retinal tear; SB scleral buckle; PPV pars plana vitrectomy; VH Vitreous hemorrhage; PRP panretinal laser photocoagulation; IVK intravitreal kenalog; VMT vitreomacular traction; MH Macular hole;  NVD neovascularization of the disc; NVE neovascularization elsewhere; AREDS age related eye disease study; ARMD age related macular degeneration; POAG primary open angle glaucoma; EBMD epithelial/anterior basement membrane dystrophy; ACIOL anterior chamber intraocular lens; IOL intraocular lens; PCIOL posterior chamber intraocular lens; Phaco/IOL phacoemulsification with intraocular lens placement; [place] photorefractive keratectomy; LASIK laser assisted in situ keratomileusis; HTN hypertension; DM diabetes mellitus; COPD chronic obstructive pulmonary disease

## 2019-06-22 ENCOUNTER — Encounter (INDEPENDENT_AMBULATORY_CARE_PROVIDER_SITE_OTHER): Payer: Self-pay | Admitting: Ophthalmology

## 2019-06-22 ENCOUNTER — Ambulatory Visit (INDEPENDENT_AMBULATORY_CARE_PROVIDER_SITE_OTHER): Payer: Medicare Other | Admitting: Ophthalmology

## 2019-06-22 ENCOUNTER — Other Ambulatory Visit: Payer: Self-pay

## 2019-06-22 DIAGNOSIS — H35033 Hypertensive retinopathy, bilateral: Secondary | ICD-10-CM

## 2019-06-22 DIAGNOSIS — H353211 Exudative age-related macular degeneration, right eye, with active choroidal neovascularization: Secondary | ICD-10-CM | POA: Diagnosis not present

## 2019-06-22 DIAGNOSIS — H25813 Combined forms of age-related cataract, bilateral: Secondary | ICD-10-CM | POA: Diagnosis not present

## 2019-06-22 DIAGNOSIS — I10 Essential (primary) hypertension: Secondary | ICD-10-CM

## 2019-06-22 DIAGNOSIS — H3581 Retinal edema: Secondary | ICD-10-CM

## 2019-06-22 DIAGNOSIS — H353122 Nonexudative age-related macular degeneration, left eye, intermediate dry stage: Secondary | ICD-10-CM | POA: Diagnosis not present

## 2019-06-22 MED ORDER — BEVACIZUMAB CHEMO INJECTION 1.25MG/0.05ML SYRINGE FOR KALEIDOSCOPE
1.2500 mg | INTRAVITREAL | Status: AC | PRN
  Administered 2019-06-22: 1.25 mg via INTRAVITREAL

## 2019-06-29 DIAGNOSIS — H2513 Age-related nuclear cataract, bilateral: Secondary | ICD-10-CM | POA: Diagnosis not present

## 2019-06-29 DIAGNOSIS — H353121 Nonexudative age-related macular degeneration, left eye, early dry stage: Secondary | ICD-10-CM | POA: Diagnosis not present

## 2019-06-29 DIAGNOSIS — H25013 Cortical age-related cataract, bilateral: Secondary | ICD-10-CM | POA: Diagnosis not present

## 2019-06-29 DIAGNOSIS — H40013 Open angle with borderline findings, low risk, bilateral: Secondary | ICD-10-CM | POA: Diagnosis not present

## 2019-06-29 LAB — HM DIABETES EYE EXAM

## 2019-07-21 ENCOUNTER — Other Ambulatory Visit: Payer: Self-pay | Admitting: Family Medicine

## 2019-07-21 NOTE — Telephone Encounter (Signed)
Patient requesting a refill on Oxycodone     LOV:  09/08/18  LRF:      05/20/19

## 2019-07-22 MED ORDER — OXYCODONE-ACETAMINOPHEN 10-325 MG PO TABS: 1.0000 | ORAL_TABLET | ORAL | 0 refills | Status: DC | PRN

## 2019-07-27 ENCOUNTER — Other Ambulatory Visit: Payer: Self-pay

## 2019-07-27 MED ORDER — ZOLPIDEM TARTRATE 10 MG PO TABS: ORAL_TABLET | ORAL | 1 refills | Status: DC

## 2019-07-27 NOTE — Telephone Encounter (Signed)
Requested Prescriptions   Pending Prescriptions Disp Refills  . zolpidem (AMBIEN) 10 MG tablet 30 tablet 1    Sig: TAKE 1 TABLET BY MOUTH EVERY DAY AT BEDTIME AS NEEDED    Last OV 09/08/2018  Last written 04/22/2019

## 2019-08-08 ENCOUNTER — Other Ambulatory Visit: Payer: Self-pay | Admitting: Family Medicine

## 2019-08-15 ENCOUNTER — Other Ambulatory Visit: Payer: Self-pay | Admitting: Family Medicine

## 2019-08-23 NOTE — Progress Notes (Signed)
[facility] Note  08/31/2019   CHIEF COMPLAINT Patient presents for Retina Follow Up  HISTORY OF PRESENT ILLNESS: Faith Reeves is a 68 y.o. female who presents to the clinic today for:   HPI    Retina Follow Up    Patient presents with  Wet AMD.  In right eye.  This started 10 weeks ago.  Severity is moderate.  I, the attending physician,  performed the HPI with the patient and updated documentation appropriately.          Comments    Patient here for 10 weeks retina follow up for ARMD OD. Patient states vision doing ok. No eye pain.        Last edited by Bernarda Caffey, MD on 08/31/2019  1:24 PM. (History)    pt states she is doing well   Referring physician: Hortencia Pilar, MD [place],  [place] [address]  HISTORICAL INFORMATION:   Selected notes from the MEDICAL RECORD NUMBER Referred by Dr. Quentin Ore for concern of exu ARMD OU    CURRENT MEDICATIONS: No current outpatient medications on file. (Ophthalmic Drugs)   No current facility-administered medications for this visit.  (Ophthalmic Drugs)   Current Outpatient Medications (Other)  Medication Sig  . albuterol (PROVENTIL HFA;VENTOLIN HFA) 108 (90 Base) MCG/ACT inhaler Inhale 2 puffs into the lungs every 4 (four) hours as needed for wheezing or shortness of breath.  Marland Kitchen amoxicillin (AMOXIL) 500 MG capsule Take 2,000 mg by mouth once. Prior to dental work  . celecoxib (CELEBREX) 200 MG capsule TAKE 1 CAPSULE BY MOUTH EVERY DAY  . gabapentin (NEURONTIN) 300 MG capsule TAKE 1 CAPSULE BY MOUTH THREE TIMES A DAY  . ipratropium (ATROVENT) 0.06 % nasal spray PLACE 2 SPRAYS INTO EACH NOSTRIL 4 TIMES A DAY  . mometasone (ELOCON) 0.1 % cream Apply 1 application topically daily as needed.  Marland Kitchen oxyCODONE-acetaminophen (PERCOCET) 10-325 MG tablet Take 1 tablet by mouth every 4 (four) hours as needed for pain (G89.4).  . pantoprazole (PROTONIX) 40 MG tablet TAKE 1 TABLET BY  MOUTH EVERY DAY  . sertraline (ZOLOFT) 50 MG tablet Take 1.5 tablets (75 mg total) by mouth daily.  Marland Kitchen telmisartan-hydrochlorothiazide (MICARDIS HCT) 80-12.5 MG tablet TAKE 1 TABLET BY MOUTH EVERY DAY  . valACYclovir (VALTREX) 1000 MG tablet Take 2 tablets (2,000 mg total) by mouth 2 (two) times daily.  Marland Kitchen zolpidem (AMBIEN) 10 MG tablet TAKE 1 TABLET BY MOUTH EVERY DAY AT BEDTIME AS NEEDED   Current Facility-Administered Medications (Other)  Medication Route  . Bevacizumab (AVASTIN) SOLN 1.25 mg Intravitreal      REVIEW OF SYSTEMS: ROS    Positive for: Eyes   Negative for: Constitutional, Gastrointestinal, Neurological, Skin, Genitourinary, Musculoskeletal, HENT, Endocrine, Cardiovascular, Respiratory, Psychiatric, Allergic/Imm, Heme/Lymph   Last edited by Theodore Demark, COA on 08/31/2019  1:08 PM. (History)       ALLERGIES Allergies  Allergen Reactions  . Cymbalta [Duloxetine Hcl] Swelling    PAST MEDICAL HISTORY Past Medical History:  Diagnosis Date  . Allergy   . Anemia   . Anxiety   . Blood transfusion without reported diagnosis   . Cataract   . GERD (gastroesophageal reflux disease)   . Hypertension   . Macular degeneration    Past Surgical History:  Procedure Laterality Date  . ABDOMINAL HYSTERECTOMY    . ACHILLES TENDON REPAIR Bilateral   . APPENDECTOMY    . BACK SURGERY    .  BILATERAL CARPAL TUNNEL RELEASE    . CHOLECYSTECTOMY    . COLONOSCOPY    . HERNIA REPAIR    . JOINT REPLACEMENT    . MELANOMA EXCISION Right 09/15/2017   Procedure: EXCISION LIPOSARCOMA RIGHT ARM;  Surgeon: Georganna Skeans, MD;  Location: [place];  Service: General;  Laterality: Right;  . SPINE SURGERY      FAMILY HISTORY Family History  Problem Relation Age of Onset  . Hypertension Mother   . Cancer Sister   . Stroke Maternal Grandmother     SOCIAL HISTORY Social History   Tobacco Use  . Smoking status: Never Smoker  . Smokeless tobacco: Never Used  Substance Use  Topics  . Alcohol use: Yes    Alcohol/week: 1.0 standard drinks    Types: 1 Glasses of wine per week  . Drug use: No         OPHTHALMIC EXAM:  Base Eye Exam    Visual Acuity (Snellen - Linear)      Right Left   Dist cc 20/40 -1 20/20   Dist ph cc 20/25 -2    Correction: Glasses       Tonometry (Tonopen, 1:05 PM)      Right Left   Pressure 14 13       Pupils      Dark Light Shape React APD   Right 4 3 Round Brisk None   Left 4 3 Round Brisk None       Visual Fields (Counting fingers)      Left Right    Full Full       Extraocular Movement      Right Left    Full, Ortho Full, Ortho       Neuro/Psych    Oriented x3: Yes   Mood/Affect: Normal       Dilation    Both eyes: 1.0% Mydriacyl, 2.5% Phenylephrine @ 1:05 PM        Slit Lamp and Fundus Exam    Slit Lamp Exam      Right Left   Lids/Lashes Dermatochalasis - upper lid, mild Meibomian gland dysfunction Dermatochalasis - upper lid, mild Meibomian gland dysfunction   Conjunctiva/Sclera White and quiet White and quiet   Cornea 2+ diffuse Punctate epithelial erosions 2+ diffuse Punctate epithelial erosions   Anterior Chamber deep and clear deep and clear   Iris Round and dilated Round and dilated   Lens 2-3+ Nuclear sclerosis, 2-3+ Cortical cataract 2+ Nuclear sclerosis, 2+ Cortical cataract   Vitreous Vitreous syneresis, Posterior vitreous detachment Vitreous syneresis, Posterior vitreous detachment       Fundus Exam      Right Left   Disc Pink and Sharp Pink and Sharp, temporal PPP   C/D Ratio 0.2 0.2   Macula Blunted foveal reflex, drusen, RPE mottling, clumping and atrophy, +CNV, shallow SRF resolved Flat, Blunted foveal reflex, scattered Drusen, focal PEDs   Vessels Vascular attenuation Vascular attenuation   Periphery Attached, no heme Attached, no heme        Refraction    Wearing Rx      Sphere Cylinder Axis Add   Right +2.00 +0.50 008 +2.50   Left +2.50 +1.00 154 +2.50   Type: PAL           IMAGING AND PROCEDURES  Imaging and Procedures for @TODAY @  OCT, Retina - OU - Both Eyes       Right Eye Quality was good. Central Foveal Thickness: 227. Progression has been stable.  Findings include no IRF, retinal drusen , pigment epithelial detachment, outer retinal atrophy, normal foveal contour, no SRF (Stable resolution of SRF; persistent ORA/drusen).   Left Eye Quality was good. Central Foveal Thickness: 268. Progression has been stable. Findings include normal foveal contour, no SRF, no IRF, retinal drusen .   Notes *Images captured and stored on drive  Diagnosis / Impression:  OD: exu ARMD -- Stable resolution of SRF; persistent ORA/drusen OS: non-exu ARMD -- stable   Clinical management:  See below  Abbreviations: NFP - Normal foveal profile. CME - cystoid macular edema. PED - pigment epithelial detachment. IRF - intraretinal fluid. SRF - subretinal fluid. EZ - ellipsoid zone. ERM - epiretinal membrane. ORA - outer retinal atrophy. ORT - outer retinal tubulation. SRHM - subretinal hyper-reflective material        Intravitreal Injection, Pharmacologic Agent - OD - Right Eye       Time Out 08/31/2019. 1:07 PM. Confirmed correct patient, procedure, site, and patient consented.   Anesthesia Topical anesthesia was used. Anesthetic medications included Lidocaine 2%, Proparacaine 0.5%.   Procedure Preparation included 5% betadine to ocular surface, eyelid speculum. A supplied needle was used.   Injection:  1.25 mg Bevacizumab (AVASTIN) SOLN   NDC: [id number], Lot: [id number] , Expiration date: 10/20/2019   Route: Intravitreal, Site: Right Eye, Waste: 0 mL  Post-op Post injection exam found visual acuity of at least counting fingers. The patient tolerated the procedure well. There were no complications. The patient received written and verbal post procedure care education.                 ASSESSMENT/PLAN:    ICD-10-CM   1. Exudative  age-related macular degeneration of right eye with active choroidal neovascularization (HCC)  H35.3211 Intravitreal Injection, Pharmacologic Agent - OD - Right Eye    Bevacizumab (AVASTIN) SOLN 1.25 mg  2. Retinal edema  H35.81 OCT, Retina - OU - Both Eyes  3. Intermediate stage nonexudative age-related macular degeneration of left eye  H35.3122   4. Essential hypertension  I10   5. Hypertensive retinopathy of both eyes  H35.033   6. Combined forms of age-related cataract of both eyes  H25.813     1,2. Exudative age related macular degeneration, right eye  - FA (03.10.20) shows +CNV w/ staining/leakage  - s/p IVA OD #1 (03.10.20), #2 (04.13.20), #3 (05.11.20), #4 (06.23.20), #5 (8.18.20)  - OCT today shows stable resolution of SRF at 10 wks  - BCVA improved to 20/25-2  - recommend IVA OD #6 today, 10.27.20 with extension to 12 weeks  - pt wishes to be treated with IVA  - RBA of procedure discussed, questions answered  - informed consent obtained and signed  - see procedure note  - f/u in 12 wks -- DFE/OCT/possible injection  3. Age related macular degeneration, non-exudative, left eye  - The incidence, anatomy, and pathology of dry AMD, risk of progression, and the AREDS and AREDS 2 study including smoking risks discussed with patient.  - intermediate stage  - recommend amsler grid monitoring  4,5. Hypertensive retinopathy OU  - discussed importance of tight BP control  - monitor  6. Mixed form age related cataracts OU  - The symptoms of cataract, surgical options, and treatments and risks were discussed with patient.  - discussed diagnosis and progression  - not yet visually significant  - monitor for now  7. Glaucoma Suspect OU  - IOP 14, 13 today  - under the expert management of Dr. 11.09.20  Ophthalmic Meds Ordered this visit:  Meds ordered this encounter  Medications  . Bevacizumab (AVASTIN) SOLN 1.25 mg   Return in about 12 weeks (around 11/23/2019) for f/u exu ARMD  OD, DFE, OCT.  There are no Patient Instructions on file for this visit.  Explained the diagnoses, plan, and follow up with the patient and they expressed understanding.  Patient expressed understanding of the importance of proper follow up care.   Electronically signed by: Estill Bakes, COT 08/23/19 11:06 a.m.  Gardiner Sleeper, M.D., Ph.D. Diseases & Surgery of the Retina and [place] 08/31/19  I have reviewed the above documentation for accuracy and completeness, and I agree with the above. Gardiner Sleeper, M.D., Ph.D. 08/31/19 2:05 PM      Abbreviations: M myopia (nearsighted); A astigmatism; H hyperopia (farsighted); P presbyopia; Mrx spectacle prescription;  CTL contact lenses; OD right eye; OS left eye; OU both eyes  XT exotropia; ET esotropia; PEK punctate epithelial keratitis; PEE punctate epithelial erosions; DES dry eye syndrome; MGD meibomian gland dysfunction; ATs artificial tears; PFAT's preservative free artificial tears; [place] nuclear sclerotic cataract; PSC posterior subcapsular cataract; ERM epi-retinal membrane; PVD posterior vitreous detachment; RD retinal detachment; DM diabetes mellitus; DR diabetic retinopathy; NPDR non-proliferative diabetic retinopathy; PDR proliferative diabetic retinopathy; CSME clinically significant macular edema; DME diabetic macular edema; dbh dot blot hemorrhages; CWS cotton wool spot; POAG primary open angle glaucoma; C/D cup-to-disc ratio; HVF humphrey visual field; GVF goldmann visual field; OCT optical coherence tomography; IOP intraocular pressure; BRVO Branch retinal vein occlusion; CRVO central retinal vein occlusion; CRAO central retinal artery occlusion; BRAO branch retinal artery occlusion; RT retinal tear; SB scleral buckle; PPV pars plana vitrectomy; VH Vitreous hemorrhage; PRP panretinal laser photocoagulation; IVK intravitreal kenalog; VMT vitreomacular traction; MH Macular hole;  NVD  neovascularization of the disc; NVE neovascularization elsewhere; AREDS age related eye disease study; ARMD age related macular degeneration; POAG primary open angle glaucoma; EBMD epithelial/anterior basement membrane dystrophy; ACIOL anterior chamber intraocular lens; IOL intraocular lens; PCIOL posterior chamber intraocular lens; Phaco/IOL phacoemulsification with intraocular lens placement; [place] photorefractive keratectomy; LASIK laser assisted in situ keratomileusis; HTN hypertension; DM diabetes mellitus; COPD chronic obstructive pulmonary disease

## 2019-08-31 ENCOUNTER — Encounter (INDEPENDENT_AMBULATORY_CARE_PROVIDER_SITE_OTHER): Payer: Self-pay | Admitting: Ophthalmology

## 2019-08-31 ENCOUNTER — Ambulatory Visit (INDEPENDENT_AMBULATORY_CARE_PROVIDER_SITE_OTHER): Payer: Medicare Other | Admitting: Ophthalmology

## 2019-08-31 ENCOUNTER — Other Ambulatory Visit: Payer: Self-pay | Admitting: Family Medicine

## 2019-08-31 ENCOUNTER — Other Ambulatory Visit: Payer: Self-pay

## 2019-08-31 DIAGNOSIS — H353211 Exudative age-related macular degeneration, right eye, with active choroidal neovascularization: Secondary | ICD-10-CM

## 2019-08-31 DIAGNOSIS — H3581 Retinal edema: Secondary | ICD-10-CM | POA: Diagnosis not present

## 2019-08-31 DIAGNOSIS — H353122 Nonexudative age-related macular degeneration, left eye, intermediate dry stage: Secondary | ICD-10-CM

## 2019-08-31 DIAGNOSIS — H25813 Combined forms of age-related cataract, bilateral: Secondary | ICD-10-CM | POA: Diagnosis not present

## 2019-08-31 DIAGNOSIS — I10 Essential (primary) hypertension: Secondary | ICD-10-CM | POA: Diagnosis not present

## 2019-08-31 DIAGNOSIS — H35033 Hypertensive retinopathy, bilateral: Secondary | ICD-10-CM | POA: Diagnosis not present

## 2019-08-31 MED ORDER — BEVACIZUMAB CHEMO INJECTION 1.25MG/0.05ML SYRINGE FOR KALEIDOSCOPE
1.2500 mg | INTRAVITREAL | Status: AC | PRN
  Administered 2019-08-31: 14:00:00 1.25 mg via INTRAVITREAL

## 2019-09-24 ENCOUNTER — Other Ambulatory Visit: Payer: Self-pay

## 2019-09-25 ENCOUNTER — Other Ambulatory Visit: Payer: Self-pay | Admitting: Family Medicine

## 2019-09-27 NOTE — Telephone Encounter (Signed)
Ok to refill??  Last office visit 09/08/2018. CPE scheduled for 10/18/2019.  Last refill 07/27/2019, #1 refill.

## 2019-10-18 ENCOUNTER — Encounter: Payer: Self-pay | Admitting: Family Medicine

## 2019-10-18 ENCOUNTER — Ambulatory Visit (INDEPENDENT_AMBULATORY_CARE_PROVIDER_SITE_OTHER): Payer: Medicare Other | Admitting: Family Medicine

## 2019-10-18 ENCOUNTER — Other Ambulatory Visit: Payer: Self-pay

## 2019-10-18 VITALS — BP 122/74 | HR 84 | Temp 97.6°F | Resp 18 | Ht 59.0 in | Wt 232.0 lb

## 2019-10-18 DIAGNOSIS — Z23 Encounter for immunization: Secondary | ICD-10-CM

## 2019-10-18 DIAGNOSIS — Z0001 Encounter for general adult medical examination with abnormal findings: Secondary | ICD-10-CM

## 2019-10-18 DIAGNOSIS — L299 Pruritus, unspecified: Secondary | ICD-10-CM

## 2019-10-18 DIAGNOSIS — Z0000 Encounter for general adult medical examination without abnormal findings: Secondary | ICD-10-CM

## 2019-10-18 DIAGNOSIS — I10 Essential (primary) hypertension: Secondary | ICD-10-CM

## 2019-10-18 DIAGNOSIS — Z1231 Encounter for screening mammogram for malignant neoplasm of breast: Secondary | ICD-10-CM | POA: Diagnosis not present

## 2019-10-18 DIAGNOSIS — M79641 Pain in right hand: Secondary | ICD-10-CM

## 2019-10-18 MED ORDER — MOMETASONE FUROATE 0.1 % EX CREA: 1.0000 "application " | TOPICAL_CREAM | Freq: Every day | CUTANEOUS | 3 refills | Status: DC | PRN

## 2019-10-18 MED ORDER — ZOLPIDEM TARTRATE 10 MG PO TABS: 10.0000 mg | ORAL_TABLET | Freq: Every evening | ORAL | 5 refills | Status: DC | PRN

## 2019-10-18 NOTE — Addendum Note (Signed)
Addended by: Shary Decamp B on: 10/18/2019 11:11 AM   Modules accepted: Orders

## 2019-10-18 NOTE — Addendum Note (Signed)
Addended by: Jenna Luo T on: 10/18/2019 10:23 AM   Modules accepted: Orders

## 2019-10-18 NOTE — Progress Notes (Signed)
Subjective:    Patient ID: Faith Reeves, female    DOB: 1951-10-03, 68 y.o.   MRN: [id number]  Medication Refill   Here today for CPE.  Patient has a history of 3 separate back surgeries. She has severe scoliosis. She's had what sounds like Harrington rod placement in [place] as well as a laminectomy. She has chronic low back pain for which she takes gabapentin 300 mg 2-3 times a day for neuropathic pain in her legs. She also takes Percocet 10/325 however she does not take this regularly and usually only takes 1 pill a day to help her sleep at night. She has tried to stop the medication the past however the pain in her back is so severe that she needs to take something at night to help her sleep. She has a visibly deformed spine with severe scoliosis particularly pronounced to the right side.  She does not require a Pap smear because she had a hysterectomy for fibroids. Colonoscopy was performed in 2012 and is up-to-date and she refuses another.  Last mammogram was January 2020 and was normal.  She is due for another one this January in 2021.  She had a bone density test with a T score of -1 in January 2020.  Patient states that she is not taking any calcium or vitamin D.  Otherwise she is doing well with no concerns.  Past Medical History:  Diagnosis Date  . Allergy   . Anemia   . Anxiety   . Blood transfusion without reported diagnosis   . Cataract   . GERD (gastroesophageal reflux disease)   . Hypertension   . Macular degeneration    Past Surgical History:  Procedure Laterality Date  . ABDOMINAL HYSTERECTOMY    . ACHILLES TENDON REPAIR Bilateral   . APPENDECTOMY    . BACK SURGERY    . BILATERAL CARPAL TUNNEL RELEASE    . CHOLECYSTECTOMY    . COLONOSCOPY    . HERNIA REPAIR    . JOINT REPLACEMENT    . MELANOMA EXCISION Right 09/15/2017   Procedure: EXCISION LIPOSARCOMA RIGHT ARM;  Surgeon: Georganna Skeans, MD;  Location: [facility];  Service: General;  Laterality: Right;  . SPINE  SURGERY     Current Outpatient Medications on File Prior to Visit  Medication Sig Dispense Refill  . albuterol (PROVENTIL HFA;VENTOLIN HFA) 108 (90 Base) MCG/ACT inhaler Inhale 2 puffs into the lungs every 4 (four) hours as needed for wheezing or shortness of breath. 1 Inhaler 0  . amoxicillin (AMOXIL) 500 MG capsule Take 2,000 mg by mouth once. Prior to dental work    . celecoxib (CELEBREX) 200 MG capsule TAKE 1 CAPSULE BY MOUTH EVERY DAY 90 capsule 3  . gabapentin (NEURONTIN) 300 MG capsule TAKE 1 CAPSULE BY MOUTH THREE TIMES A DAY 90 capsule 1  . ipratropium (ATROVENT) 0.06 % nasal spray PLACE 2 SPRAYS INTO EACH NOSTRIL 4 TIMES A DAY 15 mL 5  . mometasone (ELOCON) 0.1 % cream Apply 1 application topically daily as needed. 45 g 1  . oxyCODONE-acetaminophen (PERCOCET) 10-325 MG tablet Take 1 tablet by mouth every 4 (four) hours as needed for pain (G89.4). 90 tablet 0  . pantoprazole (PROTONIX) 40 MG tablet TAKE 1 TABLET BY MOUTH EVERY DAY 90 tablet 3  . sertraline (ZOLOFT) 50 MG tablet Take 1.5 tablets (75 mg total) by mouth daily. 135 tablet 3  . telmisartan-hydrochlorothiazide (MICARDIS HCT) 80-12.5 MG tablet TAKE 1 TABLET BY MOUTH EVERY DAY 90  tablet 3  . valACYclovir (VALTREX) 1000 MG tablet Take 2 tablets (2,000 mg total) by mouth 2 (two) times daily. 4 tablet 2  . zolpidem (AMBIEN) 10 MG tablet TAKE 1 TABLET BY MOUTH EVERY DAY AT BEDTIME AS NEEDED 30 tablet 0   Current Facility-Administered Medications on File Prior to Visit  Medication Dose Route Frequency Provider Last Rate Last Admin  . Bevacizumab (AVASTIN) SOLN 1.25 mg  1.25 mg Intravitreal  Bernarda Caffey, MD   1.25 mg at 01/12/19 1629   Allergies  Allergen Reactions  . Cymbalta [Duloxetine Hcl] Swelling   Social History   Socioeconomic History  . Marital status: Married    Spouse name: Not on file  . Number of children: Not on file  . Years of education: Not on file  . Highest education level: Not on file  Occupational  History  . Not on file  Tobacco Use  . Smoking status: Never Smoker  . Smokeless tobacco: Never Used  Substance and Sexual Activity  . Alcohol use: Yes    Alcohol/week: 1.0 standard drinks    Types: 1 Glasses of wine per week  . Drug use: No  . Sexual activity: Never  Other Topics Concern  . Not on file  Social History Narrative  . Not on file   Social Determinants of Health   Financial Resource Strain:   . Difficulty of Paying Living Expenses: Not on file  Food Insecurity:   . Worried About Charity fundraiser in the Last Year: Not on file  . Ran Out of Food in the Last Year: Not on file  Transportation Needs:   . Lack of Transportation (Medical): Not on file  . Lack of Transportation (Non-Medical): Not on file  Physical Activity:   . Days of Exercise per Week: Not on file  . Minutes of Exercise per Session: Not on file  Stress:   . Feeling of Stress : Not on file  Social Connections:   . Frequency of Communication with Friends and Family: Not on file  . Frequency of Social Gatherings with Friends and Family: Not on file  . Attends Religious Services: Not on file  . Active Member of Clubs or Organizations: Not on file  . Attends Archivist Meetings: Not on file  . Marital Status: Not on file  Intimate Partner Violence:   . Fear of Current or Ex-Partner: Not on file  . Emotionally Abused: Not on file  . Physically Abused: Not on file  . Sexually Abused: Not on file   Family History  Problem Relation Age of Onset  . Hypertension Mother   . Cancer Sister   . Stroke Maternal Grandmother      Review of Systems  All other systems reviewed and are negative.      Objective:   Physical Exam  Constitutional: She is oriented to person, place, and time. She appears well-developed and well-nourished. No distress.  HENT:  Head: Normocephalic and atraumatic.  Right Ear: External ear normal.  Left Ear: External ear normal.  Nose: Nose normal.  Mouth/Throat:  Oropharynx is clear and moist. No oropharyngeal exudate.  Eyes: Pupils are equal, round, and reactive to light. Conjunctivae and EOM are normal. Right eye exhibits no discharge. Left eye exhibits no discharge. No scleral icterus.  Neck: No JVD present. No tracheal deviation present. No thyromegaly present.  Cardiovascular: Normal rate, regular rhythm, normal heart sounds and intact distal pulses. Exam reveals no gallop and no friction rub.  No murmur heard. Pulmonary/Chest: Effort normal and breath sounds normal. No stridor. No respiratory distress. She has no wheezes. She has no rales. She exhibits no tenderness.  Abdominal: Soft. Bowel sounds are normal. She exhibits no distension and no mass. There is no abdominal tenderness. There is no rebound and no guarding.  Musculoskeletal:        General: No tenderness or edema. Normal range of motion.     Cervical back: Normal range of motion and neck supple.  Lymphadenopathy:    She has no cervical adenopathy.  Neurological: She is alert and oriented to person, place, and time. She has normal reflexes. No cranial nerve deficit. She exhibits normal muscle tone. Coordination normal.  Skin: Skin is warm. No rash noted. She is not diaphoretic. No erythema. No pallor.  Psychiatric: She has a normal mood and affect. Her behavior is normal. Judgment and thought content normal.          Assessment & Plan:  General medical exam - Plan: CBC with Differential, COMPLETE METABOLIC PANEL WITH GFR, Lipid Panel  Benign essential HTN - Plan: CBC with Differential, COMPLETE METABOLIC PANEL WITH GFR, Lipid Panel  Encounter for screening mammogram for malignant neoplasm of breast  I will schedule the patient for a mammogram.  I recommended calcium and vitamin D for her osteopenia.  Her blood pressure today is excellent.  I will check a CBC, CMP, and fasting lipid panel to monitor the management of her cholesterol.  Bone density is up-to-date.  Colonoscopy is  up-to-date.  Pap smear is not required.  Patient denies any depression that is poorly controlled, memory loss, or falls.

## 2019-10-19 LAB — LIPID PANEL
Cholesterol: 139 mg/dL (ref <200)
HDL: 58 mg/dL (ref >=50)
LDL Cholesterol (Calc): 68 mg/dL (calc)
Non-HDL Cholesterol (Calc): 81 mg/dL (calc) (ref <130)
Total CHOL/HDL Ratio: 2.4 (calc) (ref <5.0)
Triglycerides: 52 mg/dL (ref <150)

## 2019-10-19 LAB — COMPLETE METABOLIC PANEL WITH GFR
AG Ratio: 1 (calc) (ref 1.0–2.5)
ALT: 14 U/L (ref 6–29)
AST: 17 U/L (ref 10–35)
Albumin: 3.5 g/dL — ABNORMAL LOW (ref 3.6–5.1)
Alkaline phosphatase (APISO): 108 U/L (ref 37–153)
BUN/Creatinine Ratio: 16 (calc) (ref 6–22)
BUN: 20 mg/dL (ref 7–25)
CO2: 28 mmol/L (ref 20–32)
Calcium: 9.2 mg/dL (ref 8.6–10.4)
Chloride: 102 mmol/L (ref 98–110)
Creat: 1.29 mg/dL — ABNORMAL HIGH (ref 0.60–0.93)
GFR, Est African American: 49 mL/min/{1.73_m2} — ABNORMAL LOW (ref >=60)
GFR, Est Non African American: 42 mL/min/{1.73_m2} — ABNORMAL LOW (ref >=60)
Globulin: 3.4 g/dL (calc) (ref 1.9–3.7)
Glucose, Bld: 81 mg/dL (ref 65–99)
Potassium: 4.1 mmol/L (ref 3.5–5.3)
Sodium: 140 mmol/L (ref 135–146)
Total Bilirubin: 0.7 mg/dL (ref 0.2–1.2)
Total Protein: 6.9 g/dL (ref 6.1–8.1)

## 2019-10-19 LAB — CBC WITH DIFFERENTIAL/PLATELET
Absolute Monocytes: 383 cells/uL (ref 200–950)
Basophils Absolute: 41 cells/uL (ref 0–200)
Basophils Relative: 0.7 %
Eosinophils Absolute: 249 cells/uL (ref 15–500)
Eosinophils Relative: 4.3 %
HCT: 43.4 % (ref 35.0–45.0)
Hemoglobin: 14.1 g/dL (ref 11.7–15.5)
Lymphs Abs: 980 cells/uL (ref 850–3900)
MCH: 28.3 pg (ref 27.0–33.0)
MCHC: 32.5 g/dL (ref 32.0–36.0)
MCV: 87 fL (ref 80.0–100.0)
MPV: 11.5 fL (ref 7.5–12.5)
Monocytes Relative: 6.6 %
Neutro Abs: 4147 cells/uL (ref 1500–7800)
Neutrophils Relative %: 71.5 %
Platelets: 187 10*3/uL (ref 140–400)
RBC: 4.99 10*6/uL (ref 3.80–5.10)
RDW: 13.4 % (ref 11.0–15.0)
Total Lymphocyte: 16.9 %
WBC: 5.8 10*3/uL (ref 3.8–10.8)

## 2019-10-20 ENCOUNTER — Other Ambulatory Visit: Payer: Self-pay | Admitting: Family Medicine

## 2019-10-20 DIAGNOSIS — N289 Disorder of kidney and ureter, unspecified: Secondary | ICD-10-CM

## 2019-10-21 ENCOUNTER — Other Ambulatory Visit: Payer: Self-pay | Admitting: Family Medicine

## 2019-10-31 ENCOUNTER — Other Ambulatory Visit: Payer: Self-pay | Admitting: Family Medicine

## 2019-11-03 ENCOUNTER — Other Ambulatory Visit: Payer: Medicare Other

## 2019-11-03 DIAGNOSIS — N289 Disorder of kidney and ureter, unspecified: Secondary | ICD-10-CM

## 2019-11-04 LAB — BASIC METABOLIC PANEL
BUN/Creatinine Ratio: 16 (calc) (ref 6–22)
BUN: 18 mg/dL (ref 7–25)
CO2: 31 mmol/L (ref 20–32)
Calcium: 9.4 mg/dL (ref 8.6–10.4)
Chloride: 101 mmol/L (ref 98–110)
Creat: 1.11 mg/dL — ABNORMAL HIGH (ref 0.60–0.93)
Glucose, Bld: 83 mg/dL (ref 65–99)
Potassium: 4.3 mmol/L (ref 3.5–5.3)
Sodium: 139 mmol/L (ref 135–146)

## 2019-11-12 ENCOUNTER — Encounter: Payer: Self-pay | Admitting: Family Medicine

## 2019-11-12 ENCOUNTER — Other Ambulatory Visit: Payer: Self-pay

## 2019-11-12 ENCOUNTER — Ambulatory Visit (INDEPENDENT_AMBULATORY_CARE_PROVIDER_SITE_OTHER): Payer: Medicare Other | Admitting: Family Medicine

## 2019-11-12 VITALS — BP 132/80 | HR 88 | Temp 97.4°F | Resp 16 | Ht 59.0 in | Wt 232.0 lb

## 2019-11-12 DIAGNOSIS — M10031 Idiopathic gout, right wrist: Secondary | ICD-10-CM

## 2019-11-12 MED ORDER — OXYCODONE-ACETAMINOPHEN 10-325 MG PO TABS: 1.0000 | ORAL_TABLET | ORAL | 0 refills | Status: DC | PRN

## 2019-11-12 MED ORDER — PREDNISONE 20 MG PO TABS: ORAL_TABLET | ORAL | 0 refills | Status: DC

## 2019-11-12 MED ORDER — CEPHALEXIN 500 MG PO CAPS: 500.0000 mg | ORAL_CAPSULE | Freq: Three times a day (TID) | ORAL | 0 refills | Status: DC

## 2019-11-12 NOTE — Progress Notes (Signed)
Subjective:    Patient ID: Faith Reeves, female    DOB: [birth year], 69 y.o.   MRN: [id number]  HPI  Patient is a very pleasant 69 year old female here today complaining of the sudden onset of pain in her right wrist.  Pain began without provocation 5 days ago.  The right wrist is now swollen, warm to the touch, and slightly erythematous.  There is no streaking erythema to suggest cellulitis.  The patient denies any penetrating trauma to suggest septic arthritis.  She denies any fevers or chills.  She has no history of any gout although she does have chronic kidney disease and is taking hydrochlorothiazide.  She also admits recently with the holidays that she has been eating more shrimp and also partaking in more wine.  She has significant pain with range of motion in the wrist both flexion and extension as well as ulnar and lateral deviation.  There is mild edema.  There is no bruising around the wrist.  She denies any falls or trauma that would cause a fracture. Past Medical History:  Diagnosis Date  . Allergy   . Anemia   . Anxiety   . Blood transfusion without reported diagnosis   . Cataract   . GERD (gastroesophageal reflux disease)   . Hypertension   . Macular degeneration    Past Surgical History:  Procedure Laterality Date  . ABDOMINAL HYSTERECTOMY    . ACHILLES TENDON REPAIR Bilateral   . APPENDECTOMY    . BACK SURGERY    . BILATERAL CARPAL TUNNEL RELEASE    . CHOLECYSTECTOMY    . COLONOSCOPY    . HERNIA REPAIR    . JOINT REPLACEMENT    . MELANOMA EXCISION Right 09/15/2017   Procedure: EXCISION LIPOSARCOMA RIGHT ARM;  Surgeon: Georganna Skeans, MD;  Location: [facility];  Service: General;  Laterality: Right;  . SPINE SURGERY     Current Outpatient Medications on File Prior to Visit  Medication Sig Dispense Refill  . albuterol (PROVENTIL HFA;VENTOLIN HFA) 108 (90 Base) MCG/ACT inhaler Inhale 2 puffs into the lungs every 4 (four) hours as needed for wheezing or shortness of  breath. 1 Inhaler 0  . amoxicillin (AMOXIL) 500 MG capsule Take 2,000 mg by mouth once. Prior to dental work    . celecoxib (CELEBREX) 200 MG capsule TAKE 1 CAPSULE BY MOUTH EVERY DAY 90 capsule 3  . gabapentin (NEURONTIN) 300 MG capsule TAKE 1 CAPSULE BY MOUTH THREE TIMES A DAY 90 capsule 1  . ipratropium (ATROVENT) 0.06 % nasal spray SPRAY 2 SPRAY INTO EACH NOSTRIL 4 TIMES A DAY 15 mL 1  . mometasone (ELOCON) 0.1 % cream Apply 1 application topically daily as needed. 50 g 3  . pantoprazole (PROTONIX) 40 MG tablet TAKE 1 TABLET BY MOUTH EVERY DAY 90 tablet 3  . sertraline (ZOLOFT) 50 MG tablet Take 1.5 tablets (75 mg total) by mouth daily. 135 tablet 3  . telmisartan-hydrochlorothiazide (MICARDIS HCT) 80-12.5 MG tablet TAKE 1 TABLET BY MOUTH EVERY DAY 90 tablet 3  . valACYclovir (VALTREX) 1000 MG tablet Take 2 tablets (2,000 mg total) by mouth 2 (two) times daily. 4 tablet 2  . zolpidem (AMBIEN) 10 MG tablet Take 1 tablet (10 mg total) by mouth at bedtime as needed for sleep. 30 tablet 5   Current Facility-Administered Medications on File Prior to Visit  Medication Dose Route Frequency Provider Last Rate Last Admin  . Bevacizumab (AVASTIN) SOLN 1.25 mg  1.25 mg Intravitreal  Bernarda Caffey, MD  1.25 mg at 01/12/19 1629   Allergies  Allergen Reactions  . Cymbalta [Duloxetine Hcl] Swelling   Social History   Socioeconomic History  . Marital status: Married    Spouse name: Not on file  . Number of children: Not on file  . Years of education: Not on file  . Highest education level: Not on file  Occupational History  . Not on file  Tobacco Use  . Smoking status: Never Smoker  . Smokeless tobacco: Never Used  Substance and Sexual Activity  . Alcohol use: Yes    Alcohol/week: 1.0 standard drinks    Types: 1 Glasses of wine per week  . Drug use: No  . Sexual activity: Never  Other Topics Concern  . Not on file  Social History Narrative  . Not on file   Social Determinants of  Health   Financial Resource Strain:   . Difficulty of Paying Living Expenses: Not on file  Food Insecurity:   . Worried About Charity fundraiser in the Last Year: Not on file  . Ran Out of Food in the Last Year: Not on file  Transportation Needs:   . Lack of Transportation (Medical): Not on file  . Lack of Transportation (Non-Medical): Not on file  Physical Activity:   . Days of Exercise per Week: Not on file  . Minutes of Exercise per Session: Not on file  Stress:   . Feeling of Stress : Not on file  Social Connections:   . Frequency of Communication with Friends and Family: Not on file  . Frequency of Social Gatherings with Friends and Family: Not on file  . Attends Religious Services: Not on file  . Active Member of Clubs or Organizations: Not on file  . Attends Archivist Meetings: Not on file  . Marital Status: Not on file  Intimate Partner Violence:   . Fear of Current or Ex-Partner: Not on file  . Emotionally Abused: Not on file  . Physically Abused: Not on file  . Sexually Abused: Not on file     Review of Systems  All other systems reviewed and are negative.      Objective:   Physical Exam Constitutional:      Appearance: She is obese.  Cardiovascular:     Rate and Rhythm: Normal rate and regular rhythm.     Heart sounds: Normal heart sounds.  Pulmonary:     Effort: Pulmonary effort is normal.     Breath sounds: Normal breath sounds.  Musculoskeletal:        General: Swelling and tenderness present.     Right wrist: Swelling and tenderness present. Decreased range of motion.  Skin:    Findings: Erythema present.  Neurological:     Mental Status: She is alert.           Assessment & Plan:  Acute idiopathic gout of right wrist - Plan: predniSONE (DELTASONE) 20 MG tablet, Uric Acid  Unfortunately, I believe the patient has a gout exacerbation in her right wrist given the severity of the pain and the history.  Check a uric acid level.   Begin a prednisone taper pack given the 5-day duration.  I do not feel the colchicine would be sufficient at this point.  Recheck on Monday.  If she sees fever or streaking erythema, I want her to start Keflex for possible cellulitis and if the joint worsens, consider septic arthritis however I suspect this is gout.  I believe that  the shrimp in the wine likely precipitated it given her history of chronic kidney disease as well as taking hydrochlorothiazide.  Therefore we may want to discontinue hydrochlorothiazide in the future.  Await the results of her lab work.

## 2019-11-13 LAB — URIC ACID: Uric Acid, Serum: 6 mg/dL (ref 2.5–7.0)

## 2019-11-23 ENCOUNTER — Other Ambulatory Visit: Payer: Self-pay | Admitting: Family Medicine

## 2019-11-23 ENCOUNTER — Encounter (INDEPENDENT_AMBULATORY_CARE_PROVIDER_SITE_OTHER): Payer: Medicare Other | Admitting: Ophthalmology

## 2019-11-29 NOTE — Progress Notes (Addendum)
[facility] Note  11/30/2019   CHIEF COMPLAINT Patient presents for Retina Follow Up  HISTORY OF PRESENT ILLNESS: Faith Reeves is a 69 y.o. female who presents to the clinic today for:   HPI    Retina Follow Up    Patient presents with  Wet AMD.  In right eye.  This started months ago.  Severity is moderate.  Duration of 12 weeks.  Since onset it is gradually improving.  I, the attending physician,  performed the HPI with the patient and updated documentation appropriately.          Comments    69 y/o female pt here for 12 wk f/u for wet ARMD OD.  No change in [place] noticed OU.  Denies pain, flashes, floaters.  No gtts.       Last edited by Bernarda Caffey, MD on 11/30/2019  1:10 PM. (History)    pt states she missed her appt last week due to having trouble walking, she states she was taken off of Celebrex bc it was messing up her kidneys, she states her vision is stable   Referring physician: Susy Frizzle, MD [address] [place],  [place] [address]  HISTORICAL INFORMATION:   Selected notes from the MEDICAL RECORD NUMBER Referred by Dr. Quentin Ore for concern of exu ARMD OU    CURRENT MEDICATIONS: No current outpatient medications on file. (Ophthalmic Drugs)   No current facility-administered medications for this visit. (Ophthalmic Drugs)   Current Outpatient Medications (Other)  Medication Sig  . albuterol (PROVENTIL HFA;VENTOLIN HFA) 108 (90 Base) MCG/ACT inhaler Inhale 2 puffs into the lungs every 4 (four) hours as needed for wheezing or shortness of breath.  Marland Kitchen amoxicillin (AMOXIL) 500 MG capsule Take 2,000 mg by mouth once. Prior to dental work  . celecoxib (CELEBREX) 200 MG capsule TAKE 1 CAPSULE BY MOUTH EVERY DAY  . cephALEXin (KEFLEX) 500 MG capsule Take 1 capsule (500 mg total) by mouth 3 (three) times daily.  Marland Kitchen gabapentin (NEURONTIN) 300 MG capsule TAKE 1 CAPSULE BY MOUTH THREE TIMES A DAY  . ipratropium (ATROVENT) 0.06 %  nasal spray SPRAY 2 SPRAYS INTO EACH NOSTRIL 4 TIMES A DAY  . mometasone (ELOCON) 0.1 % cream Apply 1 application topically daily as needed.  Marland Kitchen oxyCODONE-acetaminophen (PERCOCET) 10-325 MG tablet Take 1 tablet by mouth every 4 (four) hours as needed for pain (G89.4).  . pantoprazole (PROTONIX) 40 MG tablet TAKE 1 TABLET BY MOUTH EVERY DAY  . predniSONE (DELTASONE) 20 MG tablet 3 tabs poqday 1-2, 2 tabs poqday 3-4, 1 tab poqday 5-6  . sertraline (ZOLOFT) 50 MG tablet Take 1.5 tablets (75 mg total) by mouth daily.  Marland Kitchen telmisartan-hydrochlorothiazide (MICARDIS HCT) 80-12.5 MG tablet TAKE 1 TABLET BY MOUTH EVERY DAY  . valACYclovir (VALTREX) 1000 MG tablet Take 2 tablets (2,000 mg total) by mouth 2 (two) times daily.  Marland Kitchen zolpidem (AMBIEN) 10 MG tablet Take 1 tablet (10 mg total) by mouth at bedtime as needed for sleep.   Current Facility-Administered Medications (Other)  Medication Route  . Bevacizumab (AVASTIN) SOLN 1.25 mg Intravitreal      REVIEW OF SYSTEMS: ROS    Positive for: Eyes   Negative for: Constitutional, Gastrointestinal, Neurological, Skin, Genitourinary, Musculoskeletal, HENT, Endocrine, Cardiovascular, Respiratory, Psychiatric, Allergic/Imm, Heme/Lymph   Last edited by Matthew Folks, COA on 11/30/2019 12:49 PM. (History)       ALLERGIES Allergies  Allergen Reactions  . Cymbalta [Duloxetine  Hcl] Swelling    PAST MEDICAL HISTORY Past Medical History:  Diagnosis Date  . Allergy   . Anemia   . Anxiety   . Blood transfusion without reported diagnosis   . Cataract   . GERD (gastroesophageal reflux disease)   . Hypertension   . Hypertensive retinopathy    OU  . Macular degeneration    Past Surgical History:  Procedure Laterality Date  . ABDOMINAL HYSTERECTOMY    . ACHILLES TENDON REPAIR Bilateral   . APPENDECTOMY    . BACK SURGERY    . BILATERAL CARPAL TUNNEL RELEASE    . CHOLECYSTECTOMY    . COLONOSCOPY    . HERNIA REPAIR    . JOINT REPLACEMENT    .  MELANOMA EXCISION Right 09/15/2017   Procedure: EXCISION LIPOSARCOMA RIGHT ARM;  Surgeon: Georganna Skeans, MD;  Location: [place];  Service: General;  Laterality: Right;  . SPINE SURGERY      FAMILY HISTORY Family History  Problem Relation Age of Onset  . Hypertension Mother   . Cancer Sister   . Stroke Maternal Grandmother     SOCIAL HISTORY Social History   Tobacco Use  . Smoking status: Never Smoker  . Smokeless tobacco: Never Used  Substance Use Topics  . Alcohol use: Yes    Alcohol/week: 1.0 standard drinks    Types: 1 Glasses of wine per week  . Drug use: No         OPHTHALMIC EXAM:  Base Eye Exam    Visual Acuity (Snellen - Linear)      Right Left   Dist cc 20/40 -2 20/20 -2   Dist ph cc 20/20 -2    Correction: Glasses       Tonometry (Tonopen, 12:51 PM)      Right Left   Pressure 19 15       Pupils      Dark Light Shape React APD   Right 4 3 Round Brisk None   Left 4 3 Round Brisk None       Visual Fields (Counting fingers)      Left Right    Full Full       Extraocular Movement      Right Left    Full, Ortho Full, Ortho       Neuro/Psych    Oriented x3: Yes   Mood/Affect: Normal       Dilation    Both eyes: 1.0% Mydriacyl, 2.5% Phenylephrine @ 12:51 PM        Slit Lamp and Fundus Exam    Slit Lamp Exam      Right Left   Lids/Lashes Dermatochalasis - upper lid, mild Meibomian gland dysfunction Dermatochalasis - upper lid, mild Meibomian gland dysfunction   Conjunctiva/Sclera White and quiet White and quiet   Cornea 2+ diffuse Punctate epithelial erosions 2+ diffuse Punctate epithelial erosions   Anterior Chamber deep and clear deep and clear   Iris Round and dilated Round and dilated   Lens 2-3+ Nuclear sclerosis, 2-3+ Cortical cataract 2+ Nuclear sclerosis, 2+ Cortical cataract   Vitreous Vitreous syneresis, Posterior vitreous detachment Vitreous syneresis, Posterior vitreous detachment       Fundus Exam      Right Left   Disc  Pink and Sharp Pink and Sharp, temporal PPP   C/D Ratio 0.2 0.2   Macula Blunted foveal reflex, drusen, RPE mottling, clumping and atrophy, +CNV, shallow SRF stably resolved Flat, Blunted foveal reflex, scattered Drusen, focal PEDs  Vessels Vascular attenuation Vascular attenuation   Periphery Attached, no heme Attached, no heme          IMAGING AND PROCEDURES  Imaging and Procedures for @TODAY @  OCT, Retina - OU - Both Eyes       Right Eye Quality was good. Central Foveal Thickness: 234. Progression has been stable. Findings include no IRF, retinal drusen , pigment epithelial detachment, outer retinal atrophy, normal foveal contour, no SRF (Stable resolution of SRF; persistent ORA/drusen).   Left Eye Quality was good. Central Foveal Thickness: 268. Progression has been stable. Findings include normal foveal contour, no SRF, no IRF, retinal drusen .   Notes *Images captured and stored on drive  Diagnosis / Impression:  OD: exu ARMD -- Stable resolution of SRF; persistent ORA/drusen OS: non-exu ARMD -- stable   Clinical management:  See below  Abbreviations: NFP - Normal foveal profile. CME - cystoid macular edema. PED - pigment epithelial detachment. IRF - intraretinal fluid. SRF - subretinal fluid. EZ - ellipsoid zone. ERM - epiretinal membrane. ORA - outer retinal atrophy. ORT - outer retinal tubulation. SRHM - subretinal hyper-reflective material        Intravitreal Injection, Pharmacologic Agent - OD - Right Eye       Time Out 11/30/2019. 1:16 PM. Confirmed correct patient, procedure, site, and patient consented.   Anesthesia Topical anesthesia was used. Anesthetic medications included Lidocaine 2%, Proparacaine 0.5%.   Procedure Preparation included 5% betadine to ocular surface, eyelid speculum. A 30 gauge needle was used.   Injection:  1.25 mg Bevacizumab (AVASTIN) SOLN   NDC: [id number], Lot: [id number] , Expiration date: 02/03/2020   Route:  Intravitreal, Site: Right Eye, Waste: 0 mL  Post-op Post injection exam found visual acuity of at least counting fingers. The patient tolerated the procedure well. There were no complications. The patient received written and verbal post procedure care education.                 ASSESSMENT/PLAN:    ICD-10-CM   1. Exudative age-related macular degeneration of right eye with active choroidal neovascularization (HCC)  H35.3211 Intravitreal Injection, Pharmacologic Agent - OD - Right Eye    Bevacizumab (AVASTIN) SOLN 1.25 mg  2. Retinal edema  H35.81 OCT, Retina - OU - Both Eyes  3. Intermediate stage nonexudative age-related macular degeneration of left eye  H35.3122   4. Essential hypertension  I10   5. Hypertensive retinopathy of both eyes  H35.033   6. Combined forms of age-related cataract of both eyes  H25.813     1,2. Exudative age related macular degeneration, right eye  - FA (03.10.20) shows +CNV w/ staining/leakage  - s/p IVA OD #1 (03.10.20), #2 (04.13.20), #3 (05.11.20), #4 (06.23.20), #5 (8.18.20), #6 (10.27.20)  - OCT today shows stable resolution of SRF at 12 wks  - BCVA improved to 20/20  - recommend IVA OD #7 today, 01.26.21 -- maintenance  - pt wishes to be treated with IVA  - RBA of procedure discussed, questions answered  - informed consent obtained and signed  - see procedure note  - f/u in 12 wks -- DFE/OCT/possible injection  3. Age related macular degeneration, non-exudative, left eye  - The incidence, anatomy, and pathology of dry AMD, risk of progression, and the AREDS and AREDS 2 study including smoking risks discussed with patient.  - intermediate stage  - recommend amsler grid monitoring  4,5. Hypertensive retinopathy OU  - discussed importance of tight BP control  - monitor  6.  Mixed form age related cataracts OU  - The symptoms of cataract, surgical options, and treatments and risks were discussed with patient.  - discussed diagnosis and  progression  - not yet visually significant  - monitor for now  7. Glaucoma Suspect OU  - IOP 19, 15 today  - under the expert management of Dr. Kathlen Mody   Ophthalmic Meds Ordered this visit:  Meds ordered this encounter  Medications  . Bevacizumab (AVASTIN) SOLN 1.25 mg   Return in about 12 weeks (around 02/22/2020) for f/u exu ARMD OD, DFE, OCT.  There are no Patient Instructions on file for this visit.  Explained the diagnoses, plan, and follow up with the patient and they expressed understanding.  Patient expressed understanding of the importance of proper follow up care.   This document serves as a record of services personally performed by Gardiner Sleeper, MD, PhD. It was created on their behalf by Estill Bakes, COT an ophthalmic technician. The creation of this record is the provider's dictation and/or activities during the visit.    Electronically signed by: Estill Bakes, COT 11/29/19 @ 5:25 PM   This document serves as a record of services personally performed by Gardiner Sleeper, MD, PhD. It was created on their behalf by Ernest Mallick, OA, an ophthalmic assistant. The creation of this record is the provider's dictation and/or activities during the visit.    Electronically signed by: Ernest Mallick, OA 01.26.2021 5:25 PM  Gardiner Sleeper, M.D., Ph.D. Diseases & Surgery of the Retina and [place] 11/30/2019   I have reviewed the above documentation for accuracy and completeness, and I agree with the above. Gardiner Sleeper, M.D., Ph.D. 12/01/19 5:25 PM   Abbreviations: M myopia (nearsighted); A astigmatism; H hyperopia (farsighted); P presbyopia; Mrx spectacle prescription;  CTL contact lenses; OD right eye; OS left eye; OU both eyes  XT exotropia; ET esotropia; PEK punctate epithelial keratitis; PEE punctate epithelial erosions; DES dry eye syndrome; MGD meibomian gland dysfunction; ATs artificial tears; PFAT's preservative free artificial  tears; [place] nuclear sclerotic cataract; PSC posterior subcapsular cataract; ERM epi-retinal membrane; PVD posterior vitreous detachment; RD retinal detachment; DM diabetes mellitus; DR diabetic retinopathy; NPDR non-proliferative diabetic retinopathy; PDR proliferative diabetic retinopathy; CSME clinically significant macular edema; DME diabetic macular edema; dbh dot blot hemorrhages; CWS cotton wool spot; POAG primary open angle glaucoma; C/D cup-to-disc ratio; HVF humphrey visual field; GVF goldmann visual field; OCT optical coherence tomography; IOP intraocular pressure; BRVO Branch retinal vein occlusion; CRVO central retinal vein occlusion; CRAO central retinal artery occlusion; BRAO branch retinal artery occlusion; RT retinal tear; SB scleral buckle; PPV pars plana vitrectomy; VH Vitreous hemorrhage; PRP panretinal laser photocoagulation; IVK intravitreal kenalog; VMT vitreomacular traction; MH Macular hole;  NVD neovascularization of the disc; NVE neovascularization elsewhere; AREDS age related eye disease study; ARMD age related macular degeneration; POAG primary open angle glaucoma; EBMD epithelial/anterior basement membrane dystrophy; ACIOL anterior chamber intraocular lens; IOL intraocular lens; PCIOL posterior chamber intraocular lens; Phaco/IOL phacoemulsification with intraocular lens placement; [place] photorefractive keratectomy; LASIK laser assisted in situ keratomileusis; HTN hypertension; DM diabetes mellitus; COPD chronic obstructive pulmonary disease

## 2019-11-30 ENCOUNTER — Ambulatory Visit (INDEPENDENT_AMBULATORY_CARE_PROVIDER_SITE_OTHER): Payer: Medicare Other | Admitting: Ophthalmology

## 2019-11-30 ENCOUNTER — Encounter (INDEPENDENT_AMBULATORY_CARE_PROVIDER_SITE_OTHER): Payer: Self-pay | Admitting: Ophthalmology

## 2019-11-30 DIAGNOSIS — H3581 Retinal edema: Secondary | ICD-10-CM

## 2019-11-30 DIAGNOSIS — H35033 Hypertensive retinopathy, bilateral: Secondary | ICD-10-CM | POA: Diagnosis not present

## 2019-11-30 DIAGNOSIS — I10 Essential (primary) hypertension: Secondary | ICD-10-CM

## 2019-11-30 DIAGNOSIS — H353122 Nonexudative age-related macular degeneration, left eye, intermediate dry stage: Secondary | ICD-10-CM | POA: Diagnosis not present

## 2019-11-30 DIAGNOSIS — H25813 Combined forms of age-related cataract, bilateral: Secondary | ICD-10-CM

## 2019-11-30 DIAGNOSIS — H353211 Exudative age-related macular degeneration, right eye, with active choroidal neovascularization: Secondary | ICD-10-CM

## 2019-11-30 MED ORDER — BEVACIZUMAB CHEMO INJECTION 1.25MG/0.05ML SYRINGE FOR KALEIDOSCOPE
1.2500 mg | INTRAVITREAL | Status: AC | PRN
  Administered 2019-11-30: 1.25 mg via INTRAVITREAL

## 2019-12-06 ENCOUNTER — Other Ambulatory Visit: Payer: Self-pay | Admitting: Family Medicine

## 2019-12-16 ENCOUNTER — Ambulatory Visit
Admission: RE | Admit: 2019-12-16 | Discharge: 2019-12-16 | Disposition: A | Discharge status: Home / Self Care | Payer: Medicare Other | Source: Ambulatory Visit | Attending: Family Medicine | Admitting: Family Medicine

## 2019-12-16 ENCOUNTER — Other Ambulatory Visit: Payer: Self-pay | Admitting: Family Medicine

## 2019-12-16 ENCOUNTER — Other Ambulatory Visit: Payer: Self-pay

## 2019-12-16 DIAGNOSIS — Z1231 Encounter for screening mammogram for malignant neoplasm of breast: Secondary | ICD-10-CM | POA: Diagnosis not present

## 2020-02-20 ENCOUNTER — Other Ambulatory Visit: Payer: Self-pay | Admitting: Family Medicine

## 2020-02-22 ENCOUNTER — Encounter (INDEPENDENT_AMBULATORY_CARE_PROVIDER_SITE_OTHER): Payer: Medicare Other | Admitting: Ophthalmology

## 2020-02-24 ENCOUNTER — Other Ambulatory Visit: Payer: Self-pay | Admitting: Family Medicine

## 2020-02-24 MED ORDER — OXYCODONE-ACETAMINOPHEN 10-325 MG PO TABS: 1.0000 | ORAL_TABLET | ORAL | 0 refills | Status: DC | PRN

## 2020-02-24 NOTE — Telephone Encounter (Signed)
Patient requesting a refill on Oxycodone     LOV:  11/12/2019  LRF:     11/12/2019

## 2020-02-24 NOTE — Telephone Encounter (Signed)
Patient asking for refill on oxycodone  °[facility]  °

## 2020-02-24 NOTE — Progress Notes (Addendum)
[facility] Note  02/28/2020   CHIEF COMPLAINT Patient presents for Retina Follow Up  HISTORY OF PRESENT ILLNESS: Faith Reeves is a 69 y.o. female who presents to the clinic today for:   HPI    Retina Follow Up    Patient presents with  Wet AMD.  In right eye.  This started 12 weeks ago.  Severity is moderate.  I, the attending physician,  performed the HPI with the patient and updated documentation appropriately.          Comments    Patient here for 12 weeks retina follow up for exu ARMD OD. Patient states vision doing good. No eye pain.        Last edited by Bernarda Caffey, MD on 02/28/2020  3:54 PM. (History)    13 wk f/u -- had to reschedule from last week   Referring physician: Susy Frizzle, MD [id number] [facility] [place],  [place] [address]  HISTORICAL INFORMATION:   Selected notes from the MEDICAL RECORD NUMBER Referred by Dr. Quentin Ore for concern of exu ARMD OU    CURRENT MEDICATIONS: No current outpatient medications on file. (Ophthalmic Drugs)   No current facility-administered medications for this visit. (Ophthalmic Drugs)   Current Outpatient Medications (Other)  Medication Sig  . albuterol (PROVENTIL HFA;VENTOLIN HFA) 108 (90 Base) MCG/ACT inhaler Inhale 2 puffs into the lungs every 4 (four) hours as needed for wheezing or shortness of breath.  Marland Kitchen amoxicillin (AMOXIL) 500 MG capsule Take 2,000 mg by mouth once. Prior to dental work  . celecoxib (CELEBREX) 200 MG capsule TAKE 1 CAPSULE BY MOUTH EVERY DAY  . cephALEXin (KEFLEX) 500 MG capsule Take 1 capsule (500 mg total) by mouth 3 (three) times daily.  Marland Kitchen gabapentin (NEURONTIN) 300 MG capsule TAKE 1 CAPSULE BY MOUTH THREE TIMES A DAY  . ipratropium (ATROVENT) 0.06 % nasal spray SPRAY 2 SPRAYS INTO EACH NOSTRIL 4 TIMES A DAY  . mometasone (ELOCON) 0.1 % cream Apply 1 application topically daily as needed.  Marland Kitchen oxyCODONE-acetaminophen (PERCOCET) 10-325 MG tablet Take 1 tablet  by mouth every 4 (four) hours as needed for pain (G89.4).  . pantoprazole (PROTONIX) 40 MG tablet TAKE 1 TABLET BY MOUTH EVERY DAY  . predniSONE (DELTASONE) 20 MG tablet 3 tabs poqday 1-2, 2 tabs poqday 3-4, 1 tab poqday 5-6  . sertraline (ZOLOFT) 50 MG tablet Take 1.5 tablets (75 mg total) by mouth daily.  Marland Kitchen telmisartan-hydrochlorothiazide (MICARDIS HCT) 80-12.5 MG tablet TAKE 1 TABLET BY MOUTH EVERY DAY  . valACYclovir (VALTREX) 1000 MG tablet Take 2 tablets (2,000 mg total) by mouth 2 (two) times daily.  Marland Kitchen zolpidem (AMBIEN) 10 MG tablet Take 1 tablet (10 mg total) by mouth at bedtime as needed for sleep.   Current Facility-Administered Medications (Other)  Medication Route  . Bevacizumab (AVASTIN) SOLN 1.25 mg Intravitreal      REVIEW OF SYSTEMS: ROS    Positive for: Eyes   Negative for: Constitutional, Gastrointestinal, Neurological, Skin, Genitourinary, Musculoskeletal, HENT, Endocrine, Cardiovascular, Respiratory, Psychiatric, Allergic/Imm, Heme/Lymph   Last edited by Theodore Demark, COA on 02/28/2020  3:04 PM. (History)       ALLERGIES Allergies  Allergen Reactions  . Cymbalta [Duloxetine Hcl] Swelling    PAST MEDICAL HISTORY Past Medical History:  Diagnosis Date  . Allergy   . Anemia   . Anxiety   . Blood transfusion without reported diagnosis   . Cataract   . GERD (  gastroesophageal reflux disease)   . Hypertension   . Hypertensive retinopathy    OU  . Macular degeneration    Past Surgical History:  Procedure Laterality Date  . ABDOMINAL HYSTERECTOMY    . ACHILLES TENDON REPAIR Bilateral   . APPENDECTOMY    . BACK SURGERY    . BILATERAL CARPAL TUNNEL RELEASE    . CHOLECYSTECTOMY    . COLONOSCOPY    . HERNIA REPAIR    . JOINT REPLACEMENT    . MELANOMA EXCISION Right 09/15/2017   Procedure: EXCISION LIPOSARCOMA RIGHT ARM;  Surgeon: Georganna Skeans, MD;  Location: [facility] [place];  Service: General;  Laterality: Right;  . SPINE SURGERY      FAMILY  HISTORY Family History  Problem Relation Age of Onset  . Hypertension Mother   . Cancer Sister   . Stroke Maternal Grandmother     SOCIAL HISTORY Social History   Tobacco Use  . Smoking status: Never Smoker  . Smokeless tobacco: Never Used  Substance Use Topics  . Alcohol use: Yes    Alcohol/week: 1.0 standard drinks    Types: 1 Glasses of wine per week  . Drug use: No         OPHTHALMIC EXAM:  Base Eye Exam    Visual Acuity (Snellen - Linear)      Right Left   Dist cc 20/30 +2 20/20 -1   Dist ph cc NI    Correction: Glasses       Tonometry (Tonopen, 3:02 PM)      Right Left   Pressure 13 15       Pupils      Dark Light Shape React APD   Right 4 3 Round Brisk None   Left 4 3 Round Brisk None       Visual Fields (Counting fingers)      Left Right    Full Full       Extraocular Movement      Right Left    Full, Ortho Full, Ortho       Neuro/Psych    Oriented x3: Yes   Mood/Affect: Normal       Dilation    Both eyes: 1.0% Mydriacyl, 2.5% Phenylephrine @ 3:01 PM        Slit Lamp and Fundus Exam    Slit Lamp Exam      Right Left   Lids/Lashes Dermatochalasis - upper lid, mild Meibomian gland dysfunction Dermatochalasis - upper lid, mild Meibomian gland dysfunction   Conjunctiva/Sclera White and quiet White and quiet   Cornea 2+ diffuse Punctate epithelial erosions 2+ diffuse Punctate epithelial erosions   Anterior Chamber deep and clear deep and clear   Iris Round and dilated Round and dilated   Lens 2-3+ Nuclear sclerosis, 2-3+ Cortical cataract 2+ Nuclear sclerosis, 2+ Cortical cataract   Vitreous Vitreous syneresis, Posterior vitreous detachment Vitreous syneresis, Posterior vitreous detachment       Fundus Exam      Right Left   Disc Pink and Sharp Pink and Sharp, temporal PPP   C/D Ratio 0.2 0.2   Macula Blunted foveal reflex, drusen, RPE mottling, clumping and atrophy, +CNV, shallow SRF stably resolved Flat, Blunted foveal reflex,  scattered Drusen, focal PEDs   Vessels Vascular attenuation Vascular attenuation   Periphery Attached, no heme Attached, no heme        Refraction    Wearing Rx      Sphere Cylinder Axis Add   Right +2.00 +0.50 008 +  2.50   Left +2.50 +1.00 154 +2.50   Type: PAL          IMAGING AND PROCEDURES  Imaging and Procedures for @TODAY @  OCT, Retina - OU - Both Eyes       Right Eye Quality was good. Central Foveal Thickness: 234. Progression has been stable. Findings include no IRF, retinal drusen , pigment epithelial detachment, outer retinal atrophy, normal foveal contour, no SRF (Stable resolution of SRF; persistent ORA/drusen; very low lying PED).   Left Eye Quality was good. Central Foveal Thickness: 269. Progression has been stable. Findings include normal foveal contour, no SRF, no IRF, retinal drusen .   Notes *Images captured and stored on drive  Diagnosis / Impression:  OD: exu ARMD -- Stable resolution of SRF; persistent ORA/drusen OS: non-exu ARMD -- stable   Clinical management:  See below  Abbreviations: NFP - Normal foveal profile. CME - cystoid macular edema. PED - pigment epithelial detachment. IRF - intraretinal fluid. SRF - subretinal fluid. EZ - ellipsoid zone. ERM - epiretinal membrane. ORA - outer retinal atrophy. ORT - outer retinal tubulation. SRHM - subretinal hyper-reflective material        Intravitreal Injection, Pharmacologic Agent - OD - Right Eye       Time Out 02/28/2020. 2:59 PM. Confirmed correct patient, procedure, site, and patient consented.   Anesthesia Topical anesthesia was used. Anesthetic medications included Lidocaine 2%, Proparacaine 0.5%.   Procedure Preparation included 5% betadine to ocular surface, eyelid speculum. A (32g) needle was used.   Injection:  1.25 mg Bevacizumab (AVASTIN) SOLN   NDC: [id number], Lot: [id number] , Expiration date: 05/30/2020   Route: Intravitreal, Site: Right Eye, Waste: 0  mL  Post-op Post injection exam found visual acuity of at least counting fingers. The patient tolerated the procedure well. There were no complications. The patient received written and verbal post procedure care education.                 ASSESSMENT/PLAN:    ICD-10-CM   1. Exudative age-related macular degeneration of right eye with active choroidal neovascularization (HCC)  H35.3211 Intravitreal Injection, Pharmacologic Agent - OD - Right Eye    Bevacizumab (AVASTIN) SOLN 1.25 mg  2. Retinal edema  H35.81 OCT, Retina - OU - Both Eyes  3. Intermediate stage nonexudative age-related macular degeneration of left eye  H35.3122   4. Essential hypertension  I10   5. Hypertensive retinopathy of both eyes  H35.033   6. Combined forms of age-related cataract of both eyes  H25.813   7. Glaucoma suspect of both eyes  H40.003     1,2. Exudative age related macular degeneration, right eye  - FA (03.10.20) shows +CNV w/ staining/leakage  - s/p IVA OD #1 (03.10.20), #2 (04.13.20), #3 (05.11.20), #4 (06.23.20), #5 (8.18.20), #6 (10.27.20), #7 (01.26.21)  - OCT today shows stable resolution of SRF at 13 wks  - BCVA 20/30  - recommend IVA OD #8 today, 04.26.21 -- maintenance  - pt wishes to be treated with IVA  - RBA of procedure discussed, questions answered  - informed consent obtained and signed  - see procedure note  - f/u in 14 wks -- DFE/OCT/possible injection  3. Age related macular degeneration, non-exudative, left eye  - The incidence, anatomy, and pathology of dry AMD, risk of progression, and the AREDS and AREDS 2 study including smoking risks discussed with patient.  - intermediate stage  - recommend amsler grid monitoring  4,5. Hypertensive retinopathy OU  -  discussed importance of tight BP control  - monitor  6. Mixed form age related cataracts OU  - The symptoms of cataract, surgical options, and treatments and risks were discussed with patient.  - discussed diagnosis  and progression  - not yet visually significant  - monitor for now  7. Glaucoma Suspect OU  - IOP 13,15 today  - under the expert management of Dr. Kathlen Mody   Ophthalmic Meds Ordered this visit:  Meds ordered this encounter  Medications  . Bevacizumab (AVASTIN) SOLN 1.25 mg   Return in about 14 weeks (around 06/05/2020) for f/u exu ARMD OD, DFE, OCT.  There are no Patient Instructions on file for this visit.  Explained the diagnoses, plan, and follow up with the patient and they expressed understanding.  Patient expressed understanding of the importance of proper follow up care.   This document serves as a record of services personally performed by Gardiner Sleeper, MD, PhD. It was created on their behalf by Leeann Must, [place], a certified ophthalmic assistant. The creation of this record is the provider's dictation and/or activities during the visit.    Electronically signed by: Leeann Must, COA @TODAY @ 4:36 PM   This document serves as a record of services personally performed by Gardiner Sleeper, MD, PhD. It was created on their behalf by Ernest Mallick, OA, an ophthalmic assistant. The creation of this record is the provider's dictation and/or activities during the visit.    Electronically signed by: Ernest Mallick, OA 04.26.2021 4:36 PM   Gardiner Sleeper, M.D., Ph.D. Diseases & Surgery of the Retina and Vitreous [facility]  I have reviewed the above documentation for accuracy and completeness, and I agree with the above. Gardiner Sleeper, M.D., Ph.D. 02/28/20 4:36 PM   Abbreviations: M myopia (nearsighted); A astigmatism; H hyperopia (farsighted); P presbyopia; Mrx spectacle prescription;  CTL contact lenses; OD right eye; OS left eye; OU both eyes  XT exotropia; ET esotropia; PEK punctate epithelial keratitis; PEE punctate epithelial erosions; DES dry eye syndrome; MGD meibomian gland dysfunction; ATs artificial tears; PFAT's preservative free artificial  tears; [place] nuclear sclerotic cataract; PSC posterior subcapsular cataract; ERM epi-retinal membrane; PVD posterior vitreous detachment; RD retinal detachment; DM diabetes mellitus; DR diabetic retinopathy; NPDR non-proliferative diabetic retinopathy; PDR proliferative diabetic retinopathy; CSME clinically significant macular edema; DME diabetic macular edema; dbh dot blot hemorrhages; CWS cotton wool spot; POAG primary open angle glaucoma; C/D cup-to-disc ratio; HVF humphrey visual field; GVF goldmann visual field; OCT optical coherence tomography; IOP intraocular pressure; BRVO Branch retinal vein occlusion; CRVO central retinal vein occlusion; CRAO central retinal artery occlusion; BRAO branch retinal artery occlusion; RT retinal tear; SB scleral buckle; PPV pars plana vitrectomy; VH Vitreous hemorrhage; PRP panretinal laser photocoagulation; IVK intravitreal kenalog; VMT vitreomacular traction; MH Macular hole;  NVD neovascularization of the disc; NVE neovascularization elsewhere; AREDS age related eye disease study; ARMD age related macular degeneration; POAG primary open angle glaucoma; EBMD epithelial/anterior basement membrane dystrophy; ACIOL anterior chamber intraocular lens; IOL intraocular lens; PCIOL posterior chamber intraocular lens; Phaco/IOL phacoemulsification with intraocular lens placement; [place] photorefractive keratectomy; LASIK laser assisted in situ keratomileusis; HTN hypertension; DM diabetes mellitus; COPD chronic obstructive pulmonary disease

## 2020-02-28 ENCOUNTER — Ambulatory Visit (INDEPENDENT_AMBULATORY_CARE_PROVIDER_SITE_OTHER): Payer: Medicare Other | Admitting: Ophthalmology

## 2020-02-28 ENCOUNTER — Encounter (INDEPENDENT_AMBULATORY_CARE_PROVIDER_SITE_OTHER): Payer: Self-pay | Admitting: Ophthalmology

## 2020-02-28 ENCOUNTER — Other Ambulatory Visit: Payer: Self-pay

## 2020-02-28 DIAGNOSIS — H40003 Preglaucoma, unspecified, bilateral: Secondary | ICD-10-CM | POA: Diagnosis not present

## 2020-02-28 DIAGNOSIS — H25813 Combined forms of age-related cataract, bilateral: Secondary | ICD-10-CM | POA: Diagnosis not present

## 2020-02-28 DIAGNOSIS — H35033 Hypertensive retinopathy, bilateral: Secondary | ICD-10-CM | POA: Diagnosis not present

## 2020-02-28 DIAGNOSIS — H3581 Retinal edema: Secondary | ICD-10-CM

## 2020-02-28 DIAGNOSIS — I10 Essential (primary) hypertension: Secondary | ICD-10-CM | POA: Diagnosis not present

## 2020-02-28 DIAGNOSIS — H353211 Exudative age-related macular degeneration, right eye, with active choroidal neovascularization: Secondary | ICD-10-CM | POA: Diagnosis not present

## 2020-02-28 DIAGNOSIS — H353122 Nonexudative age-related macular degeneration, left eye, intermediate dry stage: Secondary | ICD-10-CM | POA: Diagnosis not present

## 2020-02-28 MED ORDER — BEVACIZUMAB CHEMO INJECTION 1.25MG/0.05ML SYRINGE FOR KALEIDOSCOPE
1.2500 mg | INTRAVITREAL | Status: AC | PRN
  Administered 2020-02-28: 1.25 mg via INTRAVITREAL

## 2020-03-21 DIAGNOSIS — H40013 Open angle with borderline findings, low risk, bilateral: Secondary | ICD-10-CM | POA: Diagnosis not present

## 2020-03-21 DIAGNOSIS — H40033 Anatomical narrow angle, bilateral: Secondary | ICD-10-CM | POA: Diagnosis not present

## 2020-03-30 ENCOUNTER — Other Ambulatory Visit: Payer: Self-pay | Admitting: Family Medicine

## 2020-04-21 ENCOUNTER — Other Ambulatory Visit: Payer: Self-pay

## 2020-04-21 MED ORDER — ZOLPIDEM TARTRATE 10 MG PO TABS: 10.0000 mg | ORAL_TABLET | Freq: Every evening | ORAL | 5 refills | Status: DC | PRN

## 2020-05-21 ENCOUNTER — Other Ambulatory Visit: Payer: Self-pay | Admitting: Family Medicine

## 2020-05-26 ENCOUNTER — Telehealth: Payer: Self-pay | Admitting: Family Medicine

## 2020-05-26 NOTE — Telephone Encounter (Signed)
Husband called requesting refill on oxycodone called into [facility] on [address]  CB# [phone]

## 2020-05-29 ENCOUNTER — Other Ambulatory Visit: Payer: Self-pay

## 2020-05-29 MED ORDER — OXYCODONE-ACETAMINOPHEN 10-325 MG PO TABS: 1.0000 | ORAL_TABLET | ORAL | 0 refills | Status: DC | PRN

## 2020-05-29 NOTE — Telephone Encounter (Signed)
Last refill  02/24/20

## 2020-06-01 NOTE — Progress Notes (Signed)
[facility] Note  06/05/2020   CHIEF COMPLAINT Patient presents for Retina Follow Up  HISTORY OF PRESENT ILLNESS: Faith Reeves is a 69 y.o. female who presents to the clinic today for:   HPI    Retina Follow Up    Patient presents with  Wet AMD.  In right eye.  This started weeks ago.  Severity is moderate.  Duration of weeks.  Since onset it is stable.  I, the attending physician,  performed the HPI with the patient and updated documentation appropriately.          Comments    Pt states vision is about the same.  Pt denies eye pain.  Has FBS OS.  Patient denies any new or worsening floaters or fol OU.       Last edited by Bernarda Caffey, MD on 06/05/2020  1:26 PM. (History)    pt feels like vision is the same, pt recently saw Dr. Kathlen Mody and had a VF test and she was not seeing all the points she was supposed to see   Referring physician: Susy Frizzle, MD [id number] [facility] [place],  [place] [address]  HISTORICAL INFORMATION:   Selected notes from the MEDICAL RECORD NUMBER Referred by Dr. Quentin Ore for concern of exu ARMD OU    CURRENT MEDICATIONS: No current outpatient medications on file. (Ophthalmic Drugs)   No current facility-administered medications for this visit. (Ophthalmic Drugs)   Current Outpatient Medications (Other)  Medication Sig  . albuterol (PROVENTIL HFA;VENTOLIN HFA) 108 (90 Base) MCG/ACT inhaler Inhale 2 puffs into the lungs every 4 (four) hours as needed for wheezing or shortness of breath.  Marland Kitchen amoxicillin (AMOXIL) 500 MG capsule Take 2,000 mg by mouth once. Prior to dental work  . celecoxib (CELEBREX) 200 MG capsule TAKE 1 CAPSULE BY MOUTH EVERY DAY  . cephALEXin (KEFLEX) 500 MG capsule Take 1 capsule (500 mg total) by mouth 3 (three) times daily.  Marland Kitchen gabapentin (NEURONTIN) 300 MG capsule TAKE 1 CAPSULE BY MOUTH THREE TIMES A DAY  . ipratropium (ATROVENT) 0.06 % nasal spray SPRAY 2 SPRAYS INTO EACH NOSTRIL 4 TIMES A  DAY  . mometasone (ELOCON) 0.1 % cream Apply 1 application topically daily as needed.  Marland Kitchen oxyCODONE-acetaminophen (PERCOCET) 10-325 MG tablet Take 1 tablet by mouth every 4 (four) hours as needed for pain (G89.4).  . pantoprazole (PROTONIX) 40 MG tablet TAKE 1 TABLET BY MOUTH EVERY DAY  . predniSONE (DELTASONE) 20 MG tablet 3 tabs poqday 1-2, 2 tabs poqday 3-4, 1 tab poqday 5-6  . sertraline (ZOLOFT) 50 MG tablet Take 1.5 tablets (75 mg total) by mouth daily.  Marland Kitchen telmisartan-hydrochlorothiazide (MICARDIS HCT) 80-12.5 MG tablet TAKE 1 TABLET BY MOUTH EVERY DAY  . valACYclovir (VALTREX) 1000 MG tablet Take 2 tablets (2,000 mg total) by mouth 2 (two) times daily.  Marland Kitchen zolpidem (AMBIEN) 10 MG tablet Take 1 tablet (10 mg total) by mouth at bedtime as needed for sleep.   Current Facility-Administered Medications (Other)  Medication Route  . Bevacizumab (AVASTIN) SOLN 1.25 mg Intravitreal      REVIEW OF SYSTEMS: ROS    Positive for: Eyes   Negative for: Constitutional, Gastrointestinal, Neurological, Skin, Genitourinary, Musculoskeletal, HENT, Endocrine, Cardiovascular, Respiratory, Psychiatric, Allergic/Imm, Heme/Lymph   Last edited by Doneen Poisson on 06/05/2020  1:14 PM. (History)       ALLERGIES Allergies  Allergen Reactions  . Cymbalta [Duloxetine Hcl] Swelling    PAST MEDICAL  HISTORY Past Medical History:  Diagnosis Date  . Allergy   . Anemia   . Anxiety   . Blood transfusion without reported diagnosis   . Cataract   . GERD (gastroesophageal reflux disease)   . Hypertension   . Hypertensive retinopathy    OU  . Macular degeneration    Past Surgical History:  Procedure Laterality Date  . ABDOMINAL HYSTERECTOMY    . ACHILLES TENDON REPAIR Bilateral   . APPENDECTOMY    . BACK SURGERY    . BILATERAL CARPAL TUNNEL RELEASE    . CHOLECYSTECTOMY    . COLONOSCOPY    . HERNIA REPAIR    . JOINT REPLACEMENT    . MELANOMA EXCISION Right 09/15/2017   Procedure: EXCISION  LIPOSARCOMA RIGHT ARM;  Surgeon: Georganna Skeans, MD;  Location: [facility];  Service: General;  Laterality: Right;  . SPINE SURGERY      FAMILY HISTORY Family History  Problem Relation Age of Onset  . Hypertension Mother   . Cancer Sister   . Stroke Maternal Grandmother     SOCIAL HISTORY Social History   Tobacco Use  . Smoking status: Never Smoker  . Smokeless tobacco: Never Used  Vaping Use  . Vaping Use: Never used  Substance Use Topics  . Alcohol use: Yes    Alcohol/week: 1.0 standard drink    Types: 1 Glasses of wine per week  . Drug use: No         OPHTHALMIC EXAM:  Base Eye Exam    Visual Acuity (Snellen - Linear)      Right Left   Dist cc 20/30 -2 20/20 -1   Dist ph cc 20/25 -2    Correction: Glasses       Tonometry (Tonopen, 1:21 PM)      Right Left   Pressure 15 15       Pupils      Dark Light Shape React APD   Right 3 2 Round Minimal 0   Left 3 2 Round Minimal 0       Visual Fields      Left Right    Full Full       Extraocular Movement      Right Left    Full Full       Neuro/Psych    Oriented x3: Yes   Mood/Affect: Normal       Dilation    Both eyes: 1.0% Mydriacyl, 2.5% Phenylephrine @ 1:21 PM        Slit Lamp and Fundus Exam    Slit Lamp Exam      Right Left   Lids/Lashes Dermatochalasis - upper lid, mild Meibomian gland dysfunction Dermatochalasis - upper lid, mild Meibomian gland dysfunction   Conjunctiva/Sclera White and quiet White and quiet   Cornea 2+ diffuse Punctate epithelial erosions 2+ diffuse Punctate epithelial erosions   Anterior Chamber deep and clear deep and clear   Iris Round and dilated Round and dilated   Lens 2-3+ Nuclear sclerosis, 2-3+ Cortical cataract 2+ Nuclear sclerosis, 2+ Cortical cataract   Vitreous Vitreous syneresis, Posterior vitreous detachment Vitreous syneresis, Posterior vitreous detachment       Fundus Exam      Right Left   Disc Pink and Sharp Pink and Sharp, temporal PPP   C/D  Ratio 0.2 0.1   Macula Blunted foveal reflex, drusen, RPE mottling, clumping and atrophy, +CNV, interval development of shallow SRF  Flat, Blunted foveal reflex, scattered Drusen, focal PEDs  Vessels Vascular attenuation Vascular attenuation, mild tortuousity   Periphery Attached, mild reticular degeneration no heme Attached, no heme        Refraction    Wearing Rx      Sphere Cylinder Axis Add   Right +2.00 +0.50 008 +2.50   Left +2.50 +1.00 154 +2.50   Type: PAL          IMAGING AND PROCEDURES  Imaging and Procedures for @TODAY @  OCT, Retina - OU - Both Eyes       Right Eye Quality was good. Central Foveal Thickness: 315. Progression has worsened. Findings include no IRF, retinal drusen , pigment epithelial detachment, outer retinal atrophy, normal foveal contour, subretinal fluid (Interval re-development of SRF).   Left Eye Quality was good. Central Foveal Thickness: 267. Progression has been stable. Findings include normal foveal contour, no SRF, no IRF, retinal drusen .   Notes *Images captured and stored on drive  Diagnosis / Impression:  OD: exu ARMD -- Interval re-development of SRF OS: non-exu ARMD -- stable   Clinical management:  See below  Abbreviations: NFP - Normal foveal profile. CME - cystoid macular edema. PED - pigment epithelial detachment. IRF - intraretinal fluid. SRF - subretinal fluid. EZ - ellipsoid zone. ERM - epiretinal membrane. ORA - outer retinal atrophy. ORT - outer retinal tubulation. SRHM - subretinal hyper-reflective material        Intravitreal Injection, Pharmacologic Agent - OD - Right Eye       Time Out 06/05/2020. 1:26 PM. Confirmed correct patient, procedure, site, and patient consented.   Anesthesia Topical anesthesia was used. Anesthetic medications included Lidocaine 2%, Proparacaine 0.5%.   Procedure Preparation included 5% betadine to ocular surface, eyelid speculum. A supplied needle was used.   Injection:  1.25  mg Bevacizumab (AVASTIN) SOLN   NDC: 90240-973-53, Lot: [id number] , Expiration date: 07/13/2020   Route: Intravitreal, Site: Right Eye, Waste: 0 mg  Post-op Post injection exam found visual acuity of at least counting fingers. The patient tolerated the procedure well. There were no complications. The patient received written and verbal post procedure care education.                 ASSESSMENT/PLAN:    ICD-10-CM   1. Exudative age-related macular degeneration of right eye with active choroidal neovascularization (HCC)  H35.3211 Intravitreal Injection, Pharmacologic Agent - OD - Right Eye    Bevacizumab (AVASTIN) SOLN 1.25 mg  2. Retinal edema  H35.81 OCT, Retina - OU - Both Eyes  3. Intermediate stage nonexudative age-related macular degeneration of left eye  H35.3122   4. Essential hypertension  I10   5. Hypertensive retinopathy of both eyes  H35.033   6. Combined forms of age-related cataract of both eyes  H25.813   7. Glaucoma suspect of both eyes  H40.003     1,2. Exudative age related macular degeneration, right eye  - FA (03.10.20) shows +CNV w/ staining/leakage  - s/p IVA OD #1 (03.10.20), #2 (04.13.20), #3 (05.11.20), #4 (06.23.20), #5 (8.18.20), #6 (10.27.20), #7 (01.26.21), #8 (04.26.21)  - OCT today shows interval recurrence of SRF at 14 wks  - BCVA 20/25  - recommend IVA OD #9 today, 08.02.21 with reduction in interval back to 6 wks  - pt wishes to be treated with IVA OD  - RBA of procedure discussed, questions answered  - informed consent obtained and signed  - see procedure note  - f/u in 6 wks -- DFE/OCT/possible injection  3. Age related macular  degeneration, non-exudative, left eye  - The incidence, anatomy, and pathology of dry AMD, risk of progression, and the AREDS and AREDS 2 study including smoking risks discussed with patient.  - intermediate stage  - recommend amsler grid monitoring  4,5. Hypertensive retinopathy OU  - discussed importance of tight  BP control  - monitor  6. Mixed form age related cataracts OU  - The symptoms of cataract, surgical options, and treatments and risks were discussed with patient.  - discussed diagnosis and progression  - not yet visually significant  - monitor for now  7. Glaucoma Suspect OU  - IOP 15 OU today  - under the expert management of Dr. Kathlen Mody   Ophthalmic Meds Ordered this visit:  Meds ordered this encounter  Medications  . Bevacizumab (AVASTIN) SOLN 1.25 mg   Return in about 6 weeks (around 07/17/2020) for f/u exu ARMD OU, DFE, OCT.  There are no Patient Instructions on file for this visit.  Explained the diagnoses, plan, and follow up with the patient and they expressed understanding.  Patient expressed understanding of the importance of proper follow up care.   This document serves as a record of services personally performed by Gardiner Sleeper, MD, PhD. It was created on their behalf by Leonie Douglas, an ophthalmic technician. The creation of this record is the provider's dictation and/or activities during the visit.    Electronically signed by: Leonie Douglas COA, 06/05/20  4:15 PM   This document serves as a record of services personally performed by Gardiner Sleeper, MD, PhD. It was created on their behalf by San Jetty. Owens Shark, OA an ophthalmic technician. The creation of this record is the provider's dictation and/or activities during the visit.    Electronically signed by: San Jetty. Owens Shark, [place] 08.02.2021 4:15 PM  Gardiner Sleeper, M.D., Ph.D. Diseases & Surgery of the Retina and Vitreous [facility]  I have reviewed the above documentation for accuracy and completeness, and I agree with the above. Gardiner Sleeper, M.D., Ph.D. 06/05/20 4:15 PM   Abbreviations: M myopia (nearsighted); A astigmatism; H hyperopia (farsighted); P presbyopia; Mrx spectacle prescription;  CTL contact lenses; OD right eye; OS left eye; OU both eyes  XT exotropia; ET esotropia; PEK  punctate epithelial keratitis; PEE punctate epithelial erosions; DES dry eye syndrome; MGD meibomian gland dysfunction; ATs artificial tears; PFAT's preservative free artificial tears; [place] nuclear sclerotic cataract; PSC posterior subcapsular cataract; ERM epi-retinal membrane; PVD posterior vitreous detachment; RD retinal detachment; DM diabetes mellitus; DR diabetic retinopathy; NPDR non-proliferative diabetic retinopathy; PDR proliferative diabetic retinopathy; CSME clinically significant macular edema; DME diabetic macular edema; dbh dot blot hemorrhages; CWS cotton wool spot; POAG primary open angle glaucoma; C/D cup-to-disc ratio; HVF humphrey visual field; GVF goldmann visual field; OCT optical coherence tomography; IOP intraocular pressure; BRVO Branch retinal vein occlusion; CRVO central retinal vein occlusion; CRAO central retinal artery occlusion; BRAO branch retinal artery occlusion; RT retinal tear; SB scleral buckle; PPV pars plana vitrectomy; VH Vitreous hemorrhage; PRP panretinal laser photocoagulation; IVK intravitreal kenalog; VMT vitreomacular traction; MH Macular hole;  NVD neovascularization of the disc; NVE neovascularization elsewhere; AREDS age related eye disease study; ARMD age related macular degeneration; POAG primary open angle glaucoma; EBMD epithelial/anterior basement membrane dystrophy; ACIOL anterior chamber intraocular lens; IOL intraocular lens; PCIOL posterior chamber intraocular lens; Phaco/IOL phacoemulsification with intraocular lens placement; [place] photorefractive keratectomy; LASIK laser assisted in situ keratomileusis; HTN hypertension; DM diabetes mellitus; COPD chronic obstructive pulmonary disease

## 2020-06-05 ENCOUNTER — Encounter (INDEPENDENT_AMBULATORY_CARE_PROVIDER_SITE_OTHER): Payer: Self-pay | Admitting: Ophthalmology

## 2020-06-05 ENCOUNTER — Ambulatory Visit (INDEPENDENT_AMBULATORY_CARE_PROVIDER_SITE_OTHER): Payer: Medicare Other | Admitting: Ophthalmology

## 2020-06-05 ENCOUNTER — Other Ambulatory Visit: Payer: Self-pay

## 2020-06-05 DIAGNOSIS — I10 Essential (primary) hypertension: Secondary | ICD-10-CM | POA: Diagnosis not present

## 2020-06-05 DIAGNOSIS — H40003 Preglaucoma, unspecified, bilateral: Secondary | ICD-10-CM

## 2020-06-05 DIAGNOSIS — H353122 Nonexudative age-related macular degeneration, left eye, intermediate dry stage: Secondary | ICD-10-CM | POA: Diagnosis not present

## 2020-06-05 DIAGNOSIS — H353211 Exudative age-related macular degeneration, right eye, with active choroidal neovascularization: Secondary | ICD-10-CM | POA: Diagnosis not present

## 2020-06-05 DIAGNOSIS — H25813 Combined forms of age-related cataract, bilateral: Secondary | ICD-10-CM | POA: Diagnosis not present

## 2020-06-05 DIAGNOSIS — H3581 Retinal edema: Secondary | ICD-10-CM | POA: Diagnosis not present

## 2020-06-05 DIAGNOSIS — H35033 Hypertensive retinopathy, bilateral: Secondary | ICD-10-CM | POA: Diagnosis not present

## 2020-06-05 MED ORDER — BEVACIZUMAB CHEMO INJECTION 1.25MG/0.05ML SYRINGE FOR KALEIDOSCOPE
1.2500 mg | INTRAVITREAL | Status: AC | PRN
  Administered 2020-06-05: 1.25 mg via INTRAVITREAL

## 2020-07-04 ENCOUNTER — Other Ambulatory Visit: Payer: Self-pay | Admitting: Family Medicine

## 2020-07-13 NOTE — Progress Notes (Signed)
[facility] Note  07/17/2020   CHIEF COMPLAINT Patient presents for Retina Follow Up  HISTORY OF PRESENT ILLNESS: Faith Reeves is a 68 y.o. female who presents to the clinic today for:   HPI    Retina Follow Up    Patient presents with  Wet AMD.  In right eye.  This started weeks ago.  Severity is moderate.  Duration of weeks.  Since onset it is stable.  I, the attending physician,  performed the HPI with the patient and updated documentation appropriately.          Comments    Pt states vision is good some days and bad others.  Patient denies eye pain--states however that she accumulates a lot of "gunk" in the nasal corner of her right eye.  Patient denies any new or worsening floaters or fol OU.       Last edited by Bernarda Caffey, MD on 07/17/2020  1:31 PM. (History)    pt feels like her right eye vision is not doing very well, she states she lives a very stressful life, her son has had 3 strokes and an aneurysm and her husband has a "chemical imbalance"  Referring physician: Hortencia Pilar, MD [place],   [id number]  HISTORICAL INFORMATION:   Selected notes from the [place] Referred by Dr. Quentin Ore for concern of exu ARMD OU    CURRENT MEDICATIONS: No current outpatient medications on file. (Ophthalmic Drugs)   No current facility-administered medications for this visit. (Ophthalmic Drugs)   Current Outpatient Medications (Other)  Medication Sig  . albuterol (PROVENTIL HFA;VENTOLIN HFA) 108 (90 Base) MCG/ACT inhaler Inhale 2 puffs into the lungs every 4 (four) hours as needed for wheezing or shortness of breath.  Marland Kitchen amoxicillin (AMOXIL) 500 MG capsule Take 2,000 mg by mouth once. Prior to dental work  . celecoxib (CELEBREX) 200 MG capsule TAKE 1 CAPSULE BY MOUTH EVERY DAY  . cephALEXin (KEFLEX) 500 MG capsule Take 1 capsule (500 mg total) by mouth 3 (three) times daily.  Marland Kitchen gabapentin (NEURONTIN)  300 MG capsule TAKE 1 CAPSULE BY MOUTH THREE TIMES A DAY  . ipratropium (ATROVENT) 0.06 % nasal spray SPRAY 2 SPRAYS INTO EACH NOSTRIL 4 TIMES A DAY  . mometasone (ELOCON) 0.1 % cream Apply 1 application topically daily as needed.  Marland Kitchen oxyCODONE-acetaminophen (PERCOCET) 10-325 MG tablet Take 1 tablet by mouth every 4 (four) hours as needed for pain (G89.4).  . pantoprazole (PROTONIX) 40 MG tablet TAKE 1 TABLET BY MOUTH EVERY DAY  . predniSONE (DELTASONE) 20 MG tablet 3 tabs poqday 1-2, 2 tabs poqday 3-4, 1 tab poqday 5-6  . sertraline (ZOLOFT) 50 MG tablet Take 1.5 tablets (75 mg total) by mouth daily.  Marland Kitchen telmisartan-hydrochlorothiazide (MICARDIS HCT) 80-12.5 MG tablet TAKE 1 TABLET BY MOUTH EVERY DAY  . valACYclovir (VALTREX) 1000 MG tablet Take 2 tablets (2,000 mg total) by mouth 2 (two) times daily.  Marland Kitchen zolpidem (AMBIEN) 10 MG tablet Take 1 tablet (10 mg total) by mouth at bedtime as needed for sleep.   Current Facility-Administered Medications (Other)  Medication Route  . Bevacizumab (AVASTIN) SOLN 1.25 mg Intravitreal      REVIEW OF SYSTEMS: ROS    Positive for: Eyes   Negative for: Constitutional, Gastrointestinal, Neurological, Skin, Genitourinary, Musculoskeletal, HENT, Endocrine, Cardiovascular, Respiratory, Psychiatric, Allergic/Imm, Heme/Lymph   Last edited by Doneen Poisson on 07/17/2020  1:21 PM. (History)  ALLERGIES Allergies  Allergen Reactions  . Cymbalta [Duloxetine Hcl] Swelling    PAST MEDICAL HISTORY Past Medical History:  Diagnosis Date  . Allergy   . Anemia   . Anxiety   . Blood transfusion without reported diagnosis   . Cataract   . GERD (gastroesophageal reflux disease)   . Hypertension   . Hypertensive retinopathy    OU  . Macular degeneration    Past Surgical History:  Procedure Laterality Date  . ABDOMINAL HYSTERECTOMY    . ACHILLES TENDON REPAIR Bilateral   . APPENDECTOMY    . BACK SURGERY    . BILATERAL CARPAL TUNNEL RELEASE    .  CHOLECYSTECTOMY    . COLONOSCOPY    . HERNIA REPAIR    . JOINT REPLACEMENT    . MELANOMA EXCISION Right 09/15/2017   Procedure: EXCISION LIPOSARCOMA RIGHT ARM;  Surgeon: Georganna Skeans, MD;  Location: [facility];  Service: General;  Laterality: Right;  . SPINE SURGERY      FAMILY HISTORY Family History  Problem Relation Age of Onset  . Hypertension Mother   . Cancer Sister   . Stroke Maternal Grandmother     SOCIAL HISTORY Social History   Tobacco Use  . Smoking status: Never Smoker  . Smokeless tobacco: Never Used  Vaping Use  . Vaping Use: Never used  Substance Use Topics  . Alcohol use: Yes    Alcohol/week: 1.0 standard drink    Types: 1 Glasses of wine per week  . Drug use: No         OPHTHALMIC EXAM:  Base Eye Exam    Visual Acuity (Snellen - Linear)      Right Left   Dist cc 20/30 -2 20/20 -2   Dist ph cc 20/30 +1    Correction: Glasses       Tonometry (Tonopen, 1:23 PM)      Right Left   Pressure 18 18       Pupils      Dark Light Shape React APD   Right 3 2 Round Brisk 0   Left 3 2 Round Brisk 0       Visual Fields      Left Right    Full Full       Extraocular Movement      Right Left    Full Full       Neuro/Psych    Oriented x3: Yes   Mood/Affect: Normal       Dilation    Both eyes: 1.0% Mydriacyl, 2.5% Phenylephrine @ 1:24 PM        Slit Lamp and Fundus Exam    Slit Lamp Exam      Right Left   Lids/Lashes Dermatochalasis - upper lid, mild Meibomian gland dysfunction Dermatochalasis - upper lid, mild Meibomian gland dysfunction   Conjunctiva/Sclera White and quiet White and quiet   Cornea 2+ diffuse Punctate epithelial erosions 2+ diffuse Punctate epithelial erosions   Anterior Chamber deep and clear deep and clear   Iris Round and dilated Round and dilated   Lens 2-3+ Nuclear sclerosis, 2-3+ Cortical cataract 2+ Nuclear sclerosis, 2+ Cortical cataract   Vitreous Vitreous syneresis, Posterior vitreous detachment Vitreous  syneresis, Posterior vitreous detachment       Fundus Exam      Right Left   Disc Pink and Sharp Pink and Sharp, temporal PPP   C/D Ratio 0.2 0.1   Macula Blunted foveal reflex, drusen, RPE mottling, clumping and atrophy, +CNV,  interval increase in central SRF  Flat, Blunted foveal reflex, scattered Drusen, focal PEDs   Vessels Vascular attenuation, Tortuous Vascular attenuation, mild tortuousity   Periphery Attached, mild reticular degeneration no heme Attached, no heme        Refraction    Wearing Rx      Sphere Cylinder Axis Add   Right +2.00 +0.50 008 +2.50   Left +2.50 +1.00 154 +2.50   Type: PAL          IMAGING AND PROCEDURES  Imaging and Procedures for @TODAY @  OCT, Retina - OU - Both Eyes       Right Eye Quality was good. Central Foveal Thickness: 401. Progression has worsened. Findings include no IRF, retinal drusen , pigment epithelial detachment, outer retinal atrophy, normal foveal contour, subretinal fluid (Mild Interval increase in SRF).   Left Eye Quality was good. Central Foveal Thickness: 267. Progression has been stable. Findings include normal foveal contour, no SRF, no IRF, retinal drusen .   Notes *Images captured and stored on drive  Diagnosis / Impression:  OD: exu ARMD -- Mild Interval increase in SRF OS: non-exu ARMD -- stable   Clinical management:  See below  Abbreviations: NFP - Normal foveal profile. CME - cystoid macular edema. PED - pigment epithelial detachment. IRF - intraretinal fluid. SRF - subretinal fluid. EZ - ellipsoid zone. ERM - epiretinal membrane. ORA - outer retinal atrophy. ORT - outer retinal tubulation. SRHM - subretinal hyper-reflective material        Intravitreal Injection, Pharmacologic Agent - OD - Right Eye       Time Out 07/17/2020. 2:13 PM. Confirmed correct patient, procedure, site, and patient consented.   Anesthesia Topical anesthesia was used. Anesthetic medications included Lidocaine 2%,  Proparacaine 0.5%.   Procedure Preparation included 5% betadine to ocular surface, eyelid speculum. A supplied needle was used.   Injection:  1.25 mg Bevacizumab (AVASTIN) SOLN   NDC: 69629-528-41, Lot: [id number] , Expiration date: 08/20/2020   Route: Intravitreal, Site: Right Eye, Waste: 0 mL  Post-op Post injection exam found visual acuity of at least counting fingers. The patient tolerated the procedure well. There were no complications. The patient received written and verbal post procedure care education. Post injection medications were not given.        Fluorescein Angiography Optos (Transit OD)       Right Eye   Progression has worsened. Early phase findings include choroidal neovascularization, staining. Mid/Late phase findings include choroidal neovascularization, staining (Interval increase in perifoveal leakage compared to January 12, 2019 study).   Left Eye   Progression has no prior data. Early phase findings include staining. Mid/Late phase findings include staining (Scattered peripheral staining, focal staining superior and inferior macula just inside arcades).   Notes Images stored on drive;   Impression: OD: exudative ARMD with central CNV -- Interval increase in perifoveal leakage compared to January 12, 2019 study OS: nonexudative ARMD with staining of drusen                  ASSESSMENT/PLAN:    ICD-10-CM   1. Exudative age-related macular degeneration of right eye with active choroidal neovascularization (HCC)  H35.3211 Intravitreal Injection, Pharmacologic Agent - OD - Right Eye    Bevacizumab (AVASTIN) SOLN 1.25 mg  2. Retinal edema  H35.81 OCT, Retina - OU - Both Eyes  3. Intermediate stage nonexudative age-related macular degeneration of left eye  H35.3122   4. Essential hypertension  I10   5. Hypertensive retinopathy of  both eyes  H35.033 Fluorescein Angiography Optos (Transit OD)  6. Combined forms of age-related cataract of both eyes   H25.813   7. Glaucoma suspect of both eyes  H40.003     1,2. Exudative age related macular degeneration, right eye  - FA (03.10.20) shows +CNV w/ staining/leakage  - s/p IVA OD #1 (03.10.20), #2 (04.13.20), #3 (05.11.20), #4 (06.23.20), #5 (8.18.20), #6 (10.27.20), #7 (01.26.21), #8 (04.26.21), #9 (08.02.21)  - OCT today shows mild interval increase in SRF at 6 wks -- ?CSCR-like SRF collection  - BCVA slightly decreased to 20/30 from 20/25  - FA 9.13.21 shows interval increase in perifoveal leakage OD -- no classic CSCR findings  - recommend IVA OD #10 today, 09.13.21 with reduction in interval back to 4 wks  - pt was doing very well with q3 mo maintenance injections, but now with interval increase in central SRF with last two visits -- IVA resistance vs interval too long  - pt wishes to be treated with IVA OD - consider switching medications at next visit  - RBA of procedure discussed, questions answered  - informed consent obtained and signed  - see procedure note  - f/u in 4 wks -- DFE/OCT/possible injection  3. Age related macular degeneration, non-exudative, left eye  - The incidence, anatomy, and pathology of dry AMD, risk of progression, and the AREDS and AREDS 2 study including smoking risks discussed with patient.  - intermediate stage  - recommend amsler grid monitoring  4,5. Hypertensive retinopathy OU  - discussed importance of tight BP control  - monitor  6. Mixed form age related cataracts OU  - The symptoms of cataract, surgical options, and treatments and risks were discussed with patient.  - discussed diagnosis and progression  - not yet visually significant  - monitor for now  7. Glaucoma Suspect OU  - IOP 18 OU today  - under the expert management of Dr. Kathlen Mody   Ophthalmic Meds Ordered this visit:  Meds ordered this encounter  Medications  . Bevacizumab (AVASTIN) SOLN 1.25 mg   Return in about 4 weeks (around 08/14/2020) for f/u exu ARMD OD, DFE,  OCT.  There are no Patient Instructions on file for this visit.  This document serves as a record of services personally performed by Gardiner Sleeper, MD, PhD. It was created on their behalf by Leeann Must, [place], an ophthalmic technician. The creation of this record is the provider's dictation and/or activities during the visit.    Electronically signed by: Leeann Must, [place] 09.09.2021 4:00 PM  This document serves as a record of services personally performed by Gardiner Sleeper, MD, PhD. It was created on their behalf by San Jetty. Owens Shark, OA an ophthalmic technician. The creation of this record is the provider's dictation and/or activities during the visit.    Electronically signed by: San Jetty. Owens Shark, [place] 09.13.2021 4:00 PM  Gardiner Sleeper, M.D., Ph.D. Diseases & Surgery of the Retina and [place] 07/17/2020   I have reviewed the above documentation for accuracy and completeness, and I agree with the above. Gardiner Sleeper, M.D., Ph.D. 07/17/20 4:00 PM   Abbreviations: M myopia (nearsighted); A astigmatism; H hyperopia (farsighted); P presbyopia; Mrx spectacle prescription;  CTL contact lenses; OD right eye; OS left eye; OU both eyes  XT exotropia; ET esotropia; PEK punctate epithelial keratitis; PEE punctate epithelial erosions; DES dry eye syndrome; MGD meibomian gland dysfunction; ATs artificial tears; PFAT's preservative free artificial tears;  [place] nuclear sclerotic cataract; PSC posterior subcapsular cataract; ERM epi-retinal membrane; PVD posterior vitreous detachment; RD retinal detachment; DM diabetes mellitus; DR diabetic retinopathy; NPDR non-proliferative diabetic retinopathy; PDR proliferative diabetic retinopathy; CSME clinically significant macular edema; DME diabetic macular edema; dbh dot blot hemorrhages; CWS cotton wool spot; POAG primary open angle glaucoma; C/D cup-to-disc ratio; HVF humphrey visual field; GVF goldmann visual field; OCT  optical coherence tomography; IOP intraocular pressure; BRVO Branch retinal vein occlusion; CRVO central retinal vein occlusion; CRAO central retinal artery occlusion; BRAO branch retinal artery occlusion; RT retinal tear; SB scleral buckle; PPV pars plana vitrectomy; VH Vitreous hemorrhage; PRP panretinal laser photocoagulation; IVK intravitreal kenalog; VMT vitreomacular traction; MH Macular hole;  NVD neovascularization of the disc; NVE neovascularization elsewhere; AREDS age related eye disease study; ARMD age related macular degeneration; POAG primary open angle glaucoma; EBMD epithelial/anterior basement membrane dystrophy; ACIOL anterior chamber intraocular lens; IOL intraocular lens; PCIOL posterior chamber intraocular lens; Phaco/IOL phacoemulsification with intraocular lens placement; [place] photorefractive keratectomy; LASIK laser assisted in situ keratomileusis; HTN hypertension; DM diabetes mellitus; COPD chronic obstructive pulmonary disease

## 2020-07-17 ENCOUNTER — Other Ambulatory Visit: Payer: Self-pay

## 2020-07-17 ENCOUNTER — Ambulatory Visit (INDEPENDENT_AMBULATORY_CARE_PROVIDER_SITE_OTHER): Payer: Medicare Other | Admitting: Ophthalmology

## 2020-07-17 ENCOUNTER — Encounter (INDEPENDENT_AMBULATORY_CARE_PROVIDER_SITE_OTHER): Payer: Self-pay | Admitting: Ophthalmology

## 2020-07-17 DIAGNOSIS — H40003 Preglaucoma, unspecified, bilateral: Secondary | ICD-10-CM | POA: Diagnosis not present

## 2020-07-17 DIAGNOSIS — H353211 Exudative age-related macular degeneration, right eye, with active choroidal neovascularization: Secondary | ICD-10-CM

## 2020-07-17 DIAGNOSIS — H35033 Hypertensive retinopathy, bilateral: Secondary | ICD-10-CM

## 2020-07-17 DIAGNOSIS — H3581 Retinal edema: Secondary | ICD-10-CM | POA: Diagnosis not present

## 2020-07-17 DIAGNOSIS — H25813 Combined forms of age-related cataract, bilateral: Secondary | ICD-10-CM

## 2020-07-17 DIAGNOSIS — H353122 Nonexudative age-related macular degeneration, left eye, intermediate dry stage: Secondary | ICD-10-CM

## 2020-07-17 DIAGNOSIS — I10 Essential (primary) hypertension: Secondary | ICD-10-CM | POA: Diagnosis not present

## 2020-07-17 MED ORDER — BEVACIZUMAB CHEMO INJECTION 1.25MG/0.05ML SYRINGE FOR KALEIDOSCOPE
1.2500 mg | INTRAVITREAL | Status: AC | PRN
  Administered 2020-07-17: 1.25 mg via INTRAVITREAL

## 2020-08-01 ENCOUNTER — Other Ambulatory Visit: Payer: Self-pay

## 2020-08-01 ENCOUNTER — Ambulatory Visit (INDEPENDENT_AMBULATORY_CARE_PROVIDER_SITE_OTHER): Payer: Medicare Other | Admitting: Family Medicine

## 2020-08-01 VITALS — BP 110/70 | HR 70 | Temp 97.7°F | Ht 59.0 in | Wt 232.0 lb

## 2020-08-01 DIAGNOSIS — I10 Essential (primary) hypertension: Secondary | ICD-10-CM

## 2020-08-01 DIAGNOSIS — L509 Urticaria, unspecified: Secondary | ICD-10-CM

## 2020-08-01 DIAGNOSIS — Z23 Encounter for immunization: Secondary | ICD-10-CM

## 2020-08-01 MED ORDER — LEVOCETIRIZINE DIHYDROCHLORIDE 5 MG PO TABS: 5.0000 mg | ORAL_TABLET | Freq: Every evening | ORAL | 1 refills | Status: DC

## 2020-08-01 MED ORDER — TELMISARTAN-HCTZ 80-12.5 MG PO TABS: 1.0000 | ORAL_TABLET | Freq: Every day | ORAL | 3 refills | Status: DC

## 2020-08-01 MED ORDER — OXYCODONE-ACETAMINOPHEN 10-325 MG PO TABS: 1.0000 | ORAL_TABLET | ORAL | 0 refills | Status: DC | PRN

## 2020-08-01 MED ORDER — ZOLPIDEM TARTRATE 10 MG PO TABS: 10.0000 mg | ORAL_TABLET | Freq: Every evening | ORAL | 5 refills | Status: DC | PRN

## 2020-08-01 NOTE — Progress Notes (Signed)
Subjective:    Patient ID: Faith Reeves, female    DOB: Dec 15, 1950, 69 y.o.   MRN: [id number]  HPI Patient reports a rash all over her body.  She states that is been coming and going for several weeks.  The rash are urticarial-like plaques and papules.  They tend to be on the flexor surfaces of the arms and the forearms.  She also occasionally gets similar bumps on her legs.  They itch slightly however she finds herself scratching them frequently.  There is no scale.  The borders are poorly circumscribed.  They do appear urticarial in nature.  They come and go.  She denies any new medications or foods or ingestions.  She denies any new creams or soaps or lotions or perfumes.  There do not appear to be lesions on her back.  She does have some lesions on her left breast.  I suspect chronic urticaria  Past Medical History:  Diagnosis Date  . Allergy   . Anemia   . Anxiety   . Blood transfusion without reported diagnosis   . Cataract   . GERD (gastroesophageal reflux disease)   . Hypertension   . Hypertensive retinopathy    OU  . Macular degeneration    Past Surgical History:  Procedure Laterality Date  . ABDOMINAL HYSTERECTOMY    . ACHILLES TENDON REPAIR Bilateral   . APPENDECTOMY    . BACK SURGERY    . BILATERAL CARPAL TUNNEL RELEASE    . CHOLECYSTECTOMY    . COLONOSCOPY    . HERNIA REPAIR    . JOINT REPLACEMENT    . MELANOMA EXCISION Right 09/15/2017   Procedure: EXCISION LIPOSARCOMA RIGHT ARM;  Surgeon: Georganna Skeans, MD;  Location: [facility];  Service: General;  Laterality: Right;  . SPINE SURGERY     Current Outpatient Medications on File Prior to Visit  Medication Sig Dispense Refill  . albuterol (PROVENTIL HFA;VENTOLIN HFA) 108 (90 Base) MCG/ACT inhaler Inhale 2 puffs into the lungs every 4 (four) hours as needed for wheezing or shortness of breath. 1 Inhaler 0  . amoxicillin (AMOXIL) 500 MG capsule Take 2,000 mg by mouth once. Prior to dental work    . celecoxib (CELEBREX)  200 MG capsule TAKE 1 CAPSULE BY MOUTH EVERY DAY 90 capsule 3  . gabapentin (NEURONTIN) 300 MG capsule TAKE 1 CAPSULE BY MOUTH THREE TIMES A DAY 90 capsule 1  . ipratropium (ATROVENT) 0.06 % nasal spray SPRAY 2 SPRAYS INTO EACH NOSTRIL 4 TIMES A DAY 15 mL 5  . mometasone (ELOCON) 0.1 % cream Apply 1 application topically daily as needed. 50 g 3  . oxyCODONE-acetaminophen (PERCOCET) 10-325 MG tablet Take 1 tablet by mouth every 4 (four) hours as needed for pain (G89.4). 90 tablet 0  . pantoprazole (PROTONIX) 40 MG tablet TAKE 1 TABLET BY MOUTH EVERY DAY 90 tablet 3  . sertraline (ZOLOFT) 50 MG tablet Take 1.5 tablets (75 mg total) by mouth daily. 135 tablet 3  . telmisartan-hydrochlorothiazide (MICARDIS HCT) 80-12.5 MG tablet TAKE 1 TABLET BY MOUTH EVERY DAY 90 tablet 3  . valACYclovir (VALTREX) 1000 MG tablet Take 2 tablets (2,000 mg total) by mouth 2 (two) times daily. 4 tablet 2  . zolpidem (AMBIEN) 10 MG tablet Take 1 tablet (10 mg total) by mouth at bedtime as needed for sleep. 30 tablet 5   Current Facility-Administered Medications on File Prior to Visit  Medication Dose Route Frequency Provider Last Rate Last Admin  . Bevacizumab (AVASTIN) SOLN  1.25 mg  1.25 mg Intravitreal  Bernarda Caffey, MD   1.25 mg at 01/12/19 1629   Allergies  Allergen Reactions  . Cymbalta [Duloxetine Hcl] Swelling   Social History   Socioeconomic History  . Marital status: Married    Spouse name: Not on file  . Number of children: Not on file  . Years of education: Not on file  . Highest education level: Not on file  Occupational History  . Not on file  Tobacco Use  . Smoking status: Never Smoker  . Smokeless tobacco: Never Used  Vaping Use  . Vaping Use: Never used  Substance and Sexual Activity  . Alcohol use: Yes    Alcohol/week: 1.0 standard drink    Types: 1 Glasses of wine per week  . Drug use: No  . Sexual activity: Never  Other Topics Concern  . Not on file  Social History Narrative  .  Not on file   Social Determinants of Health   Financial Resource Strain:   . Difficulty of Paying Living Expenses: Not on file  Food Insecurity:   . Worried About Charity fundraiser in the Last Year: Not on file  . Ran Out of Food in the Last Year: Not on file  Transportation Needs:   . Lack of Transportation (Medical): Not on file  . Lack of Transportation (Non-Medical): Not on file  Physical Activity:   . Days of Exercise per Week: Not on file  . Minutes of Exercise per Session: Not on file  Stress:   . Feeling of Stress : Not on file  Social Connections:   . Frequency of Communication with Friends and Family: Not on file  . Frequency of Social Gatherings with Friends and Family: Not on file  . Attends Religious Services: Not on file  . Active Member of Clubs or Organizations: Not on file  . Attends Archivist Meetings: Not on file  . Marital Status: Not on file  Intimate Partner Violence:   . Fear of Current or Ex-Partner: Not on file  . Emotionally Abused: Not on file  . Physically Abused: Not on file  . Sexually Abused: Not on file     Review of Systems  Skin: Positive for rash.  All other systems reviewed and are negative.      Objective:   Physical Exam Constitutional:      Appearance: She is obese.  Cardiovascular:     Rate and Rhythm: Normal rate and regular rhythm.     Heart sounds: Normal heart sounds.  Pulmonary:     Effort: Pulmonary effort is normal.     Breath sounds: Normal breath sounds.  Skin:    Findings: Rash present. No bruising, ecchymosis, erythema or petechiae. Rash is papular and urticarial. Rash is not crusting, macular, nodular, purpuric, pustular, scaling or vesicular.  Neurological:     Mental Status: She is alert.           Assessment & Plan:  Benign essential HTN - Plan: CBC with Differential/Platelet, COMPLETE METABOLIC PANEL WITH GFR, Lipid panel  Need for immunization against influenza - Plan: Flu Vaccine QUAD  High Dose(Fluad)  Urticaria  I suspect chronic urticaria related to stress.  Try Xyzal 5 mg a day to see if the rash will improve.  Patient's blood pressure today is excellent 110/70.  While she is here I would like to check a CBC, check a CMP to monitor her chronic kidney disease, and check a fasting lipid  panel.  Patient received her flu shot today.  Reassess in 1 month or sooner if worsening.

## 2020-08-02 LAB — COMPLETE METABOLIC PANEL WITH GFR
AG Ratio: 1.2 (calc) (ref 1.0–2.5)
ALT: 13 U/L (ref 6–29)
AST: 17 U/L (ref 10–35)
Albumin: 3.7 g/dL (ref 3.6–5.1)
Alkaline phosphatase (APISO): 104 U/L (ref 37–153)
BUN/Creatinine Ratio: 17 (calc) (ref 6–22)
BUN: 22 mg/dL (ref 7–25)
CO2: 29 mmol/L (ref 20–32)
Calcium: 9.4 mg/dL (ref 8.6–10.4)
Chloride: 102 mmol/L (ref 98–110)
Creat: 1.29 mg/dL — ABNORMAL HIGH (ref 0.60–0.93)
GFR, Est African American: 48 mL/min/{1.73_m2} — ABNORMAL LOW (ref >=60)
GFR, Est Non African American: 42 mL/min/{1.73_m2} — ABNORMAL LOW (ref >=60)
Globulin: 3.1 g/dL (calc) (ref 1.9–3.7)
Glucose, Bld: 79 mg/dL (ref 65–99)
Potassium: 3.9 mmol/L (ref 3.5–5.3)
Sodium: 139 mmol/L (ref 135–146)
Total Bilirubin: 0.5 mg/dL (ref 0.2–1.2)
Total Protein: 6.8 g/dL (ref 6.1–8.1)

## 2020-08-02 LAB — CBC WITH DIFFERENTIAL/PLATELET
Absolute Monocytes: 354 cells/uL (ref 200–950)
Basophils Absolute: 42 cells/uL (ref 0–200)
Basophils Relative: 0.8 %
Eosinophils Absolute: 182 cells/uL (ref 15–500)
Eosinophils Relative: 3.5 %
HCT: 45 % (ref 35.0–45.0)
Hemoglobin: 14.8 g/dL (ref 11.7–15.5)
Lymphs Abs: 1212 cells/uL (ref 850–3900)
MCH: 28.9 pg (ref 27.0–33.0)
MCHC: 32.9 g/dL (ref 32.0–36.0)
MCV: 87.9 fL (ref 80.0–100.0)
MPV: 11.7 fL (ref 7.5–12.5)
Monocytes Relative: 6.8 %
Neutro Abs: 3411 cells/uL (ref 1500–7800)
Neutrophils Relative %: 65.6 %
Platelets: 208 10*3/uL (ref 140–400)
RBC: 5.12 10*6/uL — ABNORMAL HIGH (ref 3.80–5.10)
RDW: 13.5 % (ref 11.0–15.0)
Total Lymphocyte: 23.3 %
WBC: 5.2 10*3/uL (ref 3.8–10.8)

## 2020-08-02 LAB — LIPID PANEL
Cholesterol: 137 mg/dL (ref <200)
HDL: 53 mg/dL (ref >=50)
LDL Cholesterol (Calc): 70 mg/dL (calc)
Non-HDL Cholesterol (Calc): 84 mg/dL (calc) (ref <130)
Total CHOL/HDL Ratio: 2.6 (calc) (ref <5.0)
Triglycerides: 65 mg/dL (ref <150)

## 2020-08-10 NOTE — Progress Notes (Signed)
[facility] Note  08/14/2020   CHIEF COMPLAINT Patient presents for Retina Follow Up  HISTORY OF PRESENT ILLNESS: Faith Reeves is a 69 y.o. female who presents to the clinic today for:   HPI    Retina Follow Up    Patient presents with  Wet AMD.  In right eye.  This started weeks ago.  Severity is moderate.  Duration of weeks.  Since onset it is stable.  I, the attending physician,  performed the HPI with the patient and updated documentation appropriately.          Comments    Pt states her vision is the same OU.  Pt does not have eye pain but complains of discomfort from foreign body sensation OD only.         Last edited by Bernarda Caffey, MD on 08/14/2020  1:47 PM. (History)    pt feels   Referring physician: Susy Frizzle, MD [id number] [facility] [place],  [place] [address]  HISTORICAL INFORMATION:   Selected notes from the MEDICAL RECORD NUMBER Referred by Dr. Quentin Ore for concern of exu ARMD OU    CURRENT MEDICATIONS: No current outpatient medications on file. (Ophthalmic Drugs)   No current facility-administered medications for this visit. (Ophthalmic Drugs)   Current Outpatient Medications (Other)  Medication Sig  . albuterol (PROVENTIL HFA;VENTOLIN HFA) 108 (90 Base) MCG/ACT inhaler Inhale 2 puffs into the lungs every 4 (four) hours as needed for wheezing or shortness of breath.  Marland Kitchen amoxicillin (AMOXIL) 500 MG capsule Take 2,000 mg by mouth once. Prior to dental work  . celecoxib (CELEBREX) 200 MG capsule TAKE 1 CAPSULE BY MOUTH EVERY DAY  . gabapentin (NEURONTIN) 300 MG capsule TAKE 1 CAPSULE BY MOUTH THREE TIMES A DAY  . ipratropium (ATROVENT) 0.06 % nasal spray SPRAY 2 SPRAYS INTO EACH NOSTRIL 4 TIMES A DAY  . levocetirizine (XYZAL) 5 MG tablet Take 1 tablet (5 mg total) by mouth every evening.  . mometasone (ELOCON) 0.1 % cream Apply 1 application topically daily as needed.  Marland Kitchen oxyCODONE-acetaminophen (PERCOCET) 10-325 MG  tablet Take 1 tablet by mouth every 4 (four) hours as needed for pain (G89.4).  . pantoprazole (PROTONIX) 40 MG tablet TAKE 1 TABLET BY MOUTH EVERY DAY  . sertraline (ZOLOFT) 50 MG tablet Take 1.5 tablets (75 mg total) by mouth daily.  Marland Kitchen telmisartan-hydrochlorothiazide (MICARDIS HCT) 80-12.5 MG tablet Take 1 tablet by mouth daily.  . valACYclovir (VALTREX) 1000 MG tablet Take 2 tablets (2,000 mg total) by mouth 2 (two) times daily.  Marland Kitchen zolpidem (AMBIEN) 10 MG tablet Take 1 tablet (10 mg total) by mouth at bedtime as needed for sleep.   Current Facility-Administered Medications (Other)  Medication Route  . Bevacizumab (AVASTIN) SOLN 1.25 mg Intravitreal      REVIEW OF SYSTEMS: ROS    Positive for: Eyes   Negative for: Constitutional, Gastrointestinal, Neurological, Skin, Genitourinary, Musculoskeletal, HENT, Endocrine, Cardiovascular, Respiratory, Psychiatric, Allergic/Imm, Heme/Lymph   Last edited by Doneen Poisson on 08/14/2020  1:10 PM. (History)       ALLERGIES Allergies  Allergen Reactions  . Cymbalta [Duloxetine Hcl] Swelling    PAST MEDICAL HISTORY Past Medical History:  Diagnosis Date  . Allergy   . Anemia   . Anxiety   . Blood transfusion without reported diagnosis   . Cataract   . GERD (gastroesophageal reflux disease)   . Hypertension   . Hypertensive retinopathy    OU  .  Macular degeneration    Past Surgical History:  Procedure Laterality Date  . ABDOMINAL HYSTERECTOMY    . ACHILLES TENDON REPAIR Bilateral   . APPENDECTOMY    . BACK SURGERY    . BILATERAL CARPAL TUNNEL RELEASE    . CHOLECYSTECTOMY    . COLONOSCOPY    . HERNIA REPAIR    . JOINT REPLACEMENT    . MELANOMA EXCISION Right 09/15/2017   Procedure: EXCISION LIPOSARCOMA RIGHT ARM;  Surgeon: Georganna Skeans, MD;  Location: [place];  Service: General;  Laterality: Right;  . SPINE SURGERY      FAMILY HISTORY Family History  Problem Relation Age of Onset  . Hypertension Mother   . Cancer  Sister   . Stroke Maternal Grandmother     SOCIAL HISTORY Social History   Tobacco Use  . Smoking status: Never Smoker  . Smokeless tobacco: Never Used  Vaping Use  . Vaping Use: Never used  Substance Use Topics  . Alcohol use: Yes    Alcohol/week: 1.0 standard drink    Types: 1 Glasses of wine per week  . Drug use: No         OPHTHALMIC EXAM:  Base Eye Exam    Visual Acuity (Snellen - Linear)      Right Left   Dist cc 20/40 -1 20/20 -2   Dist ph cc 20/30    Correction: Glasses       Tonometry (Tonopen, 1:15 PM)      Right Left   Pressure 13 15       Pupils      Dark Light Shape React APD   Right 3 2 Round Brisk 0   Left 3 2 Round Brisk 0       Visual Fields      Left Right    Full Full       Extraocular Movement      Right Left    Full Full       Neuro/Psych    Oriented x3: Yes   Mood/Affect: Normal       Dilation    Both eyes: 1.0% Mydriacyl, 2.5% Phenylephrine @ 1:15 PM        Slit Lamp and Fundus Exam    Slit Lamp Exam      Right Left   Lids/Lashes Dermatochalasis - upper lid, mild Meibomian gland dysfunction Dermatochalasis - upper lid, mild Meibomian gland dysfunction   Conjunctiva/Sclera White and quiet White and quiet   Cornea 2+ diffuse Punctate epithelial erosions 2+ diffuse Punctate epithelial erosions   Anterior Chamber deep and clear deep and clear   Iris Round and dilated Round and dilated   Lens 2-3+ Nuclear sclerosis, 2-3+ Cortical cataract 2+ Nuclear sclerosis, 2+ Cortical cataract   Vitreous Vitreous syneresis, Posterior vitreous detachment Vitreous syneresis, Posterior vitreous detachment       Fundus Exam      Right Left   Disc Pink and Sharp, Compact Pink and Sharp, temporal PPP   C/D Ratio 0.2 0.1   Macula Blunted foveal reflex, drusen, RPE mottling, clumping and atrophy, +CNV, interval improvement in central SRF  Flat, Blunted foveal reflex, scattered Drusen, focal PEDs   Vessels Vascular attenuation, Tortuous  Vascular attenuation, mild tortuousity   Periphery Attached, mild reticular degeneration no heme Attached, no heme        Refraction    Wearing Rx      Sphere Cylinder Axis Add   Right +2.00 +0.50 008 +2.50   Left +  2.50 +1.00 154 +2.50   Type: PAL          IMAGING AND PROCEDURES  Imaging and Procedures for @TODAY @  OCT, Retina - OU - Both Eyes       Right Eye Quality was good. Central Foveal Thickness: 315. Progression has improved. Findings include no IRF, retinal drusen , pigment epithelial detachment, outer retinal atrophy, normal foveal contour, subretinal fluid (Mild Interval improvement in SRF).   Left Eye Quality was good. Central Foveal Thickness: 267. Progression has been stable. Findings include normal foveal contour, no SRF, no IRF, retinal drusen .   Notes *Images captured and stored on drive  Diagnosis / Impression:  OD: exu ARMD -- Mild Interval improvement in SRF OS: non-exu ARMD -- stable   Clinical management:  See below  Abbreviations: NFP - Normal foveal profile. CME - cystoid macular edema. PED - pigment epithelial detachment. IRF - intraretinal fluid. SRF - subretinal fluid. EZ - ellipsoid zone. ERM - epiretinal membrane. ORA - outer retinal atrophy. ORT - outer retinal tubulation. SRHM - subretinal hyper-reflective material        Intravitreal Injection, Pharmacologic Agent - OD - Right Eye       Time Out 08/14/2020. 1:28 PM. Confirmed correct patient, procedure, site, and patient consented.   Anesthesia Topical anesthesia was used. Anesthetic medications included Lidocaine 2%, Proparacaine 0.5%.   Procedure Preparation included 5% betadine to ocular surface, eyelid speculum. A supplied needle was used.   Injection:  1.25 mg Bevacizumab (AVASTIN) SOLN   NDC: 16109-604-54, Lot: [id number] , Expiration date: 09/21/2020   Route: Intravitreal, Site: Right Eye, Waste: 0 mL  Post-op Post injection exam found visual acuity of at least  counting fingers. The patient tolerated the procedure well. There were no complications. The patient received written and verbal post procedure care education. Post injection medications were not given.                 ASSESSMENT/PLAN:    ICD-10-CM   1. Exudative age-related macular degeneration of right eye with active choroidal neovascularization (HCC)  H35.3211 Intravitreal Injection, Pharmacologic Agent - OD - Right Eye    Bevacizumab (AVASTIN) SOLN 1.25 mg  2. Retinal edema  H35.81 OCT, Retina - OU - Both Eyes  3. Intermediate stage nonexudative age-related macular degeneration of left eye  H35.3122   4. Essential hypertension  I10   5. Hypertensive retinopathy of both eyes  H35.033   6. Combined forms of age-related cataract of both eyes  H25.813   7. Glaucoma suspect of both eyes  H40.003     1,2. Exudative age related macular degeneration, right eye  - FA (03.10.20) shows +CNV w/ staining/leakage  - s/p IVA OD #1 (03.10.20), #2 (04.13.20), #3 (05.11.20), #4 (06.23.20), #5 (8.18.20), #6 (10.27.20), #7 (01.26.21), #8 (04.26.21), #9 (08.02.21), #10 (09.13.21)  - OCT today shows mild interval decrease in SRF at 4 wks -- ?CSCR-like SRF collection  - BCVA stable at 20/30   - FA 9.13.21 shows interval increase in perifoveal leakage OD -- no classic CSCR findings  - recommend IVA OD #11 today, 10.11.21  - pt was doing very well with q3 mo maintenance injections, but now with interval increase in central SRF with last two visits -- IVA resistance vs interval too long  - pt wishes to be treated with IVA OD - consider switching medications at next visit  - RBA of procedure discussed, questions answered  - informed consent obtained and signed  - see procedure  note  - f/u in 4-5 wks -- DFE/OCT/possible injection  3. Age related macular degeneration, non-exudative, left eye  - The incidence, anatomy, and pathology of dry AMD, risk of progression, and the AREDS and AREDS 2 study  including smoking risks discussed with patient.  - intermediate stage  - recommend amsler grid monitoring  4,5. Hypertensive retinopathy OU  - discussed importance of tight BP control  - monitor  6. Mixed form age related cataracts OU  - The symptoms of cataract, surgical options, and treatments and risks were discussed with patient.  - discussed diagnosis and progression  - not yet visually significant  - monitor for now  7. Glaucoma Suspect OU  - IOP 13,15 today  - under the expert management of Dr. Kathlen Mody   Ophthalmic Meds Ordered this visit:  Meds ordered this encounter  Medications  . Bevacizumab (AVASTIN) SOLN 1.25 mg   Return for f/u 4-5 weeks, exu ARMD OD, DFE, OCT.  There are no Patient Instructions on file for this visit.  This document serves as a record of services personally performed by Gardiner Sleeper, MD, PhD. It was created on their behalf by Leeann Must, [place], an ophthalmic technician. The creation of this record is the provider's dictation and/or activities during the visit.    Electronically signed by: Leeann Must, COA 10.07.2021 1:51 PM   This document serves as a record of services personally performed by Gardiner Sleeper, MD, PhD. It was created on their behalf by San Jetty. Owens Shark, OA an ophthalmic technician. The creation of this record is the provider's dictation and/or activities during the visit.    Electronically signed by: San Jetty. Owens Shark, [place] 10.11.2021 1:51 PM  Gardiner Sleeper, M.D., Ph.D. Diseases & Surgery of the Retina and [place] 08/14/2020   I have reviewed the above documentation for accuracy and completeness, and I agree with the above. Gardiner Sleeper, M.D., Ph.D. 08/14/20 1:51 PM  Abbreviations: M myopia (nearsighted); A astigmatism; H hyperopia (farsighted); P presbyopia; Mrx spectacle prescription;  CTL contact lenses; OD right eye; OS left eye; OU both eyes  XT exotropia; ET esotropia; PEK  punctate epithelial keratitis; PEE punctate epithelial erosions; DES dry eye syndrome; MGD meibomian gland dysfunction; ATs artificial tears; PFAT's preservative free artificial tears; [place] nuclear sclerotic cataract; PSC posterior subcapsular cataract; ERM epi-retinal membrane; PVD posterior vitreous detachment; RD retinal detachment; DM diabetes mellitus; DR diabetic retinopathy; NPDR non-proliferative diabetic retinopathy; PDR proliferative diabetic retinopathy; CSME clinically significant macular edema; DME diabetic macular edema; dbh dot blot hemorrhages; CWS cotton wool spot; POAG primary open angle glaucoma; C/D cup-to-disc ratio; HVF humphrey visual field; GVF goldmann visual field; OCT optical coherence tomography; IOP intraocular pressure; BRVO Branch retinal vein occlusion; CRVO central retinal vein occlusion; CRAO central retinal artery occlusion; BRAO branch retinal artery occlusion; RT retinal tear; SB scleral buckle; PPV pars plana vitrectomy; VH Vitreous hemorrhage; PRP panretinal laser photocoagulation; IVK intravitreal kenalog; VMT vitreomacular traction; MH Macular hole;  NVD neovascularization of the disc; NVE neovascularization elsewhere; AREDS age related eye disease study; ARMD age related macular degeneration; POAG primary open angle glaucoma; EBMD epithelial/anterior basement membrane dystrophy; ACIOL anterior chamber intraocular lens; IOL intraocular lens; PCIOL posterior chamber intraocular lens; Phaco/IOL phacoemulsification with intraocular lens placement; [place] photorefractive keratectomy; LASIK laser assisted in situ keratomileusis; HTN hypertension; DM diabetes mellitus; COPD chronic obstructive pulmonary disease

## 2020-08-14 ENCOUNTER — Encounter (INDEPENDENT_AMBULATORY_CARE_PROVIDER_SITE_OTHER): Payer: Self-pay | Admitting: Ophthalmology

## 2020-08-14 ENCOUNTER — Ambulatory Visit (INDEPENDENT_AMBULATORY_CARE_PROVIDER_SITE_OTHER): Payer: Medicare Other | Admitting: Ophthalmology

## 2020-08-14 ENCOUNTER — Other Ambulatory Visit: Payer: Self-pay

## 2020-08-14 DIAGNOSIS — I10 Essential (primary) hypertension: Secondary | ICD-10-CM | POA: Diagnosis not present

## 2020-08-14 DIAGNOSIS — H25813 Combined forms of age-related cataract, bilateral: Secondary | ICD-10-CM

## 2020-08-14 DIAGNOSIS — H3581 Retinal edema: Secondary | ICD-10-CM | POA: Diagnosis not present

## 2020-08-14 DIAGNOSIS — H35033 Hypertensive retinopathy, bilateral: Secondary | ICD-10-CM | POA: Diagnosis not present

## 2020-08-14 DIAGNOSIS — H353122 Nonexudative age-related macular degeneration, left eye, intermediate dry stage: Secondary | ICD-10-CM

## 2020-08-14 DIAGNOSIS — H353211 Exudative age-related macular degeneration, right eye, with active choroidal neovascularization: Secondary | ICD-10-CM

## 2020-08-14 DIAGNOSIS — H40003 Preglaucoma, unspecified, bilateral: Secondary | ICD-10-CM

## 2020-08-14 MED ORDER — BEVACIZUMAB CHEMO INJECTION 1.25MG/0.05ML SYRINGE FOR KALEIDOSCOPE
1.2500 mg | INTRAVITREAL | Status: AC | PRN
  Administered 2020-08-14: 1.25 mg via INTRAVITREAL

## 2020-08-24 ENCOUNTER — Other Ambulatory Visit: Payer: Self-pay | Admitting: Family Medicine

## 2020-09-08 NOTE — Progress Notes (Signed)
[facility] Note  09/12/2020   CHIEF COMPLAINT Patient presents for Retina Follow Up  HISTORY OF PRESENT ILLNESS: Faith Reeves is a 69 y.o. female who presents to the clinic today for:   HPI    Retina Follow Up    Patient presents with  Wet AMD.  In right eye.  This started 4 weeks ago.  I, the attending physician,  performed the HPI with the patient and updated documentation appropriately.          Comments    Patient here for 4 weeks retina follow up for exu ARMD OD. Patient states vision doing good. No difference. No eye pain.        Last edited by Bernarda Caffey, MD on 09/12/2020  8:02 PM. (History)    Pt has not noticed a big change in her [place] since last visit.  Referring physician: Susy Frizzle, MD [address] [facility] [place],  [place] [id number]  HISTORICAL INFORMATION:   Selected notes from the MEDICAL RECORD NUMBER Referred by Dr. Quentin Ore for concern of exu ARMD OU   CURRENT MEDICATIONS: No current outpatient medications on file. (Ophthalmic Drugs)   No current facility-administered medications for this visit. (Ophthalmic Drugs)   Current Outpatient Medications (Other)  Medication Sig  . albuterol (PROVENTIL HFA;VENTOLIN HFA) 108 (90 Base) MCG/ACT inhaler Inhale 2 puffs into the lungs every 4 (four) hours as needed for wheezing or shortness of breath.  Marland Kitchen amoxicillin (AMOXIL) 500 MG capsule Take 2,000 mg by mouth once. Prior to dental work  . celecoxib (CELEBREX) 200 MG capsule TAKE 1 CAPSULE BY MOUTH EVERY DAY  . gabapentin (NEURONTIN) 300 MG capsule TAKE 1 CAPSULE BY MOUTH THREE TIMES A DAY  . ipratropium (ATROVENT) 0.06 % nasal spray SPRAY 2 SPRAYS INTO EACH NOSTRIL 4 TIMES A DAY  . levocetirizine (XYZAL) 5 MG tablet TAKE 1 TABLET BY MOUTH EVERY DAY IN THE EVENING  . mometasone (ELOCON) 0.1 % cream Apply 1 application topically daily as needed.  Marland Kitchen oxyCODONE-acetaminophen (PERCOCET) 10-325 MG tablet Take 1 tablet by mouth  every 4 (four) hours as needed for pain (G89.4).  . pantoprazole (PROTONIX) 40 MG tablet TAKE 1 TABLET BY MOUTH EVERY DAY  . sertraline (ZOLOFT) 50 MG tablet Take 1.5 tablets (75 mg total) by mouth daily.  Marland Kitchen telmisartan-hydrochlorothiazide (MICARDIS HCT) 80-12.5 MG tablet Take 1 tablet by mouth daily.  . valACYclovir (VALTREX) 1000 MG tablet Take 2 tablets (2,000 mg total) by mouth 2 (two) times daily.  Marland Kitchen zolpidem (AMBIEN) 10 MG tablet Take 1 tablet (10 mg total) by mouth at bedtime as needed for sleep.   Current Facility-Administered Medications (Other)  Medication Route  . Bevacizumab (AVASTIN) SOLN 1.25 mg Intravitreal      REVIEW OF SYSTEMS: ROS    Positive for: Eyes   Negative for: Constitutional, Gastrointestinal, Neurological, Skin, Genitourinary, Musculoskeletal, HENT, Endocrine, Cardiovascular, Respiratory, Psychiatric, Allergic/Imm, Heme/Lymph   Last edited by Theodore Demark, COA on 09/12/2020  1:54 PM. (History)       ALLERGIES Allergies  Allergen Reactions  . Cymbalta [Duloxetine Hcl] Swelling    PAST MEDICAL HISTORY Past Medical History:  Diagnosis Date  . Allergy   . Anemia   . Anxiety   . Blood transfusion without reported diagnosis   . Cataract   . GERD (gastroesophageal reflux disease)   . Hypertension   . Hypertensive retinopathy    OU  . Macular degeneration  Past Surgical History:  Procedure Laterality Date  . ABDOMINAL HYSTERECTOMY    . ACHILLES TENDON REPAIR Bilateral   . APPENDECTOMY    . BACK SURGERY    . BILATERAL CARPAL TUNNEL RELEASE    . CHOLECYSTECTOMY    . COLONOSCOPY    . HERNIA REPAIR    . JOINT REPLACEMENT    . MELANOMA EXCISION Right 09/15/2017   Procedure: EXCISION LIPOSARCOMA RIGHT ARM;  Surgeon: Georganna Skeans, MD;  Location: [facility];  Service: General;  Laterality: Right;  . SPINE SURGERY      FAMILY HISTORY Family History  Problem Relation Age of Onset  . Hypertension Mother   . Cancer Sister   . Stroke Maternal  Grandmother     SOCIAL HISTORY Social History   Tobacco Use  . Smoking status: Never Smoker  . Smokeless tobacco: Never Used  Vaping Use  . Vaping Use: Never used  Substance Use Topics  . Alcohol use: Yes    Alcohol/week: 1.0 standard drink    Types: 1 Glasses of wine per week  . Drug use: No         OPHTHALMIC EXAM:  Base Eye Exam    Visual Acuity (Snellen - Linear)      Right Left   Dist cc 20/40 +1 20/20   Dist ph cc 20/25 -2    Correction: Glasses       Tonometry (Tonopen, 1:52 PM)      Right Left   Pressure 18 17       Pupils      Dark Light Shape React APD   Right 3 2 Round Brisk None   Left 3 2 Round Brisk None       Visual Fields (Counting fingers)      Left Right    Full Full       Extraocular Movement      Right Left    Full Full       Neuro/Psych    Oriented x3: Yes   Mood/Affect: Normal       Dilation    Both eyes: 1.0% Mydriacyl, 2.5% Phenylephrine @ 1:51 PM        Slit Lamp and Fundus Exam    Slit Lamp Exam      Right Left   Lids/Lashes Dermatochalasis - upper lid, mild Meibomian gland dysfunction Dermatochalasis - upper lid, mild Meibomian gland dysfunction   Conjunctiva/Sclera White and quiet White and quiet   Cornea 2+ diffuse Punctate epithelial erosions 2+ diffuse Punctate epithelial erosions   Anterior Chamber deep and clear deep and clear   Iris Round and dilated Round and dilated   Lens 2-3+ Nuclear sclerosis, 2-3+ Cortical cataract 2+ Nuclear sclerosis, 2+ Cortical cataract   Vitreous Vitreous syneresis, Posterior vitreous detachment Vitreous syneresis, Posterior vitreous detachment       Fundus Exam      Right Left   Disc Pink and Sharp, Compact Pink and Sharp, temporal PPP   C/D Ratio 0.2 0.1   Macula Blunted foveal reflex, drusen, RPE mottling, clumping and atrophy, +CNV, interval improvement in central SRF.  No heme. Flat, Blunted foveal reflex, scattered Drusen, focal PEDs   Vessels Vascular attenuation,  Tortuous Vascular attenuation, mild tortuousity   Periphery Attached, mild reticular degeneration no heme Attached, no heme        Refraction    Wearing Rx      Sphere Cylinder Axis Add   Right +2.00 +0.50 008 +2.50   Left +2.50 +  1.00 154 +2.50   Type: PAL          IMAGING AND PROCEDURES  Imaging and Procedures for @TODAY @  OCT, Retina - OU - Both Eyes       Right Eye Quality was good. Central Foveal Thickness: 291. Progression has improved. Findings include no IRF, retinal drusen , pigment epithelial detachment, outer retinal atrophy, normal foveal contour, subretinal fluid (Interval improvement in SRF).   Left Eye Quality was good. Central Foveal Thickness: 269. Progression has been stable. Findings include normal foveal contour, no SRF, no IRF, retinal drusen .   Notes *Images captured and stored on drive  Diagnosis / Impression:  OD: exu ARMD -- Interval improvement in SRF OS: non-exu ARMD -- stable   Clinical management:  See below  Abbreviations: NFP - Normal foveal profile. CME - cystoid macular edema. PED - pigment epithelial detachment. IRF - intraretinal fluid. SRF - subretinal fluid. EZ - ellipsoid zone. ERM - epiretinal membrane. ORA - outer retinal atrophy. ORT - outer retinal tubulation. SRHM - subretinal hyper-reflective material        Intravitreal Injection, Pharmacologic Agent - OD - Right Eye       Time Out 09/12/2020. 2:51 PM. Confirmed correct patient, procedure, site, and patient consented.   Anesthesia Topical anesthesia was used. Anesthetic medications included Lidocaine 2%, Proparacaine 0.5%.   Procedure Preparation included 5% betadine to ocular surface, eyelid speculum. A supplied needle was used.   Injection:  1.25 mg Bevacizumab (AVASTIN) SOLN   NDC: 16109-604-54, Lot: [id number] , Expiration date: 10/31/2020   Route: Intravitreal, Site: Right Eye, Waste: 0 mL  Post-op Post injection exam found visual acuity of at least  counting fingers. The patient tolerated the procedure well. There were no complications. The patient received written and verbal post procedure care education. Post injection medications were not given.                 ASSESSMENT/PLAN:    ICD-10-CM   1. Exudative age-related macular degeneration of right eye with active choroidal neovascularization (HCC)  H35.3211 Intravitreal Injection, Pharmacologic Agent - OD - Right Eye    Bevacizumab (AVASTIN) SOLN 1.25 mg  2. Retinal edema  H35.81 OCT, Retina - OU - Both Eyes  3. Intermediate stage nonexudative age-related macular degeneration of left eye  H35.3122   4. Essential hypertension  I10   5. Hypertensive retinopathy of both eyes  H35.033   6. Combined forms of age-related cataract of both eyes  H25.813   7. Glaucoma suspect of both eyes  H40.003     1,2. Exudative age related macular degeneration, right eye  - FA (03.10.20) shows +CNV w/ staining/leakage  - s/p IVA OD #1 (03.10.20), #2 (04.13.20), #3 (05.11.20), #4 (06.23.20), #5 (8.18.20), #6 (10.27.20), #7 (01.26.21), #8 (04.26.21), #9 (08.02.21), #10 (09.13.21), #11 (10.11.21)  - OCT today shows mild interval decrease in SRF at 4 wks -- ?CSCR-like SRF collection  - BCVA improved to 20/25 from 20/30  - FA 9.13.21 shows interval increase in perifoveal leakage OD -- no classic CSCR findings  - recommend IVA OD #12 today, 11.9.21  - pt was doing very well with q3 mo maintenance injections, but then developed interval increases in central SRF over several visits -- IVA resistance vs interval too long  - pt wishes to be treated with IVA OD - consider switching medications if SRF worsens  - RBA of procedure discussed, questions answered  - informed consent obtained and signed  - see procedure note  -  f/u in 4-5 wks -- DFE/OCT/possible injection  3. Age related macular degeneration, non-exudative, left eye  - The incidence, anatomy, and pathology of dry AMD, risk of progression, and  the AREDS and AREDS 2 study including smoking risks discussed with patient.  - intermediate stage  - recommend amsler grid monitoring  4,5. Hypertensive retinopathy OU  - discussed importance of tight BP control  - monitor  6. Mixed form age related cataracts OU  - The symptoms of cataract, surgical options, and treatments and risks were discussed with patient.  - discussed diagnosis and progression  - not yet visually significant  - monitor for now  7. Glaucoma Suspect OU  - IOP 18,17 today  - under the expert management of Dr. Kathlen Mody   Ophthalmic Meds Ordered this visit:  Meds ordered this encounter  Medications  . Bevacizumab (AVASTIN) SOLN 1.25 mg   Return for 4-5 wk f/u for exu ARMD OD w/DFE&OCT/likely inj. OD.  There are no Patient Instructions on file for this visit.  This document serves as a record of services personally performed by Gardiner Sleeper, MD, PhD. It was created on their behalf by Estill Bakes, COT an ophthalmic technician. The creation of this record is the provider's dictation and/or activities during the visit.    Electronically signed by: Estill Bakes, COT 11.5.21 @ 8:07 PM   This document serves as a record of services personally performed by Gardiner Sleeper, MD, PhD. It was created on their behalf by Estill Bakes, COT an ophthalmic technician. The creation of this record is the provider's dictation and/or activities during the visit.    Electronically signed by: Estill Bakes, COT 11.9.21 @ 8:07 PM  Gardiner Sleeper, M.D., Ph.D. Diseases & Surgery of the Retina and [place] 09/12/2020   I have reviewed the above documentation for accuracy and completeness, and I agree with the above. Gardiner Sleeper, M.D., Ph.D. 09/12/20 8:07 PM   Abbreviations: M myopia (nearsighted); A astigmatism; H hyperopia (farsighted); P presbyopia; Mrx spectacle prescription;  CTL contact lenses; OD right eye; OS left eye; OU both  eyes  XT exotropia; ET esotropia; PEK punctate epithelial keratitis; PEE punctate epithelial erosions; DES dry eye syndrome; MGD meibomian gland dysfunction; ATs artificial tears; PFAT's preservative free artificial tears; [place] nuclear sclerotic cataract; PSC posterior subcapsular cataract; ERM epi-retinal membrane; PVD posterior vitreous detachment; RD retinal detachment; DM diabetes mellitus; DR diabetic retinopathy; NPDR non-proliferative diabetic retinopathy; PDR proliferative diabetic retinopathy; CSME clinically significant macular edema; DME diabetic macular edema; dbh dot blot hemorrhages; CWS cotton wool spot; POAG primary open angle glaucoma; C/D cup-to-disc ratio; HVF humphrey visual field; GVF goldmann visual field; OCT optical coherence tomography; IOP intraocular pressure; BRVO Branch retinal vein occlusion; CRVO central retinal vein occlusion; CRAO central retinal artery occlusion; BRAO branch retinal artery occlusion; RT retinal tear; SB scleral buckle; PPV pars plana vitrectomy; VH Vitreous hemorrhage; PRP panretinal laser photocoagulation; IVK intravitreal kenalog; VMT vitreomacular traction; MH Macular hole;  NVD neovascularization of the disc; NVE neovascularization elsewhere; AREDS age related eye disease study; ARMD age related macular degeneration; POAG primary open angle glaucoma; EBMD epithelial/anterior basement membrane dystrophy; ACIOL anterior chamber intraocular lens; IOL intraocular lens; PCIOL posterior chamber intraocular lens; Phaco/IOL phacoemulsification with intraocular lens placement; [place] photorefractive keratectomy; LASIK laser assisted in situ keratomileusis; HTN hypertension; DM diabetes mellitus; COPD chronic obstructive pulmonary disease

## 2020-09-12 ENCOUNTER — Ambulatory Visit (INDEPENDENT_AMBULATORY_CARE_PROVIDER_SITE_OTHER): Payer: Medicare Other | Admitting: Ophthalmology

## 2020-09-12 ENCOUNTER — Other Ambulatory Visit: Payer: Self-pay

## 2020-09-12 ENCOUNTER — Encounter (INDEPENDENT_AMBULATORY_CARE_PROVIDER_SITE_OTHER): Payer: Self-pay | Admitting: Ophthalmology

## 2020-09-12 DIAGNOSIS — H353211 Exudative age-related macular degeneration, right eye, with active choroidal neovascularization: Secondary | ICD-10-CM | POA: Diagnosis not present

## 2020-09-12 DIAGNOSIS — H35033 Hypertensive retinopathy, bilateral: Secondary | ICD-10-CM | POA: Diagnosis not present

## 2020-09-12 DIAGNOSIS — H40003 Preglaucoma, unspecified, bilateral: Secondary | ICD-10-CM | POA: Diagnosis not present

## 2020-09-12 DIAGNOSIS — H25813 Combined forms of age-related cataract, bilateral: Secondary | ICD-10-CM

## 2020-09-12 DIAGNOSIS — H3581 Retinal edema: Secondary | ICD-10-CM | POA: Diagnosis not present

## 2020-09-12 DIAGNOSIS — H353122 Nonexudative age-related macular degeneration, left eye, intermediate dry stage: Secondary | ICD-10-CM | POA: Diagnosis not present

## 2020-09-12 DIAGNOSIS — I10 Essential (primary) hypertension: Secondary | ICD-10-CM | POA: Diagnosis not present

## 2020-09-12 MED ORDER — BEVACIZUMAB CHEMO INJECTION 1.25MG/0.05ML SYRINGE FOR KALEIDOSCOPE
1.2500 mg | INTRAVITREAL | Status: AC | PRN
  Administered 2020-09-12: 1.25 mg via INTRAVITREAL

## 2020-09-19 ENCOUNTER — Other Ambulatory Visit: Payer: Self-pay | Admitting: Family Medicine

## 2020-09-21 ENCOUNTER — Other Ambulatory Visit: Payer: Self-pay | Admitting: Family Medicine

## 2020-09-21 DIAGNOSIS — L299 Pruritus, unspecified: Secondary | ICD-10-CM

## 2020-09-21 DIAGNOSIS — Z23 Encounter for immunization: Secondary | ICD-10-CM | POA: Diagnosis not present

## 2020-09-25 DIAGNOSIS — H40013 Open angle with borderline findings, low risk, bilateral: Secondary | ICD-10-CM | POA: Diagnosis not present

## 2020-09-25 DIAGNOSIS — H2513 Age-related nuclear cataract, bilateral: Secondary | ICD-10-CM | POA: Diagnosis not present

## 2020-09-25 DIAGNOSIS — H40033 Anatomical narrow angle, bilateral: Secondary | ICD-10-CM | POA: Diagnosis not present

## 2020-09-25 DIAGNOSIS — H25013 Cortical age-related cataract, bilateral: Secondary | ICD-10-CM | POA: Diagnosis not present

## 2020-10-12 NOTE — Progress Notes (Signed)
[facility] Note  10/17/2020   CHIEF COMPLAINT Patient presents for Retina Follow Up  HISTORY OF PRESENT ILLNESS: Faith Reeves is a 69 y.o. female who presents to the clinic today for:   HPI    Retina Follow Up    Patient presents with  Wet AMD.  In right eye.  This started 5 weeks ago.  I, the attending physician,  performed the HPI with the patient and updated documentation appropriately.          Comments    Patient here for 5 weeks retina follow up for exu ARMD OD. Patient states vision doing ok. Below OD lower lid area had some pain.       Last edited by Bernarda Caffey, MD on 10/17/2020  2:30 PM. (History)    pt states her vision is doing "okay"  Referring physician: Susy Frizzle, MD [address] [place],  [place] [address]  HISTORICAL INFORMATION:   Selected notes from the MEDICAL RECORD NUMBER Referred by Dr. Quentin Ore for concern of exu ARMD OU   CURRENT MEDICATIONS: No current outpatient medications on file. (Ophthalmic Drugs)   No current facility-administered medications for this visit. (Ophthalmic Drugs)   Current Outpatient Medications (Other)  Medication Sig  . albuterol (PROVENTIL HFA;VENTOLIN HFA) 108 (90 Base) MCG/ACT inhaler Inhale 2 puffs into the lungs every 4 (four) hours as needed for wheezing or shortness of breath.  Marland Kitchen amoxicillin (AMOXIL) 500 MG capsule Take 2,000 mg by mouth once. Prior to dental work  . celecoxib (CELEBREX) 200 MG capsule TAKE 1 CAPSULE BY MOUTH EVERY DAY  . gabapentin (NEURONTIN) 300 MG capsule TAKE 1 CAPSULE BY MOUTH THREE TIMES A DAY  . ipratropium (ATROVENT) 0.06 % nasal spray SPRAY 2 SPRAYS INTO EACH NOSTRIL 4 TIMES A DAY  . levocetirizine (XYZAL) 5 MG tablet TAKE 1 TABLET BY MOUTH EVERY DAY IN THE EVENING  . mometasone (ELOCON) 0.1 % cream APPLY 1 APPLICATION TOPICALLY DAILY AS NEEDED.  Marland Kitchen oxyCODONE-acetaminophen (PERCOCET) 10-325 MG tablet Take 1 tablet by mouth every 4 (four)  hours as needed for pain (G89.4).  . pantoprazole (PROTONIX) 40 MG tablet TAKE 1 TABLET BY MOUTH EVERY DAY  . sertraline (ZOLOFT) 50 MG tablet Take 1.5 tablets (75 mg total) by mouth daily.  Marland Kitchen telmisartan-hydrochlorothiazide (MICARDIS HCT) 80-12.5 MG tablet Take 1 tablet by mouth daily.  . valACYclovir (VALTREX) 1000 MG tablet Take 2 tablets (2,000 mg total) by mouth 2 (two) times daily.  Marland Kitchen zolpidem (AMBIEN) 10 MG tablet TAKE 1 TABLET BY MOUTH AT BEDTIME AS NEEDED FOR SLEEP.   Current Facility-Administered Medications (Other)  Medication Route  . Bevacizumab (AVASTIN) SOLN 1.25 mg Intravitreal      REVIEW OF SYSTEMS: ROS    Positive for: Eyes   Negative for: Constitutional, Gastrointestinal, Neurological, Skin, Genitourinary, Musculoskeletal, HENT, Endocrine, Cardiovascular, Respiratory, Psychiatric, Allergic/Imm, Heme/Lymph   Last edited by Theodore Demark, COA on 10/17/2020  1:25 PM. (History)       ALLERGIES Allergies  Allergen Reactions  . Cymbalta [Duloxetine Hcl] Swelling    PAST MEDICAL HISTORY Past Medical History:  Diagnosis Date  . Allergy   . Anemia   . Anxiety   . Blood transfusion without reported diagnosis   . Cataract   . GERD (gastroesophageal reflux disease)   . Hypertension   . Hypertensive retinopathy    OU  . Macular degeneration    Past Surgical History:  Procedure Laterality Date  .  ABDOMINAL HYSTERECTOMY    . ACHILLES TENDON REPAIR Bilateral   . APPENDECTOMY    . BACK SURGERY    . BILATERAL CARPAL TUNNEL RELEASE    . CHOLECYSTECTOMY    . COLONOSCOPY    . HERNIA REPAIR    . JOINT REPLACEMENT    . MELANOMA EXCISION Right 09/15/2017   Procedure: EXCISION LIPOSARCOMA RIGHT ARM;  Surgeon: Georganna Skeans, MD;  Location: [facility];  Service: General;  Laterality: Right;  . SPINE SURGERY      FAMILY HISTORY Family History  Problem Relation Age of Onset  . Hypertension Mother   . Cancer Sister   . Stroke Maternal Grandmother     SOCIAL  HISTORY Social History   Tobacco Use  . Smoking status: Never Smoker  . Smokeless tobacco: Never Used  Vaping Use  . Vaping Use: Never used  Substance Use Topics  . Alcohol use: Yes    Alcohol/week: 1.0 standard drink    Types: 1 Glasses of wine per week  . Drug use: No         OPHTHALMIC EXAM:  Base Eye Exam    Visual Acuity (Snellen - Linear)      Right Left   Dist cc 20/40 -2 20/20   Dist ph cc 20/30 +2    Correction: Glasses       Tonometry (Tonopen, 1:22 PM)      Right Left   Pressure 10 10       Pupils      Dark Light Shape React APD   Right 3 2 Round Brisk None   Left 3 2 Round Brisk None       Visual Fields (Counting fingers)      Left Right    Full Full       Extraocular Movement      Right Left    Full Full       Neuro/Psych    Oriented x3: Yes   Mood/Affect: Normal       Dilation    Both eyes: 1.0% Mydriacyl, 2.5% Phenylephrine @ 1:22 PM        Slit Lamp and Fundus Exam    Slit Lamp Exam      Right Left   Lids/Lashes Dermatochalasis - upper lid, mild Meibomian gland dysfunction Dermatochalasis - upper lid, mild Meibomian gland dysfunction   Conjunctiva/Sclera White and quiet White and quiet   Cornea 2+ diffuse Punctate epithelial erosions 2+ diffuse Punctate epithelial erosions   Anterior Chamber deep and clear deep and clear   Iris Round and dilated Round and dilated   Lens 2-3+ Nuclear sclerosis, 2-3+ Cortical cataract 2+ Nuclear sclerosis, 2+ Cortical cataract   Vitreous Vitreous syneresis, Posterior vitreous detachment Vitreous syneresis, Posterior vitreous detachment       Fundus Exam      Right Left   Disc Pink and Sharp, Compact Pink and Sharp, temporal PPP   C/D Ratio 0.2 0.1   Macula Blunted foveal reflex, drusen, RPE mottling, clumping and atrophy, +CNV, interval improvement in central SRF -- resolved, No heme. Flat, good foveal reflex, scattered Drusen, RPE mottling and clumping, focal PEDs   Vessels Vascular  attenuation, Tortuous Vascular attenuation, mild tortuousity   Periphery Attached, mild reticular degeneration no heme Attached, no heme        Refraction    Wearing Rx      Sphere Cylinder Axis Add   Right +2.00 +0.50 008 +2.50   Left +2.50 +1.00 154 +2.50  Type: PAL          IMAGING AND PROCEDURES  Imaging and Procedures for @TODAY @  OCT, Retina - OU - Both Eyes       Right Eye Quality was good. Central Foveal Thickness: 229. Progression has improved. Findings include no IRF, retinal drusen , pigment epithelial detachment, outer retinal atrophy, normal foveal contour, no SRF (Interval resolution of SRF).   Left Eye Quality was good. Central Foveal Thickness: 268. Progression has been stable. Findings include normal foveal contour, no SRF, no IRF, retinal drusen .   Notes *Images captured and stored on drive  Diagnosis / Impression:  OD: exu ARMD -- Interval resolution of SRF OS: non-exu ARMD -- stable   Clinical management:  See below  Abbreviations: NFP - Normal foveal profile. CME - cystoid macular edema. PED - pigment epithelial detachment. IRF - intraretinal fluid. SRF - subretinal fluid. EZ - ellipsoid zone. ERM - epiretinal membrane. ORA - outer retinal atrophy. ORT - outer retinal tubulation. SRHM - subretinal hyper-reflective material        Intravitreal Injection, Pharmacologic Agent - OD - Right Eye       Time Out 10/17/2020. 1:35 PM. Confirmed correct patient, procedure, site, and patient consented.   Anesthesia Topical anesthesia was used. Anesthetic medications included Lidocaine 2%, Proparacaine 0.5%.   Procedure Preparation included 5% betadine to ocular surface, eyelid speculum. A supplied needle was used.   Injection:  1.25 mg Bevacizumab (AVASTIN) 1.25mg /0.25mL SOLN   NDC: 97026-378-58, Lot: [id number] , Expiration date: 11/29/2020   Route: Intravitreal, Site: Right Eye, Waste: 0 mL  Post-op Post injection exam found visual acuity of  at least counting fingers. The patient tolerated the procedure well. There were no complications. The patient received written and verbal post procedure care education. Post injection medications were not given.                 ASSESSMENT/PLAN:    ICD-10-CM   1. Exudative age-related macular degeneration of right eye with active choroidal neovascularization (HCC)  H35.3211 Intravitreal Injection, Pharmacologic Agent - OD - Right Eye    Bevacizumab (AVASTIN) SOLN 1.25 mg  2. Retinal edema  H35.81 OCT, Retina - OU - Both Eyes  3. Intermediate stage nonexudative age-related macular degeneration of left eye  H35.3122   4. Essential hypertension  I10   5. Hypertensive retinopathy of both eyes  H35.033   6. Combined forms of age-related cataract of both eyes  H25.813   7. Glaucoma suspect of both eyes  H40.003     1,2. Exudative age related macular degeneration, right eye  - FA (03.10.20) shows +CNV w/ staining/leakage  - s/p IVA OD #1 (03.10.20), #2 (04.13.20), #3 (05.11.20), #4 (06.23.20), #5 (8.18.20), #6 (10.27.20), #7 (01.26.21), #8 (04.26.21), #9 (08.02.21), #10 (09.13.21), #11 (10.11.21), #12 (11.9.21)  - pt was doing very well with q3 mo maintenance injections, but then developed interval increases in central SRF over several visits -- IVA resistance vs interval too long  - OCT today shows interval resolution of SRF at 5 wks  - BCVA decreased to 20/30 from 20/25  - FA 9.13.21 shows interval increase in perifoveal leakage OD -- no classic CSCR findings  - recommend IVA OD #13 today, 12.14.21 w/ f/u in 6 wks  - pt wishes to be treated with IVA OD - consider switching medications if SRF worsens  - RBA of procedure discussed, questions answered  - informed consent obtained and signed  - see procedure note  -  f/u in 6 wks -- DFE/OCT/possible injection, tx and ext as able  3. Age related macular degeneration, non-exudative, left eye  - The incidence, anatomy, and pathology of dry  AMD, risk of progression, and the AREDS and AREDS 2 study including smoking risks discussed with patient.  - intermediate stage  - recommend amsler grid monitoring  4,5. Hypertensive retinopathy OU  - discussed importance of tight BP control  - monitor  6. Mixed form age related cataracts OU  - The symptoms of cataract, surgical options, and treatments and risks were discussed with patient.  - discussed diagnosis and progression  - not yet visually significant  - monitor for now  7. Glaucoma Suspect OU  - IOP 10 OU today  - under the expert management of Dr. Kathlen Mody   Ophthalmic Meds Ordered this visit:  Meds ordered this encounter  Medications  . Bevacizumab (AVASTIN) SOLN 1.25 mg   Return in about 6 weeks (around 11/28/2020) for f/u exu ARMD OD, DFE, OCT.  There are no Patient Instructions on file for this visit.  This document serves as a record of services personally performed by Gardiner Sleeper, MD, PhD. It was created on their behalf by San Jetty. Owens Shark, OA an ophthalmic technician. The creation of this record is the provider's dictation and/or activities during the visit.    Electronically signed by: San Jetty. Marguerita Merles 12.14.2021 2:33 PM  Gardiner Sleeper, M.D., Ph.D. Diseases & Surgery of the Retina and Vitreous [facility] 10/17/2020   I have reviewed the above documentation for accuracy and completeness, and I agree with the above. Gardiner Sleeper, M.D., Ph.D. 10/17/20 2:33 PM   Abbreviations: M myopia (nearsighted); A astigmatism; H hyperopia (farsighted); P presbyopia; Mrx spectacle prescription;  CTL contact lenses; OD right eye; OS left eye; OU both eyes  XT exotropia; ET esotropia; PEK punctate epithelial keratitis; PEE punctate epithelial erosions; DES dry eye syndrome; MGD meibomian gland dysfunction; ATs artificial tears; PFAT's preservative free artificial tears; [place] nuclear sclerotic cataract; PSC posterior subcapsular cataract; ERM  epi-retinal membrane; PVD posterior vitreous detachment; RD retinal detachment; DM diabetes mellitus; DR diabetic retinopathy; NPDR non-proliferative diabetic retinopathy; PDR proliferative diabetic retinopathy; CSME clinically significant macular edema; DME diabetic macular edema; dbh dot blot hemorrhages; CWS cotton wool spot; POAG primary open angle glaucoma; C/D cup-to-disc ratio; HVF humphrey visual field; GVF goldmann visual field; OCT optical coherence tomography; IOP intraocular pressure; BRVO Branch retinal vein occlusion; CRVO central retinal vein occlusion; CRAO central retinal artery occlusion; BRAO branch retinal artery occlusion; RT retinal tear; SB scleral buckle; PPV pars plana vitrectomy; VH Vitreous hemorrhage; PRP panretinal laser photocoagulation; IVK intravitreal kenalog; VMT vitreomacular traction; MH Macular hole;  NVD neovascularization of the disc; NVE neovascularization elsewhere; AREDS age related eye disease study; ARMD age related macular degeneration; POAG primary open angle glaucoma; EBMD epithelial/anterior basement membrane dystrophy; ACIOL anterior chamber intraocular lens; IOL intraocular lens; PCIOL posterior chamber intraocular lens; Phaco/IOL phacoemulsification with intraocular lens placement; [place] photorefractive keratectomy; LASIK laser assisted in situ keratomileusis; HTN hypertension; DM diabetes mellitus; COPD chronic obstructive pulmonary disease

## 2020-10-13 ENCOUNTER — Other Ambulatory Visit: Payer: Self-pay | Admitting: Family Medicine

## 2020-10-14 ENCOUNTER — Other Ambulatory Visit: Payer: Self-pay | Admitting: Family Medicine

## 2020-10-16 ENCOUNTER — Telehealth: Payer: Self-pay | Admitting: Family Medicine

## 2020-10-16 NOTE — Telephone Encounter (Signed)
Michael left vm requesting refill on oxycodoen acetaminophen   CB# [phone]

## 2020-10-17 ENCOUNTER — Encounter (INDEPENDENT_AMBULATORY_CARE_PROVIDER_SITE_OTHER): Payer: Self-pay | Admitting: Ophthalmology

## 2020-10-17 ENCOUNTER — Other Ambulatory Visit: Payer: Self-pay

## 2020-10-17 ENCOUNTER — Ambulatory Visit (INDEPENDENT_AMBULATORY_CARE_PROVIDER_SITE_OTHER): Payer: Medicare Other | Admitting: Ophthalmology

## 2020-10-17 DIAGNOSIS — H3581 Retinal edema: Secondary | ICD-10-CM | POA: Diagnosis not present

## 2020-10-17 DIAGNOSIS — H353211 Exudative age-related macular degeneration, right eye, with active choroidal neovascularization: Secondary | ICD-10-CM | POA: Diagnosis not present

## 2020-10-17 DIAGNOSIS — H40003 Preglaucoma, unspecified, bilateral: Secondary | ICD-10-CM | POA: Diagnosis not present

## 2020-10-17 DIAGNOSIS — H25813 Combined forms of age-related cataract, bilateral: Secondary | ICD-10-CM

## 2020-10-17 DIAGNOSIS — H35033 Hypertensive retinopathy, bilateral: Secondary | ICD-10-CM

## 2020-10-17 DIAGNOSIS — I10 Essential (primary) hypertension: Secondary | ICD-10-CM

## 2020-10-17 DIAGNOSIS — H353122 Nonexudative age-related macular degeneration, left eye, intermediate dry stage: Secondary | ICD-10-CM

## 2020-10-17 MED ORDER — BEVACIZUMAB CHEMO INJECTION 1.25MG/0.05ML SYRINGE FOR KALEIDOSCOPE
1.2500 mg | INTRAVITREAL | Status: AC | PRN
  Administered 2020-10-17: 1.25 mg via INTRAVITREAL

## 2020-10-17 NOTE — Telephone Encounter (Signed)
Ok to refill??  Last office visit/ refill 08/01/2020, #3 refills.

## 2020-10-18 ENCOUNTER — Other Ambulatory Visit: Payer: Self-pay | Admitting: Family Medicine

## 2020-11-13 ENCOUNTER — Other Ambulatory Visit: Payer: Self-pay | Admitting: Family Medicine

## 2020-11-23 NOTE — Progress Notes (Signed)
[facility] Note  11/28/2020   CHIEF COMPLAINT Patient presents for Retina Follow Up  HISTORY OF PRESENT ILLNESS: Faith Reeves is a 70 y.o. female who presents to the clinic today for:   HPI    Retina Follow Up    Patient presents with  Wet AMD.  In right eye.  Duration of 6 weeks.  Since onset it is stable.  I, the attending physician,  performed the HPI with the patient and updated documentation appropriately.          Comments    6 week follow up Exu ARMD OD- Vision stable OU.       Last edited by Bernarda Caffey, MD on 11/28/2020 11:37 PM. (History)    pt states  Referring physician: Susy Frizzle, MD [address] [facility] [address] [place],  [place] [address]  HISTORICAL INFORMATION:   Selected notes from the MEDICAL RECORD NUMBER Referred by Dr. Quentin Ore for concern of exu ARMD OU   CURRENT MEDICATIONS: No current outpatient medications on file. (Ophthalmic Drugs)   No current facility-administered medications for this visit. (Ophthalmic Drugs)   Current Outpatient Medications (Other)  Medication Sig  . albuterol (PROVENTIL HFA;VENTOLIN HFA) 108 (90 Base) MCG/ACT inhaler Inhale 2 puffs into the lungs every 4 (four) hours as needed for wheezing or shortness of breath.  Marland Kitchen amoxicillin (AMOXIL) 500 MG capsule Take 2,000 mg by mouth once. Prior to dental work  . celecoxib (CELEBREX) 200 MG capsule TAKE 1 CAPSULE BY MOUTH EVERY DAY  . gabapentin (NEURONTIN) 300 MG capsule TAKE 1 CAPSULE BY MOUTH THREE TIMES A DAY  . ipratropium (ATROVENT) 0.06 % nasal spray SPRAY 2 SPRAYS INTO EACH NOSTRIL 4 TIMES A DAY  . levocetirizine (XYZAL) 5 MG tablet TAKE 1 TABLET BY MOUTH EVERY DAY IN THE EVENING  . mometasone (ELOCON) 0.1 % cream APPLY 1 APPLICATION TOPICALLY DAILY AS NEEDED.  Marland Kitchen oxyCODONE-acetaminophen (PERCOCET) 10-325 MG tablet Take 1 tablet by mouth every 4 (four) hours as needed for pain (G89.4).  . pantoprazole (PROTONIX) 40 MG tablet TAKE 1 TABLET BY  MOUTH EVERY DAY  . sertraline (ZOLOFT) 50 MG tablet Take 1.5 tablets (75 mg total) by mouth daily.  Marland Kitchen telmisartan-hydrochlorothiazide (MICARDIS HCT) 80-12.5 MG tablet Take 1 tablet by mouth daily.  . valACYclovir (VALTREX) 1000 MG tablet Take 2 tablets (2,000 mg total) by mouth 2 (two) times daily.  Marland Kitchen zolpidem (AMBIEN) 10 MG tablet TAKE 1 TABLET BY MOUTH AT BEDTIME AS NEEDED FOR SLEEP.   Current Facility-Administered Medications (Other)  Medication Route  . Bevacizumab (AVASTIN) SOLN 1.25 mg Intravitreal      REVIEW OF SYSTEMS: ROS    Positive for: Musculoskeletal, Eyes, Respiratory, Psychiatric   Negative for: Constitutional, Gastrointestinal, Neurological, Skin, Genitourinary, HENT, Endocrine, Cardiovascular, Allergic/Imm, Heme/Lymph   Last edited by Leonie Douglas, COA on 11/28/2020  1:58 PM. (History)       ALLERGIES Allergies  Allergen Reactions  . Cymbalta [Duloxetine Hcl] Swelling    PAST MEDICAL HISTORY Past Medical History:  Diagnosis Date  . Allergy   . Anemia   . Anxiety   . Blood transfusion without reported diagnosis   . Cataract   . GERD (gastroesophageal reflux disease)   . Hypertension   . Hypertensive retinopathy    OU  . Macular degeneration    Past Surgical History:  Procedure Laterality Date  . ABDOMINAL HYSTERECTOMY    . ACHILLES TENDON REPAIR Bilateral   . APPENDECTOMY    .  BACK SURGERY    . BILATERAL CARPAL TUNNEL RELEASE    . CHOLECYSTECTOMY    . COLONOSCOPY    . HERNIA REPAIR    . JOINT REPLACEMENT    . MELANOMA EXCISION Right 09/15/2017   Procedure: EXCISION LIPOSARCOMA RIGHT ARM;  Surgeon: Georganna Skeans, MD;  Location: [facility];  Service: General;  Laterality: Right;  . SPINE SURGERY      FAMILY HISTORY Family History  Problem Relation Age of Onset  . Hypertension Mother   . Cancer Sister   . Stroke Maternal Grandmother     SOCIAL HISTORY Social History   Tobacco Use  . Smoking status: Never Smoker  . Smokeless tobacco:  Never Used  Vaping Use  . Vaping Use: Never used  Substance Use Topics  . Alcohol use: Yes    Alcohol/week: 1.0 standard drink    Types: 1 Glasses of wine per week  . Drug use: No         OPHTHALMIC EXAM:  Base Eye Exam    Visual Acuity (Snellen - Linear)      Right Left   Dist cc 20/40 +1 20/20 -2   Dist ph cc 20/25 -2    Correction: Glasses       Tonometry (Tonopen, 2:03 PM)      Right Left   Pressure 12 14       Pupils      Dark Light Shape React APD   Right 3 2 Round Brisk None   Left 3 2 Round Brisk None       Visual Fields (Counting fingers)      Left Right    Full Full       Extraocular Movement      Right Left    Full Full       Neuro/Psych    Oriented x3: Yes   Mood/Affect: Normal       Dilation    Both eyes: 1.0% Mydriacyl, 2.5% Phenylephrine @ 2:03 PM        Slit Lamp and Fundus Exam    Slit Lamp Exam      Right Left   Lids/Lashes Dermatochalasis - upper lid, mild Meibomian gland dysfunction Dermatochalasis - upper lid, mild Meibomian gland dysfunction   Conjunctiva/Sclera White and quiet White and quiet   Cornea 2+ diffuse Punctate epithelial erosions 2+ diffuse Punctate epithelial erosions   Anterior Chamber deep and clear deep and clear   Iris Round and dilated Round and dilated   Lens 2-3+ Nuclear sclerosis, 2-3+ Cortical cataract 2+ Nuclear sclerosis, 2+ Cortical cataract   Vitreous Vitreous syneresis, Posterior vitreous detachment, prominent linear vitreous condensation over macula Vitreous syneresis, Posterior vitreous detachment       Fundus Exam      Right Left   Disc Pink and Sharp, Compact Pink and Sharp, temporal PPP   C/D Ratio 0.2 0.1   Macula Blunted foveal reflex, drusen, RPE mottling, clumping and atrophy, +CNV, stable resolution of central SRF, No heme. Flat, good foveal reflex, scattered Drusen, RPE mottling and clumping, focal PEDs   Vessels Vascular attenuation, Tortuous Vascular attenuation, mild tortuousity    Periphery Attached, mild reticular degeneration no heme Attached, no heme        Refraction    Wearing Rx      Sphere Cylinder Axis Add   Right +2.00 +0.50 008 +2.50   Left +2.50 +1.00 154 +2.50   Type: PAL          IMAGING  AND PROCEDURES  Imaging and Procedures for @TODAY @  OCT, Retina - OU - Both Eyes       Right Eye Quality was good. Central Foveal Thickness: 236. Progression has been stable. Findings include no IRF, retinal drusen , pigment epithelial detachment, outer retinal atrophy, normal foveal contour, no SRF (stable resolution of SRF at 6 week interval ).   Left Eye Quality was good. Central Foveal Thickness: 268. Progression has been stable. Findings include normal foveal contour, no SRF, no IRF, retinal drusen .   Notes *Images captured and stored on drive  Diagnosis / Impression:  OD: exu ARMD -- stable resolution of SRF at 6 week interval OS: non-exu ARMD -- stable   Clinical management:  See below  Abbreviations: NFP - Normal foveal profile. CME - cystoid macular edema. PED - pigment epithelial detachment. IRF - intraretinal fluid. SRF - subretinal fluid. EZ - ellipsoid zone. ERM - epiretinal membrane. ORA - outer retinal atrophy. ORT - outer retinal tubulation. SRHM - subretinal hyper-reflective material        Intravitreal Injection, Pharmacologic Agent - OD - Right Eye       Time Out 11/28/2020. 2:21 PM. Confirmed correct patient, procedure, site, and patient consented.   Anesthesia Topical anesthesia was used. Anesthetic medications included Lidocaine 2%, Proparacaine 0.5%.   Procedure Preparation included 5% betadine to ocular surface, eyelid speculum. A (32g) needle was used.   Injection:  1.25 mg Bevacizumab (AVASTIN) 1.25mg /0.84mL SOLN   NDC: H061816, LotAR:[id number], Expiration date: 12/21/2020   Route: Intravitreal, Site: Right Eye, Waste: 0.05 mL  Post-op Post injection exam found visual acuity of at least counting fingers. The  patient tolerated the procedure well. There were no complications. The patient received written and verbal post procedure care education. Post injection medications were not given.                 ASSESSMENT/PLAN:    ICD-10-CM   1. Exudative age-related macular degeneration of right eye with active choroidal neovascularization (HCC)  H35.3211 Intravitreal Injection, Pharmacologic Agent - OD - Right Eye    Bevacizumab (AVASTIN) SOLN 1.25 mg  2. Retinal edema  H35.81 OCT, Retina - OU - Both Eyes  3. Intermediate stage nonexudative age-related macular degeneration of left eye  H35.3122   4. Essential hypertension  I10   5. Hypertensive retinopathy of both eyes  H35.033   6. Combined forms of age-related cataract of both eyes  H25.813   7. Glaucoma suspect of both eyes  H40.003     1,2. Exudative age related macular degeneration, right eye  - FA (03.10.20) shows +CNV w/ staining/leakage  - s/p IVA OD #1 (03.10.20), #2 (04.13.20), #3 (05.11.20), #4 (06.23.20), #5 (8.18.20), #6 (10.27.20), #7 (01.26.21), #8 (04.26.21), #9 (08.02.21), #10 (09.13.21), #11 (10.11.21), #12 (11.9.21), #13 (12.14.21)  - pt was doing very well with q3 mo maintenance injections, but then developed interval increases in central SRF over several visits -- IVA resistance vs interval too long  - OCT today shows stable resolution of SRF at 6 wks  - BCVA 20/25 from 20/30  - FA 9.13.21 shows interval increase in perifoveal leakage OD  - recommend IVA OD #14 today, 01.24.22 w/ f/u in 8 wks  - pt wishes to be treated with IVA OD - consider switching medications if SRF worsens  - RBA of procedure discussed, questions answered  - informed consent obtained and signed  - see procedure note  - f/u in 8 wks -- DFE/OCT/possible injection,  tx and ext as able  3. Age related macular degeneration, non-exudative, left eye  - The incidence, anatomy, and pathology of dry AMD, risk of progression, and the AREDS and AREDS 2 study  including smoking risks discussed with patient.  - intermediate stage  - recommend amsler grid monitoring  4,5. Hypertensive retinopathy OU  - discussed importance of tight BP control  - monitor  6. Mixed form age related cataracts OU  - The symptoms of cataract, surgical options, and treatments and risks were discussed with patient.  - discussed diagnosis and progression  - monitor  7. Glaucoma Suspect OU  - IOP 10 OU today  - under the expert management of Dr. Kathlen Mody   Ophthalmic Meds Ordered this visit:  Meds ordered this encounter  Medications  . Bevacizumab (AVASTIN) SOLN 1.25 mg   Return in about 8 weeks (around 01/23/2021) for f/u exu ARMD OD, DFE, OCT.  There are no Patient Instructions on file for this visit.  This document serves as a record of services personally performed by Gardiner Sleeper, MD, PhD. It was created on their behalf by Leeann Must, [place], an ophthalmic technician. The creation of this record is the provider's dictation and/or activities during the visit.    Electronically signed by: Leeann Must, COA @TODAY @ 11:42 PM  Gardiner Sleeper, M.D., Ph.D. Diseases & Surgery of the Retina and [place] 11/28/2020   I have reviewed the above documentation for accuracy and completeness, and I agree with the above. Gardiner Sleeper, M.D., Ph.D. 11/28/20 11:42 PM   Abbreviations: M myopia (nearsighted); A astigmatism; H hyperopia (farsighted); P presbyopia; Mrx spectacle prescription;  CTL contact lenses; OD right eye; OS left eye; OU both eyes  XT exotropia; ET esotropia; PEK punctate epithelial keratitis; PEE punctate epithelial erosions; DES dry eye syndrome; MGD meibomian gland dysfunction; ATs artificial tears; PFAT's preservative free artificial tears; Mullins nuclear sclerotic cataract; PSC posterior subcapsular cataract; ERM epi-retinal membrane; PVD posterior vitreous detachment; RD retinal detachment; DM diabetes mellitus;  DR diabetic retinopathy; NPDR non-proliferative diabetic retinopathy; PDR proliferative diabetic retinopathy; CSME clinically significant macular edema; DME diabetic macular edema; dbh dot blot hemorrhages; CWS cotton wool spot; POAG primary open angle glaucoma; C/D cup-to-disc ratio; HVF humphrey visual field; GVF goldmann visual field; OCT optical coherence tomography; IOP intraocular pressure; BRVO Branch retinal vein occlusion; CRVO central retinal vein occlusion; CRAO central retinal artery occlusion; BRAO branch retinal artery occlusion; RT retinal tear; SB scleral buckle; PPV pars plana vitrectomy; VH Vitreous hemorrhage; PRP panretinal laser photocoagulation; IVK intravitreal kenalog; VMT vitreomacular traction; MH Macular hole;  NVD neovascularization of the disc; NVE neovascularization elsewhere; AREDS age related eye disease study; ARMD age related macular degeneration; POAG primary open angle glaucoma; EBMD epithelial/anterior basement membrane dystrophy; ACIOL anterior chamber intraocular lens; IOL intraocular lens; PCIOL posterior chamber intraocular lens; Phaco/IOL phacoemulsification with intraocular lens placement; [place] photorefractive keratectomy; LASIK laser assisted in situ keratomileusis; HTN hypertension; DM diabetes mellitus; COPD chronic obstructive pulmonary disease

## 2020-11-28 ENCOUNTER — Encounter (INDEPENDENT_AMBULATORY_CARE_PROVIDER_SITE_OTHER): Payer: Self-pay | Admitting: Ophthalmology

## 2020-11-28 ENCOUNTER — Other Ambulatory Visit: Payer: Self-pay

## 2020-11-28 ENCOUNTER — Ambulatory Visit (INDEPENDENT_AMBULATORY_CARE_PROVIDER_SITE_OTHER): Payer: Medicare Other | Admitting: Ophthalmology

## 2020-11-28 DIAGNOSIS — H35033 Hypertensive retinopathy, bilateral: Secondary | ICD-10-CM

## 2020-11-28 DIAGNOSIS — I10 Essential (primary) hypertension: Secondary | ICD-10-CM | POA: Diagnosis not present

## 2020-11-28 DIAGNOSIS — H3581 Retinal edema: Secondary | ICD-10-CM | POA: Diagnosis not present

## 2020-11-28 DIAGNOSIS — H25813 Combined forms of age-related cataract, bilateral: Secondary | ICD-10-CM | POA: Diagnosis not present

## 2020-11-28 DIAGNOSIS — H40003 Preglaucoma, unspecified, bilateral: Secondary | ICD-10-CM

## 2020-11-28 DIAGNOSIS — H353122 Nonexudative age-related macular degeneration, left eye, intermediate dry stage: Secondary | ICD-10-CM

## 2020-11-28 DIAGNOSIS — H353211 Exudative age-related macular degeneration, right eye, with active choroidal neovascularization: Secondary | ICD-10-CM | POA: Diagnosis not present

## 2020-11-28 MED ORDER — BEVACIZUMAB CHEMO INJECTION 1.25MG/0.05ML SYRINGE FOR KALEIDOSCOPE
1.2500 mg | INTRAVITREAL | Status: AC | PRN
  Administered 2020-11-28: 1.25 mg via INTRAVITREAL

## 2020-12-04 ENCOUNTER — Other Ambulatory Visit: Payer: Self-pay | Admitting: Family Medicine

## 2020-12-04 MED ORDER — OXYCODONE-ACETAMINOPHEN 10-325 MG PO TABS: 1.0000 | ORAL_TABLET | ORAL | 0 refills | Status: DC | PRN

## 2020-12-04 NOTE — Telephone Encounter (Signed)
Pt husband called in said wife is needing a refill of  oxyCODONE-acetaminophen (PERCOCET) 10-325 MG tablet   Cb# : [phone]

## 2020-12-04 NOTE — Telephone Encounter (Signed)
Ok to refill??  Last office visit/ refill 08/01/2020.

## 2020-12-07 ENCOUNTER — Other Ambulatory Visit: Payer: Self-pay | Admitting: Family Medicine

## 2021-01-03 ENCOUNTER — Other Ambulatory Visit: Payer: Self-pay | Admitting: Family Medicine

## 2021-01-08 NOTE — Progress Notes (Addendum)
[facility] Note  01/16/2021   CHIEF COMPLAINT Patient presents for Retina Follow Up  HISTORY OF PRESENT ILLNESS: Faith Reeves is a 70 y.o. female who presents to the clinic today for:   HPI    Retina Follow Up    Patient presents with  Wet AMD.  In right eye.  This started 8 weeks ago.  I, the attending physician,  performed the HPI with the patient and updated documentation appropriately.          Comments    Patient here for 8 weeks retina follow up for exu ARMD OD. Patient states vision some days a little foggy. Some days it is ok. Today a little foggy. No eye pain.        Last edited by Bernarda Caffey, MD on 01/16/2021  1:39 PM. (History)    pt came a week early for her appt due to having to take care of her son next week, vision is stable  Referring physician: Hortencia Pilar, MD [place],  [place] [address]  HISTORICAL INFORMATION:   Selected notes from the MEDICAL RECORD NUMBER Referred by Dr. Quentin Ore for concern of exu ARMD OU   CURRENT MEDICATIONS: No current outpatient medications on file. (Ophthalmic Drugs)   No current facility-administered medications for this visit. (Ophthalmic Drugs)   Current Outpatient Medications (Other)  Medication Sig  . albuterol (PROVENTIL HFA;VENTOLIN HFA) 108 (90 Base) MCG/ACT inhaler Inhale 2 puffs into the lungs every 4 (four) hours as needed for wheezing or shortness of breath.  Marland Kitchen amoxicillin (AMOXIL) 500 MG capsule Take 2,000 mg by mouth once. Prior to dental work  . celecoxib (CELEBREX) 200 MG capsule TAKE 1 CAPSULE BY MOUTH EVERY DAY  . gabapentin (NEURONTIN) 300 MG capsule TAKE 1 CAPSULE BY MOUTH THREE TIMES A DAY  . ipratropium (ATROVENT) 0.06 % nasal spray SPRAY 2 SPRAYS INTO EACH NOSTRIL 4 TIMES A DAY  . levocetirizine (XYZAL) 5 MG tablet TAKE 1 TABLET BY MOUTH EVERY DAY IN THE EVENING  . mometasone (ELOCON) 0.1 % cream APPLY 1 APPLICATION TOPICALLY DAILY AS NEEDED.   Marland Kitchen oxyCODONE-acetaminophen (PERCOCET) 10-325 MG tablet Take 1 tablet by mouth every 4 (four) hours as needed for pain (G89.4).  . pantoprazole (PROTONIX) 40 MG tablet TAKE 1 TABLET BY MOUTH EVERY DAY  . sertraline (ZOLOFT) 50 MG tablet Take 1.5 tablets (75 mg total) by mouth daily.  Marland Kitchen telmisartan-hydrochlorothiazide (MICARDIS HCT) 80-12.5 MG tablet Take 1 tablet by mouth daily.  . valACYclovir (VALTREX) 1000 MG tablet Take 2 tablets (2,000 mg total) by mouth 2 (two) times daily.  Marland Kitchen zolpidem (AMBIEN) 10 MG tablet TAKE 1 TABLET BY MOUTH AT BEDTIME AS NEEDED FOR SLEEP.   Current Facility-Administered Medications (Other)  Medication Route  . Bevacizumab (AVASTIN) SOLN 1.25 mg Intravitreal      REVIEW OF SYSTEMS: ROS    Positive for: Musculoskeletal, Eyes, Respiratory, Psychiatric   Negative for: Constitutional, Gastrointestinal, Neurological, Skin, Genitourinary, HENT, Endocrine, Cardiovascular, Allergic/Imm, Heme/Lymph   Last edited by Theodore Demark, COA on 01/16/2021  1:23 PM. (History)       ALLERGIES Allergies  Allergen Reactions  . Cymbalta [Duloxetine Hcl] Swelling    PAST MEDICAL HISTORY Past Medical History:  Diagnosis Date  . Allergy   . Anemia   . Anxiety   . Blood transfusion without reported diagnosis   . Cataract   . GERD (gastroesophageal reflux disease)   . Hypertension   .  Hypertensive retinopathy    OU  . Macular degeneration    Past Surgical History:  Procedure Laterality Date  . ABDOMINAL HYSTERECTOMY    . ACHILLES TENDON REPAIR Bilateral   . APPENDECTOMY    . BACK SURGERY    . BILATERAL CARPAL TUNNEL RELEASE    . CHOLECYSTECTOMY    . COLONOSCOPY    . HERNIA REPAIR    . JOINT REPLACEMENT    . MELANOMA EXCISION Right 09/15/2017   Procedure: EXCISION LIPOSARCOMA RIGHT ARM;  Surgeon: Georganna Skeans, MD;  Location: [facility];  Service: General;  Laterality: Right;  . SPINE SURGERY      FAMILY HISTORY Family History  Problem Relation Age of  Onset  . Hypertension Mother   . Cancer Sister   . Stroke Maternal Grandmother     SOCIAL HISTORY Social History   Tobacco Use  . Smoking status: Never Smoker  . Smokeless tobacco: Never Used  Vaping Use  . Vaping Use: Never used  Substance Use Topics  . Alcohol use: Yes    Alcohol/week: 1.0 standard drink    Types: 1 Glasses of wine per week  . Drug use: No         OPHTHALMIC EXAM:  Base Eye Exam    Visual Acuity (Snellen - Linear)      Right Left   Dist cc 20/40 -1 20/20   Dist ph cc 20/25 -1    Correction: Glasses       Tonometry (Tonopen, 1:20 PM)      Right Left   Pressure 14 14       Pupils      Dark Light Shape React APD   Right 3 2 Round Brisk None   Left 3 2 Round Brisk None       Visual Fields (Counting fingers)      Left Right    Full Full       Extraocular Movement      Right Left    Full Full       Neuro/Psych    Oriented x3: Yes   Mood/Affect: Normal       Dilation    Both eyes: 1.0% Mydriacyl, 2.5% Phenylephrine @ 1:20 PM        Slit Lamp and Fundus Exam    Slit Lamp Exam      Right Left   Lids/Lashes Dermatochalasis - upper lid, mild Meibomian gland dysfunction Dermatochalasis - upper lid, mild Meibomian gland dysfunction   Conjunctiva/Sclera White and quiet White and quiet   Cornea 2+ diffuse Punctate epithelial erosions 2+ diffuse Punctate epithelial erosions   Anterior Chamber deep and clear deep and clear   Iris Round and dilated Round and dilated   Lens 2-3+ Nuclear sclerosis, 2-3+ Cortical cataract 2+ Nuclear sclerosis, 2+ Cortical cataract   Vitreous Vitreous syneresis, Posterior vitreous detachment, prominent linear vitreous condensation over macula Vitreous syneresis, Posterior vitreous detachment       Fundus Exam      Right Left   Disc Pink and Sharp, Compact Pink and Sharp, temporal PPP   C/D Ratio 0.2 0.1   Macula Blunted foveal reflex, drusen, RPE mottling, clumping and atrophy, +CNV, stable resolution of  central SRF, No heme. Flat, good foveal reflex, scattered Drusen, RPE mottling and clumping, focal PEDs   Vessels Vascular attenuation, Tortuous Vascular attenuation, mild tortuousity   Periphery Attached, mild reticular degeneration no heme Attached, no heme        Refraction  Wearing Rx      Sphere Cylinder Axis Add   Right +2.00 +0.50 008 +2.50   Left +2.50 +1.00 154 +2.50   Type: PAL          IMAGING AND PROCEDURES  Imaging and Procedures for @TODAY @  OCT, Retina - OU - Both Eyes       Right Eye Quality was good. Central Foveal Thickness: 239. Progression has been stable. Findings include no IRF, retinal drusen , pigment epithelial detachment, outer retinal atrophy, normal foveal contour, no SRF (stable resolution of SRF at 7 week interval ).   Left Eye Quality was good. Central Foveal Thickness: 267. Progression has been stable. Findings include normal foveal contour, no SRF, no IRF, retinal drusen .   Notes *Images captured and stored on drive  Diagnosis / Impression:  OD: exu ARMD -- stable resolution of SRF at 7 week interval OS: non-exu ARMD -- stable   Clinical management:  See below  Abbreviations: NFP - Normal foveal profile. CME - cystoid macular edema. PED - pigment epithelial detachment. IRF - intraretinal fluid. SRF - subretinal fluid. EZ - ellipsoid zone. ERM - epiretinal membrane. ORA - outer retinal atrophy. ORT - outer retinal tubulation. SRHM - subretinal hyper-reflective material        Intravitreal Injection, Pharmacologic Agent - OD - Right Eye       Time Out 01/16/2021. 1:17 PM. Confirmed correct patient, procedure, site, and patient consented.   Anesthesia Topical anesthesia was used. Anesthetic medications included Lidocaine 2%, Proparacaine 0.5%.   Procedure Preparation included 5% betadine to ocular surface, eyelid speculum. A (32g) needle was used.   Injection:  1.25 mg Bevacizumab (AVASTIN) 1.25mg /0.36mL SOLN   NDC:  25956-387-56, Lot: [id number] , Expiration date: 02/26/2021   Route: Intravitreal, Site: Right Eye, Waste: 0 mL  Post-op Post injection exam found visual acuity of at least counting fingers. The patient tolerated the procedure well. There were no complications. The patient received written and verbal post procedure care education. Post injection medications were not given.                 ASSESSMENT/PLAN:    ICD-10-CM   1. Exudative age-related macular degeneration of right eye with active choroidal neovascularization (HCC)  H35.3211 Intravitreal Injection, Pharmacologic Agent - OD - Right Eye    Bevacizumab (AVASTIN) SOLN 1.25 mg  2. Retinal edema  H35.81 OCT, Retina - OU - Both Eyes  3. Intermediate stage nonexudative age-related macular degeneration of left eye  H35.3122   4. Essential hypertension  I10   5. Hypertensive retinopathy of both eyes  H35.033   6. Combined forms of age-related cataract of both eyes  H25.813   7. Glaucoma suspect of both eyes  H40.003     1,2. Exudative age related macular degeneration, right eye  - FA (03.10.20) shows +CNV w/ staining/leakage  - s/p IVA OD #1 (03.10.20), #2 (04.13.20), #3 (05.11.20), #4 (06.23.20), #5 (8.18.20), #6 (10.27.20), #7 (01.26.21), #8 (04.26.21), #9 (08.02.21), #10 (09.13.21), #11 (10.11.21), #12 (11.9.21), #13 (12.14.21), #14 (1.24.22)  - pt was doing very well with q3 mo maintenance injections, but then developed interval increases in central SRF over several visits -- IVA resistance vs interval too long  - OCT today shows stable resolution of SRF at 7 wks  - BCVA 20/25 from 20/30  - FA 9.13.21 shows interval increase in perifoveal leakage OD  - recommend IVA OD #15 today, 3.15.22  w/ f/u in 9 wks  - pt wishes  to be treated with IVA OD - consider switching medications if SRF worsens  - RBA of procedure discussed, questions answered  - informed consent obtained and signed  - see procedure note  - f/u in 9 wks --  DFE/OCT/possible injection, tx and ext as able  3. Age related macular degeneration, non-exudative, left eye  - The incidence, anatomy, and pathology of dry AMD, risk of progression, and the AREDS and AREDS 2 study including smoking risks discussed with patient.  - intermediate stage  - recommend amsler grid monitoring  4,5. Hypertensive retinopathy OU  - discussed importance of tight BP control  - monitor  6. Mixed form age related cataracts OU  - The symptoms of cataract, surgical options, and treatments and risks were discussed with patient.  - discussed diagnosis and progression  - monitor  7. Glaucoma Suspect OU  - IOP 14 OU today  - under the expert management of Dr. Kathlen Mody   Ophthalmic Meds Ordered this visit:  Meds ordered this encounter  Medications  . Bevacizumab (AVASTIN) SOLN 1.25 mg   Return in about 9 weeks (around 03/20/2021) for f/u exu ARMD OD, DFE, OCT.  There are no Patient Instructions on file for this visit.  This document serves as a record of services personally performed by Gardiner Sleeper, MD, PhD. It was created on their behalf by Estill Bakes, COT an ophthalmic technician. The creation of this record is the provider's dictation and/or activities during the visit.    Electronically signed by: Estill Bakes, COT 3.7.22 @ 9:56 PM   This document serves as a record of services personally performed by Gardiner Sleeper, MD, PhD. It was created on their behalf by San Jetty. Owens Shark, OA an ophthalmic technician. The creation of this record is the provider's dictation and/or activities during the visit.    Electronically signed by: San Jetty. Marguerita Merles 03.15.2022 9:56 PM  Gardiner Sleeper, M.D., Ph.D. Diseases & Surgery of the Retina and [place] 01/16/2021   I have reviewed the above documentation for accuracy and completeness, and I agree with the above. Gardiner Sleeper, M.D., Ph.D. 01/16/21 9:56 PM   Abbreviations: M  myopia (nearsighted); A astigmatism; H hyperopia (farsighted); P presbyopia; Mrx spectacle prescription;  CTL contact lenses; OD right eye; OS left eye; OU both eyes  XT exotropia; ET esotropia; PEK punctate epithelial keratitis; PEE punctate epithelial erosions; DES dry eye syndrome; MGD meibomian gland dysfunction; ATs artificial tears; PFAT's preservative free artificial tears; [place] nuclear sclerotic cataract; PSC posterior subcapsular cataract; ERM epi-retinal membrane; PVD posterior vitreous detachment; RD retinal detachment; DM diabetes mellitus; DR diabetic retinopathy; NPDR non-proliferative diabetic retinopathy; PDR proliferative diabetic retinopathy; CSME clinically significant macular edema; DME diabetic macular edema; dbh dot blot hemorrhages; CWS cotton wool spot; POAG primary open angle glaucoma; C/D cup-to-disc ratio; HVF humphrey visual field; GVF goldmann visual field; OCT optical coherence tomography; IOP intraocular pressure; BRVO Branch retinal vein occlusion; CRVO central retinal vein occlusion; CRAO central retinal artery occlusion; BRAO branch retinal artery occlusion; RT retinal tear; SB scleral buckle; PPV pars plana vitrectomy; VH Vitreous hemorrhage; PRP panretinal laser photocoagulation; IVK intravitreal kenalog; VMT vitreomacular traction; MH Macular hole;  NVD neovascularization of the disc; NVE neovascularization elsewhere; AREDS age related eye disease study; ARMD age related macular degeneration; POAG primary open angle glaucoma; EBMD epithelial/anterior basement membrane dystrophy; ACIOL anterior chamber intraocular lens; IOL intraocular lens; PCIOL posterior chamber intraocular lens; Phaco/IOL phacoemulsification with intraocular lens placement; [place]  photorefractive keratectomy; LASIK laser assisted in situ keratomileusis; HTN hypertension; DM diabetes mellitus; COPD chronic obstructive pulmonary disease

## 2021-01-16 ENCOUNTER — Encounter (INDEPENDENT_AMBULATORY_CARE_PROVIDER_SITE_OTHER): Payer: Self-pay | Admitting: Ophthalmology

## 2021-01-16 ENCOUNTER — Other Ambulatory Visit: Payer: Self-pay

## 2021-01-16 ENCOUNTER — Ambulatory Visit (INDEPENDENT_AMBULATORY_CARE_PROVIDER_SITE_OTHER): Payer: Medicare Other | Admitting: Ophthalmology

## 2021-01-16 DIAGNOSIS — H353211 Exudative age-related macular degeneration, right eye, with active choroidal neovascularization: Secondary | ICD-10-CM | POA: Diagnosis not present

## 2021-01-16 DIAGNOSIS — I10 Essential (primary) hypertension: Secondary | ICD-10-CM | POA: Diagnosis not present

## 2021-01-16 DIAGNOSIS — H353122 Nonexudative age-related macular degeneration, left eye, intermediate dry stage: Secondary | ICD-10-CM

## 2021-01-16 DIAGNOSIS — H35033 Hypertensive retinopathy, bilateral: Secondary | ICD-10-CM

## 2021-01-16 DIAGNOSIS — H3581 Retinal edema: Secondary | ICD-10-CM

## 2021-01-16 DIAGNOSIS — H25813 Combined forms of age-related cataract, bilateral: Secondary | ICD-10-CM

## 2021-01-16 DIAGNOSIS — H40003 Preglaucoma, unspecified, bilateral: Secondary | ICD-10-CM | POA: Diagnosis not present

## 2021-01-16 MED ORDER — BEVACIZUMAB CHEMO INJECTION 1.25MG/0.05ML SYRINGE FOR KALEIDOSCOPE
1.2500 mg | INTRAVITREAL | Status: AC | PRN
  Administered 2021-01-16: 1.25 mg via INTRAVITREAL

## 2021-01-23 ENCOUNTER — Encounter (INDEPENDENT_AMBULATORY_CARE_PROVIDER_SITE_OTHER): Payer: No Typology Code available for payment source | Admitting: Ophthalmology

## 2021-02-14 ENCOUNTER — Other Ambulatory Visit: Payer: Self-pay | Admitting: Family Medicine

## 2021-03-12 ENCOUNTER — Other Ambulatory Visit: Payer: Self-pay

## 2021-03-12 ENCOUNTER — Encounter: Payer: Self-pay | Admitting: Family Medicine

## 2021-03-12 ENCOUNTER — Ambulatory Visit (INDEPENDENT_AMBULATORY_CARE_PROVIDER_SITE_OTHER): Payer: Medicare Other | Admitting: Family Medicine

## 2021-03-12 VITALS — BP 112/72 | HR 63 | Temp 97.7°F | Resp 18 | Ht 59.0 in | Wt 233.0 lb

## 2021-03-12 DIAGNOSIS — I10 Essential (primary) hypertension: Secondary | ICD-10-CM | POA: Diagnosis not present

## 2021-03-12 DIAGNOSIS — N1831 Chronic kidney disease, stage 3a: Secondary | ICD-10-CM

## 2021-03-12 DIAGNOSIS — M4120 Other idiopathic scoliosis, site unspecified: Secondary | ICD-10-CM

## 2021-03-12 MED ORDER — ZOLPIDEM TARTRATE 10 MG PO TABS: ORAL_TABLET | ORAL | 3 refills | Status: DC

## 2021-03-12 MED ORDER — OXYCODONE-ACETAMINOPHEN 10-325 MG PO TABS: 1.0000 | ORAL_TABLET | ORAL | 0 refills | Status: DC | PRN

## 2021-03-12 NOTE — Progress Notes (Signed)
Subjective:    Patient ID: Faith Reeves, female    DOB: [birth year], 70 y.o.   MRN: [id number]  Since I last saw the patient, her son was admitted to the hospital.  He had an incarcerated inguinal hernia and a bowel obstruction related to this and subsequently developed sepsis.  He was admitted to the hospital from January through March.  However he is now back home and doing relatively well.  The patient states that although she is under stress, she feels that she is doing pretty good.  She takes 1 oxycodone a day due to severe pain in her lower back from severe poliosis.  She is trying to limit her Celebrex to 1 or 2 tablets/week to avoid exacerbating her chronic kidney disease.  Her blood pressure today is well controlled.  She denies any chest pain shortness of breath or dyspnea on exertion.  Past Medical History:  Diagnosis Date  . Allergy   . Anemia   . Anxiety   . Blood transfusion without reported diagnosis   . Cataract   . GERD (gastroesophageal reflux disease)   . Hypertension   . Hypertensive retinopathy    OU  . Macular degeneration    Past Surgical History:  Procedure Laterality Date  . ABDOMINAL HYSTERECTOMY    . ACHILLES TENDON REPAIR Bilateral   . APPENDECTOMY    . BACK SURGERY    . BILATERAL CARPAL TUNNEL RELEASE    . CHOLECYSTECTOMY    . COLONOSCOPY    . HERNIA REPAIR    . JOINT REPLACEMENT    . MELANOMA EXCISION Right 09/15/2017   Procedure: EXCISION LIPOSARCOMA RIGHT ARM;  Surgeon: Georganna Skeans, MD;  Location: [facility];  Service: General;  Laterality: Right;  . SPINE SURGERY     Current Outpatient Medications on File Prior to Visit  Medication Sig Dispense Refill  . albuterol (PROVENTIL HFA;VENTOLIN HFA) 108 (90 Base) MCG/ACT inhaler Inhale 2 puffs into the lungs every 4 (four) hours as needed for wheezing or shortness of breath. 1 Inhaler 0  . amoxicillin (AMOXIL) 500 MG capsule Take 2,000 mg by mouth once. Prior to dental work    . celecoxib (CELEBREX) 200  MG capsule TAKE 1 CAPSULE BY MOUTH EVERY DAY 90 capsule 3  . gabapentin (NEURONTIN) 300 MG capsule TAKE 1 CAPSULE BY MOUTH THREE TIMES A DAY 90 capsule 1  . ipratropium (ATROVENT) 0.06 % nasal spray SPRAY 2 SPRAYS INTO EACH NOSTRIL 4 TIMES A DAY 15 mL 5  . levocetirizine (XYZAL) 5 MG tablet TAKE 1 TABLET BY MOUTH EVERY DAY IN THE EVENING 30 tablet 1  . mometasone (ELOCON) 0.1 % cream APPLY 1 APPLICATION TOPICALLY DAILY AS NEEDED. 45 g 3  . oxyCODONE-acetaminophen (PERCOCET) 10-325 MG tablet Take 1 tablet by mouth every 4 (four) hours as needed for pain (G89.4). 90 tablet 0  . pantoprazole (PROTONIX) 40 MG tablet TAKE 1 TABLET BY MOUTH EVERY DAY 90 tablet 3  . sertraline (ZOLOFT) 50 MG tablet Take 1.5 tablets (75 mg total) by mouth daily. 135 tablet 3  . telmisartan-hydrochlorothiazide (MICARDIS HCT) 80-12.5 MG tablet Take 1 tablet by mouth daily. 90 tablet 3  . valACYclovir (VALTREX) 1000 MG tablet Take 2 tablets (2,000 mg total) by mouth 2 (two) times daily. 4 tablet 2  . zolpidem (AMBIEN) 10 MG tablet TAKE 1 TABLET BY MOUTH EVERY DAY AT BEDTIME AS NEEDED FOR SLEEP 30 tablet 3   Current Facility-Administered Medications on File Prior to Visit  Medication Dose  Route Frequency Provider Last Rate Last Admin  . Bevacizumab (AVASTIN) SOLN 1.25 mg  1.25 mg Intravitreal  Bernarda Caffey, MD   1.25 mg at 01/12/19 1629   Allergies  Allergen Reactions  . Cymbalta [Duloxetine Hcl] Swelling   Social History   Socioeconomic History  . Marital status: Married    Spouse name: Not on file  . Number of children: Not on file  . Years of education: Not on file  . Highest education level: Not on file  Occupational History  . Not on file  Tobacco Use  . Smoking status: Never Smoker  . Smokeless tobacco: Never Used  Vaping Use  . Vaping Use: Never used  Substance and Sexual Activity  . Alcohol use: Yes    Alcohol/week: 1.0 standard drink    Types: 1 Glasses of wine per week  . Drug use: No  . Sexual  activity: Never  Other Topics Concern  . Not on file  Social History Narrative  . Not on file   Social Determinants of Health   Financial Resource Strain: Not on file  Food Insecurity: Not on file  Transportation Needs: Not on file  Physical Activity: Not on file  Stress: Not on file  Social Connections: Not on file  Intimate Partner Violence: Not on file     Review of Systems  All other systems reviewed and are negative.      Objective:   Physical Exam Constitutional:      Appearance: She is obese.  Cardiovascular:     Rate and Rhythm: Normal rate and regular rhythm.     Heart sounds: Normal heart sounds.  Pulmonary:     Effort: Pulmonary effort is normal.     Breath sounds: Normal breath sounds.  Musculoskeletal:     Thoracic back: Scoliosis present.     Lumbar back: Scoliosis present.  Neurological:     Mental Status: She is alert.           Assessment & Plan:  Benign essential HTN - Plan: CBC with Differential/Platelet, COMPLETE METABOLIC PANEL WITH GFR, Lipid panel  Stage 3a chronic kidney disease (HCC)  Other idiopathic scoliosis, unspecified spinal region  I am fine refilling her oxycodone that she takes 1 a day for back pain.  I encouraged the patient to try to limit her NSAIDs is much as possible to avoid exacerbating her chronic kidney disease.  I do not feel that 1 or 2 Celebrex per week is dangerous at this point although I try to encourage her to use more Tylenol and less Celebrex.  Check a CBC and a CMP today along with a fasting lipid panel.  Blood pressure is excellent.  I will also refill her Ambien

## 2021-03-13 LAB — CBC WITH DIFFERENTIAL/PLATELET
Absolute Monocytes: 422 cells/uL (ref 200–950)
Basophils Absolute: 50 cells/uL (ref 0–200)
Basophils Relative: 0.8 %
Eosinophils Absolute: 403 cells/uL (ref 15–500)
Eosinophils Relative: 6.5 %
HCT: 46.6 % — ABNORMAL HIGH (ref 35.0–45.0)
Hemoglobin: 15 g/dL (ref 11.7–15.5)
Lymphs Abs: 1234 cells/uL (ref 850–3900)
MCH: 28.5 pg (ref 27.0–33.0)
MCHC: 32.2 g/dL (ref 32.0–36.0)
MCV: 88.6 fL (ref 80.0–100.0)
MPV: 11.4 fL (ref 7.5–12.5)
Monocytes Relative: 6.8 %
Neutro Abs: 4092 cells/uL (ref 1500–7800)
Neutrophils Relative %: 66 %
Platelets: 210 10*3/uL (ref 140–400)
RBC: 5.26 10*6/uL — ABNORMAL HIGH (ref 3.80–5.10)
RDW: 13.4 % (ref 11.0–15.0)
Total Lymphocyte: 19.9 %
WBC: 6.2 10*3/uL (ref 3.8–10.8)

## 2021-03-13 LAB — LIPID PANEL
Cholesterol: 143 mg/dL (ref <200)
HDL: 59 mg/dL (ref >=50)
LDL Cholesterol (Calc): 69 mg/dL (calc)
Non-HDL Cholesterol (Calc): 84 mg/dL (calc) (ref <130)
Total CHOL/HDL Ratio: 2.4 (calc) (ref <5.0)
Triglycerides: 66 mg/dL (ref <150)

## 2021-03-13 LAB — COMPLETE METABOLIC PANEL WITH GFR
AG Ratio: 1.2 (calc) (ref 1.0–2.5)
ALT: 20 U/L (ref 6–29)
AST: 23 U/L (ref 10–35)
Albumin: 3.8 g/dL (ref 3.6–5.1)
Alkaline phosphatase (APISO): 125 U/L (ref 37–153)
BUN/Creatinine Ratio: 16 (calc) (ref 6–22)
BUN: 19 mg/dL (ref 7–25)
CO2: 27 mmol/L (ref 20–32)
Calcium: 9.5 mg/dL (ref 8.6–10.4)
Chloride: 105 mmol/L (ref 98–110)
Creat: 1.16 mg/dL — ABNORMAL HIGH (ref 0.60–0.93)
GFR, Est African American: 55 mL/min/{1.73_m2} — ABNORMAL LOW (ref >=60)
GFR, Est Non African American: 47 mL/min/{1.73_m2} — ABNORMAL LOW (ref >=60)
Globulin: 3.3 g/dL (calc) (ref 1.9–3.7)
Glucose, Bld: 76 mg/dL (ref 65–99)
Potassium: 4.3 mmol/L (ref 3.5–5.3)
Sodium: 143 mmol/L (ref 135–146)
Total Bilirubin: 0.6 mg/dL (ref 0.2–1.2)
Total Protein: 7.1 g/dL (ref 6.1–8.1)

## 2021-03-14 ENCOUNTER — Encounter: Payer: Self-pay | Admitting: *Deleted

## 2021-03-22 NOTE — Progress Notes (Signed)
[facility] Note  03/27/2021   CHIEF COMPLAINT Patient presents for Retina Follow Up  HISTORY OF PRESENT ILLNESS: Faith Reeves is a 70 y.o. female who presents to the clinic today for:   HPI    Retina Follow Up    Patient presents with  Wet AMD.  In right eye.  This started 9 weeks ago.  Since onset it is stable.  I, the attending physician,  performed the HPI with the patient and updated documentation appropriately.          Comments    Pt here for 9 week ret follow up exu ARMD OD. Pt states vision is about the same. She saw Dr. Kathlen Mody recently who gave her a good report, said her pressures were good. No ocular discomfort or pain reported.        Last edited by Bernarda Caffey, MD on 03/27/2021  4:58 PM. (History)    pt states vision is stable, she recently saw Dr. Kathlen Mody who gave her a good report  Referring physician: Hortencia Pilar, MD [place],  [place] [id number]  HISTORICAL INFORMATION:   Selected notes from the MEDICAL RECORD NUMBER Referred by Dr. Quentin Ore for concern of exu ARMD OU   CURRENT MEDICATIONS: No current outpatient medications on file. (Ophthalmic Drugs)   No current facility-administered medications for this visit. (Ophthalmic Drugs)   Current Outpatient Medications (Other)  Medication Sig  . albuterol (PROVENTIL HFA;VENTOLIN HFA) 108 (90 Base) MCG/ACT inhaler Inhale 2 puffs into the lungs every 4 (four) hours as needed for wheezing or shortness of breath.  Marland Kitchen amoxicillin (AMOXIL) 500 MG capsule Take 2,000 mg by mouth once. Prior to dental work  . celecoxib (CELEBREX) 200 MG capsule TAKE 1 CAPSULE BY MOUTH EVERY DAY  . gabapentin (NEURONTIN) 300 MG capsule TAKE 1 CAPSULE BY MOUTH THREE TIMES A DAY  . ipratropium (ATROVENT) 0.06 % nasal spray SPRAY 2 SPRAYS INTO EACH NOSTRIL 4 TIMES A DAY  . levocetirizine (XYZAL) 5 MG tablet TAKE 1 TABLET BY MOUTH EVERY DAY IN THE EVENING  . mometasone (ELOCON) 0.1  % cream APPLY 1 APPLICATION TOPICALLY DAILY AS NEEDED.  Marland Kitchen oxyCODONE-acetaminophen (PERCOCET) 10-325 MG tablet Take 1 tablet by mouth every 4 (four) hours as needed for pain (G89.4).  . pantoprazole (PROTONIX) 40 MG tablet TAKE 1 TABLET BY MOUTH EVERY DAY  . sertraline (ZOLOFT) 50 MG tablet Take 1.5 tablets (75 mg total) by mouth daily.  Marland Kitchen telmisartan-hydrochlorothiazide (MICARDIS HCT) 80-12.5 MG tablet Take 1 tablet by mouth daily.  . valACYclovir (VALTREX) 1000 MG tablet Take 2 tablets (2,000 mg total) by mouth 2 (two) times daily.  Marland Kitchen zolpidem (AMBIEN) 10 MG tablet TAKE 1 TABLET BY MOUTH EVERY DAY AT BEDTIME AS NEEDED FOR SLEEP   Current Facility-Administered Medications (Other)  Medication Route  . Bevacizumab (AVASTIN) SOLN 1.25 mg Intravitreal      REVIEW OF SYSTEMS: ROS    Positive for: Musculoskeletal, Eyes, Respiratory, Psychiatric   Negative for: Constitutional, Gastrointestinal, Neurological, Skin, Genitourinary, HENT, Endocrine, Cardiovascular, Allergic/Imm, Heme/Lymph   Last edited by Kingsley Spittle, COT on 03/27/2021  1:40 PM. (History)       ALLERGIES Allergies  Allergen Reactions  . Cymbalta [Duloxetine Hcl] Swelling    PAST MEDICAL HISTORY Past Medical History:  Diagnosis Date  . Allergy   . Anemia   . Anxiety   . Blood transfusion without reported diagnosis   . Cataract   .  GERD (gastroesophageal reflux disease)   . Hypertension   . Hypertensive retinopathy    OU  . Macular degeneration    Past Surgical History:  Procedure Laterality Date  . ABDOMINAL HYSTERECTOMY    . ACHILLES TENDON REPAIR Bilateral   . APPENDECTOMY    . BACK SURGERY    . BILATERAL CARPAL TUNNEL RELEASE    . CHOLECYSTECTOMY    . COLONOSCOPY    . HERNIA REPAIR    . JOINT REPLACEMENT    . MELANOMA EXCISION Right 09/15/2017   Procedure: EXCISION LIPOSARCOMA RIGHT ARM;  Surgeon: Georganna Skeans, MD;  Location: [facility];  Service: General;  Laterality: Right;  . SPINE SURGERY       FAMILY HISTORY Family History  Problem Relation Age of Onset  . Hypertension Mother   . Cancer Sister   . Stroke Maternal Grandmother     SOCIAL HISTORY Social History   Tobacco Use  . Smoking status: Never Smoker  . Smokeless tobacco: Never Used  Vaping Use  . Vaping Use: Never used  Substance Use Topics  . Alcohol use: Yes    Alcohol/week: 1.0 standard drink    Types: 1 Glasses of wine per week  . Drug use: No         OPHTHALMIC EXAM:  Base Eye Exam    Visual Acuity (Snellen - Linear)      Right Left   Dist cc 20/40 +2 20/20   Dist ph cc 20/25 -1    Correction: Glasses       Tonometry (Tonopen, 1:47 PM)      Right Left   Pressure 20 21       Pupils      Dark Light Shape React APD   Right 3 2 Round Brisk None   Left 3 2 Round Brisk None       Visual Fields      Left Right    Full Full       Extraocular Movement      Right Left    Full, Ortho Full, Ortho       Neuro/Psych    Oriented x3: Yes   Mood/Affect: Normal       Dilation    Both eyes: 1.0% Mydriacyl, 2.5% Phenylephrine @ 1:47 PM        Slit Lamp and Fundus Exam    Slit Lamp Exam      Right Left   Lids/Lashes Dermatochalasis - upper lid, mild Meibomian gland dysfunction Dermatochalasis - upper lid, mild Meibomian gland dysfunction   Conjunctiva/Sclera White and quiet White and quiet   Cornea 2+ diffuse Punctate epithelial erosions 2+ diffuse Punctate epithelial erosions   Anterior Chamber deep and clear deep and clear   Iris Round and dilated Round and dilated   Lens 2-3+ Nuclear sclerosis, 2-3+ Cortical cataract 2+ Nuclear sclerosis, 2+ Cortical cataract   Vitreous Vitreous syneresis, Posterior vitreous detachment, prominent vitreous condensation over macula Vitreous syneresis, Posterior vitreous detachment       Fundus Exam      Right Left   Disc Pink and Sharp, Compact Pink and Sharp, temporal PPP, Compact   C/D Ratio 0.2 0.1   Macula Blunted foveal reflex, drusen, RPE  mottling, clumping and atrophy, +CNV, stable resolution of central SRF, No heme Flat, good foveal reflex, scattered Drusen, RPE mottling and clumping, focal PEDs   Vessels Vascular attenuation, Tortuous Vascular attenuation, mild tortuousity   Periphery Attached, mild reticular degeneration, no heme Attached, no heme  Refraction    Wearing Rx      Sphere Cylinder Axis Add   Right +2.00 +0.50 008 +2.50   Left +2.50 +1.00 154 +2.50   Type: PAL          IMAGING AND PROCEDURES  Imaging and Procedures for _0 @  OCT, Retina - OU - Both Eyes       Right Eye Quality was good. Central Foveal Thickness: 229. Progression has been stable. Findings include no IRF, retinal drusen , pigment epithelial detachment, outer retinal atrophy, normal foveal contour, no SRF (stable resolution of SRF at 10 week interval ).   Left Eye Quality was good. Central Foveal Thickness: 269. Progression has been stable. Findings include normal foveal contour, no SRF, no IRF, retinal drusen .   Notes *Images captured and stored on drive  Diagnosis / Impression:  OD: exu ARMD -- stable resolution of SRF at 10 week interval OS: non-exu ARMD -- stable   Clinical management:  See below  Abbreviations: NFP - Normal foveal profile. CME - cystoid macular edema. PED - pigment epithelial detachment. IRF - intraretinal fluid. SRF - subretinal fluid. EZ - ellipsoid zone. ERM - epiretinal membrane. ORA - outer retinal atrophy. ORT - outer retinal tubulation. SRHM - subretinal hyper-reflective material        Intravitreal Injection, Pharmacologic Agent - OD - Right Eye       Time Out 03/27/2021. 2:26 PM. Confirmed correct patient, procedure, site, and patient consented.   Anesthesia Topical anesthesia was used. Anesthetic medications included Lidocaine 2%, Proparacaine 0.5%.   Procedure Preparation included 5% betadine to ocular surface, eyelid speculum. A (32g) needle was used.   Injection:  1.25  mg Bevacizumab (AVASTIN) 1.72m/0.05mL SOLN   NDC: 501093-235-57 Lot:: [id number] Expiration date: 05/14/2021   Route: Intravitreal, Site: Right Eye, Waste: 0 mL  Post-op Post injection exam found visual acuity of at least counting fingers. The patient tolerated the procedure well. There were no complications. The patient received written and verbal post procedure care education. Post injection medications were not given.                 ASSESSMENT/PLAN:    ICD-10-CM   1. Exudative age-related macular degeneration of right eye with active choroidal neovascularization (HCC)  H35.3211 Intravitreal Injection, Pharmacologic Agent - OD - Right Eye    Bevacizumab (AVASTIN) SOLN 1.25 mg  2. Retinal edema  H35.81 OCT, Retina - OU - Both Eyes  3. Intermediate stage nonexudative age-related macular degeneration of left eye  H35.3122   4. Essential hypertension  I10   5. Hypertensive retinopathy of both eyes  H35.033   6. Combined forms of age-related cataract of both eyes  H25.813   7. Glaucoma suspect of both eyes  H40.003     1,2. Exudative age related macular degeneration, right eye  - FA (03.10.20) shows +CNV w/ staining/leakage  - s/p IVA OD #1 (03.10.20), #2 (04.13.20), #3 (05.11.20), #4 (06.23.20), #5 (8.18.20), #6 (10.27.20), #7 (01.26.21), #8 (04.26.21), #9 (08.02.21), #10 (09.13.21), #11 (10.11.21), #12 (11.9.21), #13 (12.14.21), #14 (1.24.22), #15 (3.15.22)  - pt was doing very well with q3 mo maintenance injections, but then developed interval increases in central SRF over several visits -- IVA resistance vs interval too long  - OCT today shows stable resolution of SRF at 10 wks  - BCVA stable at 20/25  - FA 09.13.21 shows interval increase in perifoveal leakage OD  - recommend IVA OD #16 today, 05.24.22 -- maintenance w/ ext  to 11 wks  - pt wishes to be treated with IVA OD - consider switching medications if SRF worsens  - RBA of procedure discussed, questions answered  -  informed consent obtained and signed  - see procedure note  - f/u in 11 wks -- DFE/OCT/possible injection, tx and ext as able  3. Age related macular degeneration, non-exudative, left eye  - The incidence, anatomy, and pathology of dry AMD, risk of progression, and the AREDS and AREDS 2 study including smoking risks discussed with patient.  - intermediate stage  - recommend amsler grid monitoring  4,5. Hypertensive retinopathy OU  - discussed importance of tight BP control  - monitor  6. Mixed form age related cataracts OU  - The symptoms of cataract, surgical options, and treatments and risks were discussed with patient.  - discussed diagnosis and progression  - monitor  7. Glaucoma Suspect OU  - IOP 20,21  - under the expert management of Dr. Kathlen Mody   Ophthalmic Meds Ordered this visit:  Meds ordered this encounter  Medications  . Bevacizumab (AVASTIN) SOLN 1.25 mg   Return in about 11 weeks (around 06/12/2021) for f/u exu ARMD OD, DFE, OCT.  There are no Patient Instructions on file for this visit.  This document serves as a record of services personally performed by Gardiner Sleeper, MD, PhD. It was created on their behalf by Estill Bakes, COT an ophthalmic technician. The creation of this record is the provider's dictation and/or activities during the visit.    Electronically signed by: Estill Bakes, COT 5.19.22 @ 5:00 PM   This document serves as a record of services personally performed by Gardiner Sleeper, MD, PhD. It was created on their behalf by San Jetty. Owens Shark, OA an ophthalmic technician. The creation of this record is the provider's dictation and/or activities during the visit.    Electronically signed by: San Jetty. Owens Shark, [place] 05.24.2022 5:00 PM   Gardiner Sleeper, M.D., Ph.D. Diseases & Surgery of the Retina and Vitreous [facility]  I have reviewed the above documentation for accuracy and completeness, and I agree with the above. Gardiner Sleeper, M.D., Ph.D. 03/27/21 5:00 PM   Abbreviations: M myopia (nearsighted); A astigmatism; H hyperopia (farsighted); P presbyopia; Mrx spectacle prescription;  CTL contact lenses; OD right eye; OS left eye; OU both eyes  XT exotropia; ET esotropia; PEK punctate epithelial keratitis; PEE punctate epithelial erosions; DES dry eye syndrome; MGD meibomian gland dysfunction; ATs artificial tears; PFAT's preservative free artificial tears; [place] nuclear sclerotic cataract; PSC posterior subcapsular cataract; ERM epi-retinal membrane; PVD posterior vitreous detachment; RD retinal detachment; DM diabetes mellitus; DR diabetic retinopathy; NPDR non-proliferative diabetic retinopathy; PDR proliferative diabetic retinopathy; CSME clinically significant macular edema; DME diabetic macular edema; dbh dot blot hemorrhages; CWS cotton wool spot; POAG primary open angle glaucoma; C/D cup-to-disc ratio; HVF humphrey visual field; GVF goldmann visual field; OCT optical coherence tomography; IOP intraocular pressure; BRVO Branch retinal vein occlusion; CRVO central retinal vein occlusion; CRAO central retinal artery occlusion; BRAO branch retinal artery occlusion; RT retinal tear; SB scleral buckle; PPV pars plana vitrectomy; VH Vitreous hemorrhage; PRP panretinal laser photocoagulation; IVK intravitreal kenalog; VMT vitreomacular traction; MH Macular hole;  NVD neovascularization of the disc; NVE neovascularization elsewhere; AREDS age related eye disease study; ARMD age related macular degeneration; POAG primary open angle glaucoma; EBMD epithelial/anterior basement membrane dystrophy; ACIOL anterior chamber intraocular lens; IOL intraocular lens; PCIOL posterior chamber intraocular lens; Phaco/IOL phacoemulsification with  intraocular lens placement; [place] photorefractive keratectomy; LASIK laser assisted in situ keratomileusis; HTN hypertension; DM diabetes mellitus; COPD chronic obstructive pulmonary disease

## 2021-03-26 ENCOUNTER — Encounter (INDEPENDENT_AMBULATORY_CARE_PROVIDER_SITE_OTHER): Payer: Medicare Other | Admitting: Ophthalmology

## 2021-03-26 DIAGNOSIS — H40033 Anatomical narrow angle, bilateral: Secondary | ICD-10-CM | POA: Diagnosis not present

## 2021-03-26 DIAGNOSIS — H40013 Open angle with borderline findings, low risk, bilateral: Secondary | ICD-10-CM | POA: Diagnosis not present

## 2021-03-27 ENCOUNTER — Ambulatory Visit (INDEPENDENT_AMBULATORY_CARE_PROVIDER_SITE_OTHER): Payer: Medicare Other | Admitting: Ophthalmology

## 2021-03-27 ENCOUNTER — Other Ambulatory Visit: Payer: Self-pay

## 2021-03-27 ENCOUNTER — Encounter (INDEPENDENT_AMBULATORY_CARE_PROVIDER_SITE_OTHER): Payer: Self-pay | Admitting: Ophthalmology

## 2021-03-27 DIAGNOSIS — H25813 Combined forms of age-related cataract, bilateral: Secondary | ICD-10-CM

## 2021-03-27 DIAGNOSIS — H353122 Nonexudative age-related macular degeneration, left eye, intermediate dry stage: Secondary | ICD-10-CM | POA: Diagnosis not present

## 2021-03-27 DIAGNOSIS — I10 Essential (primary) hypertension: Secondary | ICD-10-CM

## 2021-03-27 DIAGNOSIS — H353211 Exudative age-related macular degeneration, right eye, with active choroidal neovascularization: Secondary | ICD-10-CM

## 2021-03-27 DIAGNOSIS — H3581 Retinal edema: Secondary | ICD-10-CM

## 2021-03-27 DIAGNOSIS — H35033 Hypertensive retinopathy, bilateral: Secondary | ICD-10-CM | POA: Diagnosis not present

## 2021-03-27 DIAGNOSIS — H40003 Preglaucoma, unspecified, bilateral: Secondary | ICD-10-CM

## 2021-03-27 MED ORDER — BEVACIZUMAB CHEMO INJECTION 1.25MG/0.05ML SYRINGE FOR KALEIDOSCOPE
1.2500 mg | INTRAVITREAL | Status: AC | PRN
  Administered 2021-03-27: 1.25 mg via INTRAVITREAL

## 2021-04-13 ENCOUNTER — Other Ambulatory Visit: Payer: Self-pay | Admitting: *Deleted

## 2021-04-13 MED ORDER — GABAPENTIN 300 MG PO CAPS: ORAL_CAPSULE | ORAL | 1 refills | Status: DC

## 2021-04-27 ENCOUNTER — Other Ambulatory Visit: Payer: Self-pay | Admitting: Family Medicine

## 2021-04-27 ENCOUNTER — Ambulatory Visit (INDEPENDENT_AMBULATORY_CARE_PROVIDER_SITE_OTHER): Payer: Medicare Other

## 2021-04-27 ENCOUNTER — Other Ambulatory Visit: Payer: Self-pay

## 2021-04-27 VITALS — BP 110/72 | HR 75 | Temp 97.1°F | Ht 59.0 in | Wt 221.1 lb

## 2021-04-27 DIAGNOSIS — Z0000 Encounter for general adult medical examination without abnormal findings: Secondary | ICD-10-CM

## 2021-04-27 MED ORDER — SERTRALINE HCL 50 MG PO TABS: 75.0000 mg | ORAL_TABLET | Freq: Every day | ORAL | 3 refills | Status: DC

## 2021-04-27 NOTE — Progress Notes (Signed)
Subjective:   Faith Reeves is a 70 y.o. female who presents for Medicare Annual (Subsequent) preventive examination.  Review of Systems    N/A  Cardiac Risk Factors include: advanced age (>31men, >24 women);obesity (BMI >30kg/m2)     Objective:    Today's Vitals   04/27/21 1414 04/27/21 1415  BP: 110/72   Pulse: 75   Temp: (!) 97.1 F (36.2 C)   TempSrc: Temporal   SpO2: 97%   Weight: 221 lb 2 oz (100.3 kg)   Height: 4\' 11"  (1.499 m)   PainSc:  6    Body mass index is 44.66 kg/m.  Advanced Directives 04/27/2021 09/15/2017  Does Patient Have a Medical Advance Directive? No Yes  Type of Advance Directive - [place]  Does patient want to make changes to medical advance directive? - No - Patient declined  Copy of [place] in Chart? - No - copy requested  Would patient like information on creating a medical advance directive? No - Patient declined -    Current Medications (verified) Outpatient Encounter Medications as of 04/27/2021  Medication Sig   albuterol (PROVENTIL HFA;VENTOLIN HFA) 108 (90 Base) MCG/ACT inhaler Inhale 2 puffs into the lungs every 4 (four) hours as needed for wheezing or shortness of breath.   celecoxib (CELEBREX) 200 MG capsule TAKE 1 CAPSULE BY MOUTH EVERY DAY   gabapentin (NEURONTIN) 300 MG capsule TAKE 1 CAPSULE BY MOUTH THREE TIMES A DAY   ipratropium (ATROVENT) 0.06 % nasal spray SPRAY 2 SPRAYS INTO EACH NOSTRIL 4 TIMES A DAY   mometasone (ELOCON) 0.1 % cream APPLY 1 APPLICATION TOPICALLY DAILY AS NEEDED.   oxyCODONE-acetaminophen (PERCOCET) 10-325 MG tablet Take 1 tablet by mouth every 4 (four) hours as needed for pain (G89.4).   pantoprazole (PROTONIX) 40 MG tablet TAKE 1 TABLET BY MOUTH EVERY DAY   sertraline (ZOLOFT) 50 MG tablet Take 1.5 tablets (75 mg total) by mouth daily.   telmisartan-hydrochlorothiazide (MICARDIS HCT) 80-12.5 MG tablet Take 1 tablet by mouth daily.   zolpidem (AMBIEN) 10 MG tablet  TAKE 1 TABLET BY MOUTH EVERY DAY AT BEDTIME AS NEEDED FOR SLEEP   amoxicillin (AMOXIL) 500 MG capsule Take 2,000 mg by mouth once. Prior to dental work (Patient not taking: Reported on 04/27/2021)   levocetirizine (XYZAL) 5 MG tablet TAKE 1 TABLET BY MOUTH EVERY DAY IN THE EVENING (Patient not taking: Reported on 04/27/2021)   valACYclovir (VALTREX) 1000 MG tablet Take 2 tablets (2,000 mg total) by mouth 2 (two) times daily. (Patient not taking: Reported on 04/27/2021)   Facility-Administered Encounter Medications as of 04/27/2021  Medication   Bevacizumab (AVASTIN) SOLN 1.25 mg    Allergies (verified) Cymbalta [duloxetine hcl]   History: Past Medical History:  Diagnosis Date   Allergy    Anemia    Anxiety    Blood transfusion without reported diagnosis    Cataract    GERD (gastroesophageal reflux disease)    Hypertension    Hypertensive retinopathy    OU   Macular degeneration    Past Surgical History:  Procedure Laterality Date   ABDOMINAL HYSTERECTOMY     ACHILLES TENDON REPAIR Bilateral    APPENDECTOMY     BACK SURGERY     BILATERAL CARPAL TUNNEL RELEASE     CHOLECYSTECTOMY     COLONOSCOPY     HERNIA REPAIR     JOINT REPLACEMENT     MELANOMA EXCISION Right 09/15/2017   Procedure: EXCISION LIPOSARCOMA RIGHT ARM;  Surgeon: Georganna Skeans, MD;  Location: [facility];  Service: General;  Laterality: Right;   SPINE SURGERY     Family History  Problem Relation Age of Onset   Hypertension Mother    Cancer Sister    Stroke Maternal Grandmother    Social History   Socioeconomic History   Marital status: Married    Spouse name: Not on file   Number of children: Not on file   Years of education: Not on file   Highest education level: Not on file  Occupational History   Not on file  Tobacco Use   Smoking status: Never   Smokeless tobacco: Never  Vaping Use   Vaping Use: Never used  Substance and Sexual Activity   Alcohol use: Yes    Alcohol/week: 1.0 standard drink     Types: 1 Glasses of wine per week   Drug use: No   Sexual activity: Never  Other Topics Concern   Not on file  Social History Narrative   Not on file   Social Determinants of Health   Financial Resource Strain: Low Risk    Difficulty of Paying Living Expenses: Not hard at all  Food Insecurity: No Food Insecurity   Worried About Charity fundraiser in the Last Year: Never true   [place] in the Last Year: Never true  Transportation Needs: No Transportation Needs   Lack of Transportation (Medical): No   Lack of Transportation (Non-Medical): No  Physical Activity: Inactive   Days of Exercise per Week: 0 days   Minutes of Exercise per Session: 0 min  Stress: No Stress Concern Present   Feeling of Stress : Not at all  Social Connections: Moderately Isolated   Frequency of Communication with Friends and Family: More than three times a week   Frequency of Social Gatherings with Friends and Family: Once a week   Attends Religious Services: Never   Marine scientist or Organizations: No   Attends Music therapist: Never   Marital Status: Married    Tobacco Counseling Counseling given: Not Answered   Clinical Intake:  Pre-visit preparation completed: Yes  Pain : 0-10 Pain Score: 6  Pain Type: Chronic pain Pain Location: Back (Shoulders) Pain Orientation: Upper Pain Onset: More than a month ago Pain Frequency: Constant     Nutritional Risks: Nausea/ vomitting/ diarrhea (Nausea) Diabetes: No  How often do you need to have someone help you when you read instructions, pamphlets, or other written materials from your doctor or pharmacy?: 1 - Never  Diabetic?No   Interpreter Needed?: No  Information entered by :: [place] of Daily Living In your present state of health, do you have any difficulty performing the following activities: 04/27/2021 03/12/2021  Hearing? N N  Vision? N N  Difficulty concentrating or making decisions? N N   Walking or climbing stairs? Y N  Dressing or bathing? N N  Doing errands, shopping? Y N  Comment Patient no longer drives -  Preparing Food and eating ? N -  Using the Toilet? N -  In the past six months, have you accidently leaked urine? N -  Do you have problems with loss of bowel control? N -  Managing your Medications? N -  Managing your Finances? N -  Housekeeping or managing your Housekeeping? N -  Some recent data might be hidden    Patient Care Team: Susy Frizzle, MD as PCP - General (Family Medicine)  Indicate any recent Medical Services you may have received from other than [facility] providers in the past year (date may be approximate).     Assessment:   This is a routine wellness examination for Maylynn.  Hearing/Vision screen Vision Screening - Comments:: Patient states gets eye exams once every 3 months. Has hx of macular degeneration. Currently wears glasses   Dietary issues and exercise activities discussed: Current Exercise Habits: Home exercise routine, Type of exercise: strength training/weights;stretching, Time (Minutes): 10, Frequency (Times/Week): 7, Weekly Exercise (Minutes/Week): 70, Intensity: Mild   Goals Addressed             This Visit's Progress    Weight (lb) < 200 lb (90.7 kg)   221 lb 2 oz (100.3 kg)      Depression Screen PHQ 2/9 Scores 04/27/2021 10/18/2019 09/08/2018 01/12/2018 11/21/2017 08/18/2017 06/14/2015  PHQ - 2 Score 2 0 0 0 2 3 0  PHQ- 9 Score 2 - - - - 4 -    Fall Risk Fall Risk  04/27/2021 03/12/2021 09/24/2019 09/08/2018 08/18/2017  Falls in the past year? 0 0 0 0 No  Comment - - Emmi Telephone Survey: data to providers prior to load - -  Number falls in past yr: 0 0 - - -  Injury with Fall? 0 0 - - -  Follow up Falls evaluation completed;Falls prevention discussed Falls evaluation completed - Falls evaluation completed -    FALL RISK PREVENTION PERTAINING TO THE HOME:  Any stairs in or around the home? No  If so, are there  any without handrails? No  Home free of loose throw rugs in walkways, pet beds, electrical cords, etc? Yes  Adequate lighting in your home to reduce risk of falls? Yes   ASSISTIVE DEVICES UTILIZED TO PREVENT FALLS:  Life alert? No  Use of a cane, walker or w/c? Yes  Grab bars in the bathroom? Yes  Shower chair or bench in shower? No  Elevated toilet seat or a handicapped toilet? No   TIMED UP AND GO:  Was the test performed? Yes .  Length of time to ambulate 10 feet: 5 sec.   Gait slow and steady with assistive device  Cognitive Function:  Normal cognitive status assessed by direct observation by this Nurse Health Advisor. No abnormalities found.        Immunizations Immunization History  Administered Date(s) Administered   Fluad Quad(high Dose 65+) 10/18/2019, 08/01/2020   Influenza,inj,Quad PF,6+ Mos 08/06/2016, 08/18/2017, 08/31/2018   PFIZER(Purple Top)SARS-COV-2 Vaccination 09/21/2020   Pneumococcal Conjugate-13 09/08/2018   Pneumococcal Polysaccharide-23 11/05/2015    TDAP status: Due, Education has been provided regarding the importance of this vaccine. Advised may receive this vaccine at local pharmacy or Health Dept. Aware to provide a copy of the vaccination record if obtained from local pharmacy or Health Dept. Verbalized acceptance and understanding.  Flu Vaccine status: Up to date  Pneumococcal vaccine status: Up to date  Covid-19 vaccine status: Completed vaccines  Qualifies for Shingles Vaccine? Yes   Zostavax completed No   Shingrix Completed?: No.    Education has been provided regarding the importance of this vaccine. Patient has been advised to call insurance company to determine out of pocket expense if they have not yet received this vaccine. Advised may also receive vaccine at local pharmacy or Health Dept. Verbalized acceptance and understanding.  Screening Tests Health Maintenance  Topic Date Due   TETANUS/TDAP  Never done   Zoster Vaccines-  Shingrix (1 of 2)  Never done   COVID-19 Vaccine (2 - Pfizer series) 10/12/2020   COLONOSCOPY (Pts 45-28yrs Insurance coverage will need to be confirmed)  11/04/2020   MAMMOGRAM  12/15/2020   INFLUENZA VACCINE  06/04/2021   DEXA SCAN  Completed   Hepatitis C Screening  Completed   PNA vac Low Risk Adult  Completed   HPV VACCINES  Aged Out    Health Maintenance  Health Maintenance Due  Topic Date Due   TETANUS/TDAP  Never done   Zoster Vaccines- Shingrix (1 of 2) Never done   COVID-19 Vaccine (2 - Pfizer series) 10/12/2020   COLONOSCOPY (Pts 45-4yrs Insurance coverage will need to be confirmed)  11/04/2020   MAMMOGRAM  12/15/2020    Colorectal cancer screening: Referral to GI placed 04/27/2021. Pt aware the office will call re: appt.  Mammogram status: Ordered 04/27/2021. Pt provided with contact info and advised to call to schedule appt.   Bone Density status: Completed 11/18/2018. Results reflect: Bone density results: OSTEOPENIA. Repeat every 5 years.  Lung Cancer Screening: (Low Dose CT Chest recommended if Age 70-80 years, 30 pack-year currently smoking OR have quit w/in 15years.) does not qualify.   Lung Cancer Screening Referral: N/A   Additional Screening:  Hepatitis C Screening: does qualify; Completed 08/20/2017  Vision Screening: Recommended annual ophthalmology exams for early detection of glaucoma and other disorders of the eye. Is the patient up to date with their annual eye exam?  Yes  Who is the provider or what is the name of the office in which the patient attends annual eye exams? Dr. Coralyn Pear If pt is not established with a provider, would they like to be referred to a provider to establish care? No .   Dental Screening: Recommended annual dental exams for proper oral hygiene  Community Resource Referral / Chronic Care Management: CRR required this visit?  No   CCM required this visit?  No      Plan:     I have personally reviewed and noted the  following in the patient's chart:   Medical and social history Use of alcohol, tobacco or illicit drugs  Current medications and supplements including opioid prescriptions.  Functional ability and status Nutritional status Physical activity Advanced directives List of other physicians Hospitalizations, surgeries, and ER visits in previous 12 months Vitals Screenings to include cognitive, depression, and falls Referrals and appointments  In addition, I have reviewed and discussed with patient certain preventive protocols, quality metrics, and best practice recommendations. A written personalized care plan for preventive services as well as general preventive health recommendations were provided to patient.     Ofilia Neas, LPN   [id number]   Nurse Notes: None

## 2021-04-27 NOTE — Patient Instructions (Signed)
Faith Reeves , Thank you for taking time to come for your Medicare Wellness Visit. I appreciate your ongoing commitment to your health goals. Please review the following plan we discussed and let me know if I can assist you in the future.   Screening recommendations/referrals: Colonoscopy: Currently due, cologuard orders placed  Mammogram: Currently due orders  placed  Bone Density: Up to date. Next due 11/19/2023 Recommended yearly ophthalmology/optometry visit for glaucoma screening and checkup Recommended yearly dental visit for hygiene and checkup  Vaccinations: Influenza vaccine: Up to date, nxt due fall 2022  Pneumococcal vaccine: Completed series  Tdap vaccine: Currently due, you may await and injury Shingles vaccine: Currently due, if you would like to receive we recommend that you do so at your local pharmacy    Advanced directives: Advance directive discussed with you today. Even though you declined this today please call our office should you change your mind and we can give you the proper paperwork for you to fill out.   Conditions/risks identified: None   Next appointment: None    Preventive Care 65 Years and Older, Female Preventive care refers to lifestyle choices and visits with your health care provider that can promote health and wellness. What does preventive care include? A yearly physical exam. This is also called an annual well check. Dental exams once or twice a year. Routine eye exams. Ask your health care provider how often you should have your eyes checked. Personal lifestyle choices, including: Daily care of your teeth and gums. Regular physical activity. Eating a healthy diet. Avoiding tobacco and drug use. Limiting alcohol use. Practicing safe sex. Taking low-dose aspirin every day. Taking vitamin and mineral supplements as recommended by your health care provider. What happens during an annual well check? The services and screenings done by your  health care provider during your annual well check will depend on your age, overall health, lifestyle risk factors, and family history of disease. Counseling  Your health care provider may ask you questions about your: Alcohol use. Tobacco use. Drug use. Emotional well-being. Home and relationship well-being. Sexual activity. Eating habits. History of falls. Memory and ability to understand (cognition). Work and work Statistician. Reproductive health. Screening  You may have the following tests or measurements: Height, weight, and BMI. Blood pressure. Lipid and cholesterol levels. These may be checked every 5 years, or more frequently if you are over 73 years old. Skin check. Lung cancer screening. You may have this screening every year starting at age 58 if you have a 30-pack-year history of smoking and currently smoke or have quit within the past 15 years. Fecal occult blood test (FOBT) of the stool. You may have this test every year starting at age 69. Flexible sigmoidoscopy or colonoscopy. You may have a sigmoidoscopy every 5 years or a colonoscopy every 10 years starting at age 69. Hepatitis C blood test. Hepatitis B blood test. Sexually transmitted disease (STD) testing. Diabetes screening. This is done by checking your blood sugar (glucose) after you have not eaten for a while (fasting). You may have this done every 1-3 years. Bone density scan. This is done to screen for osteoporosis. You may have this done starting at age 22. Mammogram. This may be done every 1-2 years. Talk to your health care provider about how often you should have regular mammograms. Talk with your health care provider about your test results, treatment options, and if necessary, the need for more tests. Vaccines  Your health care provider may recommend  certain vaccines, such as: Influenza vaccine. This is recommended every year. Tetanus, diphtheria, and acellular pertussis (Tdap, Td) vaccine. You may  need a Td booster every 10 years. Zoster vaccine. You may need this after age 55. Pneumococcal 13-valent conjugate (PCV13) vaccine. One dose is recommended after age 15. Pneumococcal polysaccharide (PPSV23) vaccine. One dose is recommended after age 36. Talk to your health care provider about which screenings and vaccines you need and how often you need them. This information is not intended to replace advice given to you by your health care provider. Make sure you discuss any questions you have with your health care provider. Document Released: 11/17/2015 Document Revised: 07/10/2016 Document Reviewed: 08/22/2015 Elsevier Interactive Patient Education  2017 [place] Prevention in the Home Falls can cause injuries. They can happen to people of all ages. There are many things you can do to make your home safe and to help prevent falls. What can I do on the outside of my home? Regularly fix the edges of walkways and driveways and fix any cracks. Remove anything that might make you trip as you walk through a door, such as a raised step or threshold. Trim any bushes or trees on the path to your home. Use bright outdoor lighting. Clear any walking paths of anything that might make someone trip, such as rocks or tools. Regularly check to see if handrails are loose or broken. Make sure that both sides of any steps have handrails. Any raised decks and porches should have guardrails on the edges. Have any leaves, snow, or ice cleared regularly. Use sand or salt on walking paths during winter. Clean up any spills in your garage right away. This includes oil or grease spills. What can I do in the bathroom? Use night lights. Install grab bars by the toilet and in the tub and shower. Do not use towel bars as grab bars. Use non-skid mats or decals in the tub or shower. If you need to sit down in the shower, use a plastic, non-slip stool. Keep the floor dry. Clean up any water that spills on  the floor as soon as it happens. Remove soap buildup in the tub or shower regularly. Attach bath mats securely with double-sided non-slip rug tape. Do not have throw rugs and other things on the floor that can make you trip. What can I do in the bedroom? Use night lights. Make sure that you have a light by your bed that is easy to reach. Do not use any sheets or blankets that are too big for your bed. They should not hang down onto the floor. Have a firm chair that has side arms. You can use this for support while you get dressed. Do not have throw rugs and other things on the floor that can make you trip. What can I do in the kitchen? Clean up any spills right away. Avoid walking on wet floors. Keep items that you use a lot in easy-to-reach places. If you need to reach something above you, use a strong step stool that has a grab bar. Keep electrical cords out of the way. Do not use floor polish or wax that makes floors slippery. If you must use wax, use non-skid floor wax. Do not have throw rugs and other things on the floor that can make you trip. What can I do with my stairs? Do not leave any items on the stairs. Make sure that there are handrails on both sides of the  stairs and use them. Fix handrails that are broken or loose. Make sure that handrails are as long as the stairways. Check any carpeting to make sure that it is firmly attached to the stairs. Fix any carpet that is loose or worn. Avoid having throw rugs at the top or bottom of the stairs. If you do have throw rugs, attach them to the floor with carpet tape. Make sure that you have a light switch at the top of the stairs and the bottom of the stairs. If you do not have them, ask someone to add them for you. What else can I do to help prevent falls? Wear shoes that: Do not have high heels. Have rubber bottoms. Are comfortable and fit you well. Are closed at the toe. Do not wear sandals. If you use a stepladder: Make sure  that it is fully opened. Do not climb a closed stepladder. Make sure that both sides of the stepladder are locked into place. Ask someone to hold it for you, if possible. Clearly mark and make sure that you can see: Any grab bars or handrails. First and last steps. Where the edge of each step is. Use tools that help you move around (mobility aids) if they are needed. These include: Canes. Walkers. Scooters. Crutches. Turn on the lights when you go into a dark area. Replace any light bulbs as soon as they burn out. Set up your furniture so you have a clear path. Avoid moving your furniture around. If any of your floors are uneven, fix them. If there are any pets around you, be aware of where they are. Review your medicines with your doctor. Some medicines can make you feel dizzy. This can increase your chance of falling. Ask your doctor what other things that you can do to help prevent falls. This information is not intended to replace advice given to you by your health care provider. Make sure you discuss any questions you have with your health care provider. Document Released: 08/17/2009 Document Revised: 03/28/2016 Document Reviewed: 11/25/2014 Elsevier Interactive Patient Education  2017 [employer].

## 2021-06-06 ENCOUNTER — Other Ambulatory Visit: Payer: Self-pay

## 2021-06-07 MED ORDER — OXYCODONE-ACETAMINOPHEN 10-325 MG PO TABS: 1.0000 | ORAL_TABLET | ORAL | 0 refills | Status: DC | PRN

## 2021-06-11 NOTE — Progress Notes (Signed)
[facility] Note  06/12/2021   CHIEF COMPLAINT Patient presents for Retina Follow Up  HISTORY OF PRESENT ILLNESS: Faith Reeves is a 70 y.o. female who presents to the clinic today for:   HPI     Retina Follow Up   Patient presents with  Wet AMD.  In right eye.  Duration of 11 weeks.  Since onset it is stable.  I, the attending physician,  performed the HPI with the patient and updated documentation appropriately.        Comments   11 week follow up Exu ARMD OD- Once in awhile she will see a black dot that floats around in vision.  It is like the one she sees after her injection but it is hanging around longer this time.  Denies FOLs or decrease in vision.       Last edited by Bernarda Caffey, MD on 06/13/2021 10:20 PM.     pt states vision is stable, she recently saw Dr. Kathlen Mody who gave her a good report  Referring physician: Susy Frizzle, MD [id number] [facility] [place],  [place] [address]  HISTORICAL INFORMATION:   Selected notes from the MEDICAL RECORD NUMBER Referred by Dr. Quentin Ore for concern of exu ARMD OU   CURRENT MEDICATIONS: No current outpatient medications on file. (Ophthalmic Drugs)   No current facility-administered medications for this visit. (Ophthalmic Drugs)   Current Outpatient Medications (Other)  Medication Sig   albuterol (PROVENTIL HFA;VENTOLIN HFA) 108 (90 Base) MCG/ACT inhaler Inhale 2 puffs into the lungs every 4 (four) hours as needed for wheezing or shortness of breath.   celecoxib (CELEBREX) 200 MG capsule TAKE 1 CAPSULE BY MOUTH EVERY DAY   gabapentin (NEURONTIN) 300 MG capsule TAKE 1 CAPSULE BY MOUTH THREE TIMES A DAY   ipratropium (ATROVENT) 0.06 % nasal spray SPRAY 2 SPRAYS INTO EACH NOSTRIL 4 TIMES A DAY   levocetirizine (XYZAL) 5 MG tablet TAKE 1 TABLET BY MOUTH EVERY DAY IN THE EVENING   mometasone (ELOCON) 0.1 % cream APPLY 1 APPLICATION TOPICALLY DAILY AS NEEDED.   oxyCODONE-acetaminophen (PERCOCET)  10-325 MG tablet Take 1 tablet by mouth every 4 (four) hours as needed for pain (G89.4).   pantoprazole (PROTONIX) 40 MG tablet TAKE 1 TABLET BY MOUTH EVERY DAY   sertraline (ZOLOFT) 50 MG tablet Take 1.5 tablets (75 mg total) by mouth daily.   telmisartan-hydrochlorothiazide (MICARDIS HCT) 80-12.5 MG tablet Take 1 tablet by mouth daily.   zolpidem (AMBIEN) 10 MG tablet TAKE 1 TABLET BY MOUTH EVERY DAY AT BEDTIME AS NEEDED FOR SLEEP   amoxicillin (AMOXIL) 500 MG capsule Take 2,000 mg by mouth once. Prior to dental work (Patient not taking: No sig reported)   valACYclovir (VALTREX) 1000 MG tablet Take 2 tablets (2,000 mg total) by mouth 2 (two) times daily. (Patient not taking: No sig reported)   Current Facility-Administered Medications (Other)  Medication Route   Bevacizumab (AVASTIN) SOLN 1.25 mg Intravitreal      REVIEW OF SYSTEMS: ROS   Positive for: Musculoskeletal, Eyes, Respiratory, Psychiatric Negative for: Constitutional, Gastrointestinal, Neurological, Skin, Genitourinary, HENT, Endocrine, Cardiovascular, Allergic/Imm, Heme/Lymph Last edited by Leonie Douglas, COA on 06/12/2021  1:21 PM.        ALLERGIES Allergies  Allergen Reactions   Cymbalta [Duloxetine Hcl] Swelling    PAST MEDICAL HISTORY Past Medical History:  Diagnosis Date   Allergy    Anemia    Anxiety    Blood transfusion without  reported diagnosis    Cataract    GERD (gastroesophageal reflux disease)    Hypertension    Hypertensive retinopathy    OU   Macular degeneration    Past Surgical History:  Procedure Laterality Date   ABDOMINAL HYSTERECTOMY     ACHILLES TENDON REPAIR Bilateral    APPENDECTOMY     BACK SURGERY     BILATERAL CARPAL TUNNEL RELEASE     CHOLECYSTECTOMY     COLONOSCOPY     HERNIA REPAIR     JOINT REPLACEMENT     MELANOMA EXCISION Right 09/15/2017   Procedure: EXCISION LIPOSARCOMA RIGHT ARM;  Surgeon: Georganna Skeans, MD;  Location: [facility];  Service: General;  Laterality:  Right;   SPINE SURGERY      FAMILY HISTORY Family History  Problem Relation Age of Onset   Hypertension Mother    Cancer Sister    Stroke Maternal Grandmother     SOCIAL HISTORY Social History   Tobacco Use   Smoking status: Never   Smokeless tobacco: Never  Vaping Use   Vaping Use: Never used  Substance Use Topics   Alcohol use: Yes    Alcohol/week: 1.0 standard drink    Types: 1 Glasses of wine per week   Drug use: No         OPHTHALMIC EXAM:  Base Eye Exam     Visual Acuity (Snellen - Linear)       Right Left   Dist cc 20/30 -2 20/20   Dist ph cc 20/30          Tonometry (Tonopen, 1:28 PM)       Right Left   Pressure 16 17         Pupils       Dark Light Shape React APD   Right 3 2 Round Brisk None   Left 3 2 Round Brisk None         Visual Fields (Counting fingers)       Left Right    Full Full         Extraocular Movement       Right Left    Full Full         Neuro/Psych     Oriented x3: Yes   Mood/Affect: Normal         Dilation     Both eyes: 1.0% Mydriacyl, 2.5% Phenylephrine @ 1:28 PM           Slit Lamp and Fundus Exam     Slit Lamp Exam       Right Left   Lids/Lashes Dermatochalasis - upper lid, mild Meibomian gland dysfunction Dermatochalasis - upper lid, mild Meibomian gland dysfunction   Conjunctiva/Sclera White and quiet White and quiet   Cornea 2-3+ diffuse Punctate epithelial erosions, decreased TBUT 2+ diffuse Punctate epithelial erosions, decrease TBUT   Anterior Chamber deep and clear deep and clear   Iris Round and dilated Round and dilated   Lens 2-3+ Nuclear sclerosis, 2-3+ Cortical cataract 2+ Nuclear sclerosis, 2+ Cortical cataract   Vitreous Vitreous syneresis, Posterior vitreous detachment, prominent vitreous condensation over macula Vitreous syneresis, Posterior vitreous detachment         Fundus Exam       Right Left   Disc Pink and Sharp, Compact Pink and Sharp, temporal PPP,  Compact   C/D Ratio 0.2 0.1   Macula Blunted foveal reflex, drusen, RPE mottling, clumping and atrophy, +CNV,interval re-development of  inSRF, No heme Flat,  good foveal reflex, scattered Drusen, RPE mottling and clumping, focal PEDs, No heme or edema   Vessels Vascular attenuation, Tortuous Vascular attenuation, mild tortuousity   Periphery Attached, mild reticular degeneration, no heme Attached, no heme           Refraction     Wearing Rx       Sphere Cylinder Axis Add   Right +2.00 +0.50 008 +2.50   Left +2.50 +1.00 154 +2.50    Type: PAL            IMAGING AND PROCEDURES  Imaging and Procedures for '@TODAY'$ @  OCT, Retina - OU - Both Eyes       Right Eye Quality was good. Central Foveal Thickness: 237. Progression has worsened. Findings include no IRF, retinal drusen , pigment epithelial detachment, outer retinal atrophy, normal foveal contour, subretinal fluid (Interval re-development of SRF at 11 week interval ).   Left Eye Quality was good. Central Foveal Thickness: 268. Progression has been stable. Findings include normal foveal contour, no SRF, no IRF, retinal drusen .   Notes *Images captured and stored on drive  Diagnosis / Impression:  OD: exu ARMD--Interval re-development of SRF at 11 week interval OS: non-exu ARMD -- stable   Clinical management:  See below  Abbreviations: NFP - Normal foveal profile. CME - cystoid macular edema. PED - pigment epithelial detachment. IRF - intraretinal fluid. SRF - subretinal fluid. EZ - ellipsoid zone. ERM - epiretinal membrane. ORA - outer retinal atrophy. ORT - outer retinal tubulation. SRHM - subretinal hyper-reflective material      Intravitreal Injection, Pharmacologic Agent - OD - Right Eye       Time Out 06/12/2021. 1:52 PM. Confirmed correct patient, procedure, site, and patient consented.   Anesthesia Topical anesthesia was used. Anesthetic medications included Lidocaine 2%, Proparacaine 0.5%.    Procedure Preparation included 5% betadine to ocular surface, eyelid speculum. A supplied needle was used.   Injection: 1.25 mg Bevacizumab 1.'25mg'$ /0.20m   Route: Intravitreal, Site: Right Eye   NDC: [id number] Expiration date: 07/28/2021, Waste: 0 mL   Post-op Post injection exam found visual acuity of at least counting fingers. The patient tolerated the procedure well. There were no complications. The patient received written and verbal post procedure care education. Post injection medications were not given.            ASSESSMENT/PLAN:    ICD-10-CM   1. Exudative age-related macular degeneration of right eye with active choroidal neovascularization (HCC)  H35.3211 Intravitreal Injection, Pharmacologic Agent - OD - Right Eye    Bevacizumab (AVASTIN) SOLN 1.25 mg    2. Retinal edema  H35.81 OCT, Retina - OU - Both Eyes    3. Intermediate stage nonexudative age-related macular degeneration of left eye  H35.3122     4. Essential hypertension  I10     5. Hypertensive retinopathy of both eyes  H35.033     6. Combined forms of age-related cataract of both eyes  H25.813     7. Glaucoma suspect of both eyes  H40.003      1,2. Exudative age related macular degeneration, right eye  - FA (03.10.20) shows +CNV w/ staining/leakage  - repeat FA 09.13.21 shows interval increase in perifoveal leakage OD  - s/p IVA OD #1 (03.10.20), #2 (04.13.20), #3 (05.11.20), #4 (06.23.20), #5 (8.18.20), #6 (10.27.20), #7 (01.26.21), #8 (04.26.21), #9 (08.02.21), #10 (09.13.21), #11 (10.11.21), #12 (11.9.21), #13 (12.14.21), #14 (1.24.22), #15 (3.15.22), #16 (05.24.22)  - pt was  doing very well with q3 mo maintenance injections, but then developed interval increases in central SRF over several visits -- IVA resistance vs interval too long  - OCT today shows **interval re-development of SRF at 11 wks**  - BCVA decreased to 20/25  - recommend IVA OD #17 today, 08.09.22, with dec  interval to 10 wks  - pt wishes to be treated with IVA OD  - RBA of procedure discussed, questions answered  - informed consent obtained and signed  - see procedure note  - f/u in 10 wks -- DFE/OCT/possible injection  3. Age related macular degeneration, non-exudative, left eye  - The incidence, anatomy, and pathology of dry AMD, risk of progression, and the AREDS and AREDS 2 study including smoking risks discussed with patient.  - intermediate stage  - recommend amsler grid monitoring  4,5. Hypertensive retinopathy OU  - discussed importance of tight BP control  - monitor  6. Mixed form age related cataracts OU  - The symptoms of cataract, surgical options, and treatments and risks were discussed with patient.  - discussed diagnosis and progression  - monitor  7. Glaucoma Suspect OU  - IOP 20,21  - under the expert management of Dr. Kathlen Mody   Ophthalmic Meds Ordered this visit:  Meds ordered this encounter  Medications   Bevacizumab (AVASTIN) SOLN 1.25 mg    Return in about 10 weeks (around 08/21/2021) for exu ARMD OD, Dilated Exam, OCT, Possible Injxn.  There are no Patient Instructions on file for this visit.  This document serves as a record of services personally performed by Gardiner Sleeper, MD, PhD. It was created on their behalf by Leeann Must, [place], an ophthalmic technician. The creation of this record is the provider's dictation and/or activities during the visit.    Electronically signed by: Leeann Must, COA '@TODAY'$ @ 10:25 PM  Gardiner Sleeper, M.D., Ph.D. Diseases & Surgery of the Retina and [place] 06/12/2021   I have reviewed the above documentation for accuracy and completeness, and I agree with the above. Gardiner Sleeper, M.D., Ph.D. 06/13/21 10:25 PM  Abbreviations: M myopia (nearsighted); A astigmatism; H hyperopia (farsighted); P presbyopia; Mrx spectacle prescription;  CTL contact lenses; OD right eye; OS left  eye; OU both eyes  XT exotropia; ET esotropia; PEK punctate epithelial keratitis; PEE punctate epithelial erosions; DES dry eye syndrome; MGD meibomian gland dysfunction; ATs artificial tears; PFAT's preservative free artificial tears; [place] nuclear sclerotic cataract; PSC posterior subcapsular cataract; ERM epi-retinal membrane; PVD posterior vitreous detachment; RD retinal detachment; DM diabetes mellitus; DR diabetic retinopathy; NPDR non-proliferative diabetic retinopathy; PDR proliferative diabetic retinopathy; CSME clinically significant macular edema; DME diabetic macular edema; dbh dot blot hemorrhages; CWS cotton wool spot; POAG primary open angle glaucoma; C/D cup-to-disc ratio; HVF humphrey visual field; GVF goldmann visual field; OCT optical coherence tomography; IOP intraocular pressure; BRVO Branch retinal vein occlusion; CRVO central retinal vein occlusion; CRAO central retinal artery occlusion; BRAO branch retinal artery occlusion; RT retinal tear; SB scleral buckle; PPV pars plana vitrectomy; VH Vitreous hemorrhage; PRP panretinal laser photocoagulation; IVK intravitreal kenalog; VMT vitreomacular traction; MH Macular hole;  NVD neovascularization of the disc; NVE neovascularization elsewhere; AREDS age related eye disease study; ARMD age related macular degeneration; POAG primary open angle glaucoma; EBMD epithelial/anterior basement membrane dystrophy; ACIOL anterior chamber intraocular lens; IOL intraocular lens; PCIOL posterior chamber intraocular lens; Phaco/IOL phacoemulsification with intraocular lens placement; PRK photorefractive keratectomy; LASIK laser assisted in situ keratomileusis; HTN hypertension;  DM diabetes mellitus; COPD chronic obstructive pulmonary disease

## 2021-06-12 ENCOUNTER — Other Ambulatory Visit: Payer: Self-pay

## 2021-06-12 ENCOUNTER — Ambulatory Visit (INDEPENDENT_AMBULATORY_CARE_PROVIDER_SITE_OTHER): Payer: Medicare Other | Admitting: Ophthalmology

## 2021-06-12 DIAGNOSIS — H353122 Nonexudative age-related macular degeneration, left eye, intermediate dry stage: Secondary | ICD-10-CM | POA: Diagnosis not present

## 2021-06-12 DIAGNOSIS — H3581 Retinal edema: Secondary | ICD-10-CM | POA: Diagnosis not present

## 2021-06-12 DIAGNOSIS — I10 Essential (primary) hypertension: Secondary | ICD-10-CM | POA: Diagnosis not present

## 2021-06-12 DIAGNOSIS — H40003 Preglaucoma, unspecified, bilateral: Secondary | ICD-10-CM

## 2021-06-12 DIAGNOSIS — H35033 Hypertensive retinopathy, bilateral: Secondary | ICD-10-CM | POA: Diagnosis not present

## 2021-06-12 DIAGNOSIS — H353211 Exudative age-related macular degeneration, right eye, with active choroidal neovascularization: Secondary | ICD-10-CM | POA: Diagnosis not present

## 2021-06-12 DIAGNOSIS — H25813 Combined forms of age-related cataract, bilateral: Secondary | ICD-10-CM | POA: Diagnosis not present

## 2021-06-13 ENCOUNTER — Encounter (INDEPENDENT_AMBULATORY_CARE_PROVIDER_SITE_OTHER): Payer: Self-pay | Admitting: Ophthalmology

## 2021-06-13 ENCOUNTER — Other Ambulatory Visit: Payer: Self-pay | Admitting: Family Medicine

## 2021-06-13 MED ORDER — BEVACIZUMAB CHEMO INJECTION 1.25MG/0.05ML SYRINGE FOR KALEIDOSCOPE
1.2500 mg | INTRAVITREAL | Status: AC | PRN
  Administered 2021-06-12: 1.25 mg via INTRAVITREAL

## 2021-06-26 ENCOUNTER — Other Ambulatory Visit: Payer: Self-pay | Admitting: Family Medicine

## 2021-07-07 ENCOUNTER — Other Ambulatory Visit: Payer: Self-pay | Admitting: Family Medicine

## 2021-07-20 ENCOUNTER — Other Ambulatory Visit: Payer: Self-pay | Admitting: Family Medicine

## 2021-08-03 ENCOUNTER — Other Ambulatory Visit: Payer: Self-pay | Admitting: Family Medicine

## 2021-08-17 NOTE — Progress Notes (Signed)
[facility] Note  08/21/2021   CHIEF COMPLAINT Patient presents for Retina Follow Up  HISTORY OF PRESENT ILLNESS: Faith Reeves is a 70 y.o. female who presents to the clinic today for:   HPI     Retina Follow Up   Patient presents with  Wet AMD.  In right eye.  This started years ago.  Severity is moderate.  Duration of 10 weeks.  Since onset it is stable.  I, the attending physician,  performed the HPI with the patient and updated documentation appropriately.        Comments   70 y/o female pt here for 10 wk f/u for exu ARMD OD.  No change in [place] OU.  Denies pain, FOL, floaters.  No gtts.      Last edited by Bernarda Caffey, MD on 08/22/2021  9:19 PM.     Referring physician: Hortencia Pilar, MD [place],  [place] [address]  HISTORICAL INFORMATION:   Selected notes from the MEDICAL RECORD NUMBER Referred by Dr. Quentin Ore for concern of exu ARMD OU   CURRENT MEDICATIONS: No current outpatient medications on file. (Ophthalmic Drugs)   No current facility-administered medications for this visit. (Ophthalmic Drugs)   Current Outpatient Medications (Other)  Medication Sig   albuterol (PROVENTIL HFA;VENTOLIN HFA) 108 (90 Base) MCG/ACT inhaler Inhale 2 puffs into the lungs every 4 (four) hours as needed for wheezing or shortness of breath.   amoxicillin (AMOXIL) 500 MG capsule Take 2,000 mg by mouth once. Prior to dental work (Patient not taking: No sig reported)   celecoxib (CELEBREX) 200 MG capsule TAKE 1 CAPSULE BY MOUTH EVERY DAY   gabapentin (NEURONTIN) 300 MG capsule TAKE 1 CAPSULE BY MOUTH THREE TIMES A DAY   ipratropium (ATROVENT) 0.06 % nasal spray SPRAY 2 SPRAYS INTO EACH NOSTRIL 4 TIMES A DAY   levocetirizine (XYZAL) 5 MG tablet TAKE 1 TABLET BY MOUTH EVERY DAY IN THE EVENING   mometasone (ELOCON) 0.1 % cream APPLY 1 APPLICATION TOPICALLY DAILY AS NEEDED.   oxyCODONE-acetaminophen (PERCOCET) 10-325 MG tablet  Take 1 tablet by mouth every 4 (four) hours as needed for pain (G89.4).   pantoprazole (PROTONIX) 40 MG tablet TAKE 1 TABLET BY MOUTH EVERY DAY   sertraline (ZOLOFT) 50 MG tablet Take 1.5 tablets (75 mg total) by mouth daily.   telmisartan-hydrochlorothiazide (MICARDIS HCT) 80-12.5 MG tablet TAKE 1 TABLET BY MOUTH EVERY DAY   valACYclovir (VALTREX) 1000 MG tablet Take 2 tablets (2,000 mg total) by mouth 2 (two) times daily. (Patient not taking: No sig reported)   zolpidem (AMBIEN) 10 MG tablet TAKE 1 TABLET BY MOUTH EVERY DAY AT BEDTIME AS NEEDED FOR SLEEP   Current Facility-Administered Medications (Other)  Medication Route   Bevacizumab (AVASTIN) SOLN 1.25 mg Intravitreal   REVIEW OF SYSTEMS: ROS   Positive for: Gastrointestinal, Neurological, Eyes Negative for: Constitutional, Skin, Genitourinary, Musculoskeletal, HENT, Endocrine, Cardiovascular, Respiratory, Psychiatric, Allergic/Imm, Heme/Lymph Last edited by Matthew Folks, COA on 08/21/2021  1:33 PM.    ALLERGIES Allergies  Allergen Reactions   Cymbalta [Duloxetine Hcl] Swelling    PAST MEDICAL HISTORY Past Medical History:  Diagnosis Date   Allergy    Anemia    Anxiety    Blood transfusion without reported diagnosis    Cataract    GERD (gastroesophageal reflux disease)    Hypertension    Hypertensive retinopathy    OU   Macular degeneration    Past  Surgical History:  Procedure Laterality Date   ABDOMINAL HYSTERECTOMY     ACHILLES TENDON REPAIR Bilateral    APPENDECTOMY     BACK SURGERY     BILATERAL CARPAL TUNNEL RELEASE     CHOLECYSTECTOMY     COLONOSCOPY     HERNIA REPAIR     JOINT REPLACEMENT     MELANOMA EXCISION Right 09/15/2017   Procedure: EXCISION LIPOSARCOMA RIGHT ARM;  Surgeon: Georganna Skeans, MD;  Location: [facility];  Service: General;  Laterality: Right;   SPINE SURGERY      FAMILY HISTORY Family History  Problem Relation Age of Onset   Hypertension Mother    Cancer Sister    Stroke  Maternal Grandmother     SOCIAL HISTORY Social History   Tobacco Use   Smoking status: Never   Smokeless tobacco: Never  Vaping Use   Vaping Use: Never used  Substance Use Topics   Alcohol use: Yes    Alcohol/week: 1.0 standard drink    Types: 1 Glasses of wine per week   Drug use: No       OPHTHALMIC EXAM: Base Eye Exam     Visual Acuity (Snellen - Linear)       Right Left   Dist cc 20/30 20/20   Dist ph cc NI     Correction: Glasses         Tonometry (Tonopen, 1:35 PM)       Right Left   Pressure 18 18         Pupils       Dark Light Shape React APD   Right 3 2 Round Brisk None   Left 3 2 Round Brisk None         Visual Fields (Counting fingers)       Left Right    Full Full         Extraocular Movement       Right Left    Full, Ortho Full, Ortho         Neuro/Psych     Oriented x3: Yes   Mood/Affect: Normal         Dilation     Both eyes: 1.0% Mydriacyl, 2.5% Phenylephrine @ 1:35 PM           Slit Lamp and Fundus Exam     Slit Lamp Exam       Right Left   Lids/Lashes Dermatochalasis - upper lid, mild Meibomian gland dysfunction Dermatochalasis - upper lid, mild Meibomian gland dysfunction   Conjunctiva/Sclera White and quiet White and quiet   Cornea 2-3+ diffuse Punctate epithelial erosions, decreased TBUT 2+ diffuse Punctate epithelial erosions, decrease TBUT   Anterior Chamber deep and clear deep and clear   Iris Round and dilated Round and dilated   Lens 2-3+ Nuclear sclerosis, 2-3+ Cortical cataract 2+ Nuclear sclerosis, 2+ Cortical cataract   Vitreous Vitreous syneresis, Posterior vitreous detachment, prominent vitreous condensation over macula Vitreous syneresis, Posterior vitreous detachment         Fundus Exam       Right Left   Disc Pink and Sharp, Compact Pink and Sharp, temporal PPP, Compact   C/D Ratio 0.2 0.1   Macula Blunted foveal reflex, drusen, RPE mottling, clumping and atrophy, +CNV, interval  improvement in SRF, No heme Flat, good foveal reflex, scattered Drusen, RPE mottling and clumping, focal PEDs, No heme or edema   Vessels Vascular attenuation, Tortuous Vascular attenuation, mild tortuousity   Periphery Attached, mild reticular  degeneration, no heme Attached, no heme            IMAGING AND PROCEDURES  Imaging and Procedures for @TODAY @  OCT, Retina - OU - Both Eyes       Right Eye Quality was good. Central Foveal Thickness: 274. Progression has improved. Findings include no IRF, retinal drusen , pigment epithelial detachment, outer retinal atrophy, normal foveal contour, subretinal fluid (Interval improvement in SRF).   Left Eye Quality was good. Central Foveal Thickness: 269. Progression has been stable. Findings include normal foveal contour, no SRF, no IRF, retinal drusen .   Notes *Images captured and stored on drive  Diagnosis / Impression:  OD: exu ARMD--Interval improvement in SRF OS: non-exu ARMD -- stable   Clinical management:  See below  Abbreviations: NFP - Normal foveal profile. CME - cystoid macular edema. PED - pigment epithelial detachment. IRF - intraretinal fluid. SRF - subretinal fluid. EZ - ellipsoid zone. ERM - epiretinal membrane. ORA - outer retinal atrophy. ORT - outer retinal tubulation. SRHM - subretinal hyper-reflective material      Intravitreal Injection, Pharmacologic Agent - OD - Right Eye       Time Out 08/21/2021. 2:09 PM. Confirmed correct patient, procedure, site, and patient consented.   Anesthesia Topical anesthesia was used. Anesthetic medications included Lidocaine 2%, Proparacaine 0.5%.   Procedure Preparation included 5% betadine to ocular surface, eyelid speculum. A (32g) needle was used.   Injection: 1.25 mg Bevacizumab 1.25mg /0.80ml   Route: Intravitreal, Site: Right Eye   NDC: H061816, Lot: [id number], Expiration date: 09/26/2021, Waste: 0.05 mL   Post-op Post injection exam found visual acuity of  at least counting fingers. The patient tolerated the procedure well. There were no complications. The patient received written and verbal post procedure care education. Post injection medications were not given.             ASSESSMENT/PLAN:    ICD-10-CM   1. Exudative age-related macular degeneration of right eye with active choroidal neovascularization (HCC)  H35.3211 Intravitreal Injection, Pharmacologic Agent - OD - Right Eye    Bevacizumab (AVASTIN) SOLN 1.25 mg    2. Retinal edema  H35.81 OCT, Retina - OU - Both Eyes    3. Intermediate stage nonexudative age-related macular degeneration of left eye  H35.3122     4. Essential hypertension  I10     5. Hypertensive retinopathy of both eyes  H35.033     6. Combined forms of age-related cataract of both eyes  H25.813     7. Glaucoma suspect of both eyes  H40.003     1,2. Exudative age related macular degeneration, right eye  - FA (03.10.20) shows +CNV w/ staining/leakage  - repeat FA 09.13.21 shows interval increase in perifoveal leakage OD  - s/p IVA OD #1 (03.10.20), #2 (04.13.20), #3 (05.11.20), #4 (06.23.20), #5 (8.18.20), #6 (10.27.20), #7 (01.26.21), #8 (04.26.21), #9 (08.02.21), #10 (09.13.21), #11 (10.11.21), #12 (11.9.21), #13 (12.14.21), #14 (1.24.22), #15 (3.15.22), #16 (05.24.22), #17 (8.9.22)  - pt was doing very well with q3 mo maintenance injections, but then developed interval increases in central SRF over several visits -- IVA resistance vs interval too long  - OCT today shows interval improvement in SRF at 10 wks  - BCVA stable at 20/30  - recommend IVA OD #18 today, 10.18.22, with interval at 10 wks  - pt wishes to be treated with IVA OD  - RBA of procedure discussed, questions answered  - informed consent obtained and signed  -  see procedure note  - f/u in 10 wks -- DFE/OCT/possible injection  3. Age related macular degeneration, non-exudative, left eye  - The incidence, anatomy, and pathology of dry AMD,  risk of progression, and the AREDS and AREDS 2 study including smoking risks discussed with patient.  - intermediate stage  - recommend amsler grid monitoring  4,5. Hypertensive retinopathy OU  - discussed importance of tight BP control  - monitor  6. Mixed form age related cataracts OU  - The symptoms of cataract, surgical options, and treatments and risks were discussed with patient.  - discussed diagnosis and progression  - monitor  7. Glaucoma Suspect OU  - IOP 18 OU  - under the expert management of Dr. Kathlen Mody  Ophthalmic Meds Ordered this visit:  Meds ordered this encounter  Medications   Bevacizumab (AVASTIN) SOLN 1.25 mg   Return in about 10 weeks (around 10/30/2021) for f/u exu ARMD OD, DFE, OCT.  There are no Patient Instructions on file for this visit.  This document serves as a record of services personally performed by Gardiner Sleeper, MD, PhD. It was created on their behalf by Estill Bakes, COT an ophthalmic technician. The creation of this record is the provider's dictation and/or activities during the visit.    Electronically signed by: Estill Bakes, COT 10.14.22 @ 10:37 PM   This document serves as a record of services personally performed by Gardiner Sleeper, MD, PhD. It was created on their behalf by San Jetty. Owens Shark, OA an ophthalmic technician. The creation of this record is the provider's dictation and/or activities during the visit.    Electronically signed by: San Jetty. Owens Shark, [place] 10.18.2022 10:37 PM  Gardiner Sleeper, M.D., Ph.D. Diseases & Surgery of the Retina and Vitreous [facility]  I have reviewed the above documentation for accuracy and completeness, and I agree with the above. Gardiner Sleeper, M.D., Ph.D. 08/22/21 10:39 PM  Abbreviations: M myopia (nearsighted); A astigmatism; H hyperopia (farsighted); P presbyopia; Mrx spectacle prescription;  CTL contact lenses; OD right eye; OS left eye; OU both eyes  XT exotropia; ET  esotropia; PEK punctate epithelial keratitis; PEE punctate epithelial erosions; DES dry eye syndrome; MGD meibomian gland dysfunction; ATs artificial tears; PFAT's preservative free artificial tears; [place] nuclear sclerotic cataract; PSC posterior subcapsular cataract; ERM epi-retinal membrane; PVD posterior vitreous detachment; RD retinal detachment; DM diabetes mellitus; DR diabetic retinopathy; NPDR non-proliferative diabetic retinopathy; PDR proliferative diabetic retinopathy; CSME clinically significant macular edema; DME diabetic macular edema; dbh dot blot hemorrhages; CWS cotton wool spot; POAG primary open angle glaucoma; C/D cup-to-disc ratio; HVF humphrey visual field; GVF goldmann visual field; OCT optical coherence tomography; IOP intraocular pressure; BRVO Branch retinal vein occlusion; CRVO central retinal vein occlusion; CRAO central retinal artery occlusion; BRAO branch retinal artery occlusion; RT retinal tear; SB scleral buckle; PPV pars plana vitrectomy; VH Vitreous hemorrhage; PRP panretinal laser photocoagulation; IVK intravitreal kenalog; VMT vitreomacular traction; MH Macular hole;  NVD neovascularization of the disc; NVE neovascularization elsewhere; AREDS age related eye disease study; ARMD age related macular degeneration; POAG primary open angle glaucoma; EBMD epithelial/anterior basement membrane dystrophy; ACIOL anterior chamber intraocular lens; IOL intraocular lens; PCIOL posterior chamber intraocular lens; Phaco/IOL phacoemulsification with intraocular lens placement; [place] photorefractive keratectomy; LASIK laser assisted in situ keratomileusis; HTN hypertension; DM diabetes mellitus; COPD chronic obstructive pulmonary disease

## 2021-08-21 ENCOUNTER — Other Ambulatory Visit: Payer: Self-pay

## 2021-08-21 ENCOUNTER — Ambulatory Visit (INDEPENDENT_AMBULATORY_CARE_PROVIDER_SITE_OTHER): Payer: Medicare Other | Admitting: Ophthalmology

## 2021-08-21 ENCOUNTER — Encounter (INDEPENDENT_AMBULATORY_CARE_PROVIDER_SITE_OTHER): Payer: Self-pay | Admitting: Ophthalmology

## 2021-08-21 DIAGNOSIS — H25813 Combined forms of age-related cataract, bilateral: Secondary | ICD-10-CM | POA: Diagnosis not present

## 2021-08-21 DIAGNOSIS — H40003 Preglaucoma, unspecified, bilateral: Secondary | ICD-10-CM

## 2021-08-21 DIAGNOSIS — H353122 Nonexudative age-related macular degeneration, left eye, intermediate dry stage: Secondary | ICD-10-CM

## 2021-08-21 DIAGNOSIS — H353211 Exudative age-related macular degeneration, right eye, with active choroidal neovascularization: Secondary | ICD-10-CM

## 2021-08-21 DIAGNOSIS — H3581 Retinal edema: Secondary | ICD-10-CM

## 2021-08-21 DIAGNOSIS — H35033 Hypertensive retinopathy, bilateral: Secondary | ICD-10-CM | POA: Diagnosis not present

## 2021-08-21 DIAGNOSIS — I10 Essential (primary) hypertension: Secondary | ICD-10-CM | POA: Diagnosis not present

## 2021-08-22 ENCOUNTER — Encounter (INDEPENDENT_AMBULATORY_CARE_PROVIDER_SITE_OTHER): Payer: Self-pay | Admitting: Ophthalmology

## 2021-08-22 MED ORDER — BEVACIZUMAB CHEMO INJECTION 1.25MG/0.05ML SYRINGE FOR KALEIDOSCOPE
1.2500 mg | INTRAVITREAL | Status: AC | PRN
  Administered 2021-08-21: 1.25 mg via INTRAVITREAL

## 2021-09-04 ENCOUNTER — Other Ambulatory Visit: Payer: Self-pay | Admitting: Family Medicine

## 2021-09-04 MED ORDER — OXYCODONE-ACETAMINOPHEN 10-325 MG PO TABS: 1.0000 | ORAL_TABLET | ORAL | 0 refills | Status: DC | PRN

## 2021-09-04 NOTE — Addendum Note (Signed)
Addended by: Pricilla Handler R on: 09/04/2021 11:26 AM   Modules accepted: Orders

## 2021-09-04 NOTE — Telephone Encounter (Signed)
Pt's husband called to ask for refill of oxycodone.   LOV 03/12/21 Last fill 06/07/21 #90, no refills

## 2021-09-13 ENCOUNTER — Other Ambulatory Visit: Payer: Self-pay | Admitting: Family Medicine

## 2021-09-13 NOTE — Telephone Encounter (Signed)
Ok to refill??  Last office visit/ refill 03/12/2021, #3 refills.

## 2021-09-18 DIAGNOSIS — H2513 Age-related nuclear cataract, bilateral: Secondary | ICD-10-CM | POA: Diagnosis not present

## 2021-09-18 DIAGNOSIS — H25013 Cortical age-related cataract, bilateral: Secondary | ICD-10-CM | POA: Diagnosis not present

## 2021-09-18 DIAGNOSIS — H40033 Anatomical narrow angle, bilateral: Secondary | ICD-10-CM | POA: Diagnosis not present

## 2021-09-18 DIAGNOSIS — H40013 Open angle with borderline findings, low risk, bilateral: Secondary | ICD-10-CM | POA: Diagnosis not present

## 2021-10-08 ENCOUNTER — Other Ambulatory Visit: Payer: Self-pay | Admitting: Family Medicine

## 2021-11-05 NOTE — Progress Notes (Signed)
[facility] Note  11/06/2021   CHIEF COMPLAINT Patient presents for Retina Follow Up  HISTORY OF PRESENT ILLNESS: Faith Reeves is a 71 y.o. female who presents to the clinic today for:   HPI     Retina Follow Up   Patient presents with  Wet AMD.  Severity is moderate.  Duration of 10 weeks.  Since onset it is stable.  I, the attending physician,  performed the HPI with the patient and updated documentation appropriately.        Comments   10 week retina follow up for wet armd. Patient states no vision changes noticed.       Last edited by Bernarda Caffey, MD on 11/06/2021  4:58 PM.     Referring physician: Susy Frizzle, MD [id number] [facility] [address] [place],  [place] [address]  HISTORICAL INFORMATION:   Selected notes from the MEDICAL RECORD NUMBER Referred by Dr. Quentin Ore for concern of exu ARMD OU   CURRENT MEDICATIONS: No current outpatient medications on file. (Ophthalmic Drugs)   No current facility-administered medications for this visit. (Ophthalmic Drugs)   Current Outpatient Medications (Other)  Medication Sig   albuterol (PROVENTIL HFA;VENTOLIN HFA) 108 (90 Base) MCG/ACT inhaler Inhale 2 puffs into the lungs every 4 (four) hours as needed for wheezing or shortness of breath.   celecoxib (CELEBREX) 200 MG capsule TAKE 1 CAPSULE BY MOUTH EVERY DAY   gabapentin (NEURONTIN) 300 MG capsule TAKE 1 CAPSULE BY MOUTH THREE TIMES A DAY   ipratropium (ATROVENT) 0.06 % nasal spray SPRAY 2 SPRAYS INTO EACH NOSTRIL 4 TIMES A DAY   levocetirizine (XYZAL) 5 MG tablet TAKE 1 TABLET BY MOUTH EVERY DAY IN THE EVENING   mometasone (ELOCON) 0.1 % cream APPLY 1 APPLICATION TOPICALLY DAILY AS NEEDED.   oxyCODONE-acetaminophen (PERCOCET) 10-325 MG tablet Take 1 tablet by mouth every 4 (four) hours as needed for pain (G89.4).   pantoprazole (PROTONIX) 40 MG tablet TAKE 1 TABLET BY MOUTH EVERY DAY   sertraline (ZOLOFT) 50 MG tablet Take 1.5 tablets (75 mg  total) by mouth daily.   telmisartan-hydrochlorothiazide (MICARDIS HCT) 80-12.5 MG tablet TAKE 1 TABLET BY MOUTH EVERY DAY   zolpidem (AMBIEN) 10 MG tablet TAKE 1 TABLET BY MOUTH AT BEDTIME AS NEEDED FOR SLEEP   amoxicillin (AMOXIL) 500 MG capsule Take 2,000 mg by mouth once. Prior to dental work (Patient not taking: Reported on 04/27/2021)   valACYclovir (VALTREX) 1000 MG tablet Take 2 tablets (2,000 mg total) by mouth 2 (two) times daily. (Patient not taking: Reported on 11/06/2021)   Current Facility-Administered Medications (Other)  Medication Route   Bevacizumab (AVASTIN) SOLN 1.25 mg Intravitreal   REVIEW OF SYSTEMS: ROS   Positive for: Gastrointestinal, Neurological, Eyes Negative for: Constitutional, Skin, Genitourinary, Musculoskeletal, HENT, Endocrine, Cardiovascular, Respiratory, Psychiatric, Allergic/Imm, Heme/Lymph Last edited by Elmore Guise, COT on 11/06/2021  2:11 PM.     ALLERGIES Allergies  Allergen Reactions   Cymbalta [Duloxetine Hcl] Swelling   PAST MEDICAL HISTORY Past Medical History:  Diagnosis Date   Allergy    Anemia    Anxiety    Blood transfusion without reported diagnosis    Cataract    GERD (gastroesophageal reflux disease)    Hypertension    Hypertensive retinopathy    OU   Macular degeneration    Past Surgical History:  Procedure Laterality Date   ABDOMINAL HYSTERECTOMY     ACHILLES TENDON REPAIR Bilateral    APPENDECTOMY  BACK SURGERY     BILATERAL CARPAL TUNNEL RELEASE     CHOLECYSTECTOMY     COLONOSCOPY     HERNIA REPAIR     JOINT REPLACEMENT     MELANOMA EXCISION Right 09/15/2017   Procedure: EXCISION LIPOSARCOMA RIGHT ARM;  Surgeon: Georganna Skeans, MD;  Location: [facility];  Service: General;  Laterality: Right;   SPINE SURGERY     FAMILY HISTORY Family History  Problem Relation Age of Onset   Hypertension Mother    Cancer Sister    Stroke Maternal Grandmother    SOCIAL HISTORY Social History   Tobacco Use   Smoking  status: Never   Smokeless tobacco: Never  Vaping Use   Vaping Use: Never used  Substance Use Topics   Alcohol use: Yes    Alcohol/week: 1.0 standard drink    Types: 1 Glasses of wine per week   Drug use: No       OPHTHALMIC EXAM: Base Eye Exam     Visual Acuity (Snellen - Linear)       Right Left   Dist cc 20/30-2 20/20   Dist ph cc 20/25-2     Correction: Glasses         Tonometry (Tonopen, 2:14 PM)       Right Left   Pressure 16 15         Pupils       Dark Light Shape React APD   Right 3 2 Round Minimal None   Left 3 2 Round Minimal None         Visual Fields (Counting fingers)       Left Right    Full Full         Extraocular Movement       Right Left    Full, Ortho Full, Ortho         Neuro/Psych     Oriented x3: Yes   Mood/Affect: Normal         Dilation     Both eyes: 1.0% Mydriacyl, 2.5% Phenylephrine @ 2:14 PM           Slit Lamp and Fundus Exam     Slit Lamp Exam       Right Left   Lids/Lashes Dermatochalasis - upper lid, mild Meibomian gland dysfunction Dermatochalasis - upper lid, mild Meibomian gland dysfunction   Conjunctiva/Sclera White and quiet White and quiet   Cornea 2-3+ diffuse Punctate epithelial erosions, decreased TBUT 2+ diffuse Punctate epithelial erosions, decrease TBUT   Anterior Chamber deep and clear deep and clear   Iris Round and dilated Round and dilated   Lens 2-3+ Nuclear sclerosis, 2-3+ Cortical cataract 2+ Nuclear sclerosis, 2+ Cortical cataract   Anterior Vitreous Vitreous syneresis, Posterior vitreous detachment, prominent vitreous condensation over macula Vitreous syneresis, Posterior vitreous detachment         Fundus Exam       Right Left   Disc Pink and Sharp, Compact Pink and Sharp, temporal PPP, Compact   C/D Ratio 0.2 0.1   Macula Blunted foveal reflex, drusen, RPE mottling, clumping and atrophy, +CNV, interval improvement in SRF, No heme Flat, good foveal reflex, scattered  Drusen, RPE mottling and clumping, focal PEDs, No heme or edema   Vessels Vascular attenuation, Tortuous Vascular attenuation, mild tortuousity   Periphery Attached, mild reticular degeneration, no heme Attached, no heme           Refraction     Wearing Rx  Sphere Cylinder Axis Add   Right +2.00 +0.50 008 +2.50   Left +2.50 +1.00 154 +2.50    Type: PAL            IMAGING AND PROCEDURES  Imaging and Procedures for @TODAY @  OCT, Retina - OU - Both Eyes       Right Eye Quality was good. Central Foveal Thickness: 239. Progression has improved. Findings include no IRF, retinal drusen , pigment epithelial detachment, outer retinal atrophy, normal foveal contour, no SRF (Interval improvement in SRF).   Left Eye Quality was good. Central Foveal Thickness: 268. Progression has been stable. Findings include normal foveal contour, no SRF, no IRF, retinal drusen .   Notes *Images captured and stored on drive  Diagnosis / Impression:  OD: exu ARMD--Interval improvement in SRF OS: non-exu ARMD -- stable   Clinical management:  See below  Abbreviations: NFP - Normal foveal profile. CME - cystoid macular edema. PED - pigment epithelial detachment. IRF - intraretinal fluid. SRF - subretinal fluid. EZ - ellipsoid zone. ERM - epiretinal membrane. ORA - outer retinal atrophy. ORT - outer retinal tubulation. SRHM - subretinal hyper-reflective material      Intravitreal Injection, Pharmacologic Agent - OD - Right Eye       Time Out 11/06/2021. 3:17 PM. Confirmed correct patient, procedure, site, and patient consented.   Anesthesia Topical anesthesia was used. Anesthetic medications included Lidocaine 2%, Proparacaine 0.5%.   Procedure Preparation included 5% betadine to ocular surface, eyelid speculum. A supplied (32g) needle was used.   Injection: 1.25 mg Bevacizumab 1.25mg /0.32ml   Route: Intravitreal, Site: Right Eye   NDC: H061816, Lot: [id number] , Expiration  date: 12/11/2021, Waste: 0 mL   Post-op Post injection exam found visual acuity of at least counting fingers. The patient tolerated the procedure well. There were no complications. The patient received written and verbal post procedure care education. Post injection medications were not given.            ASSESSMENT/PLAN:    ICD-10-CM   1. Exudative age-related macular degeneration of right eye with active choroidal neovascularization (HCC)  H35.3211 OCT, Retina - OU - Both Eyes    Intravitreal Injection, Pharmacologic Agent - OD - Right Eye    Bevacizumab (AVASTIN) SOLN 1.25 mg    2. Intermediate stage nonexudative age-related macular degeneration of left eye  H35.3122     3. Essential hypertension  I10     4. Hypertensive retinopathy of both eyes  H35.033     5. Combined forms of age-related cataract of both eyes  H25.813     6. Glaucoma suspect of both eyes  H40.003      1. Exudative age related macular degeneration, right eye  - pt is delayed to follow up from 10 weeks to 11 weeks due to illness  - FA (03.10.20) shows +CNV w/ staining/leakage  - repeat FA 09.13.21 shows interval increase in perifoveal leakage OD  - s/p IVA OD #1 (03.10.20), #2 (04.13.20), #3 (05.11.20), #4 (06.23.20), #5 (8.18.20), #6 (10.27.20), #7 (01.26.21), #8 (04.26.21), #9 (08.02.21), #10 (09.13.21), #11 (10.11.21), #12 (11.9.21), #13 (12.14.21), #14 (1.24.22), #15 (3.15.22), #16 (05.24.22), #17 (8.9.22), #18 (10.18.22)  - pt was doing very well with q3 mo maintenance injections, but then developed interval increases in central SRF over several visits -- IVA resistance vs interval too long  - OCT today shows interval improvement in SRF (resolved) at 11 wks  - BCVA stable at 20/30  - recommend IVA OD #19 today,  01.03.23, with extension to 12 wks  - pt wishes to be treated with IVA OD  - RBA of procedure discussed, questions answered  - informed consent obtained and signed  - see procedure note  - f/u in  12 wks -- DFE/OCT/possible injection  2. Age related macular degeneration, non-exudative, left eye  - The incidence, anatomy, and pathology of dry AMD, risk of progression, and the AREDS and AREDS 2 study including smoking risks discussed with patient.  - intermediate stage  - recommend amsler grid monitoring  3,4. Hypertensive retinopathy OU  - discussed importance of tight BP control  - monitor  5. Mixed form age related cataracts OU  - The symptoms of cataract, surgical options, and treatments and risks were discussed with patient.  - discussed diagnosis and progression  - monitor  6. Glaucoma Suspect OU  - IOP 16,15  - under the expert management of Dr. Kathlen Mody  Ophthalmic Meds Ordered this visit:  Meds ordered this encounter  Medications   Bevacizumab (AVASTIN) SOLN 1.25 mg   Return in about 12 weeks (around 01/29/2022) for f/u exu ARMD OD, DFE, OCT.  There are no Patient Instructions on file for this visit.  This document serves as a record of services personally performed by Gardiner Sleeper, MD, PhD. It was created on their behalf by Estill Bakes, COT an ophthalmic technician. The creation of this record is the provider's dictation and/or activities during the visit.    Electronically signed by: Estill Bakes, COT 1.2.23 @ 5:01 PM   This document serves as a record of services personally performed by Gardiner Sleeper, MD, PhD. It was created on their behalf by San Jetty. Owens Shark, OA an ophthalmic technician. The creation of this record is the provider's dictation and/or activities during the visit.    Electronically signed by: San Jetty. Owens Shark, [place] 01.03.2022 5:01 PM  Gardiner Sleeper, M.D., Ph.D. Diseases & Surgery of the Retina and Vitreous [facility]  I have reviewed the above documentation for accuracy and completeness, and I agree with the above. Gardiner Sleeper, M.D., Ph.D. 11/06/21 5:01 PM   Abbreviations: M myopia (nearsighted); A  astigmatism; H hyperopia (farsighted); P presbyopia; Mrx spectacle prescription;  CTL contact lenses; OD right eye; OS left eye; OU both eyes  XT exotropia; ET esotropia; PEK punctate epithelial keratitis; PEE punctate epithelial erosions; DES dry eye syndrome; MGD meibomian gland dysfunction; ATs artificial tears; PFAT's preservative free artificial tears; Ida nuclear sclerotic cataract; PSC posterior subcapsular cataract; ERM epi-retinal membrane; PVD posterior vitreous detachment; RD retinal detachment; DM diabetes mellitus; DR diabetic retinopathy; NPDR non-proliferative diabetic retinopathy; PDR proliferative diabetic retinopathy; CSME clinically significant macular edema; DME diabetic macular edema; dbh dot blot hemorrhages; CWS cotton wool spot; POAG primary open angle glaucoma; C/D cup-to-disc ratio; HVF humphrey visual field; GVF goldmann visual field; OCT optical coherence tomography; IOP intraocular pressure; BRVO Branch retinal vein occlusion; CRVO central retinal vein occlusion; CRAO central retinal artery occlusion; BRAO branch retinal artery occlusion; RT retinal tear; SB scleral buckle; PPV pars plana vitrectomy; VH Vitreous hemorrhage; PRP panretinal laser photocoagulation; IVK intravitreal kenalog; VMT vitreomacular traction; MH Macular hole;  NVD neovascularization of the disc; NVE neovascularization elsewhere; AREDS age related eye disease study; ARMD age related macular degeneration; POAG primary open angle glaucoma; EBMD epithelial/anterior basement membrane dystrophy; ACIOL anterior chamber intraocular lens; IOL intraocular lens; PCIOL posterior chamber intraocular lens; Phaco/IOL phacoemulsification with intraocular lens placement; PRK photorefractive keratectomy; LASIK laser assisted in situ  keratomileusis; HTN hypertension; DM diabetes mellitus; COPD chronic obstructive pulmonary disease

## 2021-11-06 ENCOUNTER — Other Ambulatory Visit: Payer: Self-pay

## 2021-11-06 ENCOUNTER — Ambulatory Visit (INDEPENDENT_AMBULATORY_CARE_PROVIDER_SITE_OTHER): Payer: Medicare Other | Admitting: Ophthalmology

## 2021-11-06 ENCOUNTER — Encounter (INDEPENDENT_AMBULATORY_CARE_PROVIDER_SITE_OTHER): Payer: Self-pay | Admitting: Ophthalmology

## 2021-11-06 DIAGNOSIS — H25813 Combined forms of age-related cataract, bilateral: Secondary | ICD-10-CM | POA: Diagnosis not present

## 2021-11-06 DIAGNOSIS — H353122 Nonexudative age-related macular degeneration, left eye, intermediate dry stage: Secondary | ICD-10-CM | POA: Diagnosis not present

## 2021-11-06 DIAGNOSIS — I10 Essential (primary) hypertension: Secondary | ICD-10-CM

## 2021-11-06 DIAGNOSIS — H353211 Exudative age-related macular degeneration, right eye, with active choroidal neovascularization: Secondary | ICD-10-CM | POA: Diagnosis not present

## 2021-11-06 DIAGNOSIS — H40003 Preglaucoma, unspecified, bilateral: Secondary | ICD-10-CM | POA: Diagnosis not present

## 2021-11-06 DIAGNOSIS — H35033 Hypertensive retinopathy, bilateral: Secondary | ICD-10-CM | POA: Diagnosis not present

## 2021-11-06 MED ORDER — BEVACIZUMAB CHEMO INJECTION 1.25MG/0.05ML SYRINGE FOR KALEIDOSCOPE
1.2500 mg | INTRAVITREAL | Status: AC | PRN
  Administered 2021-11-06: 1.25 mg via INTRAVITREAL

## 2021-11-28 ENCOUNTER — Other Ambulatory Visit: Payer: Self-pay

## 2021-11-28 NOTE — Telephone Encounter (Signed)
LOV 03/12/21 Last refill 09/04/21, #90, 0 refills  Please review, thanks!

## 2021-11-29 MED ORDER — OXYCODONE-ACETAMINOPHEN 10-325 MG PO TABS: 1.0000 | ORAL_TABLET | ORAL | 0 refills | Status: DC | PRN

## 2021-12-06 ENCOUNTER — Other Ambulatory Visit: Payer: Self-pay | Admitting: Family Medicine

## 2022-01-07 ENCOUNTER — Other Ambulatory Visit: Payer: Self-pay | Admitting: Family Medicine

## 2022-01-10 NOTE — Telephone Encounter (Signed)
LOV 03/12/21 ?Last refill 09/14/21, #30, 3 refills ? ?Please review, thanks! ?

## 2022-01-24 NOTE — Progress Notes (Signed)
?[facility] Note ? ?01/30/2022 ? ? CHIEF COMPLAINT ?Patient presents for Retina Follow Up ? ?HISTORY OF PRESENT ILLNESS: ?Faith Reeves is a 71 y.o. female who presents to the clinic today for:  ? ?HPI   ? ? Retina Follow Up   ?Patient presents with  Wet AMD.  In right eye.  Severity is moderate.  Duration of 12 weeks.  Since onset it is stable.  I, the attending physician,  performed the HPI with the patient and updated documentation appropriately. ? ?  ?  ? ? Comments   ?Pt here for 12 wk ret f/u exu ARMD OD. Pt states VA is the same, no change.  ? ?  ?  ?Last edited by Bernarda Caffey, MD on 02/01/2022  4:06 PM.  ?  ? ? ?Referring physician: ?Susy Frizzle, MD ?[address] [place],  [place] [address] ? ?HISTORICAL INFORMATION:  ? ?Selected notes from the [facility] ?Referred by Dr. Quentin Ore for concern of exu ARMD OU  ? ?CURRENT MEDICATIONS: ?No current outpatient medications on file. (Ophthalmic Drugs)  ? ?No current facility-administered medications for this visit. (Ophthalmic Drugs)  ? ?Current Outpatient Medications (Other)  ?Medication Sig  ? albuterol (PROVENTIL HFA;VENTOLIN HFA) 108 (90 Base) MCG/ACT inhaler Inhale 2 puffs into the lungs every 4 (four) hours as needed for wheezing or shortness of breath.  ? celecoxib (CELEBREX) 200 MG capsule TAKE 1 CAPSULE BY MOUTH EVERY DAY  ? gabapentin (NEURONTIN) 300 MG capsule TAKE 1 CAPSULE BY MOUTH THREE TIMES A DAY  ? ipratropium (ATROVENT) 0.06 % nasal spray SPRAY 2 SPRAYS INTO EACH NOSTRIL 4 TIMES A DAY  ? levocetirizine (XYZAL) 5 MG tablet TAKE 1 TABLET BY MOUTH EVERY DAY IN THE EVENING  ? mometasone (ELOCON) 0.1 % cream APPLY 1 APPLICATION TOPICALLY DAILY AS NEEDED.  ? oxyCODONE-acetaminophen (PERCOCET) 10-325 MG tablet Take 1 tablet by mouth every 4 (four) hours as needed for pain (G89.4).  ? pantoprazole (PROTONIX) 40 MG tablet TAKE 1 TABLET BY MOUTH EVERY DAY  ? sertraline (ZOLOFT) 50 MG tablet Take 1.5  tablets (75 mg total) by mouth daily.  ? telmisartan-hydrochlorothiazide (MICARDIS HCT) 80-12.5 MG tablet TAKE 1 TABLET BY MOUTH EVERY DAY  ? zolpidem (AMBIEN) 10 MG tablet TAKE 1 TABLET BY MOUTH EVERY DAY AT BEDTIME AS NEEDED FOR SLEEP  ? amoxicillin (AMOXIL) 500 MG capsule Take 2,000 mg by mouth once. Prior to dental work (Patient not taking: Reported on 04/27/2021)  ? valACYclovir (VALTREX) 1000 MG tablet Take 2 tablets (2,000 mg total) by mouth 2 (two) times daily. (Patient not taking: Reported on 11/06/2021)  ? ?Current Facility-Administered Medications (Other)  ?Medication Route  ? Bevacizumab (AVASTIN) SOLN 1.25 mg Intravitreal  ? ?REVIEW OF SYSTEMS: ?ROS   ?Positive for: Gastrointestinal, Neurological, Eyes ?Negative for: Constitutional, Skin, Genitourinary, Musculoskeletal, HENT, Endocrine, Cardiovascular, Respiratory, Psychiatric, Allergic/Imm, Heme/Lymph ?Last edited by Kingsley Spittle, COT on 01/30/2022  1:52 PM.  ?  ? ?ALLERGIES ?Allergies  ?Allergen Reactions  ? Cymbalta [Duloxetine Hcl] Swelling  ? ?PAST MEDICAL HISTORY ?Past Medical History:  ?Diagnosis Date  ? Allergy   ? Anemia   ? Anxiety   ? Blood transfusion without reported diagnosis   ? Cataract   ? GERD (gastroesophageal reflux disease)   ? Hypertension   ? Hypertensive retinopathy   ? OU  ? Macular degeneration   ? ?Past Surgical History:  ?Procedure Laterality Date  ? ABDOMINAL HYSTERECTOMY    ?  ACHILLES TENDON REPAIR Bilateral   ? APPENDECTOMY    ? BACK SURGERY    ? BILATERAL CARPAL TUNNEL RELEASE    ? CHOLECYSTECTOMY    ? COLONOSCOPY    ? HERNIA REPAIR    ? JOINT REPLACEMENT    ? MELANOMA EXCISION Right 09/15/2017  ? Procedure: EXCISION LIPOSARCOMA RIGHT ARM;  Surgeon: Georganna Skeans, MD;  Location: [facility];  Service: General;  Laterality: Right;  ? SPINE SURGERY    ? ?FAMILY HISTORY ?Family History  ?Problem Relation Age of Onset  ? Hypertension Mother   ? Cancer Sister   ? Stroke Maternal Grandmother   ? ?SOCIAL HISTORY ?Social History   ? ?Tobacco Use  ? Smoking status: Never  ? Smokeless tobacco: Never  ?Vaping Use  ? Vaping Use: Never used  ?Substance Use Topics  ? Alcohol use: Yes  ?  Alcohol/week: 1.0 standard drink  ?  Types: 1 Glasses of wine per week  ? Drug use: No  ?  ? ?  ?OPHTHALMIC EXAM: ?Base Eye Exam   ? ? Visual Acuity (Snellen - Linear)   ? ?   Right Left  ? Dist cc 20/30 -2 20/25 +2  ? Dist ph cc NI   ? ? Correction: Glasses  ? ?  ?  ? ? Tonometry (Tonopen, 1:58 PM)   ? ?   Right Left  ? Pressure 17 17  ? ?  ?  ? ? Pupils   ? ?   Dark Light Shape React APD  ? Right 3 2 Round Minimal None  ? Left 3 2 Round Minimal None  ? ?  ?  ? ? Visual Fields (Counting fingers)   ? ?   Left Right  ?  Full Full  ? ?  ?  ? ? Extraocular Movement   ? ?   Right Left  ?  Full, Ortho Full, Ortho  ? ?  ?  ? ? Neuro/Psych   ? ? Oriented x3: Yes  ? Mood/Affect: Normal  ? ?  ?  ? ? Dilation   ? ? Both eyes: 1.0% Mydriacyl, 2.5% Phenylephrine @ 1:58 PM  ? ?  ?  ? ?  ? ?Slit Lamp and Fundus Exam   ? ? Slit Lamp Exam   ? ?   Right Left  ? Lids/Lashes Dermatochalasis - upper lid, mild Meibomian gland dysfunction Dermatochalasis - upper lid, mild Meibomian gland dysfunction  ? Conjunctiva/Sclera White and quiet White and quiet  ? Cornea 2-3+ diffuse Punctate epithelial erosions, decreased TBUT 2+ diffuse Punctate epithelial erosions, decrease TBUT  ? Anterior Chamber deep and clear deep and clear  ? Iris Round and dilated Round and dilated  ? Lens 2-3+ Nuclear sclerosis, 2-3+ Cortical cataract 2+ Nuclear sclerosis, 2+ Cortical cataract  ? Anterior Vitreous Vitreous syneresis, Posterior vitreous detachment, prominent vitreous condensation over macula Vitreous syneresis, Posterior vitreous detachment  ? ?  ?  ? ? Fundus Exam   ? ?   Right Left  ? Disc Pink and Sharp, Compact Pink and Sharp, temporal PPP, Compact  ? C/D Ratio 0.2 0.1  ? Macula Blunted foveal reflex, drusen, RPE mottling, clumping and atrophy, +CNV, interval re-development of  SRF, No heme Flat,  good foveal reflex, scattered Drusen, RPE mottling and clumping, focal PEDs, No heme or edema  ? Vessels Vascular attenuation, Tortuous Vascular attenuation, mild tortuousity  ? Periphery Attached, mild reticular degeneration, no heme Attached, no heme  ? ?  ?  ? ?  ? ?  Refraction   ? ? Wearing Rx   ? ?   Sphere Cylinder Axis Add  ? Right +2.00 +0.50 008 +2.50  ? Left +2.50 +1.00 154 +2.50  ? ? Type: PAL  ? ?  ?  ? ?  ? ? ?IMAGING AND PROCEDURES  ?Imaging and Procedures for _0 @ ? ?OCT, Retina - OU - Both Eyes   ? ?   ?Right Eye ?Quality was good. Central Foveal Thickness: 342. Progression has worsened. Findings include no IRF, retinal drusen , pigment epithelial detachment, outer retinal atrophy, normal foveal contour, no SRF (Interval re-development of central SRF overlying PEDS).  ? ?Left Eye ?Quality was good. Central Foveal Thickness: 270. Progression has been stable. Findings include normal foveal contour, no SRF, no IRF, retinal drusen .  ? ?Notes ?*Images captured and stored on drive ? ?Diagnosis / Impression:  ?OD: exu ARMD -- Interval re-development of central SRF overlying PEDS ?OS: non-exu ARMD -- stable ? ? ?Clinical management:  ?See below ? ?Abbreviations: NFP - Normal foveal profile. CME - cystoid macular edema. PED - pigment epithelial detachment. IRF - intraretinal fluid. SRF - subretinal fluid. EZ - ellipsoid zone. ERM - epiretinal membrane. ORA - outer retinal atrophy. ORT - outer retinal tubulation. SRHM - subretinal hyper-reflective material ? ? ?  ? ?Intravitreal Injection, Pharmacologic Agent - OD - Right Eye   ? ?   ?Time Out ?01/30/2022. 2:27 PM. Confirmed correct patient, procedure, site, and patient consented.  ? ?Anesthesia ?Topical anesthesia was used. Anesthetic medications included Lidocaine 2%, Proparacaine 0.5%.  ? ?Procedure ?Preparation included 5% betadine to ocular surface, eyelid speculum. A supplied needle was used.  ? ?Injection: ?1.25 mg Bevacizumab 1.37m/0.05ml ?  Route:  Intravitreal, Site: Right Eye ?  NDC: 536144-315-40 Lot: [id number] , Expiration date: 03/13/2022  ? ?Post-op ?Post injection exam found visual acuity of at least counting fingers. The patient tolerated the p

## 2022-01-30 ENCOUNTER — Encounter (INDEPENDENT_AMBULATORY_CARE_PROVIDER_SITE_OTHER): Payer: Self-pay | Admitting: Ophthalmology

## 2022-01-30 ENCOUNTER — Ambulatory Visit (INDEPENDENT_AMBULATORY_CARE_PROVIDER_SITE_OTHER): Payer: Medicare Other | Admitting: Ophthalmology

## 2022-01-30 DIAGNOSIS — H25813 Combined forms of age-related cataract, bilateral: Secondary | ICD-10-CM | POA: Diagnosis not present

## 2022-01-30 DIAGNOSIS — H35033 Hypertensive retinopathy, bilateral: Secondary | ICD-10-CM

## 2022-01-30 DIAGNOSIS — H353122 Nonexudative age-related macular degeneration, left eye, intermediate dry stage: Secondary | ICD-10-CM | POA: Diagnosis not present

## 2022-01-30 DIAGNOSIS — H353211 Exudative age-related macular degeneration, right eye, with active choroidal neovascularization: Secondary | ICD-10-CM

## 2022-01-30 DIAGNOSIS — H40003 Preglaucoma, unspecified, bilateral: Secondary | ICD-10-CM

## 2022-01-30 DIAGNOSIS — I10 Essential (primary) hypertension: Secondary | ICD-10-CM

## 2022-02-01 ENCOUNTER — Encounter (INDEPENDENT_AMBULATORY_CARE_PROVIDER_SITE_OTHER): Payer: Self-pay | Admitting: Ophthalmology

## 2022-02-01 MED ORDER — BEVACIZUMAB CHEMO INJECTION 1.25MG/0.05ML SYRINGE FOR KALEIDOSCOPE
1.2500 mg | INTRAVITREAL | Status: AC | PRN
  Administered 2022-02-01: 1.25 mg via INTRAVITREAL

## 2022-02-14 ENCOUNTER — Other Ambulatory Visit: Payer: Self-pay | Admitting: Family Medicine

## 2022-02-14 MED ORDER — OXYCODONE-ACETAMINOPHEN 10-325 MG PO TABS: 1.0000 | ORAL_TABLET | ORAL | 0 refills | Status: DC | PRN

## 2022-02-14 NOTE — Telephone Encounter (Signed)
Received voicemail message from patient's spouse to request refill of ? ?oxyCODONE-acetaminophen (PERCOCET) 10-325 MG tablet [[id number]]  ? ?Pharmacy confirmed as [facility] on [address]. ? ?Please advise at [phone]. ?

## 2022-02-14 NOTE — Telephone Encounter (Signed)
LOV 03/12/21 ?Last refill 11/29/21, #90, 0 refills ? ?Please review, thanks! ? ? ?

## 2022-02-19 ENCOUNTER — Encounter: Payer: Self-pay | Admitting: Family Medicine

## 2022-02-19 ENCOUNTER — Ambulatory Visit (INDEPENDENT_AMBULATORY_CARE_PROVIDER_SITE_OTHER): Payer: Medicare Other | Admitting: Family Medicine

## 2022-02-19 VITALS — BP 122/78 | HR 88 | Temp 97.0°F | Resp 18 | Ht 59.0 in | Wt 232.0 lb

## 2022-02-19 DIAGNOSIS — Z1231 Encounter for screening mammogram for malignant neoplasm of breast: Secondary | ICD-10-CM

## 2022-02-19 DIAGNOSIS — Z1211 Encounter for screening for malignant neoplasm of colon: Secondary | ICD-10-CM

## 2022-02-19 DIAGNOSIS — M25511 Pain in right shoulder: Secondary | ICD-10-CM | POA: Diagnosis not present

## 2022-02-19 DIAGNOSIS — I10 Essential (primary) hypertension: Secondary | ICD-10-CM | POA: Diagnosis not present

## 2022-02-19 DIAGNOSIS — N1831 Chronic kidney disease, stage 3a: Secondary | ICD-10-CM | POA: Diagnosis not present

## 2022-02-19 NOTE — Progress Notes (Signed)
? ?Subjective:  ? ? Patient ID: Faith Reeves, female    DOB: 02/03/1951, 71 y.o.   MRN: [id number] ? ? ?Patient presents today with a 1 month history of right-sided neck pain.  The pain begins on the right side of her neck and radiates into her posterior shoulder and down to around her lower tricep area on her right arm.  She reports some burning stinging pain in the right arm however she also reports a dull constant ache in the right shoulder.  Today on exam, she has a negative Spurling's maneuver.  However she has significant pain with empty can testing as well as Hawkins maneuver.  She is unable to lift her arm greater than 90 degrees without significant pain.  She has crepitus with range of motion.  All of this reproduces the pain she is experiencing.  Therefore while her history suggest cervical radiculopathy, her exam supports rotator cuff pathology or shoulder joint issue.  We discussed these 2 possibilities.  The patient states that the physical exam findings more accurately reproduce the pain and therefore I suspect that this is more likely subacromial bursitis or rotator cuff tendinitis.  However the patient may also have some underlying cervical radiculopathy as well.  She is overdue for mammogram.  She is also overdue for colon cancer screening as well as fasting lab work.  Her blood pressure today is well controlled at 122/78. ?Past Medical History:  ?Diagnosis Date  ? Allergy   ? Anemia   ? Anxiety   ? Blood transfusion without reported diagnosis   ? Cataract   ? GERD (gastroesophageal reflux disease)   ? Hypertension   ? Hypertensive retinopathy   ? OU  ? Macular degeneration   ? ?Past Surgical History:  ?Procedure Laterality Date  ? ABDOMINAL HYSTERECTOMY    ? ACHILLES TENDON REPAIR Bilateral   ? APPENDECTOMY    ? BACK SURGERY    ? BILATERAL CARPAL TUNNEL RELEASE    ? CHOLECYSTECTOMY    ? COLONOSCOPY    ? HERNIA REPAIR    ? JOINT REPLACEMENT    ? MELANOMA EXCISION Right 09/15/2017  ? Procedure:  EXCISION LIPOSARCOMA RIGHT ARM;  Surgeon: Georganna Skeans, MD;  Location: [facility];  Service: General;  Laterality: Right;  ? SPINE SURGERY    ? ?Current Outpatient Medications on File Prior to Visit  ?Medication Sig Dispense Refill  ? albuterol (PROVENTIL HFA;VENTOLIN HFA) 108 (90 Base) MCG/ACT inhaler Inhale 2 puffs into the lungs every 4 (four) hours as needed for wheezing or shortness of breath. 1 Inhaler 0  ? celecoxib (CELEBREX) 200 MG capsule TAKE 1 CAPSULE BY MOUTH EVERY DAY 90 capsule 3  ? gabapentin (NEURONTIN) 300 MG capsule TAKE 1 CAPSULE BY MOUTH THREE TIMES A DAY 90 capsule 1  ? ipratropium (ATROVENT) 0.06 % nasal spray SPRAY 2 SPRAYS INTO EACH NOSTRIL 4 TIMES A DAY 45 mL 1  ? levocetirizine (XYZAL) 5 MG tablet TAKE 1 TABLET BY MOUTH EVERY DAY IN THE EVENING 30 tablet 5  ? mometasone (ELOCON) 0.1 % cream APPLY 1 APPLICATION TOPICALLY DAILY AS NEEDED. 45 g 3  ? oxyCODONE-acetaminophen (PERCOCET) 10-325 MG tablet Take 1 tablet by mouth every 4 (four) hours as needed for pain (G89.4). 90 tablet 0  ? pantoprazole (PROTONIX) 40 MG tablet TAKE 1 TABLET BY MOUTH EVERY DAY 90 tablet 3  ? sertraline (ZOLOFT) 50 MG tablet Take 1.5 tablets (75 mg total) by mouth daily. 135 tablet 3  ? telmisartan-hydrochlorothiazide (MICARDIS HCT)  80-12.5 MG tablet TAKE 1 TABLET BY MOUTH EVERY DAY 90 tablet 3  ? valACYclovir (VALTREX) 1000 MG tablet Take 2 tablets (2,000 mg total) by mouth 2 (two) times daily. (Patient not taking: Reported on 11/06/2021) 4 tablet 2  ? zolpidem (AMBIEN) 10 MG tablet TAKE 1 TABLET BY MOUTH EVERY DAY AT BEDTIME AS NEEDED FOR SLEEP 30 tablet 3  ? ?Current Facility-Administered Medications on File Prior to Visit  ?Medication Dose Route Frequency Provider Last Rate Last Admin  ? Bevacizumab (AVASTIN) SOLN 1.25 mg  1.25 mg Intravitreal  Bernarda Caffey, MD   1.25 mg at 01/12/19 1629  ? ?Allergies  ?Allergen Reactions  ? Cymbalta [Duloxetine Hcl] Swelling  ? ?Social History  ? ?Socioeconomic History  ? Marital  status: Married  ?  Spouse name: Not on file  ? Number of children: Not on file  ? Years of education: Not on file  ? Highest education level: Not on file  ?Occupational History  ? Not on file  ?Tobacco Use  ? Smoking status: Never  ? Smokeless tobacco: Never  ?Vaping Use  ? Vaping Use: Never used  ?Substance and Sexual Activity  ? Alcohol use: Yes  ?  Alcohol/week: 1.0 standard drink  ?  Types: 1 Glasses of wine per week  ? Drug use: No  ? Sexual activity: Never  ?Other Topics Concern  ? Not on file  ?Social History Narrative  ? Not on file  ? ?Social Determinants of Health  ? ?Financial Resource Strain: Low Risk   ? Difficulty of Paying Living Expenses: Not hard at all  ?Food Insecurity: No Food Insecurity  ? Worried About Charity fundraiser in the Last Year: Never true  ? Ran Out of Food in the Last Year: Never true  ?Transportation Needs: No Transportation Needs  ? Lack of Transportation (Medical): No  ? Lack of Transportation (Non-Medical): No  ?Physical Activity: Inactive  ? Days of Exercise per Week: 0 days  ? Minutes of Exercise per Session: 0 min  ?Stress: No Stress Concern Present  ? Feeling of Stress : Not at all  ?Social Connections: Moderately Isolated  ? Frequency of Communication with Friends and Family: More than three times a week  ? Frequency of Social Gatherings with Friends and Family: Once a week  ? Attends Religious Services: Never  ? Active Member of Clubs or Organizations: No  ? Attends Archivist Meetings: Never  ? Marital Status: Married  ?Intimate Partner Violence: Not At Risk  ? Fear of Current or Ex-Partner: No  ? Emotionally Abused: No  ? Physically Abused: No  ? Sexually Abused: No  ? ? ? ?Review of Systems  ?All other systems reviewed and are negative. ? ?   ?Objective:  ? Physical Exam ?Constitutional:   ?   Appearance: She is obese.  ?Cardiovascular:  ?   Rate and Rhythm: Normal rate and regular rhythm.  ?   Heart sounds: Normal heart sounds.  ?Pulmonary:  ?   Effort:  Pulmonary effort is normal.  ?   Breath sounds: Normal breath sounds.  ?Musculoskeletal:  ?   Right shoulder: Tenderness and crepitus present. Decreased range of motion. Decreased strength.  ?   Thoracic back: Scoliosis present.  ?   Lumbar back: Scoliosis present.  ?Neurological:  ?   Mental Status: She is alert.  ? ? ? ? ? ?   ?Assessment & Plan:  ?Benign essential HTN - Plan: CBC with Differential/Platelet, Lipid panel,  COMPLETE METABOLIC PANEL WITH GFR ? ?Stage 3a chronic kidney disease ([place]) - Plan: CBC with Differential/Platelet, Lipid panel, COMPLETE METABOLIC PANEL WITH GFR ? ?Colon cancer screening - Plan: Cologuard ? ?Encounter for screening mammogram for malignant neoplasm of breast - Plan: MM Digital Screening ? ?Acute pain of right shoulder ?Discussed the situation with the patient and her husband.  Her story really suggest cervical radiculopathy.  However her physical exam suggest shoulder pathology as the cause most likely rotator cuff tendinitis.  Therefore we decided to perform a cortisone injection in the right subacromial space.  Using sterile technique, I injected the right shoulder with 40 mg of methylprednisolone, 2 cc of lidocaine, 2 cc of Marcaine.  The patient tolerated the procedure well without complication.  If she sees no benefit in the pain particularly in the neck, I would recommend imaging to evaluate for cervical neuropathy.  Blood pressure today is well controlled.  I will schedule the patient for mammogram to screen for breast cancer and I will schedule the patient for Cologuard screening for colon cancer.  While she is here I will check a CBC a CMP and a lipid panel. ?

## 2022-02-20 LAB — COMPLETE METABOLIC PANEL WITH GFR
AG Ratio: 1.2 (calc) (ref 1.0–2.5)
ALT: 18 U/L (ref 6–29)
AST: 22 U/L (ref 10–35)
Albumin: 3.6 g/dL (ref 3.6–5.1)
Alkaline phosphatase (APISO): 112 U/L (ref 37–153)
BUN/Creatinine Ratio: 17 (calc) (ref 6–22)
BUN: 19 mg/dL (ref 7–25)
CO2: 29 mmol/L (ref 20–32)
Calcium: 9.2 mg/dL (ref 8.6–10.4)
Chloride: 105 mmol/L (ref 98–110)
Creat: 1.1 mg/dL — ABNORMAL HIGH (ref 0.60–1.00)
Globulin: 3.1 g/dL (calc) (ref 1.9–3.7)
Glucose, Bld: 84 mg/dL (ref 65–99)
Potassium: 4 mmol/L (ref 3.5–5.3)
Sodium: 143 mmol/L (ref 135–146)
Total Bilirubin: 0.5 mg/dL (ref 0.2–1.2)
Total Protein: 6.7 g/dL (ref 6.1–8.1)
eGFR: 53 mL/min/{1.73_m2} — ABNORMAL LOW (ref >=60)

## 2022-02-20 LAB — CBC WITH DIFFERENTIAL/PLATELET
Absolute Monocytes: 331 cells/uL (ref 200–950)
Basophils Absolute: 29 cells/uL (ref 0–200)
Basophils Relative: 0.5 %
Eosinophils Absolute: 251 cells/uL (ref 15–500)
Eosinophils Relative: 4.4 %
HCT: 43.8 % (ref 35.0–45.0)
Hemoglobin: 14.4 g/dL (ref 11.7–15.5)
Lymphs Abs: 1300 cells/uL (ref 850–3900)
MCH: 29.6 pg (ref 27.0–33.0)
MCHC: 32.9 g/dL (ref 32.0–36.0)
MCV: 90.1 fL (ref 80.0–100.0)
MPV: 11 fL (ref 7.5–12.5)
Monocytes Relative: 5.8 %
Neutro Abs: 3791 cells/uL (ref 1500–7800)
Neutrophils Relative %: 66.5 %
Platelets: 215 10*3/uL (ref 140–400)
RBC: 4.86 10*6/uL (ref 3.80–5.10)
RDW: 13.1 % (ref 11.0–15.0)
Total Lymphocyte: 22.8 %
WBC: 5.7 10*3/uL (ref 3.8–10.8)

## 2022-02-20 LAB — LIPID PANEL
Cholesterol: 139 mg/dL (ref <200)
HDL: 52 mg/dL (ref >=50)
LDL Cholesterol (Calc): 69 mg/dL (calc)
Non-HDL Cholesterol (Calc): 87 mg/dL (calc) (ref <130)
Total CHOL/HDL Ratio: 2.7 (calc) (ref <5.0)
Triglycerides: 95 mg/dL (ref <150)

## 2022-02-26 DIAGNOSIS — Z1211 Encounter for screening for malignant neoplasm of colon: Secondary | ICD-10-CM | POA: Diagnosis not present

## 2022-02-27 ENCOUNTER — Ambulatory Visit
Admission: RE | Admit: 2022-02-27 | Discharge: 2022-02-27 | Disposition: A | Discharge status: Home / Self Care | Payer: Medicare Other | Source: Ambulatory Visit | Attending: Family Medicine | Admitting: Family Medicine

## 2022-02-27 DIAGNOSIS — Z1231 Encounter for screening mammogram for malignant neoplasm of breast: Secondary | ICD-10-CM | POA: Diagnosis not present

## 2022-03-06 ENCOUNTER — Ambulatory Visit: Payer: Self-pay

## 2022-03-06 ENCOUNTER — Ambulatory Visit
Admission: RE | Admit: 2022-03-06 | Discharge: 2022-03-06 | Disposition: A | Discharge status: Home / Self Care | Payer: Medicare Other | Source: Ambulatory Visit | Attending: Physician Assistant | Admitting: Physician Assistant

## 2022-03-06 VITALS — BP 120/80 | HR 72 | Temp 97.8°F | Resp 18

## 2022-03-06 DIAGNOSIS — M25511 Pain in right shoulder: Secondary | ICD-10-CM

## 2022-03-06 DIAGNOSIS — M7501 Adhesive capsulitis of right shoulder: Secondary | ICD-10-CM

## 2022-03-06 NOTE — ED Triage Notes (Signed)
Pt present right shoulder pain, symptoms started two week ago. Pt is unable to raise arm, pt recently received Cortizone shot in her shoulder pcp. ?

## 2022-03-06 NOTE — Discharge Instructions (Addendum)
Take the Celebrex '400mg'$  daily for the next 5 days. ?Alternating Heat and Ice to area - 3-4 times daily. ?Orthopedics contacted for evaluation. ?

## 2022-03-06 NOTE — Telephone Encounter (Signed)
?  Chief Complaint: arm pain ?Symptoms: severe R arm pain, 15/10 ?Frequency: 2 weeks but gotten worse ?Pertinent Negatives: NA ?Disposition: '[]'$ ED /'[x]'$ Urgent Care (no appt availability in office) / '[]'$ Appointment(In office/virtual)/ '[]'$  [facility]/ '[]'$ Home Care/ '[]'$ Refused Recommended Disposition /'[]'$  Mobile Bus/ '[]'$  Follow-up with PCP ?Additional Notes: pt called PCP but unable to be seen today in office so FC transferred call to this triager. I advised pt she could go to UC or ED. Pt preferred UC since ED has so long of wait time. I attempted to schedule for [facility] but unable to schedule with provider so scheduled at [facility] at 1500 today.  ? ?Reason for Disposition ? [1] SEVERE pain AND [2] not improved 2 hours after pain medicine ? ?Answer Assessment - Initial Assessment Questions ?1. ONSET: "When did the pain start?" ?    2 week  ?2. LOCATION: "Where is the pain located?" ?    R arm  ?3. PAIN: "How bad is the pain?" (Scale 1-10; or mild, moderate, severe) ?  - MILD (1-3): doesn't interfere with normal activities ?  - MODERATE (4-7): interferes with normal activities (e.g., work or school) or awakens from sleep ?  - SEVERE (8-10): excruciating pain, unable to do any normal activities, unable to hold a cup of water ?    15/10 ?6. OTHER SYMPTOMS: "Do you have any other symptoms?" (e.g., neck pain, swelling, rash, fever, numbness, weakness) ?    Unable to lift arm ? ?Protocols used: Arm Pain-A-AH ? ?

## 2022-03-06 NOTE — ED Provider Notes (Signed)
?[place] ? ? ? ?CSN: [id number] ?Arrival date & time: 03/06/22  1458 ? ? ?  ? ?History   ?Chief Complaint ?Chief Complaint  ?Patient presents with  ? Shoulder Pain  ? ? ?HPI ?Faith Reeves is a 71 y.o. female.  ? ?71 year old female presents with right shoulder pain patient relates that she has been having progressive right shoulder for the past 4 weeks and indicates that this is 10 on a scale of 1-10. Patient has been taking her regular medication which has been walking and Celebrex intermittently but without relief.  Patient indicates she did not have any trauma to the area. Patient relates she is doing any heavy lifting patient or repetitive motion.  Patient indicates she does have some generalized right shoulder where she will have pain in the deltoid area with pain radiating intermittently down into the arm with some associated weakness.  Patient relates over the past 4 weeks range of motion for the right shoulder is limited and she relates she cannot lift her right arm above 70 degrees abduction. Patient indicates she does not have any fever or chills.  Patient relates she neck pain or shortness of breath. ? ?Patient relates she was evaluated 2 weeks ago by her primary care physician.  Patient indicates during that office visit she received a steroid injection in the right posterior shoulder. Patient indicates that the injection has not helped. ? ? ? ? ? ? ?Arm Injury ? ?Past Medical History:  ?Diagnosis Date  ? Allergy   ? Anemia   ? Anxiety   ? Blood transfusion without reported diagnosis   ? Cataract   ? GERD (gastroesophageal reflux disease)   ? Hypertension   ? Hypertensive retinopathy   ? OU  ? Macular degeneration   ? ? ?Patient Active Problem List  ? Diagnosis Date Noted  ? Macular degeneration of right eye 12/18/2016  ? Allergy   ? Anxiety   ? Blood transfusion without reported diagnosis   ? Cataract   ? Hypertension   ? ? ?Past Surgical History:  ?Procedure Laterality Date  ? ABDOMINAL  HYSTERECTOMY    ? ACHILLES TENDON REPAIR Bilateral   ? APPENDECTOMY    ? BACK SURGERY    ? BILATERAL CARPAL TUNNEL RELEASE    ? CHOLECYSTECTOMY    ? COLONOSCOPY    ? HERNIA REPAIR    ? JOINT REPLACEMENT    ? MELANOMA EXCISION Right 09/15/2017  ? Procedure: EXCISION LIPOSARCOMA RIGHT ARM;  Surgeon: Georganna Skeans, MD;  Location: [place];  Service: General;  Laterality: Right;  ? SPINE SURGERY    ? ? ?OB History   ?No obstetric history on file. ?  ? ? ? ?Home Medications   ? ?Prior to Admission medications   ?Medication Sig Start Date End Date Taking? Authorizing Provider  ?albuterol (PROVENTIL HFA;VENTOLIN HFA) 108 (90 Base) MCG/ACT inhaler Inhale 2 puffs into the lungs every 4 (four) hours as needed for wheezing or shortness of breath. 04/15/16   Janne Napoleon, NP  ?celecoxib (CELEBREX) 200 MG capsule TAKE 1 CAPSULE BY MOUTH EVERY DAY 10/21/19   Susy Frizzle, MD  ?gabapentin (NEURONTIN) 300 MG capsule TAKE 1 CAPSULE BY MOUTH THREE TIMES A DAY 12/06/21   Susy Frizzle, MD  ?ipratropium (ATROVENT) 0.06 % nasal spray SPRAY 2 SPRAYS INTO EACH NOSTRIL 4 TIMES A DAY 07/20/21   Susy Frizzle, MD  ?levocetirizine (XYZAL) 5 MG tablet TAKE 1 TABLET BY MOUTH EVERY DAY IN THE  EVENING 09/04/21   Susy Frizzle, MD  ?mometasone (ELOCON) 0.1 % cream APPLY 1 APPLICATION TOPICALLY DAILY AS NEEDED. 09/21/20   Susy Frizzle, MD  ?oxyCODONE-acetaminophen (PERCOCET) 10-325 MG tablet Take 1 tablet by mouth every 4 (four) hours as needed for pain (G89.4). 02/14/22   Susy Frizzle, MD  ?pantoprazole (PROTONIX) 40 MG tablet TAKE 1 TABLET BY MOUTH EVERY DAY 06/14/21   Susy Frizzle, MD  ?sertraline (ZOLOFT) 50 MG tablet Take 1.5 tablets (75 mg total) by mouth daily. 04/27/21   Susy Frizzle, MD  ?telmisartan-hydrochlorothiazide (MICARDIS HCT) 80-12.5 MG tablet TAKE 1 TABLET BY MOUTH EVERY DAY 06/14/21   Susy Frizzle, MD  ?valACYclovir (VALTREX) 1000 MG tablet Take 2 tablets (2,000 mg total) by mouth 2 (two) times  daily. 09/08/18   Susy Frizzle, MD  ?zolpidem (AMBIEN) 10 MG tablet TAKE 1 TABLET BY MOUTH EVERY DAY AT BEDTIME AS NEEDED FOR SLEEP 01/10/22   Susy Frizzle, MD  ? ? ?Family History ?Family History  ?Problem Relation Age of Onset  ? Hypertension Mother   ? Cancer Sister   ? Stroke Maternal Grandmother   ? ? ?Social History ?Social History  ? ?Tobacco Use  ? Smoking status: Never  ? Smokeless tobacco: Never  ?Vaping Use  ? Vaping Use: Never used  ?Substance Use Topics  ? Alcohol use: Yes  ?  Alcohol/week: 1.0 standard drink  ?  Types: 1 Glasses of wine per week  ? Drug use: No  ? ? ? ?Allergies   ?Cymbalta [duloxetine hcl] ? ? ?Review of Systems ?Review of Systems  ?Musculoskeletal:  Positive for joint swelling.  ? ? ?Physical Exam ?Triage Vital Signs ?ED Triage Vitals  ?Enc Vitals Group  ?   BP   ?   Pulse   ?   Resp   ?   Temp   ?   Temp src   ?   SpO2   ?   Weight   ?   Height   ?   Head Circumference   ?   Peak Flow   ?   Pain Score   ?   Pain Loc   ?   Pain Edu?   ?   Excl. in [place]?   ? ?No data found. ? ?Updated Vital Signs ?BP 120/80 (BP Location: Left Arm)   Pulse 72   Temp 97.8 ?F (36.6 ?C) (Oral)   Resp 18   SpO2 94%  ? ?Visual Acuity ?Right Eye Distance:   ?Left Eye Distance:   ?Bilateral Distance:   ? ?Right Eye Near:   ?Left Eye Near:    ?Bilateral Near:    ? ?Physical Exam ?Constitutional:   ?   Appearance: Normal appearance.  ?Cardiovascular:  ?   Rate and Rhythm: Normal rate and regular rhythm.  ?   Comments: Heart: Regular rate and rhythm without murmurs, heaves, thrills. ? ? ?Pulmonary:  ?   Effort: Pulmonary effort is normal.  ?   Breath sounds: Normal breath sounds and air entry.  ?   Comments: Lungs: Normal breath sounds, no Rales, rhonchi, or wheezing bilaterally. ?Musculoskeletal:  ?   Right shoulder: Tenderness and bony tenderness present. Decreased range of motion. Decreased strength.  ?     Arms: ? ?   Comments: Right shoulder: Tenderness palpated.  At shoulder joint and deltoid  area.  Range of motion is limited to 60 degrees lateral abduction and forward abduction.  Crepitus noted.  Strength is 3/5 as opposed to 5/5 on the left.  ?Neurological:  ?   Mental Status: She is alert.  ? ? ? ?UC Treatments / Results  ?Labs ?(all labs ordered are listed, but only abnormal results are displayed) ?Labs Reviewed - No data to display ? ?EKG ? ? ?Radiology ?No results found. ? ?Procedures ?Procedures (including critical care time) ? ?Medications Ordered in UC ?Medications - No data to display ? ?Initial Impression / Assessment and Plan / UC Course  ?I have reviewed the triage vital signs and the nursing notes. ? ?Pertinent labs & imaging results that were available during my care of the patient were reviewed by me and considered in my medical decision making (see chart for details). ? ?  Plan: ?Take the Celebrex '400mg'$  daily for the next 5 days. ?Alternating Heat and Ice to area - 3-4 times daily. ?Patient given referral to Orthopedics for evaluation. ?Patient advised to follow-up with PCP or return to UC as needed. ? ? ?Final Clinical Impressions(s) / UC Diagnoses  ? ?Final diagnoses:  ?Acute pain of right shoulder  ?Adhesive capsulitis of right shoulder  ? ? ? ?Discharge Instructions   ? ?  ?Take the Celebrex '400mg'$  daily for the next 5 days. ?Alternating Heat and Ice to area - 3-4 times daily. ?Orthopedics contacted for evaluation. ? ? ? ? ?ED Prescriptions   ?None ?  ? ?PDMP not reviewed this encounter. ?  ?Nyoka Lint, PA-C ?03/06/22 1550 ? ?

## 2022-03-08 LAB — COLOGUARD: COLOGUARD: NEGATIVE

## 2022-03-13 ENCOUNTER — Ambulatory Visit (INDEPENDENT_AMBULATORY_CARE_PROVIDER_SITE_OTHER): Payer: Medicare Other | Admitting: Orthopaedic Surgery

## 2022-03-13 ENCOUNTER — Encounter: Payer: Self-pay | Admitting: Orthopaedic Surgery

## 2022-03-13 ENCOUNTER — Ambulatory Visit (INDEPENDENT_AMBULATORY_CARE_PROVIDER_SITE_OTHER): Payer: Medicare Other

## 2022-03-13 DIAGNOSIS — G8929 Other chronic pain: Secondary | ICD-10-CM | POA: Diagnosis not present

## 2022-03-13 DIAGNOSIS — M542 Cervicalgia: Secondary | ICD-10-CM | POA: Diagnosis not present

## 2022-03-13 DIAGNOSIS — M25511 Pain in right shoulder: Secondary | ICD-10-CM

## 2022-03-13 MED ORDER — METHOCARBAMOL 500 MG PO TABS: 500.0000 mg | ORAL_TABLET | Freq: Two times a day (BID) | ORAL | 2 refills | Status: DC | PRN

## 2022-03-13 MED ORDER — PREDNISONE 10 MG (21) PO TBPK: ORAL_TABLET | ORAL | 0 refills | Status: DC

## 2022-03-13 NOTE — Progress Notes (Signed)
? ?Office Visit Note ?  ?Patient: Faith Reeves           ?Date of Birth: Apr 09, 1951           ?MRN: [id number] ?Visit Date: 03/13/2022 ?             ?Requested by: Nyoka Lint, PA-C ?[address] ?[address] ?[place],  [place] [address] ?PCP: Susy Frizzle, MD ? ? ?Assessment & Plan: ?Visit Diagnoses:  ?1. Chronic right shoulder pain   ?2. Neck pain   ? ? ?Plan: Impression is chronic neck and right shoulder pain.  I believe the patient is actually having symptoms not only from her neck but from her shoulder.  She has arthritis to both areas.  In regards to the neck, would like to start her on a steroid taper, muscle relaxer and physical therapy.  In regards to the right shoulder, made referral to Dr. Ernestina Patches for glenohumeral cortisone injection.  She is agreeable to these plans.  Follow-up with [place] as needed. ? ?Follow-Up Instructions: Return if symptoms worsen or fail to improve.  ? ?Orders:  ?Orders Placed This Encounter  ?Procedures  ? XR Shoulder Right  ? XR Cervical Spine 2 or 3 views  ? Ambulatory referral to Physical Therapy  ? ?Meds ordered this encounter  ?Medications  ? predniSONE (STERAPRED UNI-PAK 21 TAB) 10 MG (21) TBPK tablet  ?  Sig: Take as directed  ?  Dispense:  21 tablet  ?  Refill:  0  ? methocarbamol (ROBAXIN) 500 MG tablet  ?  Sig: Take 1 tablet (500 mg total) by mouth 2 (two) times daily as needed for muscle spasms.  ?  Dispense:  20 tablet  ?  Refill:  2  ? ? ? ? Procedures: ?No procedures performed ? ? ?Clinical Data: ?No additional findings. ? ? ?Subjective: ?Chief Complaint  ?Patient presents with  ? Right Shoulder - Pain  ? Neck - Pain  ? ? ?HPI patient is a pleasant 71 year old female who comes in today with neck and right shoulder pain for the past month which is worsened over the past 2 weeks.  She denies any injury or change in activity.  The pain is primarily to the right side of the neck radiates to the parascapular region and down the arm.  Symptoms appear to be worse with rotation  as well as flexion extension of the neck and range of motion of the shoulder.  She has been taking Celebrex without significant relief.  She does note occasional numbness and tingling to her fingers.  She was seen by her primary care recently where it sounds like she had a trigger point injection which is somewhat helped. ? ?Review of Systems as detailed in HPI.  All others reviewed and are negative. ? ? ?Objective: ?Vital Signs: There were no vitals taken for this visit. ? ?Physical Exam well-developed well-nourished female no acute distress.  Alert and oriented x3. ? ?Ortho Exam cervical spine exam shows no spinous tenderness.  Moderate tenderness along the parascapular region.  Moderate tenderness to the [facility] joint.  She has increased pain with cervical spine flexion, extension or rotation.  She has pain and about 50% range of motion of the shoulder with forward flexion, internal rotation and external rotation.  No focal weakness.  She is neurovascular intact distally. ? ?Specialty Comments:  ?No specialty comments available. ? ?Imaging: ?XR Cervical Spine 2 or 3 views ? ?Result Date: 03/13/2022 ?Multilevel degenerative changes ? ?XR Shoulder Right ? ?  Result Date: 03/13/2022 ?Moderate glenohumeral and AC joint degenerative changes  ? ? ?PMFS History: ?Patient Active Problem List  ? Diagnosis Date Noted  ? Macular degeneration of right eye 12/18/2016  ? Allergy   ? Anxiety   ? Blood transfusion without reported diagnosis   ? Cataract   ? Hypertension   ? ?Past Medical History:  ?Diagnosis Date  ? Allergy   ? Anemia   ? Anxiety   ? Blood transfusion without reported diagnosis   ? Cataract   ? GERD (gastroesophageal reflux disease)   ? Hypertension   ? Hypertensive retinopathy   ? OU  ? Macular degeneration   ?  ?Family History  ?Problem Relation Age of Onset  ? Hypertension Mother   ? Cancer Sister   ? Stroke Maternal Grandmother   ?  ?Past Surgical History:  ?Procedure Laterality Date  ? ABDOMINAL HYSTERECTOMY    ?  ACHILLES TENDON REPAIR Bilateral   ? APPENDECTOMY    ? BACK SURGERY    ? BILATERAL CARPAL TUNNEL RELEASE    ? CHOLECYSTECTOMY    ? COLONOSCOPY    ? HERNIA REPAIR    ? JOINT REPLACEMENT    ? MELANOMA EXCISION Right 09/15/2017  ? Procedure: EXCISION LIPOSARCOMA RIGHT ARM;  Surgeon: Georganna Skeans, MD;  Location: [place];  Service: General;  Laterality: Right;  ? SPINE SURGERY    ? ?Social History  ? ?Occupational History  ? Not on file  ?Tobacco Use  ? Smoking status: Never  ? Smokeless tobacco: Never  ?Vaping Use  ? Vaping Use: Never used  ?Substance and Sexual Activity  ? Alcohol use: Yes  ?  Alcohol/week: 1.0 standard drink  ?  Types: 1 Glasses of wine per week  ? Drug use: No  ? Sexual activity: Never  ? ? ? ? ? ? ?

## 2022-03-14 ENCOUNTER — Other Ambulatory Visit: Payer: Self-pay | Admitting: Family Medicine

## 2022-03-15 ENCOUNTER — Other Ambulatory Visit: Payer: Self-pay

## 2022-03-15 DIAGNOSIS — G8929 Other chronic pain: Secondary | ICD-10-CM

## 2022-03-15 NOTE — Telephone Encounter (Signed)
Requested Prescriptions  ?Pending Prescriptions Disp Refills  ?? gabapentin (NEURONTIN) 300 MG capsule [Pharmacy Med Name: GABAPENTIN 300 MG CAPSULE] 90 capsule 1  ?  Sig: TAKE 1 CAPSULE BY MOUTH THREE TIMES A DAY  ?  ? Neurology: Anticonvulsants - gabapentin Failed - 03/14/2022  6:29 PM  ?  ?  Failed - Cr in normal range and within 360 days  ?  Creat  ?Date Value Ref Range Status  ?02/19/2022 1.10 (H) 0.60 - 1.00 mg/dL Final  ?   ?  ?  Passed - Completed PHQ-2 or PHQ-9 in the last 360 days  ?  ?  Passed - Valid encounter within last 12 months  ?  Recent Outpatient Visits   ?      ? 3 weeks ago Benign essential HTN  ? [facility] Pickard, Cammie Mcgee, MD  ? 1 year ago Benign essential HTN  ? [facility] Pickard, Cammie Mcgee, MD  ? 1 year ago Benign essential HTN  ? [place] Pickard, Cammie Mcgee, MD  ? 2 years ago Acute idiopathic gout of right wrist  ? [facility] Pickard, Cammie Mcgee, MD  ? 2 years ago General medical exam  ? [facility] Dennard Schaumann Cammie Mcgee, MD  ?  ?  ? ?  ?  ?  ? ? ?

## 2022-03-27 DIAGNOSIS — H04123 Dry eye syndrome of bilateral lacrimal glands: Secondary | ICD-10-CM | POA: Diagnosis not present

## 2022-03-27 DIAGNOSIS — H40013 Open angle with borderline findings, low risk, bilateral: Secondary | ICD-10-CM | POA: Diagnosis not present

## 2022-03-27 DIAGNOSIS — H25013 Cortical age-related cataract, bilateral: Secondary | ICD-10-CM | POA: Diagnosis not present

## 2022-03-28 ENCOUNTER — Ambulatory Visit (INDEPENDENT_AMBULATORY_CARE_PROVIDER_SITE_OTHER): Payer: Medicare Other | Admitting: Physical Therapy

## 2022-03-28 ENCOUNTER — Encounter: Payer: Self-pay | Admitting: Physical Therapy

## 2022-03-28 DIAGNOSIS — G8929 Other chronic pain: Secondary | ICD-10-CM

## 2022-03-28 DIAGNOSIS — M6281 Muscle weakness (generalized): Secondary | ICD-10-CM

## 2022-03-28 DIAGNOSIS — M25511 Pain in right shoulder: Secondary | ICD-10-CM

## 2022-03-28 DIAGNOSIS — M542 Cervicalgia: Secondary | ICD-10-CM

## 2022-03-28 NOTE — Therapy (Signed)
OUTPATIENT PHYSICAL THERAPY CERVICAL/shoulder EVALUATION   Patient Name: Faith Reeves MRN: [id number] DOB:[birth year], 71 y.o., female Today's Date: 03/28/2022   PT End of Session - 03/28/22 1449     Visit Number 1    Number of Visits 15    Date for PT Re-Evaluation 06/20/22    Authorization Type MCR    Progress Note Due on Visit 10    PT Start Time 0998    PT Stop Time 1427    PT Time Calculation (min) 42 min    Activity Tolerance Patient tolerated treatment well    Behavior During Therapy WFL for tasks assessed/performed             Past Medical History:  Diagnosis Date   Allergy    Anemia    Anxiety    Blood transfusion without reported diagnosis    Cataract    GERD (gastroesophageal reflux disease)    Hypertension    Hypertensive retinopathy    OU   Macular degeneration    Past Surgical History:  Procedure Laterality Date   ABDOMINAL HYSTERECTOMY     ACHILLES TENDON REPAIR Bilateral    APPENDECTOMY     BACK SURGERY     BILATERAL CARPAL TUNNEL RELEASE     CHOLECYSTECTOMY     COLONOSCOPY     HERNIA REPAIR     JOINT REPLACEMENT     MELANOMA EXCISION Right 09/15/2017   Procedure: EXCISION LIPOSARCOMA RIGHT ARM;  Surgeon: Georganna Skeans, MD;  Location: [place];  Service: General;  Laterality: Right;   SPINE SURGERY     Patient Active Problem List   Diagnosis Date Noted   Macular degeneration of right eye 12/18/2016   Allergy    Anxiety    Blood transfusion without reported diagnosis    Cataract    Hypertension     PCP: Susy Frizzle, MD  REFERRING PROVIDER: Aundra Dubin, PA-C  REFERRING DIAG: [phone] (ICD-10-CM) - Chronic right shoulder pain M54.2 (ICD-10-CM) - Neck pain  THERAPY DIAG:  Cervicalgia  Chronic right shoulder pain  Muscle weakness (generalized)  Rationale for Evaluation and Treatment Rehabilitation  ONSET DATE: chronic pain since  SUBJECTIVE:                                                                                                                                                                                                          SUBJECTIVE STATEMENT: Relays chronic neck and shoulder pain with some intermittent N/T down her Right arm into her fingers. She also says she has pain in  her back due to scoliosis. She relays 3 previous back surgeries and bilat carpal tunnel surgeries but no neck surgeries  PERTINENT HISTORY:  anx, HTN, spine surgery, achillies tendon repair, left hip replacement, carpal tunnel surgery  PAIN:  Are you having pain? Yes: NPRS scale: 25/10 Pain location: Rt neck and shoulder Pain description: shooting and can have some intermittent N/T down Rt arm Aggravating factors: reaching,laying on your right, moving, vacuum, Relieving factors: rest  PRECAUTIONS: None  WEIGHT BEARING RESTRICTIONS No  FALLS:  Has patient fallen in last 6 months? No  OCCUPATION: retired  PLOF: Independent with basic ADL's  PATIENT GOALS reduce pain  OBJECTIVE:   DIAGNOSTIC FINDINGS:  XR Shoulder Right  03/13/2022 "Moderate glenohumeral and AC joint degenerative changes " neck XR 03/13/22 " Multilevel degenerative changes"   PATIENT SURVEYS:  5/25/23FOTO 45% functional   COGNITION: Overall cognitive status: Within functional limits for tasks assessed   SENSATION: WFL  POSTURE: fwd posture with scoliosis noted  PALPATION: TTP with trigger points in Rt upper trap, infraspinatus, deltoids   CERVICAL ROM:   Active ROM A/PROM (deg) eval  Flexion WNL and no pain  Extension 75% and pain on right side  Right lateral flexion WNL  Left lateral flexion 25% and pulling  Right rotation 75% and pain on Rt  Left rotation 75% and pain on Rt   (Blank rows = not tested)  UPPER EXTREMITY ROM: 03/28/22 PROM was [facility] for abduction and flexion  Active ROM Right eval Left eval  Shoulder flexion 80   Shoulder extension    Shoulder abduction 70   Shoulder adduction    Shoulder  extension    Shoulder internal rotation Unable to reach behind back   Shoulder external rotation C5 behind head    Elbow flexion    Elbow extension    Wrist flexion    Wrist extension    Wrist ulnar deviation    Wrist radial deviation    Wrist pronation    Wrist supination     (Blank rows = not tested)  UPPER EXTREMITY MMT:  MMT Right eval Left eval  Shoulder flexion 2   Shoulder extension 4   Shoulder abduction 2   Shoulder adduction    Shoulder extension    Shoulder internal rotation 4   Shoulder external rotation 4-   Middle trapezius    Lower trapezius    Elbow flexion 4+   Elbow extension 4+   Wrist flexion    Wrist extension    Wrist ulnar deviation    Wrist radial deviation    Wrist pronation    Wrist supination    Grip strength     (Blank rows = not tested)  SPECIAL TESTS:  03/28/22  Negative cervical spurlings test  + impingement tests for Rt shoulder  FUNCTIONAL TESTS:     TODAY'S TREATMENT:  MHP X 10 min to Rt shoulder HEP creation, demo, and review   PATIENT EDUCATION:  Education details: HEP, PT plan of care Person educated: Patient Education method: Explanation, Demonstration, Verbal cues, and Handouts Education comprehension: verbalized understanding and needs further education   HOME EXERCISE PROGRAM: Access Code: [email or web address] URL: [email or web address] Date: 03/28/2022 Prepared by: Elsie Ra  Exercises - Supine Shoulder Internal Rotation Stretch  - 1-2 x daily - 6 x weekly - 3 sets - 5 reps - 10 sec hold - Standing Shoulder Flexion Wall Slide  - 1-2 x daily - 6 x weekly - 1 sets - 10 reps - Standing  Shoulder Abduction Slides at Wall  - 1-2 x daily - 6 x weekly - 1 sets - 10 reps - Standing Shoulder Row with Anchored Resistance  - 1-2 x daily - 6 x weekly - 1 sets - 10 reps - Shoulder Extension with Resistance - Neutral  - 1-2 x daily - 6 x weekly - 2 sets - 10 reps - Shoulder External Rotation with Anchored  Resistance  - 1-2 x daily - 6 x weekly - 2 sets - 10 reps - Shoulder Internal Rotation with Resistance  - 1-2 x daily - 6 x weekly - 2 sets - 10 reps  ASSESSMENT:  CLINICAL IMPRESSION: Patient presents to PT with chronic neck and Right shoulder pain with OA. She has signs of Rt shoulder impingement or tendonopathy and I can not rule out RTC tear. She at least does have some RTC strength and had negative drop arm test to RTC rupture is not likely. She has history of cervical radiculopathy but this was not present on eval today.  Patient will benefit from skilled PT to address below impairments, limitations and improve overall function.  OBJECTIVE IMPAIRMENTS: decreased activity tolerance, decreased shoulder mobility, decreased ROM, decreased strength, impaired flexibility, impaired UE use, postural dysfunction, and pain.  ACTIVITY LIMITATIONS: reaching, lifting, carry,  cleaning, driving, and sleeping  PERSONAL FACTORS: anx, HTN, spine surgery, scoliosis, achillies tendon repair are also affecting patient's functional outcome.  REHAB POTENTIAL: Good  CLINICAL DECISION MAKING: moderate   EVALUATION COMPLEXITY: moderate    GOALS: Short term PT Goals Target date: 04/25/2022 Pt will be I and compliant with HEP. Baseline:  Goal status: New Pt will decrease pain by 25% overall Baseline: Goal status: New  Long term PT goals Target date: 06/20/2022 Pt will improve Rt shoulder AROM to [facility] to improve functional reaching Baseline: Goal status: New Pt will improve  Rt shoulder strength to at least 4+/5 MMT to improve functional strength Baseline: Goal status: New Pt will improve FOTO to at least 60% functional to show improved function Baseline: Goal status: New Pt will reduce pain 50% with usual ADL's and sleeping Baseline: Goal status: New  PLAN: PT FREQUENCY: 1-2 times per week   PT DURATION: 12 weeks  PLANNED INTERVENTIONS (unless contraindicated): aquatic PT, Canalith  repositioning, cryotherapy, Electrical stimulation, Iontophoresis with 4 mg/ml dexamethasome, Moist heat, traction, Ultrasound, gait training, Therapeutic exercise, balance training, neuromuscular re-education, patient/family education, prosthetic training, manual techniques, passive ROM, dry needling, taping, vasopnuematic device, vestibular, spinal manipulations, joint manipulations  PLAN FOR NEXT SESSION: review HEP, she had high irritability of symptoms so needs gentle progressions, consider DN or other modalities.    Debbe Odea, PT,DPT 03/28/2022, 4:06 PM

## 2022-03-31 ENCOUNTER — Other Ambulatory Visit: Payer: Self-pay | Admitting: Family Medicine

## 2022-03-31 DIAGNOSIS — L299 Pruritus, unspecified: Secondary | ICD-10-CM

## 2022-04-02 NOTE — Telephone Encounter (Signed)
Requested Prescriptions  Pending Prescriptions Disp Refills  . levocetirizine (XYZAL) 5 MG tablet [Pharmacy Med Name: LEVOCETIRIZINE 5 MG TABLET] 90 tablet 1    Sig: TAKE 1 TABLET BY MOUTH EVERY DAY IN THE EVENING     Ear, Nose, and Throat:  Antihistamines - levocetirizine dihydrochloride Failed - 03/31/2022  3:20 PM      Failed - Cr in normal range and within 360 days    Creat  Date Value Ref Range Status  02/19/2022 1.10 (H) 0.60 - 1.00 mg/dL Final         Passed - eGFR is 10 or above and within 360 days    GFR, Est African American  Date Value Ref Range Status  03/12/2021 55 (L) > OR = 60 mL/min/1.73m2 Final   GFR, Est Non African American  Date Value Ref Range Status  03/12/2021 47 (L) > OR = 60 mL/min/1.73m2 Final   eGFR  Date Value Ref Range Status  02/19/2022 53 (L) > OR = 60 mL/min/1.73m2 Final    Comment:    The eGFR is based on the CKD-EPI 2021 equation. To calculate  the new eGFR from a previous Creatinine or Cystatin C result, go to https://www.kidney.org/professionals/ kdoqi/gfr%[email or web address]          Passed - Valid encounter within last 12 months    Recent Outpatient Visits          1 month ago Benign essential HTN   [facility] Pickard, Warren T, MD   1 year ago Benign essential HTN   [facility] Pickard, Warren T, MD   1 year ago Benign essential HTN   [facility] Pickard, Warren T, MD   2 years ago Acute idiopathic gout of right wrist   [facility] Pickard, Warren T, MD   2 years ago General medical exam   [facility] Pickard, Warren T, MD             . pantoprazole (PROTONIX) 40 MG tablet [Pharmacy Med Name: PANTOPRAZOLE SOD DR 40 MG TAB] 90 tablet 3    Sig: TAKE 1 TABLET BY MOUTH EVERY DAY     Gastroenterology: Proton Pump Inhibitors Passed - 03/31/2022  3:20 PM      Passed - Valid encounter within last 12 months    Recent Outpatient Visits          1 month  ago Benign essential HTN   [facility] Pickard, Warren T, MD   1 year ago Benign essential HTN   [facility] Pickard, Warren T, MD   1 year ago Benign essential HTN   [facility] Pickard, Warren T, MD   2 years ago Acute idiopathic gout of right wrist   [facility] Pickard, Warren T, MD   2 years ago General medical exam   [facility] Pickard, Warren T, MD             . mometasone (ELOCON) 0.1 % cream [Pharmacy Med Name: MOMETASONE FUROATE 0.1% CREAM] 45 g 3    Sig: APPLY TO AFFECTED AREA EVERY DAY AS NEEDED     Off-Protocol Failed - 03/31/2022  3:20 PM      Failed - Medication not assigned to a protocol, review manually.      Passed - Valid encounter within last 12 months    Recent Outpatient Visits            1 month ago Benign essential HTN   [facility] Pickard, Warren T, MD   1 year ago Benign essential HTN   [facility] Pickard, Warren T, MD   1 year ago Benign essential HTN   [facility] Pickard, Warren T, MD   2 years ago Acute idiopathic gout of right wrist   [facility] Pickard, Warren T, MD   2 years ago General medical exam   [facility] Pickard, Warren T, MD             . sertraline (ZOLOFT) 50 MG tablet [Pharmacy Med Name: SERTRALINE HCL 50 MG TABLET] 135 tablet 3    Sig: TAKE 1 AND 1/2 TABLETS BY MOUTH DAILY     Psychiatry:  Antidepressants - SSRI - sertraline Passed - 03/31/2022  3:20 PM      Passed - AST in normal range and within 360 days    AST  Date Value Ref Range Status  02/19/2022 22 10 - 35 U/L Final         Passed - ALT in normal range and within 360 days    ALT  Date Value Ref Range Status  02/19/2022 18 6 - 29 U/L Final         Passed - Completed PHQ-2 or PHQ-9 in the last 360 days      Passed - Valid encounter within last 6 months    Recent Outpatient Visits           1 month ago Benign essential HTN   [facility] Pickard, Warren T, MD   1 year ago Benign essential HTN   [facility] Pickard, Warren T, MD   1 year ago Benign essential HTN   [facility] Pickard, Warren T, MD   2 years ago Acute idiopathic gout of right wrist   [facility] Pickard, Warren T, MD   2 years ago General medical exam   [facility] Pickard, Warren T, MD               

## 2022-04-08 NOTE — Progress Notes (Signed)
[facility] Note  04/10/2022   CHIEF COMPLAINT Patient presents for Retina Follow Up  HISTORY OF PRESENT ILLNESS: Faith Reeves is a 71 y.o. female who presents to the clinic today for:   HPI     Retina Follow Up   Patient presents with  Wet AMD.  In right eye.  This started 10 weeks ago.  I, the attending physician,  performed the HPI with the patient and updated documentation appropriately.        Comments   Patient here for 10 weeks retina follow up for exu ARMD OD. Patient states vision terrible. Saw Dr Herbert Deaner. Has cataract OD. Future cataract surgery in November.  OD very blurry. OS is good. OD sometimes has eye pain. Yesterday had a shot of cortisone in right shoulder.       Last edited by Bernarda Caffey, MD on 04/10/2022  4:10 PM.    Pt states vision is "horrible", she states she cannot read the TV, she saw Dr. Herbert Deaner who told her she needs cat sx, but she is going to wait until November   Referring physician: Susy Frizzle, MD [id number] [facility] [place],  [place] [address]  HISTORICAL INFORMATION:   Selected notes from the MEDICAL RECORD NUMBER Referred by Dr. Quentin Ore for concern of exu ARMD OU   CURRENT MEDICATIONS: No current outpatient medications on file. (Ophthalmic Drugs)   No current facility-administered medications for this visit. (Ophthalmic Drugs)   Current Outpatient Medications (Other)  Medication Sig   albuterol (PROVENTIL HFA;VENTOLIN HFA) 108 (90 Base) MCG/ACT inhaler Inhale 2 puffs into the lungs every 4 (four) hours as needed for wheezing or shortness of breath.   celecoxib (CELEBREX) 200 MG capsule TAKE 1 CAPSULE BY MOUTH EVERY DAY   gabapentin (NEURONTIN) 300 MG capsule TAKE 1 CAPSULE BY MOUTH THREE TIMES A DAY   ipratropium (ATROVENT) 0.06 % nasal spray SPRAY 2 SPRAYS INTO EACH NOSTRIL 4 TIMES A DAY   levocetirizine (XYZAL) 5 MG tablet TAKE 1 TABLET BY MOUTH EVERY DAY IN THE EVENING   methocarbamol (ROBAXIN)  500 MG tablet Take 1 tablet (500 mg total) by mouth 2 (two) times daily as needed for muscle spasms.   mometasone (ELOCON) 0.1 % cream APPLY TO AFFECTED AREA EVERY DAY AS NEEDED   oxyCODONE-acetaminophen (PERCOCET) 10-325 MG tablet Take 1 tablet by mouth every 4 (four) hours as needed for pain (G89.4).   pantoprazole (PROTONIX) 40 MG tablet TAKE 1 TABLET BY MOUTH EVERY DAY   predniSONE (STERAPRED UNI-PAK 21 TAB) 10 MG (21) TBPK tablet Take as directed   sertraline (ZOLOFT) 50 MG tablet TAKE 1 AND 1/2 TABLETS BY MOUTH DAILY   telmisartan-hydrochlorothiazide (MICARDIS HCT) 80-12.5 MG tablet TAKE 1 TABLET BY MOUTH EVERY DAY   valACYclovir (VALTREX) 1000 MG tablet Take 2 tablets (2,000 mg total) by mouth 2 (two) times daily.   zolpidem (AMBIEN) 10 MG tablet TAKE 1 TABLET BY MOUTH EVERY DAY AT BEDTIME AS NEEDED FOR SLEEP   No current facility-administered medications for this visit. (Other)   REVIEW OF SYSTEMS: ROS   Positive for: Gastrointestinal, Neurological, Eyes Negative for: Constitutional, Skin, Genitourinary, Musculoskeletal, HENT, Endocrine, Cardiovascular, Respiratory, Psychiatric, Allergic/Imm, Heme/Lymph Last edited by Theodore Demark, COA on 04/10/2022  1:37 PM.     ALLERGIES Allergies  Allergen Reactions   Cymbalta [Duloxetine Hcl] Swelling   PAST MEDICAL HISTORY Past Medical History:  Diagnosis Date   Allergy    Anemia  Anxiety    Blood transfusion without reported diagnosis    Cataract    GERD (gastroesophageal reflux disease)    Hypertension    Hypertensive retinopathy    OU   Macular degeneration    Past Surgical History:  Procedure Laterality Date   ABDOMINAL HYSTERECTOMY     ACHILLES TENDON REPAIR Bilateral    APPENDECTOMY     BACK SURGERY     BILATERAL CARPAL TUNNEL RELEASE     CHOLECYSTECTOMY     COLONOSCOPY     HERNIA REPAIR     JOINT REPLACEMENT     MELANOMA EXCISION Right 09/15/2017   Procedure: EXCISION LIPOSARCOMA RIGHT ARM;  Surgeon:  Georganna Skeans, MD;  Location: [facility];  Service: General;  Laterality: Right;   SPINE SURGERY     FAMILY HISTORY Family History  Problem Relation Age of Onset   Hypertension Mother    Cancer Sister    Stroke Maternal Grandmother    SOCIAL HISTORY Social History   Tobacco Use   Smoking status: Never   Smokeless tobacco: Never  Vaping Use   Vaping Use: Never used  Substance Use Topics   Alcohol use: Yes    Alcohol/week: 1.0 standard drink    Types: 1 Glasses of wine per week   Drug use: No       OPHTHALMIC EXAM: Base Eye Exam     Visual Acuity (Snellen - Linear)       Right Left   Dist cc 20/40 +1 20/20 -1   Dist ph cc 20/30 -2     Correction: Glasses         Tonometry (Tonopen, 1:33 PM)       Right Left   Pressure 14 17         Pupils       Dark Light Shape React APD   Right 3 2 Round Minimal None   Left 3 2 Round Minimal None         Visual Fields (Counting fingers)       Left Right    Full Full         Extraocular Movement       Right Left    Full, Ortho Full, Ortho         Neuro/Psych     Oriented x3: Yes   Mood/Affect: Normal         Dilation     Both eyes: 1.0% Mydriacyl, 2.5% Phenylephrine @ 1:33 PM           Slit Lamp and Fundus Exam     Slit Lamp Exam       Right Left   Lids/Lashes Dermatochalasis - upper lid, mild Meibomian gland dysfunction Dermatochalasis - upper lid, mild Meibomian gland dysfunction   Conjunctiva/Sclera White and quiet White and quiet   Cornea 2-3+ diffuse Punctate epithelial erosions, decreased TBUT 2+ diffuse Punctate epithelial erosions, decrease TBUT   Anterior Chamber deep and clear deep and clear   Iris Round and dilated Round and dilated   Lens 2-3+ Nuclear sclerosis, 2-3+ Cortical cataract 2+ Nuclear sclerosis, 2+ Cortical cataract   Anterior Vitreous Vitreous syneresis, Posterior vitreous detachment, prominent vitreous condensation over macula Vitreous syneresis, Posterior  vitreous detachment         Fundus Exam       Right Left   Disc Pink and Sharp, Compact Pink and Sharp, temporal PPP, Compact   C/D Ratio 0.2 0.1   Macula Blunted foveal reflex, drusen, RPE  mottling, clumping and atrophy, +CNV, interval increase in central SRF, No heme Flat, good foveal reflex, scattered Drusen, RPE mottling and clumping, focal PEDs, No heme or edema   Vessels attenuated, Tortuous attenuated, mild tortuosity   Periphery Attached, mild reticular degeneration, no heme Attached, no heme           Refraction     Wearing Rx       Sphere Cylinder Axis Add   Right +2.00 +0.50 008 +2.50   Left +2.50 +1.00 154 +2.50    Type: PAL            IMAGING AND PROCEDURES  Imaging and Procedures for '@TODAY'$ @  OCT, Retina - OU - Both Eyes       Right Eye Quality was good. Central Foveal Thickness: 324. Progression has worsened. Findings include no IRF, retinal drusen , pigment epithelial detachment, outer retinal atrophy, normal foveal contour, no SRF (Interval increase in central SRF overlying PEDS).   Left Eye Quality was good. Central Foveal Thickness: 278. Progression has been stable. Findings include normal foveal contour, no SRF, no IRF, retinal drusen .   Notes *Images captured and stored on drive  Diagnosis / Impression:  OD: exu ARMD -- Interval increase in central SRF overlying PEDS OS: non-exu ARMD -- stable   Clinical management:  See below  Abbreviations: NFP - Normal foveal profile. CME - cystoid macular edema. PED - pigment epithelial detachment. IRF - intraretinal fluid. SRF - subretinal fluid. EZ - ellipsoid zone. ERM - epiretinal membrane. ORA - outer retinal atrophy. ORT - outer retinal tubulation. SRHM - subretinal hyper-reflective material      Intravitreal Injection, Pharmacologic Agent - OD - Right Eye       Time Out 04/10/2022. 2:17 PM. Confirmed correct patient, procedure, site, and patient consented.   Anesthesia Topical  anesthesia was used. Anesthetic medications included Lidocaine 2%, Proparacaine 0.5%.   Procedure Preparation included 5% betadine to ocular surface, eyelid speculum. A (32g) needle was used.   Injection: 2 mg aflibercept 2 MG/0.05ML   Route: Intravitreal, Site: Right Eye   NDC: [id number], Lot: [id number], Expiration date: 02/02/2023, Waste: 0 mL   Post-op Post injection exam found visual acuity of at least counting fingers. The patient tolerated the procedure well. There were no complications. The patient received written and verbal post procedure care education. Post injection medications were not given.            ASSESSMENT/PLAN:    ICD-10-CM   1. Exudative age-related macular degeneration of right eye with active choroidal neovascularization (HCC)  H35.3211 OCT, Retina - OU - Both Eyes    Intravitreal Injection, Pharmacologic Agent - OD - Right Eye    aflibercept (EYLEA) SOLN 2 mg    2. Intermediate stage nonexudative age-related macular degeneration of left eye  H35.3122     3. Essential hypertension  I10     4. Hypertensive retinopathy of both eyes  H35.033     5. Combined forms of age-related cataract of both eyes  H25.813     6. Glaucoma suspect of both eyes  H40.003       1. Exudative age related macular degeneration, right eye  - FA (03.10.20) shows +CNV w/ staining/leakage  - repeat FA 09.13.21 shows interval increase in perifoveal leakage OD  - s/p IVA OD #1 (03.10.20), #2 (04.13.20), #3 (05.11.20), #4 (06.23.20), #5 (8.18.20), #6 (10.27.20), #7 (01.26.21), #8 (04.26.21), #9 (08.02.21), #10 (09.13.21), #11 (10.11.21), #12 (11.9.21), #13 (12.14.21), #14 (1.24.22), #15 (  3.15.22), #16 (05.24.22), #17 (8.9.22), #18 (10.18.22), #19 (01.03.23), #20 (03.29.23)   - pt was doing very well with q3 mo maintenance injections, but then developed interval increases in central SRF over several visits -- IVA resistance  - OCT today shows Interval increase in central SRF  overlying PEDS at 10 weeks -- IVA resistance  - BCVA stable at 20/30  - recommend IVE OD #1 today, 06.07.23, with follow up back to 6 wks  - pt wishes to be treated with IVE OD  - RBA of procedure discussed, questions answered  - IVE informed consent obtained and signed, 06.07.23 (OD)  - see procedure note  - f/u in 6 wks -- DFE/OCT/possible injection  2. Age related macular degeneration, non-exudative, left eye  - The incidence, anatomy, and pathology of dry AMD, risk of progression, and the AREDS and AREDS 2 study including smoking risks discussed with patient.  - intermediate stage  - recommend amsler grid monitoring  3,4. Hypertensive retinopathy OU  - discussed importance of tight BP control  - monitor  5. Mixed form age related cataracts OU  - The symptoms of cataract, surgical options, and treatments and risks were discussed with patient  - under the expert management of Dr. Herbert Deaner  - pt planning on cataract surgery w/ [facility] ~Nov 2023  6. Glaucoma Suspect OU  - IOP 14,17  - under the expert management of Dr. Herbert Deaner  Ophthalmic Meds Ordered this visit:  Meds ordered this encounter  Medications   aflibercept (EYLEA) SOLN 2 mg   Return in about 6 weeks (around 05/22/2022) for f/u exu ARMD OD, DFE, OCT.  There are no Patient Instructions on file for this visit.  This document serves as a record of services personally performed by Gardiner Sleeper, MD, PhD. It was created on their behalf by Orvan Falconer, an ophthalmic technician. The creation of this record is the provider's dictation and/or activities during the visit.    Electronically signed by: Orvan Falconer, OA, 04/10/22  4:12 PM  This document serves as a record of services personally performed by Gardiner Sleeper, MD, PhD. It was created on their behalf by San Jetty. Owens Shark, OA an ophthalmic technician. The creation of this record is the provider's dictation and/or activities during the visit.    Electronically  signed by: San Jetty. Owens Shark, [place] 06.07.2023 4:12 PM  Gardiner Sleeper, M.D., Ph.D. Diseases & Surgery of the Retina and Vitreous [facility]  I have reviewed the above documentation for accuracy and completeness, and I agree with the above. Gardiner Sleeper, M.D., Ph.D. 04/10/22 4:40 PM   Abbreviations: M myopia (nearsighted); A astigmatism; H hyperopia (farsighted); P presbyopia; Mrx spectacle prescription;  CTL contact lenses; OD right eye; OS left eye; OU both eyes  XT exotropia; ET esotropia; PEK punctate epithelial keratitis; PEE punctate epithelial erosions; DES dry eye syndrome; MGD meibomian gland dysfunction; ATs artificial tears; PFAT's preservative free artificial tears; [place] nuclear sclerotic cataract; PSC posterior subcapsular cataract; ERM epi-retinal membrane; PVD posterior vitreous detachment; RD retinal detachment; DM diabetes mellitus; DR diabetic retinopathy; NPDR non-proliferative diabetic retinopathy; PDR proliferative diabetic retinopathy; CSME clinically significant macular edema; DME diabetic macular edema; dbh dot blot hemorrhages; CWS cotton wool spot; POAG primary open angle glaucoma; C/D cup-to-disc ratio; HVF humphrey visual field; GVF goldmann visual field; OCT optical coherence tomography; IOP intraocular pressure; BRVO Branch retinal vein occlusion; CRVO central retinal vein occlusion; CRAO central retinal artery occlusion; BRAO branch retinal artery occlusion;  RT retinal tear; SB scleral buckle; PPV pars plana vitrectomy; VH Vitreous hemorrhage; PRP panretinal laser photocoagulation; IVK intravitreal kenalog; VMT vitreomacular traction; MH Macular hole;  NVD neovascularization of the disc; NVE neovascularization elsewhere; AREDS age related eye disease study; ARMD age related macular degeneration; POAG primary open angle glaucoma; EBMD epithelial/anterior basement membrane dystrophy; ACIOL anterior chamber intraocular lens; IOL intraocular lens; PCIOL  posterior chamber intraocular lens; Phaco/IOL phacoemulsification with intraocular lens placement; [place] photorefractive keratectomy; LASIK laser assisted in situ keratomileusis; HTN hypertension; DM diabetes mellitus; COPD chronic obstructive pulmonary disease

## 2022-04-09 ENCOUNTER — Encounter: Payer: Self-pay | Admitting: Physical Medicine and Rehabilitation

## 2022-04-09 ENCOUNTER — Ambulatory Visit (INDEPENDENT_AMBULATORY_CARE_PROVIDER_SITE_OTHER): Payer: Medicare Other | Admitting: Physical Medicine and Rehabilitation

## 2022-04-09 ENCOUNTER — Ambulatory Visit: Payer: Self-pay

## 2022-04-09 DIAGNOSIS — M25511 Pain in right shoulder: Secondary | ICD-10-CM | POA: Diagnosis not present

## 2022-04-09 DIAGNOSIS — G8929 Other chronic pain: Secondary | ICD-10-CM

## 2022-04-09 NOTE — Progress Notes (Unsigned)
Pt state right shoulder pain. Pt state lifting and any movement with her arm makes the pain worse. Pt state she takes pain meds to help ease her pain.  Numeric Pain Rating Scale and Functional Assessment Average Pain 3   In the last MONTH (on 0-10 scale) has pain interfered with the following?  1. General activity like being  able to carry out your everyday physical activities such as walking, climbing stairs, carrying groceries, or moving a chair?  Rating(10)    -BT, -Dye Allergies.

## 2022-04-09 NOTE — Progress Notes (Unsigned)
   Faith Reeves - 71 y.o. female MRN [id number]  Date of birth: 23-Nov-1950  Office Visit Note: Visit Date: 04/09/2022 PCP: Susy Frizzle, MD Referred by: Susy Frizzle, MD  Subjective: Chief Complaint  Patient presents with   Right Shoulder - Pain   HPI:  Faith Reeves is a 71 y.o. female who comes in todayHPI ROS Otherwise per HPI.  Assessment & Plan: Visit Diagnoses:    ICD-10-CM   1. Chronic right shoulder pain  M25.511 XR C-ARM NO REPORT   G89.29       Plan: No additional findings.   Meds & Orders: No orders of the defined types were placed in this encounter.   Orders Placed This Encounter  Procedures   Large Joint Inj   XR C-ARM NO REPORT    Follow-up: No follow-ups on file.   Procedures: Large Joint Inj: R glenohumeral on 04/09/2022 11:27 AM Indications: pain and diagnostic evaluation Details: 22 G 3.5 in needle, fluoroscopy-guided anteromedial approach  Arthrogram: No  Medications: 40 mg triamcinolone acetonide 40 MG/ML; 5 mL bupivacaine 0.25 % Outcome: tolerated well, no immediate complications  There was excellent flow of contrast producing a partial arthrogram of the glenohumeral joint. The patient did have relief of symptoms during the anesthetic phase of the injection. Procedure, treatment alternatives, risks and benefits explained, specific risks discussed. Consent was given by the patient. Immediately prior to procedure a time out was called to verify the correct patient, procedure, equipment, support staff and site/side marked as required. Patient was prepped and draped in the usual sterile fashion.         Clinical History: No specialty comments available.     Objective:  VS:  HT:    WT:   BMI:     BP:   HR: bpm  TEMP: ( )  RESP:  Physical Exam   Imaging: No results found.

## 2022-04-10 ENCOUNTER — Encounter (INDEPENDENT_AMBULATORY_CARE_PROVIDER_SITE_OTHER): Payer: Self-pay | Admitting: Ophthalmology

## 2022-04-10 ENCOUNTER — Ambulatory Visit (INDEPENDENT_AMBULATORY_CARE_PROVIDER_SITE_OTHER): Payer: Medicare Other | Admitting: Ophthalmology

## 2022-04-10 DIAGNOSIS — H25813 Combined forms of age-related cataract, bilateral: Secondary | ICD-10-CM | POA: Diagnosis not present

## 2022-04-10 DIAGNOSIS — H35033 Hypertensive retinopathy, bilateral: Secondary | ICD-10-CM

## 2022-04-10 DIAGNOSIS — H353211 Exudative age-related macular degeneration, right eye, with active choroidal neovascularization: Secondary | ICD-10-CM

## 2022-04-10 DIAGNOSIS — H353122 Nonexudative age-related macular degeneration, left eye, intermediate dry stage: Secondary | ICD-10-CM | POA: Diagnosis not present

## 2022-04-10 DIAGNOSIS — H40003 Preglaucoma, unspecified, bilateral: Secondary | ICD-10-CM

## 2022-04-10 DIAGNOSIS — I10 Essential (primary) hypertension: Secondary | ICD-10-CM | POA: Diagnosis not present

## 2022-04-10 MED ORDER — AFLIBERCEPT 2MG/0.05ML IZ SOLN FOR KALEIDOSCOPE
2.0000 mg | INTRAVITREAL | Status: AC | PRN
  Administered 2022-04-10: 2 mg via INTRAVITREAL

## 2022-04-10 MED ORDER — TRIAMCINOLONE ACETONIDE 40 MG/ML IJ SUSP
40.0000 mg | INTRAMUSCULAR | Status: AC | PRN
  Administered 2022-04-09: 40 mg via INTRA_ARTICULAR

## 2022-04-10 MED ORDER — BUPIVACAINE HCL 0.25 % IJ SOLN
5.0000 mL | INTRAMUSCULAR | Status: AC | PRN
  Administered 2022-04-09: 5 mL via INTRA_ARTICULAR

## 2022-04-11 ENCOUNTER — Encounter: Payer: Medicare Other | Admitting: Physical Therapy

## 2022-04-15 ENCOUNTER — Encounter: Payer: Self-pay | Admitting: Physical Therapy

## 2022-04-15 ENCOUNTER — Ambulatory Visit (INDEPENDENT_AMBULATORY_CARE_PROVIDER_SITE_OTHER): Payer: Medicare Other | Admitting: Physical Therapy

## 2022-04-15 DIAGNOSIS — G8929 Other chronic pain: Secondary | ICD-10-CM

## 2022-04-15 DIAGNOSIS — M542 Cervicalgia: Secondary | ICD-10-CM

## 2022-04-15 DIAGNOSIS — M25511 Pain in right shoulder: Secondary | ICD-10-CM

## 2022-04-15 DIAGNOSIS — M6281 Muscle weakness (generalized): Secondary | ICD-10-CM | POA: Diagnosis not present

## 2022-04-15 NOTE — Therapy (Signed)
OUTPATIENT PHYSICAL THERAPY TREATMENT NOTE   Patient Name: Faith Reeves MRN: [id number] DOB:[birth year], 71 y.o., female Today's Date: 04/15/2022  END OF SESSION:   PT End of Session - 04/15/22 1305     Visit Number 2    Number of Visits 15    Date for PT Re-Evaluation 06/20/22    Authorization Type MCR    Progress Note Due on Visit 10    PT Start Time 1300    PT Stop Time 1345    PT Time Calculation (min) 45 min    Activity Tolerance Patient tolerated treatment well    Behavior During Therapy WFL for tasks assessed/performed             Past Medical History:  Diagnosis Date   Allergy    Anemia    Anxiety    Blood transfusion without reported diagnosis    Cataract    GERD (gastroesophageal reflux disease)    Hypertension    Hypertensive retinopathy    OU   Macular degeneration    Past Surgical History:  Procedure Laterality Date   ABDOMINAL HYSTERECTOMY     ACHILLES TENDON REPAIR Bilateral    APPENDECTOMY     BACK SURGERY     BILATERAL CARPAL TUNNEL RELEASE     CHOLECYSTECTOMY     COLONOSCOPY     HERNIA REPAIR     JOINT REPLACEMENT     MELANOMA EXCISION Right 09/15/2017   Procedure: EXCISION LIPOSARCOMA RIGHT ARM;  Surgeon: Georganna Skeans, MD;  Location: [place];  Service: General;  Laterality: Right;   SPINE SURGERY     Patient Active Problem List   Diagnosis Date Noted   Macular degeneration of right eye 12/18/2016   Allergy    Anxiety    Blood transfusion without reported diagnosis    Cataract    Hypertension       THERAPY DIAG:  Cervicalgia  Chronic right shoulder pain  Muscle weakness (generalized)  PCP: Susy Frizzle, MD   REFERRING PROVIDER: Aundra Dubin, PA-C   REFERRING DIAG: [phone] (ICD-10-CM) - Chronic right shoulder pain M54.2 (ICD-10-CM) - Neck pain   THERAPY DIAG:  Cervicalgia   Chronic right shoulder pain   Muscle weakness (generalized)   Rationale for Evaluation and Treatment Rehabilitation   ONSET  DATE: chronic pain since   SUBJECTIVE:  SUBJECTIVE STATEMENT: Relays shoulder pain is not too good today, her son had a fall and she had to help pick him up which aggravated things.    PERTINENT HISTORY:  anx, HTN, spine surgery, achillies tendon repair, left hip replacement, carpal tunnel surgery   PAIN:  Are you having pain? Yes: NPRS scale: 8/10 Pain location: Rt neck and shoulder Pain description: shooting and can have some intermittent N/T down Rt arm Aggravating factors: reaching,laying on your right, moving, vacuum, Relieving factors: rest   PRECAUTIONS: None   WEIGHT BEARING RESTRICTIONS No   FALLS:  Has patient fallen in last 6 months? No   OCCUPATION: retired   PLOF: Independent with basic ADL's   PATIENT GOALS reduce pain   OBJECTIVE:    DIAGNOSTIC FINDINGS:  XR Shoulder Right  03/13/2022 "Moderate glenohumeral and AC joint degenerative changes " neck XR 03/13/22 " Multilevel degenerative changes"     PATIENT SURVEYS:  5/25/23FOTO 45% functional     COGNITION: Overall cognitive status: Within functional limits for tasks assessed     SENSATION: WFL   POSTURE: fwd posture with scoliosis noted   PALPATION: TTP with trigger points in Rt upper trap, infraspinatus, deltoids             CERVICAL ROM:    Active ROM A/PROM (deg) eval  Flexion WNL and no pain  Extension 75% and pain on right side  Right lateral flexion WNL  Left lateral flexion 25% and pulling  Right rotation 75% and pain on Rt  Left rotation 75% and pain on Rt   (Blank rows = not tested)   UPPER EXTREMITY ROM: 03/28/22 PROM was [facility] for abduction and flexion   Active ROM Right eval Left eval  Shoulder flexion 80    Shoulder extension      Shoulder abduction 70    Shoulder adduction       Shoulder extension      Shoulder internal rotation Unable to reach behind back    Shoulder external rotation C5 behind head      Elbow flexion      Elbow extension      Wrist flexion      Wrist extension      Wrist ulnar deviation      Wrist radial deviation      Wrist pronation      Wrist supination       (Blank rows = not tested)   UPPER EXTREMITY MMT:   MMT Right eval Left eval  Shoulder flexion 2    Shoulder extension 4    Shoulder abduction 2    Shoulder adduction      Shoulder extension      Shoulder internal rotation 4    Shoulder external rotation 4-    Middle trapezius      Lower trapezius      Elbow flexion 4+    Elbow extension 4+    Wrist flexion      Wrist extension      Wrist ulnar deviation      Wrist radial deviation      Wrist pronation      Wrist supination      Grip strength       (Blank rows = not tested)   SPECIAL TESTS:  03/28/22            Negative cervical spurlings test            + impingement tests for Rt shoulder  FUNCTIONAL TESTS:        TODAY'S TREATMENT:  04/15/22 UBE L1 2 min fwd, 2 min retro Upper trap stretch 30 sec X 2 for Rt side Attempted levator stretch but she did not feel stretch with this so discontinues Shoulder rolls X 10 posterior and X 10 anterior Bilat ER AROM with scapular retraction 5 sec X 10 Standing rows and extensions with red 2X10 bilat Standing ER with red for Rt X 10 Standing IR with red for Rt X 15 Supine AAROM shoulder flexion and abd 1# bar X 10 ea  Manual therapy: in sitting for STM and T.P release to Rt traps, deltoids, infra and supraspinatus  Modalities: Moist heat to Rt shoulder/neck X 7 min at end of session     PATIENT EDUCATION:  Education details: HEP, PT plan of care Person educated: Patient Education method: Explanation, Demonstration, Verbal cues, and Handouts Education comprehension: verbalized understanding and needs further education     HOME EXERCISE PROGRAM: Access  Code: [email or web address] URL: [email or web address] Date: 03/28/2022 Prepared by: Elsie Ra   Exercises - Supine Shoulder Internal Rotation Stretch  - 1-2 x daily - 6 x weekly - 3 sets - 5 reps - 10 sec hold - Standing Shoulder Flexion Wall Slide  - 1-2 x daily - 6 x weekly - 1 sets - 10 reps - Standing Shoulder Abduction Slides at Wall  - 1-2 x daily - 6 x weekly - 1 sets - 10 reps - Standing Shoulder Row with Anchored Resistance  - 1-2 x daily - 6 x weekly - 1 sets - 10 reps - Shoulder Extension with Resistance - Neutral  - 1-2 x daily - 6 x weekly - 2 sets - 10 reps - Shoulder External Rotation with Anchored Resistance  - 1-2 x daily - 6 x weekly - 2 sets - 10 reps - Shoulder Internal Rotation with Resistance  - 1-2 x daily - 6 x weekly - 2 sets - 10 reps   ASSESSMENT:   CLINICAL IMPRESSION: She has been doing HEP but had difficulty with resisted ER, I showed her how to reduce tension with this by making one side of the band longer to reduce tension. She showed improved overall shoulder flexion ROM today. I did trial manual therapy for STM and T.P. release so we will see if this helps any as well.   OBJECTIVE IMPAIRMENTS: decreased activity tolerance, decreased shoulder mobility, decreased ROM, decreased strength, impaired flexibility, impaired UE use, postural dysfunction, and pain.   ACTIVITY LIMITATIONS: reaching, lifting, carry,  cleaning, driving, and sleeping   PERSONAL FACTORS: anx, HTN, spine surgery, scoliosis, achillies tendon repair are also affecting patient's functional outcome.   REHAB POTENTIAL: Good   CLINICAL DECISION MAKING: moderate    EVALUATION COMPLEXITY: moderate       GOALS: Short term PT Goals Target date: 04/25/2022 Pt will be I and compliant with HEP. Baseline:  Goal status: New Pt will decrease pain by 25% overall Baseline: Goal status: New   Long term PT goals Target date: 06/20/2022 Pt will improve Rt shoulder AROM to [facility] to improve  functional reaching Baseline: Goal status: New Pt will improve  Rt shoulder strength to at least 4+/5 MMT to improve functional strength Baseline: Goal status: New Pt will improve FOTO to at least 60% functional to show improved function Baseline: Goal status: New Pt will reduce pain 50% with usual ADL's and sleeping Baseline: Goal status: New   PLAN: PT FREQUENCY: 1-2 times per week  PT DURATION: 12 weeks   PLANNED INTERVENTIONS (unless contraindicated): aquatic PT, Canalith repositioning, cryotherapy, Electrical stimulation, Iontophoresis with 4 mg/ml dexamethasome, Moist heat, traction, Ultrasound, gait training, Therapeutic exercise, balance training, neuromuscular re-education, patient/family education, prosthetic training, manual techniques, passive ROM, dry needling, taping, vasopnuematic device, vestibular, spinal manipulations, joint manipulations   PLAN FOR NEXT SESSION: needs gentle progressions, how was manual therapy last time, consider massage gun.    Debbe Odea, PT,DPT 04/15/2022, 1:31 PM

## 2022-04-22 ENCOUNTER — Encounter: Payer: Self-pay | Admitting: Physical Therapy

## 2022-04-22 ENCOUNTER — Ambulatory Visit (INDEPENDENT_AMBULATORY_CARE_PROVIDER_SITE_OTHER): Payer: Medicare Other | Admitting: Physical Therapy

## 2022-04-22 DIAGNOSIS — M6281 Muscle weakness (generalized): Secondary | ICD-10-CM

## 2022-04-22 DIAGNOSIS — G8929 Other chronic pain: Secondary | ICD-10-CM

## 2022-04-22 DIAGNOSIS — M542 Cervicalgia: Secondary | ICD-10-CM

## 2022-04-22 DIAGNOSIS — M25511 Pain in right shoulder: Secondary | ICD-10-CM

## 2022-04-22 NOTE — Therapy (Addendum)
OUTPATIENT PHYSICAL THERAPY TREATMENT NOTE PHYSICAL THERAPY DISCHARGE SUMMARY  Visits from Start of Care: 3  Current functional level related to goals / functional outcomes: See below   Remaining deficits: See below   Education / Equipment: HEP  Plan:  Patient goals were not met. Patient is being discharged due to not returning since last visit. Elsie Ra, PT, DPT 06/13/22 3:33 PM       Patient Name: Faith Reeves MRN: [id number] DOB:25-Mar-1951, 71 y.o., female Today's Date: 04/22/2022  END OF SESSION:   PT End of Session - 04/22/22 1300     Visit Number 3    Number of Visits 15    Date for PT Re-Evaluation 06/20/22    Authorization Type MCR    Progress Note Due on Visit 10    PT Start Time 1255    PT Stop Time 1333    PT Time Calculation (min) 38 min    Activity Tolerance Patient tolerated treatment well    Behavior During Therapy WFL for tasks assessed/performed             Past Medical History:  Diagnosis Date   Allergy    Anemia    Anxiety    Blood transfusion without reported diagnosis    Cataract    GERD (gastroesophageal reflux disease)    Hypertension    Hypertensive retinopathy    OU   Macular degeneration    Past Surgical History:  Procedure Laterality Date   ABDOMINAL HYSTERECTOMY     ACHILLES TENDON REPAIR Bilateral    APPENDECTOMY     BACK SURGERY     BILATERAL CARPAL TUNNEL RELEASE     CHOLECYSTECTOMY     COLONOSCOPY     HERNIA REPAIR     JOINT REPLACEMENT     MELANOMA EXCISION Right 09/15/2017   Procedure: EXCISION LIPOSARCOMA RIGHT ARM;  Surgeon: Georganna Skeans, MD;  Location: [place];  Service: General;  Laterality: Right;   SPINE SURGERY     Patient Active Problem List   Diagnosis Date Noted   Macular degeneration of right eye 12/18/2016   Allergy    Anxiety    Blood transfusion without reported diagnosis    Cataract    Hypertension       THERAPY DIAG:  Cervicalgia  Chronic right shoulder pain  Muscle  weakness (generalized)  PCP: Susy Frizzle, MD   REFERRING PROVIDER: Aundra Dubin, PA-C   REFERRING DIAG: [phone] (ICD-10-CM) - Chronic right shoulder pain M54.2 (ICD-10-CM) - Neck pain   Rationale for Evaluation and Treatment Rehabilitation   ONSET DATE: chronic pain since   SUBJECTIVE:  SUBJECTIVE STATEMENT: Relays the pain in her neck and shoulder are fine today, but her Rt knee is bothering her. She has a 300# son with special needs who has fallen a lot and she has to help lift him back up with her husband so this can cause pain.    PERTINENT HISTORY:  anx, HTN, spine surgery, achillies tendon repair, left hip replacement, carpal tunnel surgery   PAIN:  Are you having pain? not having pain in neck and shoulder upon arrival. NPRS scale:  Pain location: Rt neck and shoulder Pain description: shooting and can have some intermittent N/T down Rt arm Aggravating factors: reaching,laying on your right, moving, vacuum, Relieving factors: rest   PRECAUTIONS: None   WEIGHT BEARING RESTRICTIONS No   FALLS:  Has patient fallen in last 6 months? No   OCCUPATION: retired   PLOF: Independent with basic ADL's   PATIENT GOALS reduce pain   OBJECTIVE:    DIAGNOSTIC FINDINGS:  XR Shoulder Right  03/13/2022 "Moderate glenohumeral and AC joint degenerative changes " neck XR 03/13/22 " Multilevel degenerative changes"     PATIENT SURVEYS:  5/25/23FOTO 45% functional     COGNITION: Overall cognitive status: Within functional limits for tasks assessed     SENSATION: WFL   POSTURE: fwd posture with scoliosis noted   PALPATION: TTP with trigger points in Rt upper trap, infraspinatus, deltoids             CERVICAL ROM:    Active ROM A/PROM (deg) eval  Flexion WNL  and no pain  Extension 75% and pain on right side  Right lateral flexion WNL  Left lateral flexion 25% and pulling  Right rotation 75% and pain on Rt  Left rotation 75% and pain on Rt   (Blank rows = not tested)   UPPER EXTREMITY ROM: 03/28/22 PROM was [facility] for abduction and flexion   Active ROM Right eval Left eval  Shoulder flexion 80    Shoulder extension      Shoulder abduction 70    Shoulder adduction      Shoulder extension      Shoulder internal rotation Unable to reach behind back    Shoulder external rotation C5 behind head      Elbow flexion      Elbow extension      Wrist flexion      Wrist extension      Wrist ulnar deviation      Wrist radial deviation      Wrist pronation      Wrist supination       (Blank rows = not tested)   UPPER EXTREMITY MMT:   MMT Right eval Left eval  Shoulder flexion 2    Shoulder extension 4    Shoulder abduction 2    Shoulder adduction      Shoulder extension      Shoulder internal rotation 4    Shoulder external rotation 4-    Middle trapezius      Lower trapezius      Elbow flexion 4+    Elbow extension 4+    Wrist flexion      Wrist extension      Wrist ulnar deviation      Wrist radial deviation      Wrist pronation      Wrist supination      Grip strength       (Blank rows = not tested)   SPECIAL TESTS:  03/28/22  Negative cervical spurlings test            + impingement tests for Rt shoulder   FUNCTIONAL TESTS:        TODAY'S TREATMENT:  04/22/22 Nu step L4 UE/LE X 5 min  Upper trap stretch 30 sec X 2 for Rt side Shoulder rolls X 20 posterior Sitting AAROM shoulder flexion and abduction with 1# bar X 15 each on Rt Bilat ER AROM with scapular retraction 5 sec X 15 Standing rows and extensions with green 2X10 bilat Standing ER with red for Rt 2X10 Standing IR with red for Rt 2X10 Posture correction in doorway leaning back against doorframe and reaching up and back holding for one deep breath  X 5 reps  Manual therapy: in sitting, massage gun to Rt traps, rhomboids,deltoids, infra and supraspinatus  Modalities: Deferred this session, feeling good  04/15/22 UBE L1 2 min fwd, 2 min retro Upper trap stretch 30 sec X 2 for Rt side Attempted levator stretch but she did not feel stretch with this so discontinues Shoulder rolls X 10 posterior and X 10 anterior Bilat ER AROM with scapular retraction 5 sec X 10 Standing rows and extensions with red 2X10 bilat Standing ER with red for Rt X 10 Standing IR with red for Rt X 15 Supine AAROM shoulder flexion and abd 1# bar X 10 ea  Manual therapy: in sitting for STM and T.P release to Rt traps, deltoids, infra and supraspinatus  Modalities: Moist heat to Rt shoulder/neck X 7 min at end of session     PATIENT EDUCATION:  Education details: HEP, PT plan of care Person educated: Patient Education method: Explanation, Demonstration, Verbal cues, and Handouts Education comprehension: verbalized understanding and needs further education     HOME EXERCISE PROGRAM: Access Code: [email or web address] URL: [email or web address] Date: 03/28/2022 Prepared by: Elsie Ra   Exercises - Supine Shoulder Internal Rotation Stretch  - 1-2 x daily - 6 x weekly - 3 sets - 5 reps - 10 sec hold - Standing Shoulder Flexion Wall Slide  - 1-2 x daily - 6 x weekly - 1 sets - 10 reps - Standing Shoulder Abduction Slides at Wall  - 1-2 x daily - 6 x weekly - 1 sets - 10 reps - Standing Shoulder Row with Anchored Resistance  - 1-2 x daily - 6 x weekly - 1 sets - 10 reps - Shoulder Extension with Resistance - Neutral  - 1-2 x daily - 6 x weekly - 2 sets - 10 reps - Shoulder External Rotation with Anchored Resistance  - 1-2 x daily - 6 x weekly - 2 sets - 10 reps - Shoulder Internal Rotation with Resistance  - 1-2 x daily - 6 x weekly - 2 sets - 10 reps   ASSESSMENT:   CLINICAL IMPRESSION: She has met short term PT goals thus far and does appear to be  progressing with PT. She had good tolerance to exercise progression today.    OBJECTIVE IMPAIRMENTS: decreased activity tolerance, decreased shoulder mobility, decreased ROM, decreased strength, impaired flexibility, impaired UE use, postural dysfunction, and pain.   ACTIVITY LIMITATIONS: reaching, lifting, carry,  cleaning, driving, and sleeping   PERSONAL FACTORS: anx, HTN, spine surgery, scoliosis, achillies tendon repair are also affecting patient's functional outcome.   REHAB POTENTIAL: Good   CLINICAL DECISION MAKING: moderate    EVALUATION COMPLEXITY: moderate       GOALS: Short term PT Goals Target date: 04/25/2022 Pt will be I and compliant with  HEP. Baseline:  Goal status: MET 6/19 Pt will decrease pain by 25% overall Baseline: Goal status: MET 6/19   Long term PT goals Target date: 06/20/2022 Pt will improve Rt shoulder AROM to [facility] to improve functional reaching Baseline: Goal status: New Pt will improve  Rt shoulder strength to at least 4+/5 MMT to improve functional strength Baseline: Goal status: New Pt will improve FOTO to at least 60% functional to show improved function Baseline: Goal status: New Pt will reduce pain 50% with usual ADL's and sleeping Baseline: Goal status: New   PLAN: PT FREQUENCY: 1-2 times per week    PT DURATION: 12 weeks   PLANNED INTERVENTIONS (unless contraindicated): aquatic PT, Canalith repositioning, cryotherapy, Electrical stimulation, Iontophoresis with 4 mg/ml dexamethasome, Moist heat, traction, Ultrasound, gait training, Therapeutic exercise, balance training, neuromuscular re-education, patient/family education, prosthetic training, manual techniques, passive ROM, dry needling, taping, vasopnuematic device, vestibular, spinal manipulations, joint manipulations   PLAN FOR NEXT SESSION: needs gentle progressions, postural corrective exercises, how was massage gun last time?    Debbe Odea, PT,DPT 04/22/2022, 1:38 PM

## 2022-04-26 ENCOUNTER — Telehealth: Payer: Self-pay

## 2022-04-26 ENCOUNTER — Other Ambulatory Visit: Payer: Self-pay | Admitting: Family Medicine

## 2022-04-26 MED ORDER — ALPRAZOLAM 0.5 MG PO TABS: 0.5000 mg | ORAL_TABLET | Freq: Three times a day (TID) | ORAL | 0 refills | Status: DC | PRN

## 2022-04-26 NOTE — Telephone Encounter (Signed)
Pt called in stating that her son has passed away, and is experiencing a lot of anxiety. Pt wanted to know if she could get something for anxiety sent into pharmacy.   Cb#: [phone]

## 2022-04-29 ENCOUNTER — Encounter: Payer: Medicare Other | Admitting: Physical Therapy

## 2022-05-05 ENCOUNTER — Other Ambulatory Visit: Payer: Self-pay | Admitting: Family Medicine

## 2022-05-06 ENCOUNTER — Encounter: Payer: Medicare Other | Admitting: Physical Therapy

## 2022-05-06 MED ORDER — TELMISARTAN-HCTZ 80-12.5 MG PO TABS: 1.0000 | ORAL_TABLET | Freq: Every day | ORAL | 0 refills | Status: DC

## 2022-05-06 NOTE — Telephone Encounter (Signed)
Requested medication (s) are due for refill today - yes  Requested medication (s) are on the active medication list -yes  Future visit scheduled -no  Last refill: 01/10/22 #30 3RF  Notes to clinic: Duplicate request- non delegated Rx  Requested Prescriptions  Pending Prescriptions Disp Refills   zolpidem (AMBIEN) 10 MG tablet [Pharmacy Med Name: ZOLPIDEM TARTRATE 10 MG TABLET] 30 tablet     Sig: TAKE 1 TABLET BY MOUTH AT BEDTIME AS NEEDED FOR SLEEP     Not Delegated - Psychiatry:  Anxiolytics/Hypnotics Failed - 05/06/2022  4:21 PM      Failed - This refill cannot be delegated      Failed - Urine Drug Screen completed in last 360 days      Passed - Valid encounter within last 6 months    Recent Outpatient Visits           2 months ago Benign essential HTN   [place] Dennard Schaumann, Cammie Mcgee, MD   1 year ago Benign essential HTN   [place] Dennard Schaumann, Cammie Mcgee, MD   1 year ago Benign essential HTN   [place], Warren T, MD   2 years ago Acute idiopathic gout of right wrist   [place] Pickard, Cammie Mcgee, MD   2 years ago General medical exam   [place] Susy Frizzle, MD              Signed Prescriptions Disp Refills   gabapentin (NEURONTIN) 300 MG capsule 90 capsule 1    Sig: TAKE 1 CAPSULE BY MOUTH THREE TIMES A DAY     Neurology: Anticonvulsants - gabapentin Failed - 05/06/2022 12:19 PM      Failed - Cr in normal range and within 360 days    Creat  Date Value Ref Range Status  02/19/2022 1.10 (H) 0.60 - 1.00 mg/dL Final         Failed - Completed PHQ-2 or PHQ-9 in the last 360 days      Passed - Valid encounter within last 12 months    Recent Outpatient Visits           2 months ago Benign essential HTN   [place] Dennard Schaumann, Cammie Mcgee, MD   1 year ago Benign essential HTN   [place] Dennard Schaumann, Cammie Mcgee, MD   1 year ago Benign  essential HTN   [place], Warren T, MD   2 years ago Acute idiopathic gout of right wrist   [place] Dennard Schaumann, Cammie Mcgee, MD   2 years ago General medical exam   [place] Susy Frizzle, MD               telmisartan-hydrochlorothiazide (MICARDIS HCT) 80-12.5 MG tablet 90 tablet 0    Sig: Take 1 tablet by mouth daily.     Cardiovascular: ARB + Diuretic Combos Failed - 05/06/2022  4:21 PM      Failed - Cr in normal range and within 180 days    Creat  Date Value Ref Range Status  02/19/2022 1.10 (H) 0.60 - 1.00 mg/dL Final         Passed - K in normal range and within 180 days    Potassium  Date Value Ref Range Status  02/19/2022 4.0 3.5 - 5.3 mmol/L Final         Passed -  Na in normal range and within 180 days    Sodium  Date Value Ref Range Status  02/19/2022 143 135 - 146 mmol/L Final         Passed - eGFR is 10 or above and within 180 days    GFR, Est African American  Date Value Ref Range Status  03/12/2021 55 (L) > OR = 60 mL/min/1.96m Final   GFR, Est Non African American  Date Value Ref Range Status  03/12/2021 47 (L) > OR = 60 mL/min/1.726mFinal   eGFR  Date Value Ref Range Status  02/19/2022 53 (L) > OR = 60 mL/min/1.7325minal    Comment:    The eGFR is based on the CKD-EPI 2021 equation. To calculate  the new eGFR from a previous Creatinine or Cystatin C result, go to https://www.kidney.org/professionals/ kdoqi/gfr%5Fcalculator          Passed - Patient is not pregnant      Passed - Last BP in normal range    BP Readings from Last 1 Encounters:  03/06/22 120/80         Passed - Valid encounter within last 6 months    Recent Outpatient Visits           2 months ago Benign essential HTN   [place], WarCammie McgeeD   1 year ago Benign essential HTN   [place] SchaumannarCammie McgeeD   1 year ago Benign essential HTN   [place] FrizzleD   2 years ago Acute idiopathic gout of right wrist   [place]ard, WarCammie McgeeD   2 years ago General medical exam   Bro[place]ckard, WarCammie McgeeD                 Requested Prescriptions  Pending Prescriptions Disp Refills   zolpidem (AMBIEN) 10 MG tablet [Pharmacy Med Name: ZOLPIDEM TARTRATE 10 MG TABLET] 30 tablet     Sig: TAKE 1 TABLET BY MOUTH AT BEDTIME AS NEEDED FOR SLEEP     Not Delegated - Psychiatry:  Anxiolytics/Hypnotics Failed - 05/06/2022  4:21 PM      Failed - This refill cannot be delegated      Failed - Urine Drug Screen completed in last 360 days      Passed - Valid encounter within last 6 months    Recent Outpatient Visits           2 months ago Benign essential HTN   [place]y FrizzleD   1 year ago Benign essential HTN   [place]enn[place] SchaumannarCammie McgeeD   1 year ago Benign essential HTN   Bro[place]wightcSusy FrizzleD   2 years ago Acute idiopathic gout of right wrist   [place]enn[place] SchaumannarCammie McgeeD   2 years ago General medical exam   [place] HorsecSusy FrizzleD              Signed Prescriptions Disp Refills   gabapentin (NEURONTIN) 300 MG capsule 90 capsule 1    Sig: TAKE 1 CAPSULE BY MOUTH THREE TIMES A DAY     Neurology: Anticonvulsants - gabapentin Failed - 05/06/2022 12:19 PM      Failed - Cr in normal range and within 360 days    Creat  Date  Value Ref Range Status  02/19/2022 1.10 (H) 0.60 - 1.00 mg/dL Final         Failed - Completed PHQ-2 or PHQ-9 in the last 360 days      Passed - Valid encounter within last 12 months    Recent Outpatient Visits           2 months ago Benign essential HTN   [place] Pickard, Cammie Mcgee, MD   1 year ago Benign essential HTN   [place] Dennard Schaumann, Cammie Mcgee,  MD   1 year ago Benign essential HTN   [place] Susy Frizzle, MD   2 years ago Acute idiopathic gout of right wrist   [place] Dennard Schaumann Cammie Mcgee, MD   2 years ago General medical exam   [place] Susy Frizzle, MD               telmisartan-hydrochlorothiazide (MICARDIS HCT) 80-12.5 MG tablet 90 tablet 0    Sig: Take 1 tablet by mouth daily.     Cardiovascular: ARB + Diuretic Combos Failed - 05/06/2022  4:21 PM      Failed - Cr in normal range and within 180 days    Creat  Date Value Ref Range Status  02/19/2022 1.10 (H) 0.60 - 1.00 mg/dL Final         Passed - K in normal range and within 180 days    Potassium  Date Value Ref Range Status  02/19/2022 4.0 3.5 - 5.3 mmol/L Final         Passed - Na in normal range and within 180 days    Sodium  Date Value Ref Range Status  02/19/2022 143 135 - 146 mmol/L Final         Passed - eGFR is 10 or above and within 180 days    GFR, Est African American  Date Value Ref Range Status  03/12/2021 55 (L) > OR = 60 mL/min/1.52m Final   GFR, Est Non African American  Date Value Ref Range Status  03/12/2021 47 (L) > OR = 60 mL/min/1.796mFinal   eGFR  Date Value Ref Range Status  02/19/2022 53 (L) > OR = 60 mL/min/1.734minal    Comment:    The eGFR is based on the CKD-EPI 2021 equation. To calculate  the new eGFR from a previous Creatinine or Cystatin C result, go to https://www.kidney.org/professionals/ kdoqi/gfr%5Fcalculator          Passed - Patient is not pregnant      Passed - Last BP in normal range    BP Readings from Last 1 Encounters:  03/06/22 120/80         Passed - Valid encounter within last 6 months    Recent Outpatient Visits           2 months ago Benign essential HTN   BroBishopcDennard SchaumannarCammie McgeeD   1 year ago Benign essential HTN   BroPrimgharcDennard SchaumannrCammie McgeeD   1 year ago Benign  essential HTN   BroArmourcSusy FrizzleD   2 years ago Acute idiopathic gout of right wrist   BroWimbledoncDennard SchaumannrCammie McgeeD   2 years ago General medical exam   BroHelena Valley West Centralckard, WarCammie McgeeD

## 2022-05-06 NOTE — Telephone Encounter (Signed)
Requested Prescriptions  Pending Prescriptions Disp Refills  . gabapentin (NEURONTIN) 300 MG capsule [Pharmacy Med Name: GABAPENTIN 300 MG CAPSULE] 90 capsule 1    Sig: TAKE 1 CAPSULE BY MOUTH THREE TIMES A DAY     Neurology: Anticonvulsants - gabapentin Failed - 05/06/2022 12:19 PM      Failed - Cr in normal range and within 360 days    Creat  Date Value Ref Range Status  02/19/2022 1.10 (H) 0.60 - 1.00 mg/dL Final         Failed - Completed PHQ-2 or PHQ-9 in the last 360 days      Passed - Valid encounter within last 12 months    Recent Outpatient Visits          2 months ago Benign essential HTN   [place] Pickard, Cammie Mcgee, MD   1 year ago Benign essential HTN   [place] Dennard Schaumann, Cammie Mcgee, MD   1 year ago Benign essential HTN   [place] Susy Frizzle, MD   2 years ago Acute idiopathic gout of right wrist   [place] Pickard, Cammie Mcgee, MD   2 years ago General medical exam   [place] Dennard Schaumann, Cammie Mcgee, MD             . zolpidem (AMBIEN) 10 MG tablet [Pharmacy Med Name: ZOLPIDEM TARTRATE 10 MG TABLET] 30 tablet     Sig: TAKE 1 TABLET BY MOUTH AT BEDTIME AS NEEDED FOR SLEEP     Not Delegated - Psychiatry:  Anxiolytics/Hypnotics Failed - 05/06/2022 12:19 PM      Failed - This refill cannot be delegated      Failed - Urine Drug Screen completed in last 360 days      Passed - Valid encounter within last 6 months    Recent Outpatient Visits          2 months ago Benign essential HTN   [place] Susy Frizzle, MD   1 year ago Benign essential HTN   [place] Dennard Schaumann, Cammie Mcgee, MD   1 year ago Benign essential HTN   [place] Susy Frizzle, MD   2 years ago Acute idiopathic gout of right wrist   [place] Pickard, Cammie Mcgee, MD   2 years ago General medical exam   [place] Pickard, Cammie Mcgee, MD

## 2022-05-06 NOTE — Telephone Encounter (Signed)
Requested Prescriptions  Pending Prescriptions Disp Refills  . zolpidem (AMBIEN) 10 MG tablet [Pharmacy Med Name: ZOLPIDEM TARTRATE 10 MG TABLET] 30 tablet     Sig: TAKE 1 TABLET BY MOUTH AT BEDTIME AS NEEDED FOR SLEEP     Not Delegated - Psychiatry:  Anxiolytics/Hypnotics Failed - 05/06/2022  4:21 PM      Failed - This refill cannot be delegated      Failed - Urine Drug Screen completed in last 360 days      Passed - Valid encounter within last 6 months    Recent Outpatient Visits          2 months ago Benign essential HTN   [place] Susy Frizzle, MD   1 year ago Benign essential HTN   [place] Dennard Schaumann, Cammie Mcgee, MD   1 year ago Benign essential HTN   [place] Susy Frizzle, MD   2 years ago Acute idiopathic gout of right wrist   [place] Dennard Schaumann, Cammie Mcgee, MD   2 years ago General medical exam   [place] Susy Frizzle, MD             . telmisartan-hydrochlorothiazide (MICARDIS HCT) 80-12.5 MG tablet 90 tablet 0    Sig: Take 1 tablet by mouth daily.     Cardiovascular: ARB + Diuretic Combos Failed - 05/06/2022  4:21 PM      Failed - Cr in normal range and within 180 days    Creat  Date Value Ref Range Status  02/19/2022 1.10 (H) 0.60 - 1.00 mg/dL Final         Passed - K in normal range and within 180 days    Potassium  Date Value Ref Range Status  02/19/2022 4.0 3.5 - 5.3 mmol/L Final         Passed - Na in normal range and within 180 days    Sodium  Date Value Ref Range Status  02/19/2022 143 135 - 146 mmol/L Final         Passed - eGFR is 10 or above and within 180 days    GFR, Est African American  Date Value Ref Range Status  03/12/2021 55 (L) > OR = 60 mL/min/1.46m Final   GFR, Est Non African American  Date Value Ref Range Status  03/12/2021 47 (L) > OR = 60 mL/min/1.771mFinal   eGFR  Date Value Ref Range Status  02/19/2022 53 (L) > OR  = 60 mL/min/1.7354minal    Comment:    The eGFR is based on the CKD-EPI 2021 equation. To calculate  the new eGFR from a previous Creatinine or Cystatin C result, go to https://www.kidney.org/professionals/ kdoqi/gfr%5Fcalculator          Passed - Patient is not pregnant      Passed - Last BP in normal range    BP Readings from Last 1 Encounters:  03/06/22 120/80         Passed - Valid encounter within last 6 months    Recent Outpatient Visits          2 months ago Benign essential HTN   BroFle[place] Islandckard, WarCammie McgeeD   1 year ago Benign essential HTN   Bro[place]ce[place] Millsckard, WarCammie McgeeD   1 year ago Benign essential HTN   BroMicroarCammie McgeeD   2 years  ago Acute idiopathic gout of right wrist   [place] Pickard, Cammie Mcgee, MD   2 years ago General medical exam   [place] Susy Frizzle, MD             Signed Prescriptions Disp Refills   gabapentin (NEURONTIN) 300 MG capsule 90 capsule 1    Sig: TAKE 1 CAPSULE BY MOUTH THREE TIMES A DAY     Neurology: Anticonvulsants - gabapentin Failed - 05/06/2022 12:19 PM      Failed - Cr in normal range and within 360 days    Creat  Date Value Ref Range Status  02/19/2022 1.10 (H) 0.60 - 1.00 mg/dL Final         Failed - Completed PHQ-2 or PHQ-9 in the last 360 days      Passed - Valid encounter within last 12 months    Recent Outpatient Visits          2 months ago Benign essential HTN   [place] Susy Frizzle, MD   1 year ago Benign essential HTN   [place] Dennard Schaumann, Cammie Mcgee, MD   1 year ago Benign essential HTN   [place] Susy Frizzle, MD   2 years ago Acute idiopathic gout of right wrist   [place] Pickard, Cammie Mcgee, MD   2 years ago General medical exam   [place] Pickard, Cammie Mcgee, MD

## 2022-05-06 NOTE — Telephone Encounter (Signed)
Pharmacy faxed a refill request for telmisartan-hydrochlorothiazide (MICARDIS HCT) 80-12.5 MG tablet [[id number]]    Order Details Dose, Route, Frequency: As Directed  Dispense Quantity: 90 tablet Refills: 3        Sig: TAKE 1 TABLET BY MOUTH EVERY DAY       Start Date: 06/14/21 End Date: --  Written Date: 06/14/21 Expiration Date: 06/14/22

## 2022-05-06 NOTE — Telephone Encounter (Signed)
Requested medication (s) are due for refill today -yes  Requested medication (s) are on the active medication list -yes  Future visit scheduled -no  Last refill: 01/10/22 #30 3RF  Notes to clinic: non delegated Rx  Requested Prescriptions  Pending Prescriptions Disp Refills   zolpidem (AMBIEN) 10 MG tablet [Pharmacy Med Name: ZOLPIDEM TARTRATE 10 MG TABLET] 30 tablet     Sig: TAKE 1 TABLET BY MOUTH AT BEDTIME AS NEEDED FOR SLEEP     Not Delegated - Psychiatry:  Anxiolytics/Hypnotics Failed - 05/06/2022 12:19 PM      Failed - This refill cannot be delegated      Failed - Urine Drug Screen completed in last 360 days      Passed - Valid encounter within last 6 months    Recent Outpatient Visits           2 months ago Benign essential HTN   [place] Pickard, Cammie Mcgee, MD   1 year ago Benign essential HTN   [place] Dennard Schaumann, Cammie Mcgee, MD   1 year ago Benign essential HTN   [place], Warren T, MD   2 years ago Acute idiopathic gout of right wrist   [place] Pickard, Cammie Mcgee, MD   2 years ago General medical exam   [place] Susy Frizzle, MD              Signed Prescriptions Disp Refills   gabapentin (NEURONTIN) 300 MG capsule 90 capsule 1    Sig: TAKE 1 CAPSULE BY MOUTH THREE TIMES A DAY     Neurology: Anticonvulsants - gabapentin Failed - 05/06/2022 12:19 PM      Failed - Cr in normal range and within 360 days    Creat  Date Value Ref Range Status  02/19/2022 1.10 (H) 0.60 - 1.00 mg/dL Final         Failed - Completed PHQ-2 or PHQ-9 in the last 360 days      Passed - Valid encounter within last 12 months    Recent Outpatient Visits           2 months ago Benign essential HTN   [place] Dennard Schaumann, Cammie Mcgee, MD   1 year ago Benign essential HTN   [place], Cammie Mcgee, MD   1 year ago Benign essential HTN    [place], Warren T, MD   2 years ago Acute idiopathic gout of right wrist   [place] Pickard, Cammie Mcgee, MD   2 years ago General medical exam   [place] Susy Frizzle, MD                 Requested Prescriptions  Pending Prescriptions Disp Refills   zolpidem (AMBIEN) 10 MG tablet [Pharmacy Med Name: ZOLPIDEM TARTRATE 10 MG TABLET] 30 tablet     Sig: TAKE 1 TABLET BY MOUTH AT BEDTIME AS NEEDED FOR SLEEP     Not Delegated - Psychiatry:  Anxiolytics/Hypnotics Failed - 05/06/2022 12:19 PM      Failed - This refill cannot be delegated      Failed - Urine Drug Screen completed in last 360 days      Passed - Valid encounter within last 6 months    Recent Outpatient Visits           2 months ago Benign essential  HTN   [place] Pickard, Cammie Mcgee, MD   1 year ago Benign essential HTN   [place] Dennard Schaumann, Cammie Mcgee, MD   1 year ago Benign essential HTN   [place] Dennard Schaumann, Cammie Mcgee, MD   2 years ago Acute idiopathic gout of right wrist   [place] Dennard Schaumann, Cammie Mcgee, MD   2 years ago General medical exam   [place] Susy Frizzle, MD              Signed Prescriptions Disp Refills   gabapentin (NEURONTIN) 300 MG capsule 90 capsule 1    Sig: TAKE 1 CAPSULE BY MOUTH THREE TIMES A DAY     Neurology: Anticonvulsants - gabapentin Failed - 05/06/2022 12:19 PM      Failed - Cr in normal range and within 360 days    Creat  Date Value Ref Range Status  02/19/2022 1.10 (H) 0.60 - 1.00 mg/dL Final         Failed - Completed PHQ-2 or PHQ-9 in the last 360 days      Passed - Valid encounter within last 12 months    Recent Outpatient Visits           2 months ago Benign essential HTN   [place] Susy Frizzle, MD   1 year ago Benign essential HTN   [place] Dennard Schaumann,  Cammie Mcgee, MD   1 year ago Benign essential HTN   [place] Susy Frizzle, MD   2 years ago Acute idiopathic gout of right wrist   [place] Pickard, Cammie Mcgee, MD   2 years ago General medical exam   [place] Pickard, Cammie Mcgee, MD

## 2022-05-15 ENCOUNTER — Other Ambulatory Visit: Payer: Self-pay | Admitting: Family Medicine

## 2022-05-15 DIAGNOSIS — L299 Pruritus, unspecified: Secondary | ICD-10-CM

## 2022-05-15 NOTE — Telephone Encounter (Signed)
Requested Prescriptions  Pending Prescriptions Disp Refills  . ipratropium (ATROVENT) 0.06 % nasal spray [Pharmacy Med Name: IPRATROPIUM 0.06% SPRAY] 45 mL 0    Sig: SPRAY 2 SPRAYS INTO EACH NOSTRIL 4 TIMES A DAY     Off-Protocol Failed - 05/15/2022  2:07 PM      Failed - Medication not assigned to a protocol, review manually.      Passed - Valid encounter within last 12 months    Recent Outpatient Visits          2 months ago Benign essential HTN   [place] Pickard, Cammie Mcgee, MD   1 year ago Benign essential HTN   [place] Dennard Schaumann, Cammie Mcgee, MD   1 year ago Benign essential HTN   [place] Susy Frizzle, MD   2 years ago Acute idiopathic gout of right wrist   [place] Pickard, Cammie Mcgee, MD   2 years ago General medical exam   [place] Susy Frizzle, MD            Off-Protocol Failed - 05/15/2022  2:07 PM      Failed - Medication not assigned to a protocol, review manually.      Passed - Valid encounter within last 12 months    Recent Outpatient Visits          2 months ago Benign essential HTN   [place] Pickard, Cammie Mcgee, MD   1 year ago Benign essential HTN   [place] Pickard, Cammie Mcgee, MD   1 year ago Benign essential HTN   [place] Susy Frizzle, MD   2 years ago Acute idiopathic gout of right wrist   [place] Dennard Schaumann, Cammie Mcgee, MD   2 years ago General medical exam   [place] Dennard Schaumann, Cammie Mcgee, MD             . mometasone (ELOCON) 0.1 % cream [Pharmacy Med Name: MOMETASONE FUROATE 0.1% CREAM] 45 g 0    Sig: APPLY TO AFFECTED AREA EVERY DAY AS NEEDED     Off-Protocol Failed - 05/15/2022  2:07 PM      Failed - Medication not assigned to a protocol, review manually.      Passed - Valid encounter within last 12 months    Recent Outpatient Visits           2 months ago Benign essential HTN   [place] Pickard, Cammie Mcgee, MD   1 year ago Benign essential HTN   [place] Pickard, Cammie Mcgee, MD   1 year ago Benign essential HTN   [place] Susy Frizzle, MD   2 years ago Acute idiopathic gout of right wrist   [place] Pickard, Cammie Mcgee, MD   2 years ago General medical exam   [place] Pickard, Cammie Mcgee, MD

## 2022-05-20 NOTE — Progress Notes (Signed)
[facility] Note  05/22/2022   CHIEF COMPLAINT Patient presents for Retina Follow Up  HISTORY OF PRESENT ILLNESS: Faith Reeves is a 71 y.o. female who presents to the clinic today for:   HPI     Retina Follow Up   Patient presents with  Wet AMD.  In right eye.  Severity is moderate.  Duration of 6 weeks.  Since onset it is stable.  I, the attending physician,  performed the HPI with the patient and updated documentation appropriately.        Comments   Pt here for 6 wk ret f/u exu ARMD OD. Pt states VA seems the same. Pt reports the loss of her son in the recent weeks, has been crying a lot. On valium currently.       Last edited by Bernarda Caffey, MD on 05/22/2022  5:10 PM.    Pt reports her son passed away on May 05, 2023, she states she is unsure if her vision has improved   Referring physician: Susy Frizzle, MD [address] [place],  [place] [address]  HISTORICAL INFORMATION:   Selected notes from the MEDICAL RECORD NUMBER Referred by Dr. Quentin Ore for concern of exu ARMD OU   CURRENT MEDICATIONS: No current outpatient medications on file. (Ophthalmic Drugs)   No current facility-administered medications for this visit. (Ophthalmic Drugs)   Current Outpatient Medications (Other)  Medication Sig   albuterol (PROVENTIL HFA;VENTOLIN HFA) 108 (90 Base) MCG/ACT inhaler Inhale 2 puffs into the lungs every 4 (four) hours as needed for wheezing or shortness of breath.   ALPRAZolam (XANAX) 0.5 MG tablet Take 1 tablet (0.5 mg total) by mouth 3 (three) times daily as needed for anxiety.   celecoxib (CELEBREX) 200 MG capsule TAKE 1 CAPSULE BY MOUTH EVERY DAY   gabapentin (NEURONTIN) 300 MG capsule TAKE 1 CAPSULE BY MOUTH THREE TIMES A DAY   ipratropium (ATROVENT) 0.06 % nasal spray SPRAY 2 SPRAYS INTO EACH NOSTRIL 4 TIMES A DAY   levocetirizine (XYZAL) 5 MG tablet TAKE 1 TABLET BY MOUTH EVERY DAY IN THE EVENING   methocarbamol (ROBAXIN)  500 MG tablet Take 1 tablet (500 mg total) by mouth 2 (two) times daily as needed for muscle spasms.   mometasone (ELOCON) 0.1 % cream APPLY TO AFFECTED AREA EVERY DAY AS NEEDED   oxyCODONE-acetaminophen (PERCOCET) 10-325 MG tablet Take 1 tablet by mouth every 4 (four) hours as needed for pain (G89.4).   pantoprazole (PROTONIX) 40 MG tablet TAKE 1 TABLET BY MOUTH EVERY DAY   predniSONE (STERAPRED UNI-PAK 21 TAB) 10 MG (21) TBPK tablet Take as directed   sertraline (ZOLOFT) 50 MG tablet TAKE 1 AND 1/2 TABLETS BY MOUTH DAILY   telmisartan-hydrochlorothiazide (MICARDIS HCT) 80-12.5 MG tablet Take 1 tablet by mouth daily.   valACYclovir (VALTREX) 1000 MG tablet Take 2 tablets (2,000 mg total) by mouth 2 (two) times daily.   zolpidem (AMBIEN) 10 MG tablet TAKE 1 TABLET BY MOUTH AT BEDTIME AS NEEDED FOR SLEEP   No current facility-administered medications for this visit. (Other)   REVIEW OF SYSTEMS: ROS   Positive for: Gastrointestinal, Neurological, Eyes Negative for: Constitutional, Skin, Genitourinary, Musculoskeletal, HENT, Endocrine, Cardiovascular, Respiratory, Psychiatric, Allergic/Imm, Heme/Lymph Last edited by Kingsley Spittle, COT on 05/22/2022  1:51 PM.      ALLERGIES Allergies  Allergen Reactions   Cymbalta [Duloxetine Hcl] Swelling   PAST MEDICAL HISTORY Past Medical History:  Diagnosis Date  Allergy    Anemia    Anxiety    Blood transfusion without reported diagnosis    Cataract    GERD (gastroesophageal reflux disease)    Hypertension    Hypertensive retinopathy    OU   Macular degeneration    Past Surgical History:  Procedure Laterality Date   ABDOMINAL HYSTERECTOMY     ACHILLES TENDON REPAIR Bilateral    APPENDECTOMY     BACK SURGERY     BILATERAL CARPAL TUNNEL RELEASE     CHOLECYSTECTOMY     COLONOSCOPY     HERNIA REPAIR     JOINT REPLACEMENT     MELANOMA EXCISION Right 09/15/2017   Procedure: EXCISION LIPOSARCOMA RIGHT ARM;  Surgeon: Georganna Skeans,  MD;  Location: [facility];  Service: General;  Laterality: Right;   SPINE SURGERY     FAMILY HISTORY Family History  Problem Relation Age of Onset   Hypertension Mother    Cancer Sister    Stroke Maternal Grandmother    SOCIAL HISTORY Social History   Tobacco Use   Smoking status: Never   Smokeless tobacco: Never  Vaping Use   Vaping Use: Never used  Substance Use Topics   Alcohol use: Yes    Alcohol/week: 1.0 standard drink of alcohol    Types: 1 Glasses of wine per week   Drug use: No       OPHTHALMIC EXAM: Base Eye Exam     Visual Acuity (Snellen - Linear)       Right Left   Dist cc 20/30 -2 20/20 -2   Dist ph cc NI     Correction: Glasses         Tonometry (Tonopen, 1:55 PM)       Right Left   Pressure 14 17         Pupils       Dark Light Shape React APD   Right 3 2 Round Minimal None   Left 3 2 Round Minimal None         Visual Fields       Left Right    Full Full         Extraocular Movement       Right Left    Full, Ortho Full, Ortho         Neuro/Psych     Oriented x3: Yes   Mood/Affect: Normal         Dilation     Both eyes: 1.0% Mydriacyl, 2.5% Phenylephrine @ 1:56 PM           Slit Lamp and Fundus Exam     Slit Lamp Exam       Right Left   Lids/Lashes Dermatochalasis - upper lid, mild Meibomian gland dysfunction Dermatochalasis - upper lid, mild Meibomian gland dysfunction   Conjunctiva/Sclera White and quiet White and quiet   Cornea 2-3+ diffuse Punctate epithelial erosions, decreased TBUT 2+ diffuse Punctate epithelial erosions, decrease TBUT   Anterior Chamber deep and clear deep and clear   Iris Round and dilated Round and dilated   Lens 2-3+ Nuclear sclerosis, 2-3+ Cortical cataract 2+ Nuclear sclerosis, 2+ Cortical cataract   Anterior Vitreous Vitreous syneresis, Posterior vitreous detachment, prominent vitreous condensation over macula Vitreous syneresis, Posterior vitreous detachment          Fundus Exam       Right Left   Disc Pink and Sharp, Compact Pink and Sharp, temporal PPP, Compact   C/D Ratio 0.2 0.1  Macula Blunted foveal reflex, drusen, RPE mottling, clumping and atrophy, +CNV, interval improvement in central SRF, No heme Flat, good foveal reflex, scattered Drusen, RPE mottling and clumping, focal PEDs, No heme or edema   Vessels attenuated, Tortuous attenuated, mild tortuosity   Periphery Attached, mild reticular degeneration, no heme Attached, no heme           Refraction     Wearing Rx       Sphere Cylinder Axis Add   Right +2.00 +0.50 008 +2.50   Left +2.50 +1.00 154 +2.50    Type: PAL            IMAGING AND PROCEDURES  Imaging and Procedures for @TODAY @  OCT, Retina - OU - Both Eyes       Right Eye Quality was good. Central Foveal Thickness: 369. Progression has improved. Findings include normal foveal contour, no IRF, no SRF, retinal drusen , pigment epithelial detachment, outer retinal atrophy (Interval improvement in central SRF overlying PEDs).   Left Eye Quality was good. Central Foveal Thickness: 282. Progression has been stable. Findings include normal foveal contour, no IRF, no SRF, retinal drusen .   Notes *Images captured and stored on drive  Diagnosis / Impression:  OD: exu ARMD -- Interval improvement in central SRF overlying PEDs OS: non-exu ARMD -- stable   Clinical management:  See below  Abbreviations: NFP - Normal foveal profile. CME - cystoid macular edema. PED - pigment epithelial detachment. IRF - intraretinal fluid. SRF - subretinal fluid. EZ - ellipsoid zone. ERM - epiretinal membrane. ORA - outer retinal atrophy. ORT - outer retinal tubulation. SRHM - subretinal hyper-reflective material      Intravitreal Injection, Pharmacologic Agent - OD - Right Eye       Time Out 05/22/2022. 2:58 PM. Confirmed correct patient, procedure, site, and patient consented.   Anesthesia Topical anesthesia was used.  Anesthetic medications included Lidocaine 2%, Proparacaine 0.5%.   Procedure Preparation included 5% betadine to ocular surface, eyelid speculum. A (32g) needle was used.   Injection: 2 mg aflibercept 2 MG/0.05ML   Route: Intravitreal, Site: Right Eye   NDC: [id number], Lot: [id number], Expiration date: 03/04/2023, Waste: 0 mL   Post-op Post injection exam found visual acuity of at least counting fingers. The patient tolerated the procedure well. There were no complications. The patient received written and verbal post procedure care education. Post injection medications were not given.             ASSESSMENT/PLAN:    ICD-10-CM   1. Exudative age-related macular degeneration of right eye with active choroidal neovascularization (HCC)  H35.3211 OCT, Retina - OU - Both Eyes    Intravitreal Injection, Pharmacologic Agent - OD - Right Eye    aflibercept (EYLEA) SOLN 2 mg    2. Intermediate stage nonexudative age-related macular degeneration of left eye  H35.3122     3. Essential hypertension  I10     4. Hypertensive retinopathy of both eyes  H35.033     5. Combined forms of age-related cataract of both eyes  H25.813     6. Glaucoma suspect of both eyes  H40.003      1. Exudative age related macular degeneration, right eye  - FA (03.10.20) shows +CNV w/ staining/leakage  - repeat FA 09.13.21 shows interval increase in perifoveal leakage OD  - s/p IVA OD #1 (03.10.20), #2 (04.13.20), #3 (05.11.20), #4 (06.23.20), #5 (8.18.20), #6 (10.27.20), #7 (01.26.21), #8 (04.26.21), #9 (08.02.21), #10 (09.13.21), #11 (10.11.21), #12 (  11.9.21), #13 (12.14.21), #14 (1.24.22), #15 (3.15.22), #16 (05.24.22), #17 (8.9.22), #18 (10.18.22), #19 (01.03.23), #20 (03.29.23) -- IVA resistance  - s/p IVE OD #1 (06.07.23)  - pt was doing very well with q3 mo maintenance injections, but then developed interval increases in central SRF over several visits -- IVA resistance  - OCT today shows Interval  improvement in central SRF overlying PEDs at 6 weeks   - recommend IVE OD #2 today, 07.19.23, with follow up in 6 wks  - pt wishes to be treated with IVE OD  - RBA of procedure discussed, questions answered  - IVE informed consent obtained and signed, 06.07.23 (OD)  - see procedure note  - f/u in 6 wks -- DFE/OCT/possible injection  2. Age related macular degeneration, non-exudative, left eye  - The incidence, anatomy, and pathology of dry AMD, risk of progression, and the AREDS and AREDS 2 study including smoking risks discussed with patient.  - intermediate stage  - recommend amsler grid monitoring  3,4. Hypertensive retinopathy OU  - discussed importance of tight BP control  - monitor  5. Mixed form age related cataracts OU  - The symptoms of cataract, surgical options, and treatments and risks were discussed with patient  - under the expert management of Dr. Herbert Deaner  - pt planning on cataract surgery w/ [facility] ~Nov 2023  6. Glaucoma Suspect OU  - IOP 14,17  - under the expert management of Dr. Herbert Deaner  Ophthalmic Meds Ordered this visit:  Meds ordered this encounter  Medications   aflibercept (EYLEA) SOLN 2 mg   Return in about 6 weeks (around 07/03/2022) for ex ARMD OD, Dilated Exam, OCT, Possible Injxn.  There are no Patient Instructions on file for this visit.  This document serves as a record of services personally performed by Gardiner Sleeper, MD, PhD. It was created on their behalf by Orvan Falconer, an ophthalmic technician. The creation of this record is the provider's dictation and/or activities during the visit.    Electronically signed by: Orvan Falconer, OA, 05/22/22  5:11 PM  This document serves as a record of services personally performed by Gardiner Sleeper, MD, PhD. It was created on their behalf by San Jetty. Owens Shark, OA an ophthalmic technician. The creation of this record is the provider's dictation and/or activities during the visit.    Electronically  signed by: San Jetty. Owens Shark, [place] 07.19.2023 5:11 PM  Gardiner Sleeper, M.D., Ph.D. Diseases & Surgery of the Retina and Vitreous [facility]  I have reviewed the above documentation for accuracy and completeness, and I agree with the above. Gardiner Sleeper, M.D., Ph.D. 05/22/22 5:12 PM  Abbreviations: M myopia (nearsighted); A astigmatism; H hyperopia (farsighted); P presbyopia; Mrx spectacle prescription;  CTL contact lenses; OD right eye; OS left eye; OU both eyes  XT exotropia; ET esotropia; PEK punctate epithelial keratitis; PEE punctate epithelial erosions; DES dry eye syndrome; MGD meibomian gland dysfunction; ATs artificial tears; PFAT's preservative free artificial tears; [place] nuclear sclerotic cataract; PSC posterior subcapsular cataract; ERM epi-retinal membrane; PVD posterior vitreous detachment; RD retinal detachment; DM diabetes mellitus; DR diabetic retinopathy; NPDR non-proliferative diabetic retinopathy; PDR proliferative diabetic retinopathy; CSME clinically significant macular edema; DME diabetic macular edema; dbh dot blot hemorrhages; CWS cotton wool spot; POAG primary open angle glaucoma; C/D cup-to-disc ratio; HVF humphrey visual field; GVF goldmann visual field; OCT optical coherence tomography; IOP intraocular pressure; BRVO Branch retinal vein occlusion; CRVO central retinal vein occlusion; CRAO central retinal  artery occlusion; BRAO branch retinal artery occlusion; RT retinal tear; SB scleral buckle; PPV pars plana vitrectomy; VH Vitreous hemorrhage; PRP panretinal laser photocoagulation; IVK intravitreal kenalog; VMT vitreomacular traction; MH Macular hole;  NVD neovascularization of the disc; NVE neovascularization elsewhere; AREDS age related eye disease study; ARMD age related macular degeneration; POAG primary open angle glaucoma; EBMD epithelial/anterior basement membrane dystrophy; ACIOL anterior chamber intraocular lens; IOL intraocular lens; PCIOL  posterior chamber intraocular lens; Phaco/IOL phacoemulsification with intraocular lens placement; [place] photorefractive keratectomy; LASIK laser assisted in situ keratomileusis; HTN hypertension; DM diabetes mellitus; COPD chronic obstructive pulmonary disease

## 2022-05-22 ENCOUNTER — Ambulatory Visit (INDEPENDENT_AMBULATORY_CARE_PROVIDER_SITE_OTHER): Payer: Medicare Other | Admitting: Ophthalmology

## 2022-05-22 ENCOUNTER — Encounter (INDEPENDENT_AMBULATORY_CARE_PROVIDER_SITE_OTHER): Payer: Self-pay | Admitting: Ophthalmology

## 2022-05-22 DIAGNOSIS — H353211 Exudative age-related macular degeneration, right eye, with active choroidal neovascularization: Secondary | ICD-10-CM | POA: Diagnosis not present

## 2022-05-22 DIAGNOSIS — H35033 Hypertensive retinopathy, bilateral: Secondary | ICD-10-CM | POA: Diagnosis not present

## 2022-05-22 DIAGNOSIS — H40003 Preglaucoma, unspecified, bilateral: Secondary | ICD-10-CM

## 2022-05-22 DIAGNOSIS — H353122 Nonexudative age-related macular degeneration, left eye, intermediate dry stage: Secondary | ICD-10-CM | POA: Diagnosis not present

## 2022-05-22 DIAGNOSIS — H25813 Combined forms of age-related cataract, bilateral: Secondary | ICD-10-CM | POA: Diagnosis not present

## 2022-05-22 DIAGNOSIS — I10 Essential (primary) hypertension: Secondary | ICD-10-CM | POA: Diagnosis not present

## 2022-05-22 MED ORDER — AFLIBERCEPT 2MG/0.05ML IZ SOLN FOR KALEIDOSCOPE
2.0000 mg | INTRAVITREAL | Status: AC | PRN
  Administered 2022-05-22: 2 mg via INTRAVITREAL

## 2022-05-23 ENCOUNTER — Other Ambulatory Visit: Payer: Self-pay | Admitting: Family Medicine

## 2022-05-23 ENCOUNTER — Telehealth: Payer: Self-pay

## 2022-05-23 MED ORDER — OXYCODONE-ACETAMINOPHEN 10-325 MG PO TABS: 1.0000 | ORAL_TABLET | ORAL | 0 refills | Status: DC | PRN

## 2022-05-23 NOTE — Telephone Encounter (Signed)
Pt requesting a refill of oxyCODONE-acetaminophen (PERCOCET) 10-325 MG tablet   LOV: 02/19/22

## 2022-06-07 ENCOUNTER — Other Ambulatory Visit: Payer: Self-pay

## 2022-06-07 DIAGNOSIS — L299 Pruritus, unspecified: Secondary | ICD-10-CM

## 2022-06-07 MED ORDER — MOMETASONE FUROATE 0.1 % EX CREA: TOPICAL_CREAM | CUTANEOUS | 0 refills | Status: DC

## 2022-06-07 NOTE — Telephone Encounter (Signed)
Pharmacy faxed a refill request for mometasone (ELOCON) 0.1 % cream [[id number]]    Order Details Dose, Route, Frequency: As Directed  Dispense Quantity: 45 g Refills: 0        Sig: APPLY TO AFFECTED AREA EVERY DAY AS NEEDED       Start Date: 05/15/22 End Date: --  Written Date: 05/15/22 Expiration Date: 05/15/23

## 2022-06-07 NOTE — Telephone Encounter (Signed)
Requested medications are due for refill today.  Unsure  Requested medications are on the active medications list.  yes  Last refill. 05/15/2022 45g 0 refills  Future visit scheduled.   no  Notes to clinic.  No protocol assigned  - Provider to review    Requested Prescriptions  Pending Prescriptions Disp Refills   mometasone (ELOCON) 0.1 % cream 45 g 0     Off-Protocol Failed - 06/07/2022 10:01 AM      Failed - Medication not assigned to a protocol, review manually.      Passed - Valid encounter within last 12 months    Recent Outpatient Visits           3 months ago Benign essential HTN   [place] Pickard, Cammie Mcgee, MD   1 year ago Benign essential HTN   [place] Pickard, Cammie Mcgee, MD   1 year ago Benign essential HTN   [place] Susy Frizzle, MD   2 years ago Acute idiopathic gout of right wrist   [place] Pickard, Cammie Mcgee, MD   2 years ago General medical exam   [place] Pickard, Cammie Mcgee, MD

## 2022-07-03 ENCOUNTER — Encounter (INDEPENDENT_AMBULATORY_CARE_PROVIDER_SITE_OTHER): Payer: Medicare Other | Admitting: Ophthalmology

## 2022-07-03 DIAGNOSIS — H353211 Exudative age-related macular degeneration, right eye, with active choroidal neovascularization: Secondary | ICD-10-CM

## 2022-07-03 DIAGNOSIS — H35033 Hypertensive retinopathy, bilateral: Secondary | ICD-10-CM

## 2022-07-03 DIAGNOSIS — H40003 Preglaucoma, unspecified, bilateral: Secondary | ICD-10-CM

## 2022-07-03 DIAGNOSIS — H353122 Nonexudative age-related macular degeneration, left eye, intermediate dry stage: Secondary | ICD-10-CM

## 2022-07-03 DIAGNOSIS — H25813 Combined forms of age-related cataract, bilateral: Secondary | ICD-10-CM

## 2022-07-03 DIAGNOSIS — I10 Essential (primary) hypertension: Secondary | ICD-10-CM

## 2022-07-04 ENCOUNTER — Other Ambulatory Visit: Payer: Self-pay | Admitting: Family Medicine

## 2022-07-04 NOTE — Telephone Encounter (Signed)
Received efax from pharmacy to request refill of  telmisartan-hydrochlorothiazide (MICARDIS HCT) 80-12.5 MG tablet   Fax received from:  [id number] [place] [place] [address] [id number] Phone:  [phone] Fax:  [phone] DEA #:  [id number] [place] 03/06/2022  Please advise pharmacist at [phone]

## 2022-07-05 ENCOUNTER — Ambulatory Visit (INDEPENDENT_AMBULATORY_CARE_PROVIDER_SITE_OTHER): Payer: Medicare Other | Admitting: Ophthalmology

## 2022-07-05 ENCOUNTER — Encounter (INDEPENDENT_AMBULATORY_CARE_PROVIDER_SITE_OTHER): Payer: Self-pay | Admitting: Ophthalmology

## 2022-07-05 DIAGNOSIS — I10 Essential (primary) hypertension: Secondary | ICD-10-CM | POA: Diagnosis not present

## 2022-07-05 DIAGNOSIS — H25813 Combined forms of age-related cataract, bilateral: Secondary | ICD-10-CM

## 2022-07-05 DIAGNOSIS — H04123 Dry eye syndrome of bilateral lacrimal glands: Secondary | ICD-10-CM | POA: Diagnosis not present

## 2022-07-05 DIAGNOSIS — H40003 Preglaucoma, unspecified, bilateral: Secondary | ICD-10-CM | POA: Diagnosis not present

## 2022-07-05 DIAGNOSIS — H35033 Hypertensive retinopathy, bilateral: Secondary | ICD-10-CM | POA: Diagnosis not present

## 2022-07-05 DIAGNOSIS — H353122 Nonexudative age-related macular degeneration, left eye, intermediate dry stage: Secondary | ICD-10-CM

## 2022-07-05 DIAGNOSIS — H353211 Exudative age-related macular degeneration, right eye, with active choroidal neovascularization: Secondary | ICD-10-CM

## 2022-07-05 MED ORDER — AFLIBERCEPT 2MG/0.05ML IZ SOLN FOR KALEIDOSCOPE
2.0000 mg | INTRAVITREAL | Status: AC | PRN
  Administered 2022-07-05: 2 mg via INTRAVITREAL

## 2022-07-05 NOTE — Progress Notes (Signed)
[facility] Note  07/05/2022   CHIEF COMPLAINT Patient presents for Retina Follow Up  HISTORY OF PRESENT ILLNESS: Faith Reeves is a 71 y.o. female who presents to the clinic today for:   HPI     Retina Follow Up   Patient presents with  Wet AMD.  In right eye.  This started months ago.  Severity is moderate.  Duration of 6 weeks.  Since onset it is stable.  I, the attending physician,  performed the HPI with the patient and updated documentation appropriately.        Comments   Patient feels that the vision is blurry.       Last edited by Bernarda Caffey, MD on 07/05/2022  3:31 PM.    Pt states left eye vision is blurry. Pt states she stopped using all lubricating drops after hearing about recalls on artificial tears.  Referring physician: Susy Frizzle, MD [address] [facility] [place],  [place] [address]  HISTORICAL INFORMATION:   Selected notes from the MEDICAL RECORD NUMBER Referred by Dr. Quentin Ore for concern of exu ARMD OU   CURRENT MEDICATIONS: No current outpatient medications on file. (Ophthalmic Drugs)   No current facility-administered medications for this visit. (Ophthalmic Drugs)   Current Outpatient Medications (Other)  Medication Sig   albuterol (PROVENTIL HFA;VENTOLIN HFA) 108 (90 Base) MCG/ACT inhaler Inhale 2 puffs into the lungs every 4 (four) hours as needed for wheezing or shortness of breath.   ALPRAZolam (XANAX) 0.5 MG tablet Take 1 tablet (0.5 mg total) by mouth 3 (three) times daily as needed for anxiety.   celecoxib (CELEBREX) 200 MG capsule TAKE 1 CAPSULE BY MOUTH EVERY DAY   gabapentin (NEURONTIN) 300 MG capsule TAKE 1 CAPSULE BY MOUTH THREE TIMES A DAY   ipratropium (ATROVENT) 0.06 % nasal spray SPRAY 2 SPRAYS INTO EACH NOSTRIL 4 TIMES A DAY   levocetirizine (XYZAL) 5 MG tablet TAKE 1 TABLET BY MOUTH EVERY DAY IN THE EVENING   methocarbamol (ROBAXIN) 500 MG tablet Take 1 tablet (500 mg total) by mouth 2 (two)  times daily as needed for muscle spasms.   mometasone (ELOCON) 0.1 % cream APPLY TO AFFECTED AREA EVERY DAY AS NEEDED   oxyCODONE-acetaminophen (PERCOCET) 10-325 MG tablet Take 1 tablet by mouth every 4 (four) hours as needed for pain (G89.4).   pantoprazole (PROTONIX) 40 MG tablet TAKE 1 TABLET BY MOUTH EVERY DAY   predniSONE (STERAPRED UNI-PAK 21 TAB) 10 MG (21) TBPK tablet Take as directed   sertraline (ZOLOFT) 50 MG tablet TAKE 1 AND 1/2 TABLETS BY MOUTH DAILY   telmisartan-hydrochlorothiazide (MICARDIS HCT) 80-12.5 MG tablet Take 1 tablet by mouth daily.   valACYclovir (VALTREX) 1000 MG tablet Take 2 tablets (2,000 mg total) by mouth 2 (two) times daily.   zolpidem (AMBIEN) 10 MG tablet TAKE 1 TABLET BY MOUTH AT BEDTIME AS NEEDED FOR SLEEP   No current facility-administered medications for this visit. (Other)   REVIEW OF SYSTEMS: ROS   Positive for: Gastrointestinal, Neurological, Eyes Negative for: Constitutional, Skin, Genitourinary, Musculoskeletal, HENT, Endocrine, Cardiovascular, Respiratory, Psychiatric, Allergic/Imm, Heme/Lymph Last edited by Annie Paras, COT on 07/05/2022  1:25 PM.     ALLERGIES Allergies  Allergen Reactions   Cymbalta [Duloxetine Hcl] Swelling   PAST MEDICAL HISTORY Past Medical History:  Diagnosis Date   Allergy    Anemia    Anxiety    Blood transfusion without reported diagnosis    Cataract  GERD (gastroesophageal reflux disease)    Hypertension    Hypertensive retinopathy    OU   Macular degeneration    Past Surgical History:  Procedure Laterality Date   ABDOMINAL HYSTERECTOMY     ACHILLES TENDON REPAIR Bilateral    APPENDECTOMY     BACK SURGERY     BILATERAL CARPAL TUNNEL RELEASE     CHOLECYSTECTOMY     COLONOSCOPY     HERNIA REPAIR     JOINT REPLACEMENT     MELANOMA EXCISION Right 09/15/2017   Procedure: EXCISION LIPOSARCOMA RIGHT ARM;  Surgeon: Georganna Skeans, MD;  Location: [place];  Service: General;  Laterality: Right;    SPINE SURGERY     FAMILY HISTORY Family History  Problem Relation Age of Onset   Hypertension Mother    Cancer Sister    Stroke Maternal Grandmother    SOCIAL HISTORY Social History   Tobacco Use   Smoking status: Never   Smokeless tobacco: Never  Vaping Use   Vaping Use: Never used  Substance Use Topics   Alcohol use: Yes    Alcohol/week: 1.0 standard drink of alcohol    Types: 1 Glasses of wine per week   Drug use: No       OPHTHALMIC EXAM: Base Eye Exam     Visual Acuity (Snellen - Linear)       Right Left   Dist cc 20/50 +2 20/20   Dist ph cc NI     Correction: Glasses         Tonometry (Tonopen, 1:29 PM)       Right Left   Pressure 15 16         Pupils       Dark Light Shape React APD   Right 3 2 Round Brisk None   Left 3 2 Round Brisk None         Visual Fields       Left Right    Full Full         Extraocular Movement       Right Left    Full, Ortho Full, Ortho         Neuro/Psych     Oriented x3: Yes   Mood/Affect: Normal         Dilation     Both eyes: 1.0% Mydriacyl, 2.5% Phenylephrine @ 1:25 PM           Slit Lamp and Fundus Exam     Slit Lamp Exam       Right Left   Lids/Lashes Dermatochalasis - upper lid, mild Meibomian gland dysfunction Dermatochalasis - upper lid, mild Meibomian gland dysfunction   Conjunctiva/Sclera White and quiet White and quiet   Cornea 2-3+ fine Punctate epithelial erosions 2+ fine Punctate epithelial erosions inferiorly   Anterior Chamber deep, clear, narrow temporal angle deep, clear, narrow temporal angle   Iris Round and dilated Round and dilated   Lens 2-3+ Nuclear sclerosis, 2-3+ Cortical cataract 2-3+ Nuclear sclerosis, 2-3+ Cortical cataract   Anterior Vitreous Vitreous syneresis, Posterior vitreous detachment, prominent vitreous condensation over macula Vitreous syneresis, Posterior vitreous detachment         Fundus Exam       Right Left   Disc Pink and Sharp,  Compact, +PPA Pink and Sharp, temporal PPP, Compact   C/D Ratio 0.2 0.1   Macula Blunted foveal reflex, drusen, RPE mottling, clumping and atrophy, +CNV, interval improvement in central SRF, No heme Flat, good foveal reflex,  scattered Drusen, RPE mottling and clumping, focal PEDs, No heme or edema   Vessels attenuated, Tortuous attenuated, mild tortuosity   Periphery Attached, mild reticular degeneration, no heme Attached, no heme           Refraction     Wearing Rx       Sphere Cylinder Axis Add   Right +2.00 +0.50 008 +2.50   Left +2.50 +1.00 154 +2.50    Type: PAL            IMAGING AND PROCEDURES  Imaging and Procedures for '@TODAY'$ @  OCT, Retina - OU - Both Eyes       Right Eye Quality was good. Central Foveal Thickness: 291. Progression has improved. Findings include normal foveal contour, no IRF, no SRF, retinal drusen , pigment epithelial detachment, outer retinal atrophy (Interval improvement in central SRF overlying low, stable PEDs).   Left Eye Quality was good. Central Foveal Thickness: 267. Progression has been stable. Findings include normal foveal contour, no IRF, no SRF, retinal drusen .   Notes *Images captured and stored on drive  Diagnosis / Impression:  OD: exu ARMD -- Interval improvement in central SRF overlying low, stable PEDs OS: non-exu ARMD -- stable   Clinical management:  See below  Abbreviations: NFP - Normal foveal profile. CME - cystoid macular edema. PED - pigment epithelial detachment. IRF - intraretinal fluid. SRF - subretinal fluid. EZ - ellipsoid zone. ERM - epiretinal membrane. ORA - outer retinal atrophy. ORT - outer retinal tubulation. SRHM - subretinal hyper-reflective material      Intravitreal Injection, Pharmacologic Agent - OD - Right Eye       Time Out 07/05/2022. 2:12 PM. Confirmed correct patient, procedure, site, and patient consented.   Anesthesia Topical anesthesia was used. Anesthetic medications included  Lidocaine 2%, Proparacaine 0.5%.   Procedure Preparation included 5% betadine to ocular surface, eyelid speculum. A (32g) needle was used.   Injection: 2 mg aflibercept 2 MG/0.05ML   Route: Intravitreal, Site: Right Eye   NDC: [id number], Lot: [id number], Expiration date: 09/04/2023, Waste: 0 mL   Post-op Post injection exam found visual acuity of at least counting fingers. The patient tolerated the procedure well. There were no complications. The patient received written and verbal post procedure care education. Post injection medications were not given.            ASSESSMENT/PLAN:    ICD-10-CM   1. Exudative age-related macular degeneration of right eye with active choroidal neovascularization (HCC)  H35.3211 OCT, Retina - OU - Both Eyes    Intravitreal Injection, Pharmacologic Agent - OD - Right Eye    aflibercept (EYLEA) SOLN 2 mg    2. Intermediate stage nonexudative age-related macular degeneration of left eye  H35.3122     3. Essential hypertension  I10     4. Hypertensive retinopathy of both eyes  H35.033     5. Combined forms of age-related cataract of both eyes  H25.813     6. Glaucoma suspect of both eyes  H40.003     7. Dry eyes  H04.123      1. Exudative age related macular degeneration, right eye  - FA (03.10.20) shows +CNV w/ staining/leakage  - repeat FA 09.13.21 shows interval increase in perifoveal leakage OD  - s/p IVA OD #1 (03.10.20), #2 (04.13.20), #3 (05.11.20), #4 (06.23.20), #5 (8.18.20), #6 (10.27.20), #7 (01.26.21), #8 (04.26.21), #9 (08.02.21), #10 (09.13.21), #11 (10.11.21), #12 (11.9.21), #13 (12.14.21), #14 (1.24.22), #15 (3.15.22), #16 (05.24.22), #17 (  8.9.22), #18 (10.18.22), #19 (01.03.23), #20 (03.29.23) -- IVA resistance  - s/p IVE OD #1 (06.07.23), #2 (07.19.23)  - pt was doing very well with q3 mo maintenance injections, but then developed interval increases in central SRF over several visits -- IVA resistance  - OCT today shows  Interval improvement in central SRF overlying low, stable PEDs at 6 weeks   - recommend IVE OD #3 today, 09.01.23, with follow up in 6 wks  - pt wishes to be treated with IVE OD  - RBA of procedure discussed, questions answered  - IVE informed consent obtained and signed, 06.07.23 (OD)  - see procedure note  - f/u in 6 wks -- DFE/OCT/possible injection  2. Age related macular degeneration, non-exudative, left eye  - The incidence, anatomy, and pathology of dry AMD, risk of progression, and the AREDS and AREDS 2 study including smoking risks discussed with patient.  - intermediate stage  - recommend amsler grid monitoring  3,4. Hypertensive retinopathy OU  - discussed importance of tight BP control  - monitor  5. Mixed form age related cataracts OU  - The symptoms of cataract, surgical options, and treatments and risks were discussed with patient  - under the expert management of Dr. Herbert Deaner  - pt planning on cataract surgery w/ Hecker ~Nov 2023  6. Glaucoma Suspect OU  - IOP 15,16  - under the expert management of Dr. Herbert Deaner  7. Dry eyes OU  - decreased vision OD today mostly due to increased PEE / dry eyes  - pt reports stopping ATs after hearing about product recalls on ATs - recommend artificial tears and lubricating ointment as needed   Ophthalmic Meds Ordered this visit:  Meds ordered this encounter  Medications   aflibercept (EYLEA) SOLN 2 mg   Return in about 6 weeks (around 08/16/2022) for f/u exu ARMD OD, DFE, OCT.  There are no Patient Instructions on file for this visit.  This document serves as a record of services personally performed by Gardiner Sleeper, MD, PhD. It was created on their behalf by Orvan Falconer, an ophthalmic technician. The creation of this record is the provider's dictation and/or activities during the visit.    Electronically signed by: Orvan Falconer, OA, 07/05/22  3:34 PM  This document serves as a record of services personally  performed by Gardiner Sleeper, MD, PhD. It was created on their behalf by San Jetty. Owens Shark, OA an ophthalmic technician. The creation of this record is the provider's dictation and/or activities during the visit.    Electronically signed by: San Jetty. Owens Shark, [place] 09.01.2023 3:34 PM  Gardiner Sleeper, M.D., Ph.D. Diseases & Surgery of the Retina and Vitreous [facility]  I have reviewed the above documentation for accuracy and completeness, and I agree with the above. Gardiner Sleeper, M.D., Ph.D. 07/05/22 3:34 PM   Abbreviations: M myopia (nearsighted); A astigmatism; H hyperopia (farsighted); P presbyopia; Mrx spectacle prescription;  CTL contact lenses; OD right eye; OS left eye; OU both eyes  XT exotropia; ET esotropia; PEK punctate epithelial keratitis; PEE punctate epithelial erosions; DES dry eye syndrome; MGD meibomian gland dysfunction; ATs artificial tears; PFAT's preservative free artificial tears; [place] nuclear sclerotic cataract; PSC posterior subcapsular cataract; ERM epi-retinal membrane; PVD posterior vitreous detachment; RD retinal detachment; DM diabetes mellitus; DR diabetic retinopathy; NPDR non-proliferative diabetic retinopathy; PDR proliferative diabetic retinopathy; CSME clinically significant macular edema; DME diabetic macular edema; dbh dot blot hemorrhages; CWS cotton wool spot; POAG  primary open angle glaucoma; C/D cup-to-disc ratio; HVF humphrey visual field; GVF goldmann visual field; OCT optical coherence tomography; IOP intraocular pressure; BRVO Branch retinal vein occlusion; CRVO central retinal vein occlusion; CRAO central retinal artery occlusion; BRAO branch retinal artery occlusion; RT retinal tear; SB scleral buckle; PPV pars plana vitrectomy; VH Vitreous hemorrhage; PRP panretinal laser photocoagulation; IVK intravitreal kenalog; VMT vitreomacular traction; MH Macular hole;  NVD neovascularization of the disc; NVE neovascularization elsewhere; AREDS  age related eye disease study; ARMD age related macular degeneration; POAG primary open angle glaucoma; EBMD epithelial/anterior basement membrane dystrophy; ACIOL anterior chamber intraocular lens; IOL intraocular lens; PCIOL posterior chamber intraocular lens; Phaco/IOL phacoemulsification with intraocular lens placement; [place] photorefractive keratectomy; LASIK laser assisted in situ keratomileusis; HTN hypertension; DM diabetes mellitus; COPD chronic obstructive pulmonary disease

## 2022-07-05 NOTE — Telephone Encounter (Signed)
reordered 05/06/22 #90  Requested Prescriptions  Refused Prescriptions Disp Refills  . telmisartan-hydrochlorothiazide (MICARDIS HCT) 80-12.5 MG tablet 90 tablet 0    Sig: Take 1 tablet by mouth daily.     Cardiovascular: ARB + Diuretic Combos Failed - 07/04/2022  4:15 PM      Failed - Cr in normal range and within 180 days    Creat  Date Value Ref Range Status  02/19/2022 1.10 (H) 0.60 - 1.00 mg/dL Final         Passed - K in normal range and within 180 days    Potassium  Date Value Ref Range Status  02/19/2022 4.0 3.5 - 5.3 mmol/L Final         Passed - Na in normal range and within 180 days    Sodium  Date Value Ref Range Status  02/19/2022 143 135 - 146 mmol/L Final         Passed - eGFR is 10 or above and within 180 days    GFR, Est African American  Date Value Ref Range Status  03/12/2021 55 (L) > OR = 60 mL/min/1.41m Final   GFR, Est Non African American  Date Value Ref Range Status  03/12/2021 47 (L) > OR = 60 mL/min/1.757mFinal   eGFR  Date Value Ref Range Status  02/19/2022 53 (L) > OR = 60 mL/min/1.7367minal    Comment:    The eGFR is based on the CKD-EPI 2021 equation. To calculate  the new eGFR from a previous Creatinine or Cystatin C result, go to https://www.kidney.org/professionals/ kdoqi/gfr%5Fcalculator          Passed - Patient is not pregnant      Passed - Last BP in normal range    BP Readings from Last 1 Encounters:  03/06/22 120/80         Passed - Valid encounter within last 6 months    Recent Outpatient Visits          4 months ago Benign essential HTN   [facility]   1 year ago Benign essential HTN   [facility]   1 year ago Benign essential HTN   [facility]   2 years ago Acute idiopathic gout of right wrist   [facility]   2 years ago General medical exam   [facility] [place] [facility]

## 2022-07-10 ENCOUNTER — Telehealth: Payer: Self-pay | Admitting: Family Medicine

## 2022-07-10 NOTE — Telephone Encounter (Signed)
Received e-fax from pharmacy to request refill of  telmisartan-hydrochlorothiazide (MICARDIS HCT) 80-12.5 MG tablet [[id number]]   Pharmacy:  [id number] [address] [place] Phone:  [phone] Fax:  [phone] DEA #:  [id number] LOV: 02/19/2022  Please advise pharmacist at [phone]

## 2022-07-11 DIAGNOSIS — H25813 Combined forms of age-related cataract, bilateral: Secondary | ICD-10-CM | POA: Diagnosis not present

## 2022-07-11 DIAGNOSIS — H25811 Combined forms of age-related cataract, right eye: Secondary | ICD-10-CM | POA: Diagnosis not present

## 2022-07-11 DIAGNOSIS — H40013 Open angle with borderline findings, low risk, bilateral: Secondary | ICD-10-CM | POA: Diagnosis not present

## 2022-07-11 NOTE — Telephone Encounter (Signed)
Refilled 05/06/22 # 90. Requested Prescriptions  Refused Prescriptions Disp Refills  . telmisartan-hydrochlorothiazide (MICARDIS HCT) 80-12.5 MG tablet 90 tablet 0    Sig: Take 1 tablet by mouth daily.     Cardiovascular: ARB + Diuretic Combos Failed - 07/10/2022 12:24 PM      Failed - Cr in normal range and within 180 days    Creat  Date Value Ref Range Status  02/19/2022 1.10 (H) 0.60 - 1.00 mg/dL Final         Passed - K in normal range and within 180 days    Potassium  Date Value Ref Range Status  02/19/2022 4.0 3.5 - 5.3 mmol/L Final         Passed - Na in normal range and within 180 days    Sodium  Date Value Ref Range Status  02/19/2022 143 135 - 146 mmol/L Final         Passed - eGFR is 10 or above and within 180 days    GFR, Est African American  Date Value Ref Range Status  03/12/2021 55 (L) > OR = 60 mL/min/1.53m Final   GFR, Est Non African American  Date Value Ref Range Status  03/12/2021 47 (L) > OR = 60 mL/min/1.774mFinal   eGFR  Date Value Ref Range Status  02/19/2022 53 (L) > OR = 60 mL/min/1.7362minal    Comment:    The eGFR is based on the CKD-EPI 2021 equation. To calculate  the new eGFR from a previous Creatinine or Cystatin C result, go to https://www.kidney.org/professionals/ kdoqi/gfr%5Fcalculator          Passed - Patient is not pregnant      Passed - Last BP in normal range    BP Readings from Last 1 Encounters:  03/06/22 120/80         Passed - Valid encounter within last 6 months    Recent Outpatient Visits          4 months ago Benign essential HTN   [facility]   1 year ago Benign essential HTN   [facility]   1 year ago Benign essential HTN   [facility]   2 years ago Acute idiopathic gout of right wrist   [facility]   2 years ago General medical exam   [facility] [place] [facility]

## 2022-07-15 ENCOUNTER — Other Ambulatory Visit: Payer: Self-pay

## 2022-07-15 DIAGNOSIS — M4120 Other idiopathic scoliosis, site unspecified: Secondary | ICD-10-CM

## 2022-07-15 MED ORDER — GABAPENTIN 300 MG PO CAPS: ORAL_CAPSULE | ORAL | 1 refills | Status: DC

## 2022-08-02 NOTE — Progress Notes (Shared)
[facility] Note  08/16/2022   CHIEF COMPLAINT Patient presents for Retina Follow Up  HISTORY OF PRESENT ILLNESS: Bexlee Bergdoll is a 71 y.o. female who presents to the clinic today for:   HPI     Retina Follow Up   Patient presents with  Wet AMD.  In right eye.  This started 6 weeks ago.  I, the attending physician,  performed the HPI with the patient and updated documentation appropriately.        Comments   Patient here for 6 weeks retina follow up for exu ARMD OD. Patient states vision doing ok. No eye pain.       Last edited by Bernarda Caffey, MD on 08/16/2022  2:35 PM.    Pt states vision is stable, she is having cataract surgery OD on November 15  Referring physician: Lisabeth Pick, MD [address] [place],   [id number]  HISTORICAL INFORMATION:   Selected notes from the MEDICAL RECORD NUMBER Referred by Dr. Quentin Ore for concern of exu ARMD OU   CURRENT MEDICATIONS: No current outpatient medications on file. (Ophthalmic Drugs)   No current facility-administered medications for this visit. (Ophthalmic Drugs)   Current Outpatient Medications (Other)  Medication Sig   albuterol (PROVENTIL HFA;VENTOLIN HFA) 108 (90 Base) MCG/ACT inhaler Inhale 2 puffs into the lungs every 4 (four) hours as needed for wheezing or shortness of breath.   ALPRAZolam (XANAX) 0.5 MG tablet Take 1 tablet (0.5 mg total) by mouth 3 (three) times daily as needed for anxiety.   celecoxib (CELEBREX) 200 MG capsule TAKE 1 CAPSULE BY MOUTH EVERY DAY   gabapentin (NEURONTIN) 300 MG capsule Take 1 capsule by mouth three times a day.   ipratropium (ATROVENT) 0.06 % nasal spray SPRAY 2 SPRAYS INTO EACH NOSTRIL 4 TIMES A DAY   levocetirizine (XYZAL) 5 MG tablet TAKE 1 TABLET BY MOUTH EVERY DAY IN THE EVENING   methocarbamol (ROBAXIN) 500 MG tablet Take 1 tablet (500 mg total) by mouth 2 (two) times daily as needed for muscle spasms.   mometasone (ELOCON) 0.1 %  cream APPLY TO AFFECTED AREA EVERY DAY AS NEEDED   oxyCODONE-acetaminophen (PERCOCET) 10-325 MG tablet Take 1 tablet by mouth every 4 (four) hours as needed for pain (G89.4).   pantoprazole (PROTONIX) 40 MG tablet TAKE 1 TABLET BY MOUTH EVERY DAY   predniSONE (STERAPRED UNI-PAK 21 TAB) 10 MG (21) TBPK tablet Take as directed   sertraline (ZOLOFT) 50 MG tablet TAKE 1 AND 1/2 TABLETS DAILY BY MOUTH   telmisartan-hydrochlorothiazide (MICARDIS HCT) 80-12.5 MG tablet Take 1 tablet by mouth daily.   valACYclovir (VALTREX) 1000 MG tablet Take 2 tablets (2,000 mg total) by mouth 2 (two) times daily.   zolpidem (AMBIEN) 10 MG tablet TAKE 1 TABLET BY MOUTH AT BEDTIME AS NEEDED FOR SLEEP   No current facility-administered medications for this visit. (Other)   REVIEW OF SYSTEMS: ROS   Positive for: Gastrointestinal, Neurological, Eyes Negative for: Constitutional, Skin, Genitourinary, Musculoskeletal, HENT, Endocrine, Cardiovascular, Respiratory, Psychiatric, Allergic/Imm, Heme/Lymph Last edited by Theodore Demark, COA on 08/16/2022  2:03 PM.     ALLERGIES Allergies  Allergen Reactions   Cymbalta [Duloxetine Hcl] Swelling   PAST MEDICAL HISTORY Past Medical History:  Diagnosis Date   Allergy    Anemia    Anxiety    Blood transfusion without reported diagnosis    Cataract    GERD (gastroesophageal reflux disease)    Hypertension  Hypertensive retinopathy    OU   Macular degeneration    Past Surgical History:  Procedure Laterality Date   ABDOMINAL HYSTERECTOMY     ACHILLES TENDON REPAIR Bilateral    APPENDECTOMY     BACK SURGERY     BILATERAL CARPAL TUNNEL RELEASE     CHOLECYSTECTOMY     COLONOSCOPY     HERNIA REPAIR     JOINT REPLACEMENT     MELANOMA EXCISION Right 09/15/2017   Procedure: EXCISION LIPOSARCOMA RIGHT ARM;  Surgeon: Georganna Skeans, MD;  Location: [facility];  Service: General;  Laterality: Right;   SPINE SURGERY     FAMILY HISTORY Family History  Problem  Relation Age of Onset   Hypertension Mother    Cancer Sister    Stroke Maternal Grandmother    SOCIAL HISTORY Social History   Tobacco Use   Smoking status: Never   Smokeless tobacco: Never  Vaping Use   Vaping Use: Never used  Substance Use Topics   Alcohol use: Yes    Alcohol/week: 1.0 standard drink of alcohol    Types: 1 Glasses of wine per week   Drug use: No       OPHTHALMIC EXAM: Base Eye Exam     Visual Acuity (Snellen - Linear)       Right Left   Dist cc 20/50 -1 20/25 +1   Dist ph cc 20/40 -1 NI    Correction: Glasses         Tonometry (Tonopen, 2:01 PM)       Right Left   Pressure 14 19         Pupils       Dark Light Shape React APD   Right 3 2 Round Brisk None   Left 3 2 Round Brisk None         Visual Fields (Counting fingers)       Left Right    Full Full         Extraocular Movement       Right Left    Full, Ortho Full, Ortho         Neuro/Psych     Oriented x3: Yes   Mood/Affect: Normal         Dilation     Both eyes: 1.0% Mydriacyl, 2.5% Phenylephrine @ 2:01 PM           Slit Lamp and Fundus Exam     Slit Lamp Exam       Right Left   Lids/Lashes Dermatochalasis - upper lid, mild Meibomian gland dysfunction Dermatochalasis - upper lid, mild Meibomian gland dysfunction   Conjunctiva/Sclera White and quiet White and quiet   Cornea 2-3+ fine Punctate epithelial erosions 2+ fine Punctate epithelial erosions inferiorly   Anterior Chamber deep, clear, narrow temporal angle deep, clear, narrow temporal angle   Iris Round and dilated Round and dilated   Lens 2-3+ Nuclear sclerosis, 2-3+ Cortical cataract 2-3+ Nuclear sclerosis, 2-3+ Cortical cataract   Anterior Vitreous Vitreous syneresis, Posterior vitreous detachment, prominent vitreous condensation over macula Vitreous syneresis, Posterior vitreous detachment         Fundus Exam       Right Left   Disc Pink and Sharp, Compact, +PPA Pink and Sharp,  temporal PPP, Compact   C/D Ratio 0.2 0.1   Macula Blunted foveal reflex, drusen, RPE mottling, clumping and atrophy, +CNV, persistent central SRF, No heme Flat, good foveal reflex, scattered Drusen, RPE mottling and clumping, focal PEDs, No  heme or edema   Vessels attenuated, Tortuous attenuated, Tortuous   Periphery Attached, mild reticular degeneration, no heme Attached, no heme           Refraction     Wearing Rx       Sphere Cylinder Axis Add   Right +2.00 +0.50 008 +2.50   Left +2.50 +1.00 154 +2.50    Type: PAL           IMAGING AND PROCEDURES  Imaging and Procedures for '@TODAY'$ @  OCT, Retina - OU - Both Eyes       Right Eye Quality was good. Central Foveal Thickness: 276. Progression has been stable. Findings include normal foveal contour, no IRF, no SRF, retinal drusen , pigment epithelial detachment, outer retinal atrophy (persistent central SRF overlying low, stable PEDs).   Left Eye Quality was good. Central Foveal Thickness: 267. Progression has been stable. Findings include normal foveal contour, no IRF, no SRF, retinal drusen .   Notes *Images captured and stored on drive  Diagnosis / Impression:  OD: exu ARMD -- persistent central SRF overlying low, stable PEDs OS: non-exu ARMD -- stable   Clinical management:  See below  Abbreviations: NFP - Normal foveal profile. CME - cystoid macular edema. PED - pigment epithelial detachment. IRF - intraretinal fluid. SRF - subretinal fluid. EZ - ellipsoid zone. ERM - epiretinal membrane. ORA - outer retinal atrophy. ORT - outer retinal tubulation. SRHM - subretinal hyper-reflective material      Intravitreal Injection, Pharmacologic Agent - OD - Right Eye       Time Out 08/16/2022. 2:06 PM. Confirmed correct patient, procedure, site, and patient consented.   Anesthesia Topical anesthesia was used. Anesthetic medications included Lidocaine 2%, Proparacaine 0.5%.   Procedure Preparation included 5%  betadine to ocular surface, eyelid speculum. A (32g) needle was used.   Injection: 2 mg aflibercept 2 MG/0.05ML   Route: Intravitreal, Site: Right Eye   NDC: [id number], Lot: [id number], Expiration date: 12/05/2023, Waste: 0 mL   Post-op Post injection exam found visual acuity of at least counting fingers. The patient tolerated the procedure well. There were no complications. The patient received written and verbal post procedure care education. Post injection medications were not given.            ASSESSMENT/PLAN:    ICD-10-CM   1. Exudative age-related macular degeneration of right eye with active choroidal neovascularization (HCC)  H35.3211 OCT, Retina - OU - Both Eyes    Intravitreal Injection, Pharmacologic Agent - OD - Right Eye    aflibercept (EYLEA) SOLN 2 mg    2. Intermediate stage nonexudative age-related macular degeneration of left eye  H35.3122     3. Essential hypertension  I10     4. Hypertensive retinopathy of both eyes  H35.033     5. Combined forms of age-related cataract of both eyes  H25.813     6. Glaucoma suspect of both eyes  H40.003     7. Dry eyes  H04.123       1. Exudative age related macular degeneration, right eye  - FA (03.10.20) shows +CNV w/ staining/leakage  - repeat FA 09.13.21 shows interval increase in perifoveal leakage OD  - s/p IVA OD #1 (03.10.20), #2 (04.13.20), #3 (05.11.20), #4 (06.23.20), #5 (8.18.20), #6 (10.27.20), #7 (01.26.21), #8 (04.26.21), #9 (08.02.21), #10 (09.13.21), #11 (10.11.21), #12 (11.9.21), #13 (12.14.21), #14 (1.24.22), #15 (3.15.22), #16 (05.24.22), #17 (8.9.22), #18 (10.18.22), #19 (01.03.23), #20 (03.29.23) -- IVA resistance  - s/p  IVE OD #1 (06.07.23), #2 (07.19.23), #3 (09.01.23)  - pt was doing very well with q3 mo maintenance injections, but then developed interval increases in central SRF over several visits -- IVA resistance  - OCT today shows persistent central SRF overlying low, stable PEDs at 6 weeks    - recommend IVE OD #4 today, 10.13.23, with follow up in 4 wks -- due to pt having cataract sx on November 15  - pt wishes to be treated with IVE OD  - RBA of procedure discussed, questions answered  - IVE informed consent obtained and signed, 06.07.23 (OD)  - see procedure note  - f/u in 4 wks -- DFE/OCT/possible injection  2. Age related macular degeneration, non-exudative, left eye  - The incidence, anatomy, and pathology of dry AMD, risk of progression, and the AREDS and AREDS 2 study including smoking risks discussed with patient.  - intermediate stage  - recommend amsler grid monitoring  3,4. Hypertensive retinopathy OU  - discussed importance of tight BP control  - monitor  5. Mixed form age related cataracts OU  - The symptoms of cataract, surgical options, and treatments and risks were discussed with patient  - under the expert management of Dr. Lucianne Lei  - pt planning on cataract surgery w/ Dr. Woody Seller 2023  6. Glaucoma Suspect OU  - IOP 14,19  - under the expert management of Dr. Lucianne Lei  7. Dry eyes OU  - decreased vision OD today mostly due to increased PEE / dry eyes  - pt reports stopping ATs after hearing about product recalls on ATs - recommend artificial tears and lubricating ointment as needed   Ophthalmic Meds Ordered this visit:  Meds ordered this encounter  Medications   aflibercept (EYLEA) SOLN 2 mg   Return in about 4 weeks (around 09/13/2022) for f/u exu ARMD OD, DFE, OCT.  There are no Patient Instructions on file for this visit. This document serves as a record of services personally performed by Gardiner Sleeper, MD, PhD. It was created on their behalf by Renaldo Reel, [place] an ophthalmic technician. The creation of this record is the provider's dictation and/or activities during the visit.    Electronically signed by:  Renaldo Reel, COT  9.29.23 11:28 AM  This document serves as a record of services personally performed by Gardiner Sleeper,  MD, PhD. It was created on their behalf by San Jetty. Owens Shark, OA an ophthalmic technician. The creation of this record is the provider's dictation and/or activities during the visit.    Electronically signed by: San Jetty. Owens Shark, [place] 10.13.2023 11:28 AM   Gardiner Sleeper, M.D., Ph.D. Diseases & Surgery of the Retina and Vitreous [facility]  I have reviewed the above documentation for accuracy and completeness, and I agree with the above. Gardiner Sleeper, M.D., Ph.D. 08/17/22 11:28 AM   Abbreviations: M myopia (nearsighted); A astigmatism; H hyperopia (farsighted); P presbyopia; Mrx spectacle prescription;  CTL contact lenses; OD right eye; OS left eye; OU both eyes  XT exotropia; ET esotropia; PEK punctate epithelial keratitis; PEE punctate epithelial erosions; DES dry eye syndrome; MGD meibomian gland dysfunction; ATs artificial tears; PFAT's preservative free artificial tears; [place] nuclear sclerotic cataract; PSC posterior subcapsular cataract; ERM epi-retinal membrane; PVD posterior vitreous detachment; RD retinal detachment; DM diabetes mellitus; DR diabetic retinopathy; NPDR non-proliferative diabetic retinopathy; PDR proliferative diabetic retinopathy; CSME clinically significant macular edema; DME diabetic macular edema; dbh dot blot hemorrhages; CWS cotton wool spot; POAG  primary open angle glaucoma; C/D cup-to-disc ratio; HVF humphrey visual field; GVF goldmann visual field; OCT optical coherence tomography; IOP intraocular pressure; BRVO Branch retinal vein occlusion; CRVO central retinal vein occlusion; CRAO central retinal artery occlusion; BRAO branch retinal artery occlusion; RT retinal tear; SB scleral buckle; PPV pars plana vitrectomy; VH Vitreous hemorrhage; PRP panretinal laser photocoagulation; IVK intravitreal kenalog; VMT vitreomacular traction; MH Macular hole;  NVD neovascularization of the disc; NVE neovascularization elsewhere; AREDS age related eye disease  study; ARMD age related macular degeneration; POAG primary open angle glaucoma; EBMD epithelial/anterior basement membrane dystrophy; ACIOL anterior chamber intraocular lens; IOL intraocular lens; PCIOL posterior chamber intraocular lens; Phaco/IOL phacoemulsification with intraocular lens placement; [place] photorefractive keratectomy; LASIK laser assisted in situ keratomileusis; HTN hypertension; DM diabetes mellitus; COPD chronic obstructive pulmonary disease

## 2022-08-03 ENCOUNTER — Other Ambulatory Visit: Payer: Self-pay | Admitting: Family Medicine

## 2022-08-05 ENCOUNTER — Other Ambulatory Visit: Payer: Self-pay

## 2022-08-05 DIAGNOSIS — I10 Essential (primary) hypertension: Secondary | ICD-10-CM

## 2022-08-05 MED ORDER — TELMISARTAN-HCTZ 80-12.5 MG PO TABS: 1.0000 | ORAL_TABLET | Freq: Every day | ORAL | 0 refills | Status: DC

## 2022-08-05 NOTE — Telephone Encounter (Signed)
Received eFax from pharmacy to request refill or new script of   telmisartan-hydrochlorothiazide (MICARDIS HCT) 80-12.5 MG tablet  Pharmacy[facility]   [id number] [place] [address] [place] [place] Phone:  [phone] Fax:  [phone] DEA #:  [id number] LOV: 02/19/2022  Please advise pharmacist at [phone]

## 2022-08-06 ENCOUNTER — Telehealth: Payer: Self-pay

## 2022-08-06 NOTE — Telephone Encounter (Signed)
Pt is in need of a refill on her Oxycodone. Last RF was 05/23/2022. Last OV was 02/19/2022. [facility] [place].

## 2022-08-08 ENCOUNTER — Other Ambulatory Visit: Payer: Self-pay | Admitting: Family Medicine

## 2022-08-08 MED ORDER — OXYCODONE-ACETAMINOPHEN 10-325 MG PO TABS: 1.0000 | ORAL_TABLET | ORAL | 0 refills | Status: DC | PRN

## 2022-08-10 IMAGING — MG MM DIGITAL SCREENING BILAT W/ TOMO AND CAD
6 of 12 series · 6 of 36 positions shown · non-contrast
Comparison: Previous exam(s).

ACR Breast Density Category a: The breast tissue is almost entirely
fatty.

CLINICAL DATA: Screening.

EXAM:
DIGITAL SCREENING BILATERAL MAMMOGRAM WITH TOMOSYNTHESIS AND CAD
TECHNIQUE: Bilateral screening digital craniocaudal and mediolateral oblique
mammograms were obtained. Bilateral screening digital breast
tomosynthesis was performed. The images were evaluated with
computer-aided detection.

[R MLO synth-2D]
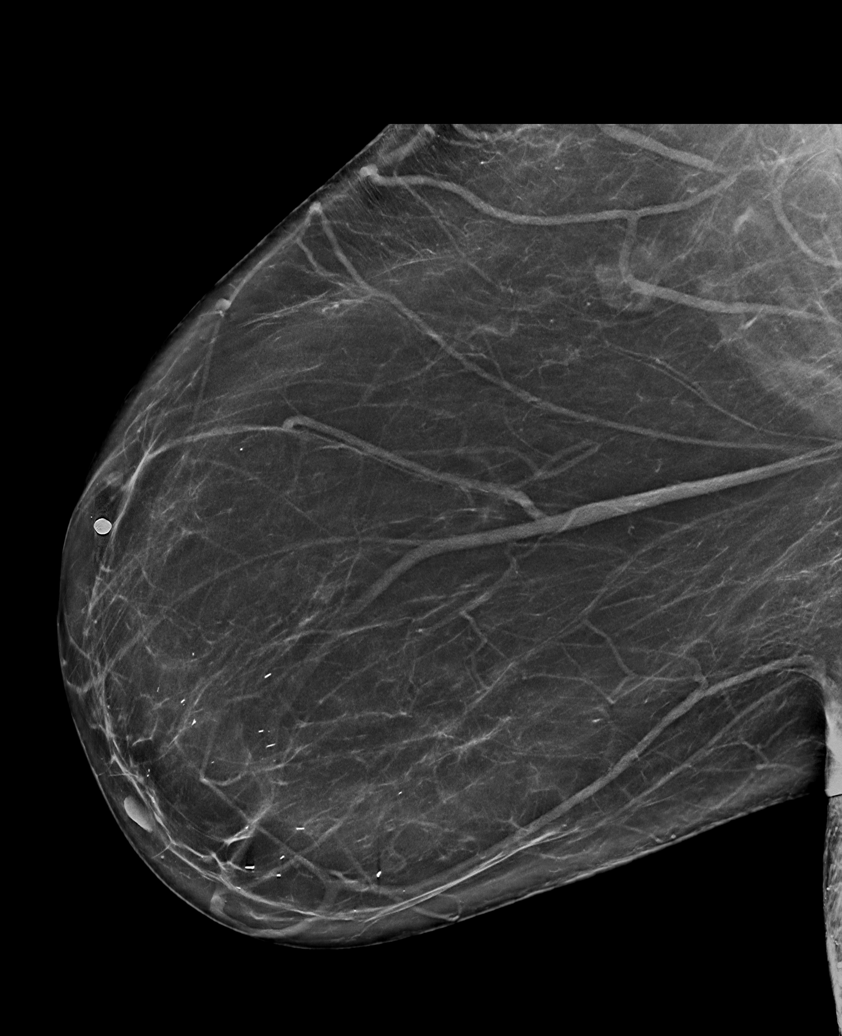

[L CC synth-2D]
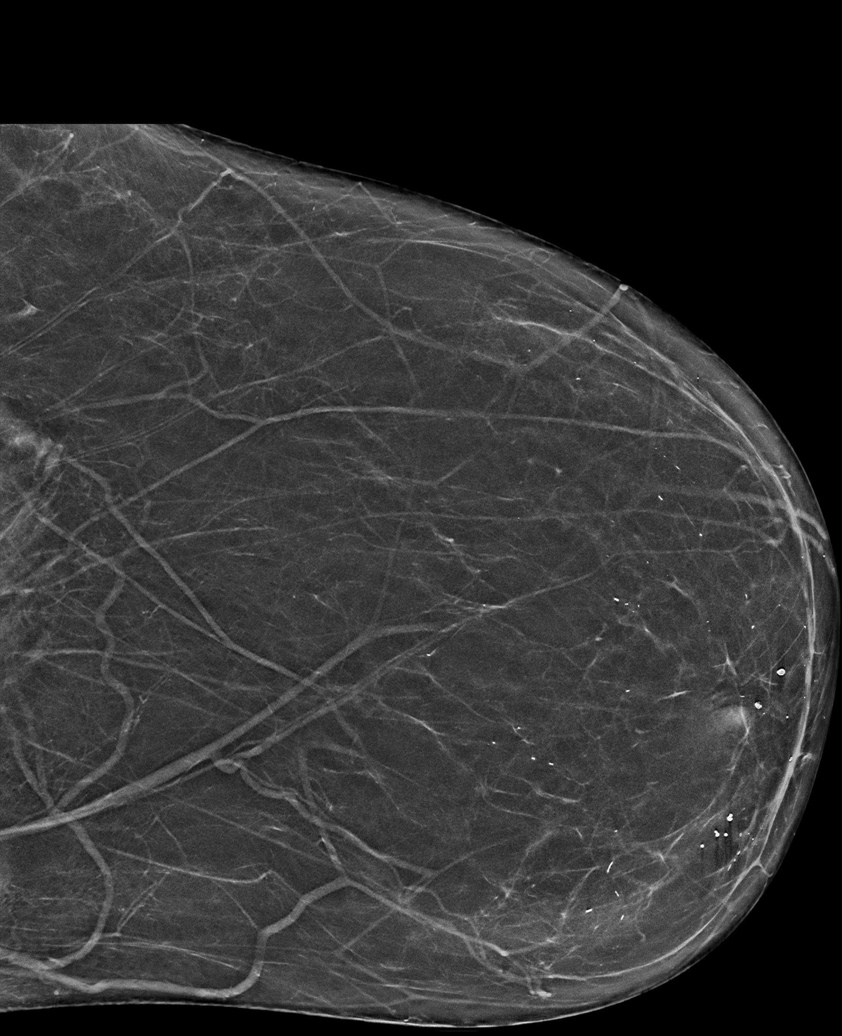

[L MLO synth-2D (1 of 2)]
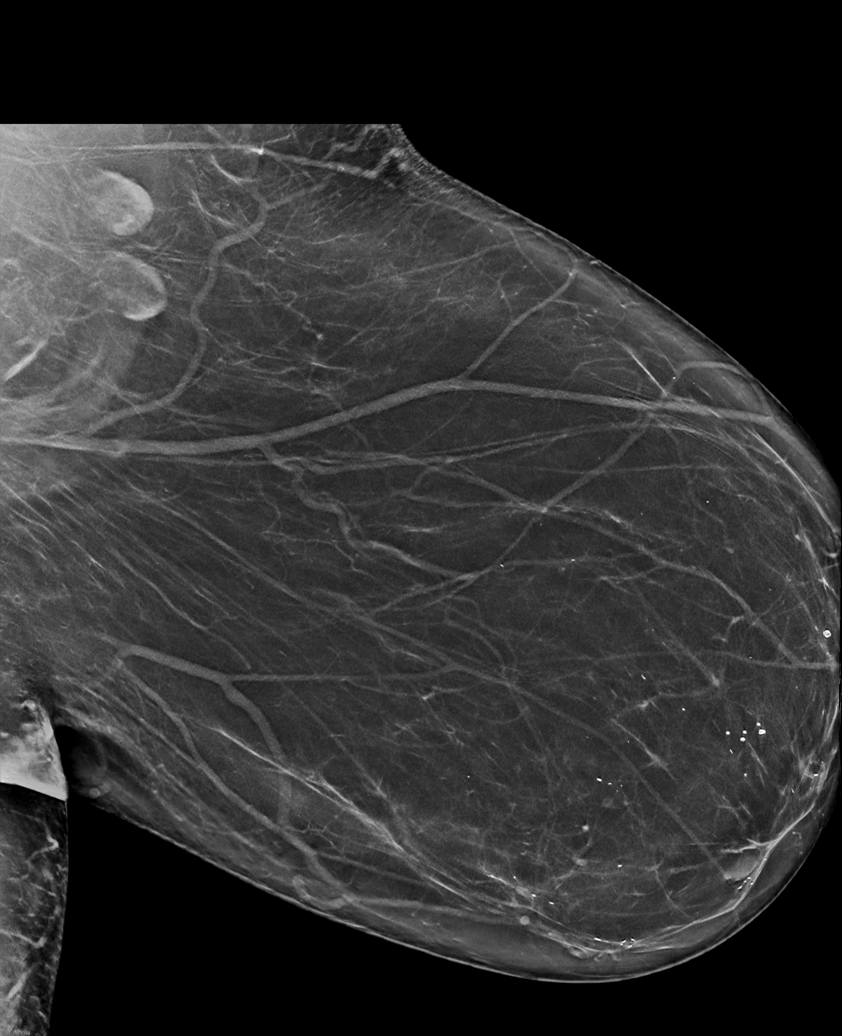

[L MLO synth-2D (2 of 2)]
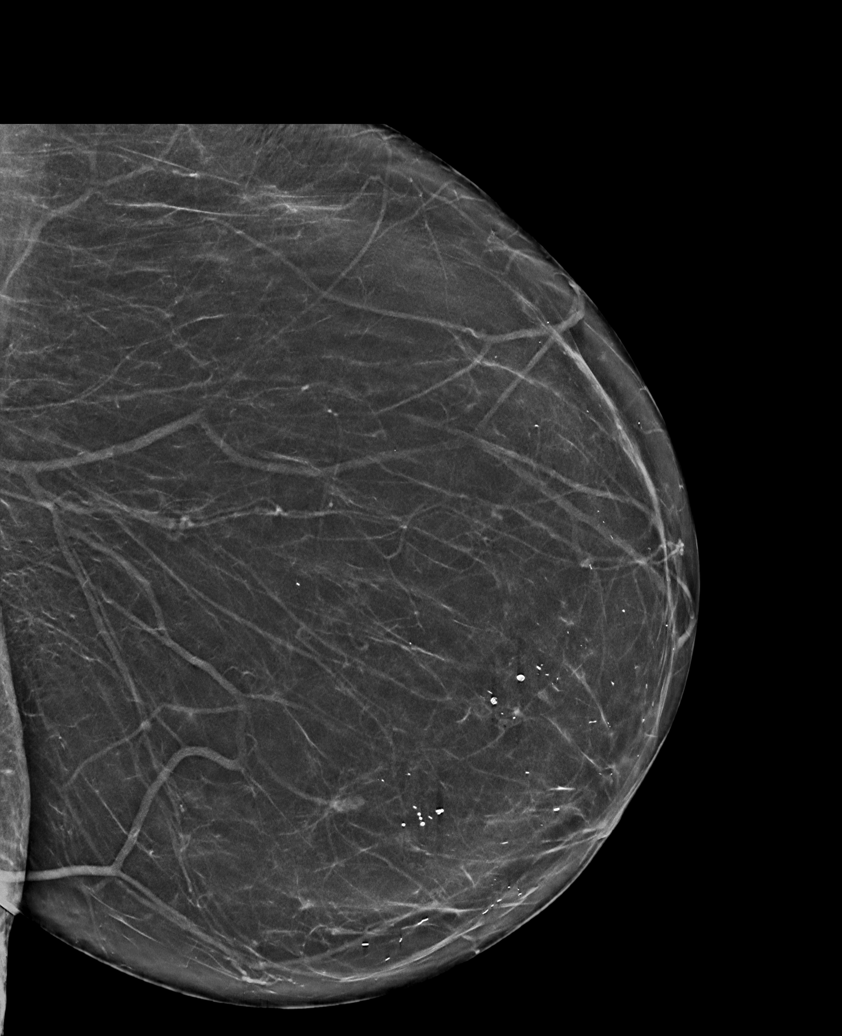

[R CC synth-2D (1 of 2)]
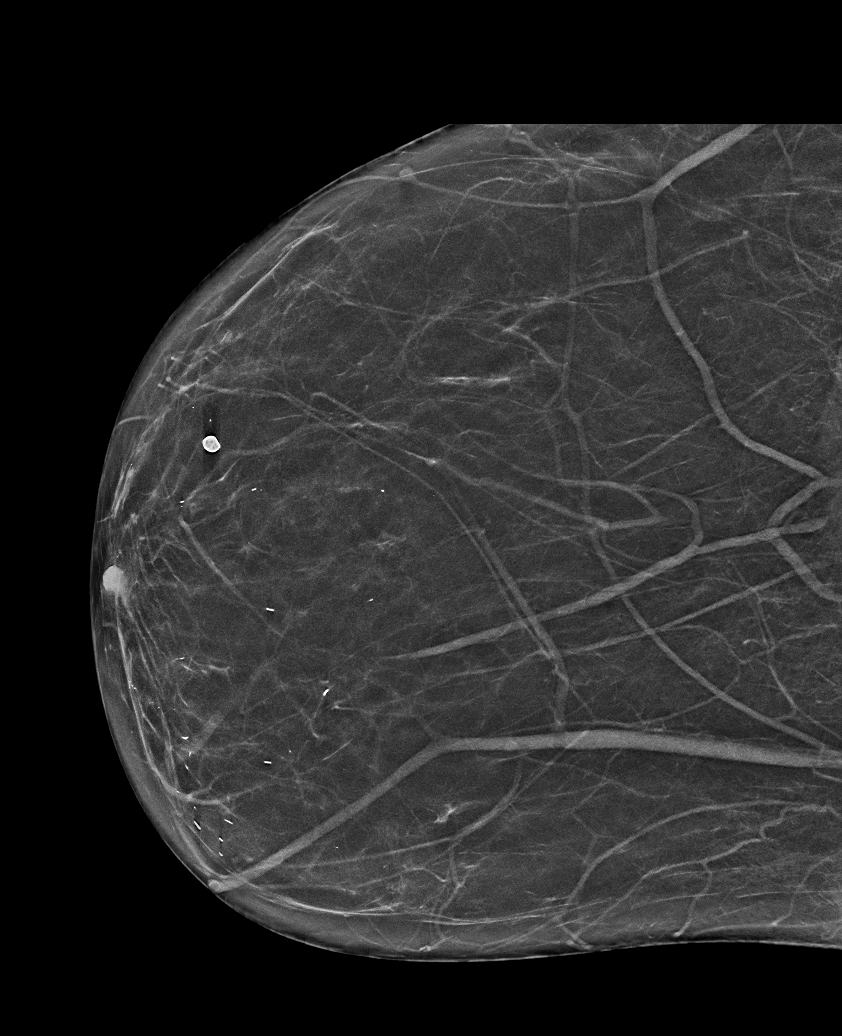

[R CC synth-2D (2 of 2)]
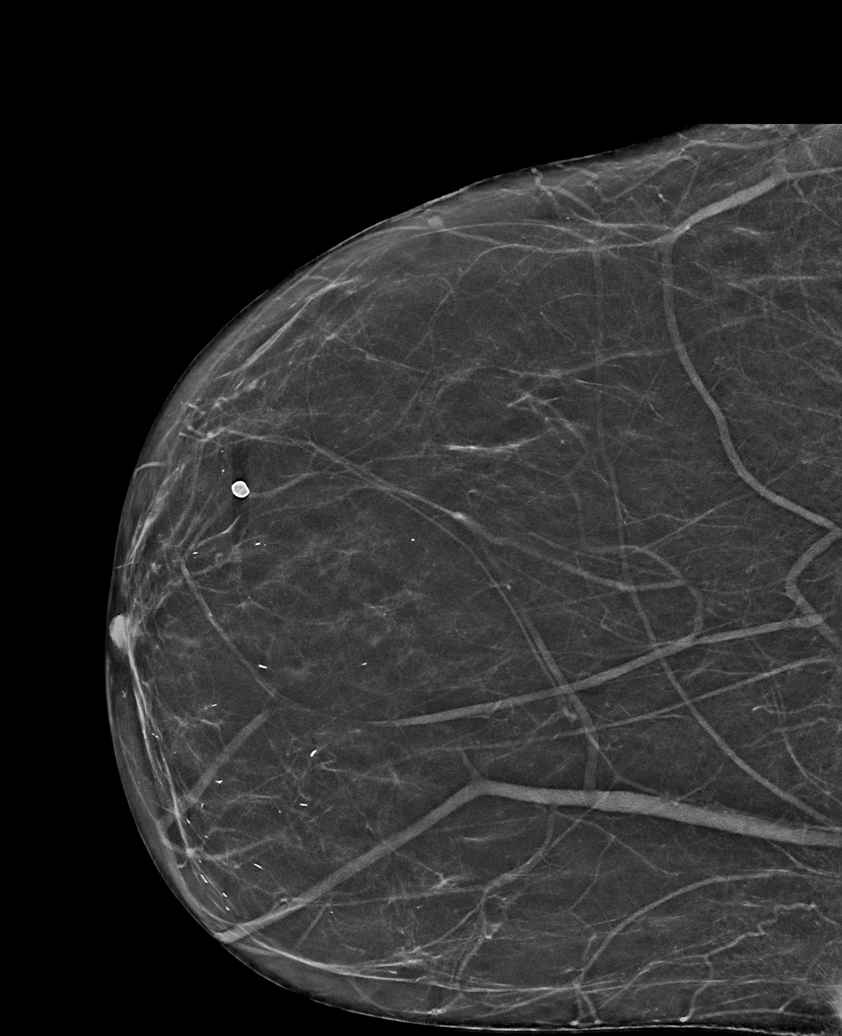

[6 of 36 positions shown; findings below may reference images not displayed]

FINDINGS: There are no findings suspicious for malignancy.
IMPRESSION: No mammographic evidence of malignancy. A result letter of this
screening mammogram will be mailed directly to the patient.

RECOMMENDATION:
Screening mammogram in one year. (Code:0E-[id number])

BI-RADS CATEGORY  1: Negative.

## 2022-08-16 ENCOUNTER — Ambulatory Visit (INDEPENDENT_AMBULATORY_CARE_PROVIDER_SITE_OTHER): Payer: Medicare Other | Admitting: Ophthalmology

## 2022-08-16 ENCOUNTER — Encounter (INDEPENDENT_AMBULATORY_CARE_PROVIDER_SITE_OTHER): Payer: Self-pay | Admitting: Ophthalmology

## 2022-08-16 DIAGNOSIS — H35033 Hypertensive retinopathy, bilateral: Secondary | ICD-10-CM

## 2022-08-16 DIAGNOSIS — H353122 Nonexudative age-related macular degeneration, left eye, intermediate dry stage: Secondary | ICD-10-CM

## 2022-08-16 DIAGNOSIS — H04123 Dry eye syndrome of bilateral lacrimal glands: Secondary | ICD-10-CM | POA: Diagnosis not present

## 2022-08-16 DIAGNOSIS — H40003 Preglaucoma, unspecified, bilateral: Secondary | ICD-10-CM

## 2022-08-16 DIAGNOSIS — H353211 Exudative age-related macular degeneration, right eye, with active choroidal neovascularization: Secondary | ICD-10-CM

## 2022-08-16 DIAGNOSIS — H25813 Combined forms of age-related cataract, bilateral: Secondary | ICD-10-CM | POA: Diagnosis not present

## 2022-08-16 DIAGNOSIS — I10 Essential (primary) hypertension: Secondary | ICD-10-CM | POA: Diagnosis not present

## 2022-08-16 MED ORDER — AFLIBERCEPT 2MG/0.05ML IZ SOLN FOR KALEIDOSCOPE
2.0000 mg | INTRAVITREAL | Status: AC | PRN
  Administered 2022-08-16: 2 mg via INTRAVITREAL

## 2022-09-06 ENCOUNTER — Other Ambulatory Visit: Payer: Self-pay

## 2022-09-06 DIAGNOSIS — L299 Pruritus, unspecified: Secondary | ICD-10-CM

## 2022-09-06 MED ORDER — MOMETASONE FUROATE 0.1 % EX CREA: TOPICAL_CREAM | CUTANEOUS | 0 refills | Status: DC

## 2022-09-06 NOTE — Telephone Encounter (Signed)
Requested medication (s) are due for refill today: yes  Requested medication (s) are on the active medication list: yes  Last refill:  06/07/22 45 g with 0 RF  Future visit scheduled: no, seen 02/19/22  Notes to clinic:  no protocol for this med, please assess.      Requested Prescriptions  Pending Prescriptions Disp Refills   mometasone (ELOCON) 0.1 % cream 45 g 0    Sig: APPLY TO AFFECTED AREA EVERY DAY AS NEEDED     Off-Protocol Failed - 09/06/2022  2:28 PM      Failed - Medication not assigned to a protocol, review manually.      Passed - Valid encounter within last 12 months    Recent Outpatient Visits           6 months ago Benign essential HTN   [place] Pickard, Cammie Mcgee, MD   1 year ago Benign essential HTN   [place] [facility] Cammie Mcgee, MD   2 years ago Benign essential HTN   [place] Susy Frizzle, MD   2 years ago Acute idiopathic gout of right wrist   [place] Pickard, Cammie Mcgee, MD   2 years ago General medical exam   [place] Pickard, Cammie Mcgee, MD

## 2022-09-06 NOTE — Telephone Encounter (Signed)
  Prescription Request  09/06/2022  Is this a "Controlled Substance" medicine? No  LOV: 08/06/2022   What is the name of the medication or equipment? mometasone (ELOCON) 0.1 % cream [[id number]]   Have you contacted your pharmacy to request a refill? Yes   Which pharmacy would you like this sent to?  [id number] [address]: [phone]: [phone]  Patient notified that their request is being sent to the clinical staff for review and that they should receive a response within 2 business days.   Please advise at [phone]

## 2022-09-06 NOTE — Telephone Encounter (Signed)
  Prescription Request  09/06/2022  Is this a "Controlled Substance" medicine? No  LOV: 08/06/2022   What is the name of the medication or equipment? ipratropium (ATROVENT) 0.06 % nasal spray [[id number]]   Have you contacted your pharmacy to request a refill? Yes   Which pharmacy would you like this sent to?  [id number] [address]: [phone]: [phone]  Patient notified that their request is being sent to the clinical staff for review and that they should receive a response within 2 business days.   Please advise at [phone]

## 2022-09-13 ENCOUNTER — Encounter (INDEPENDENT_AMBULATORY_CARE_PROVIDER_SITE_OTHER): Payer: Self-pay | Admitting: Ophthalmology

## 2022-09-13 ENCOUNTER — Ambulatory Visit (INDEPENDENT_AMBULATORY_CARE_PROVIDER_SITE_OTHER): Payer: Medicare Other | Admitting: Ophthalmology

## 2022-09-13 DIAGNOSIS — H353211 Exudative age-related macular degeneration, right eye, with active choroidal neovascularization: Secondary | ICD-10-CM | POA: Diagnosis not present

## 2022-09-13 DIAGNOSIS — I10 Essential (primary) hypertension: Secondary | ICD-10-CM | POA: Diagnosis not present

## 2022-09-13 DIAGNOSIS — H04123 Dry eye syndrome of bilateral lacrimal glands: Secondary | ICD-10-CM

## 2022-09-13 DIAGNOSIS — H25813 Combined forms of age-related cataract, bilateral: Secondary | ICD-10-CM | POA: Diagnosis not present

## 2022-09-13 DIAGNOSIS — H353122 Nonexudative age-related macular degeneration, left eye, intermediate dry stage: Secondary | ICD-10-CM | POA: Diagnosis not present

## 2022-09-13 DIAGNOSIS — H35033 Hypertensive retinopathy, bilateral: Secondary | ICD-10-CM

## 2022-09-13 DIAGNOSIS — H40003 Preglaucoma, unspecified, bilateral: Secondary | ICD-10-CM | POA: Diagnosis not present

## 2022-09-13 MED ORDER — AFLIBERCEPT 2MG/0.05ML IZ SOLN FOR KALEIDOSCOPE
2.0000 mg | INTRAVITREAL | Status: AC | PRN
  Administered 2022-09-13: 2 mg via INTRAVITREAL

## 2022-09-13 NOTE — Progress Notes (Signed)
[facility] Note  09/13/2022   CHIEF COMPLAINT Patient presents for Retina Follow Up  HISTORY OF PRESENT ILLNESS: Faith Reeves is a 71 y.o. female who presents to the clinic today for:   HPI     Retina Follow Up   Patient presents with  Wet AMD.  In right eye.  This started 4 weeks ago.  Duration of 4.  I, the attending physician,  performed the HPI with the patient and updated documentation appropriately.        Comments   4 week AMD OD and I'VE OD pt states no vision changes noticed       Last edited by Bernarda Caffey, MD on 09/13/2022  3:09 PM.    Pt states vision is stable, she is having cataract surgery OD on November 15  Referring physician: Lisabeth Pick, MD [address] [place],  [place] [address]  HISTORICAL INFORMATION:   Selected notes from the MEDICAL RECORD NUMBER Referred by Dr. Quentin Ore for concern of exu ARMD OU   CURRENT MEDICATIONS: No current outpatient medications on file. (Ophthalmic Drugs)   No current facility-administered medications for this visit. (Ophthalmic Drugs)   Current Outpatient Medications (Other)  Medication Sig   albuterol (PROVENTIL HFA;VENTOLIN HFA) 108 (90 Base) MCG/ACT inhaler Inhale 2 puffs into the lungs every 4 (four) hours as needed for wheezing or shortness of breath.   ALPRAZolam (XANAX) 0.5 MG tablet Take 1 tablet (0.5 mg total) by mouth 3 (three) times daily as needed for anxiety.   celecoxib (CELEBREX) 200 MG capsule TAKE 1 CAPSULE BY MOUTH EVERY DAY   gabapentin (NEURONTIN) 300 MG capsule Take 1 capsule by mouth three times a day.   ipratropium (ATROVENT) 0.06 % nasal spray SPRAY 2 SPRAYS INTO EACH NOSTRIL 4 TIMES A DAY   levocetirizine (XYZAL) 5 MG tablet TAKE 1 TABLET BY MOUTH EVERY DAY IN THE EVENING   methocarbamol (ROBAXIN) 500 MG tablet Take 1 tablet (500 mg total) by mouth 2 (two) times daily as needed for muscle spasms.   mometasone (ELOCON) 0.1 % cream APPLY TO AFFECTED  AREA EVERY DAY AS NEEDED   oxyCODONE-acetaminophen (PERCOCET) 10-325 MG tablet Take 1 tablet by mouth every 4 (four) hours as needed for pain (G89.4).   pantoprazole (PROTONIX) 40 MG tablet TAKE 1 TABLET BY MOUTH EVERY DAY   predniSONE (STERAPRED UNI-PAK 21 TAB) 10 MG (21) TBPK tablet Take as directed   sertraline (ZOLOFT) 50 MG tablet TAKE 1 AND 1/2 TABLETS DAILY BY MOUTH   telmisartan-hydrochlorothiazide (MICARDIS HCT) 80-12.5 MG tablet Take 1 tablet by mouth daily.   valACYclovir (VALTREX) 1000 MG tablet Take 2 tablets (2,000 mg total) by mouth 2 (two) times daily.   zolpidem (AMBIEN) 10 MG tablet TAKE 1 TABLET BY MOUTH AT BEDTIME AS NEEDED FOR SLEEP   No current facility-administered medications for this visit. (Other)   REVIEW OF SYSTEMS:   ALLERGIES Allergies  Allergen Reactions   Cymbalta [Duloxetine Hcl] Swelling   PAST MEDICAL HISTORY Past Medical History:  Diagnosis Date   Allergy    Anemia    Anxiety    Blood transfusion without reported diagnosis    Cataract    GERD (gastroesophageal reflux disease)    Hypertension    Hypertensive retinopathy    OU   Macular degeneration    Past Surgical History:  Procedure Laterality Date   ABDOMINAL HYSTERECTOMY     ACHILLES TENDON REPAIR Bilateral    APPENDECTOMY  BACK SURGERY     BILATERAL CARPAL TUNNEL RELEASE     CHOLECYSTECTOMY     COLONOSCOPY     HERNIA REPAIR     JOINT REPLACEMENT     MELANOMA EXCISION Right 09/15/2017   Procedure: EXCISION LIPOSARCOMA RIGHT ARM;  Surgeon: Georganna Skeans, MD;  Location: [facility];  Service: General;  Laterality: Right;   SPINE SURGERY     FAMILY HISTORY Family History  Problem Relation Age of Onset   Hypertension Mother    Cancer Sister    Stroke Maternal Grandmother    SOCIAL HISTORY Social History   Tobacco Use   Smoking status: Never   Smokeless tobacco: Never  Vaping Use   Vaping Use: Never used  Substance Use Topics   Alcohol use: Yes    Alcohol/week: 1.0  standard drink of alcohol    Types: 1 Glasses of wine per week   Drug use: No       OPHTHALMIC EXAM: Base Eye Exam     Visual Acuity (Snellen - Linear)       Right Left   Dist cc 20/50 20/30 -1   Dist ph cc 20/30     Correction: Glasses         Tonometry (Tonopen, 1:44 PM)       Right Left   Pressure 14 20         Pupils       Dark Light Shape React APD   Right 3 2 Round Brisk None   Left 3 2 Round Brisk None         Visual Fields       Left Right    Full Full         Extraocular Movement       Right Left    Full, Ortho Full, Ortho         Neuro/Psych     Oriented x3: Yes   Mood/Affect: Normal         Dilation     Both eyes: 2.5% Phenylephrine @ 1:44 PM           Slit Lamp and Fundus Exam     Slit Lamp Exam       Right Left   Lids/Lashes Dermatochalasis - upper lid, mild Meibomian gland dysfunction Dermatochalasis - upper lid, mild Meibomian gland dysfunction   Conjunctiva/Sclera White and quiet White and quiet   Cornea 2-3+ fine Punctate epithelial erosions 2+ fine Punctate epithelial erosions inferiorly   Anterior Chamber deep, clear, narrow temporal angle deep, clear, narrow temporal angle   Iris Round and dilated Round and dilated   Lens 2-3+ Nuclear sclerosis, 2-3+ Cortical cataract 2-3+ Nuclear sclerosis, 2-3+ Cortical cataract   Anterior Vitreous Vitreous syneresis, Posterior vitreous detachment, prominent vitreous condensation over macula Vitreous syneresis, Posterior vitreous detachment         Fundus Exam       Right Left   Disc Pink and Sharp, Compact, +PPA Pink and Sharp, temporal PPP, Compact   C/D Ratio 0.2 0.1   Macula Blunted foveal reflex, drusen, RPE mottling, clumping and atrophy, +CNV, persistent central SRF -- slightly improved, No heme Flat, good foveal reflex, scattered Drusen, RPE mottling and clumping, focal PEDs, No heme or edema   Vessels attenuated, Tortuous attenuated, Tortuous   Periphery  Attached, mild reticular degeneration, no heme Attached, no heme           Refraction     Wearing Rx  Sphere Cylinder Axis Add   Right +2.00 +0.50 008 +2.50   Left +2.50 +1.00 154 +2.50    Type: PAL           IMAGING AND PROCEDURES  Imaging and Procedures for '@TODAY'$ @  OCT, Retina - OU - Both Eyes       Right Eye Quality was good. Central Foveal Thickness: 253. Progression has improved. Findings include normal foveal contour, no IRF, no SRF, retinal drusen , pigment epithelial detachment, outer retinal atrophy (Mild interval improvement in central SRF overlying low, stable PEDs).   Left Eye Quality was good. Central Foveal Thickness: 263. Progression has been stable. Findings include normal foveal contour, no IRF, no SRF, retinal drusen .   Notes *Images captured and stored on drive  Diagnosis / Impression:  OD: exu ARMD -- Mild interval improvement in central SRF overlying low, stable PEDs OS: non-exu ARMD -- stable   Clinical management:  See below  Abbreviations: NFP - Normal foveal profile. CME - cystoid macular edema. PED - pigment epithelial detachment. IRF - intraretinal fluid. SRF - subretinal fluid. EZ - ellipsoid zone. ERM - epiretinal membrane. ORA - outer retinal atrophy. ORT - outer retinal tubulation. SRHM - subretinal hyper-reflective material      Intravitreal Injection, Pharmacologic Agent - OD - Right Eye       Time Out 09/13/2022. 2:06 PM. Confirmed correct patient, procedure, site, and patient consented.   Anesthesia Topical anesthesia was used. Anesthetic medications included Lidocaine 2%, Proparacaine 0.5%.   Procedure Preparation included 5% betadine to ocular surface, eyelid speculum. A (32g) needle was used.   Injection: 2 mg aflibercept 2 MG/0.05ML   Route: Intravitreal, Site: Right Eye   NDC: [id number], Lot: [id number], Expiration date: 03/05/2023, Waste: 0 mL   Post-op Post injection exam found visual acuity of at  least counting fingers. The patient tolerated the procedure well. There were no complications. The patient received written and verbal post procedure care education. Post injection medications were not given.            ASSESSMENT/PLAN:    ICD-10-CM   1. Exudative age-related macular degeneration of right eye with active choroidal neovascularization (HCC)  H35.3211 OCT, Retina - OU - Both Eyes    Intravitreal Injection, Pharmacologic Agent - OD - Right Eye    aflibercept (EYLEA) SOLN 2 mg    2. Intermediate stage nonexudative age-related macular degeneration of left eye  H35.3122     3. Essential hypertension  I10     4. Hypertensive retinopathy of both eyes  H35.033     5. Combined forms of age-related cataract of both eyes  H25.813     6. Glaucoma suspect of both eyes  H40.003     7. Dry eyes  H04.123      1. Exudative age related macular degeneration, right eye  - FA (03.10.20) shows +CNV w/ staining/leakage  - repeat FA 09.13.21 shows interval increase in perifoveal leakage OD  - s/p IVA OD #1 (03.10.20), #2 (04.13.20), #3 (05.11.20), #4 (06.23.20), #5 (8.18.20), #6 (10.27.20), #7 (01.26.21), #8 (04.26.21), #9 (08.02.21), #10 (09.13.21), #11 (10.11.21), #12 (11.9.21), #13 (12.14.21), #14 (1.24.22), #15 (3.15.22), #16 (05.24.22), #17 (8.9.22), #18 (10.18.22), #19 (01.03.23), #20 (03.29.23) -- IVA resistance  - s/p IVE OD #1 (06.07.23), #2 (07.19.23), #3 (09.01.23), #4 (10.13.23)  - pt was doing very well with q3 mo maintenance injections, but then developed interval increases in central SRF over several visits -- IVA resistance  - OCT today shows  mild interval improvement in central SRF overlying low, stable PEDs at 4 weeks    - discussed possible resistance to [facility] -- insurance okay for V[facility], will consider switch at next visit - recommend IVE OD #5 today, 11.10.23, with follow up in 6 wks  - pt wishes to be treated with IVE OD  - RBA of procedure discussed, questions  answered  - IVE informed consent obtained and signed, 06.07.23 (OD)  - see procedure note  - f/u in 6 wks -- DFE/OCT/possible injection  2. Age related macular degeneration, non-exudative, left eye  - The incidence, anatomy, and pathology of dry AMD, risk of progression, and the AREDS and AREDS 2 study including smoking risks discussed with patient.  - intermediate stage  - recommend amsler grid monitoring  3,4. Hypertensive retinopathy OU  - discussed importance of tight BP control  - monitor  5. Mixed form age related cataracts OU  - The symptoms of cataract, surgical options, and treatments and risks were discussed with patient  - under the expert management of Dr. Lucianne Lei  - cataract surgery OD scheduled for Sep 18, 2022 w/ Dr. Lucianne Lei  6. Glaucoma Suspect OU  - IOP 14,20  - under the expert management of Dr. Lucianne Lei  7. Dry eyes OU  - decreased vision OD today mostly due to increased PEE / dry eyes  - pt reports stopping ATs after hearing about product recalls on ATs - recommend artificial tears and lubricating ointment as needed   Ophthalmic Meds Ordered this visit:  Meds ordered this encounter  Medications   aflibercept (EYLEA) SOLN 2 mg   Return in about 6 weeks (around 10/25/2022) for f/u exu ARMD OD, DFE, OCT.  There are no Patient Instructions on file for this visit.  This document serves as a record of services personally performed by Gardiner Sleeper, MD, PhD. It was created on their behalf by San Jetty. Owens Shark, OA an ophthalmic technician. The creation of this record is the provider's dictation and/or activities during the visit.    Electronically signed by: San Jetty. [place], [place] 11.10.2023 3:20 PM  Gardiner Sleeper, M.D., Ph.D. Diseases & Surgery of the Retina and Vitreous [facility]  I have reviewed the above documentation for accuracy and completeness, and I agree with the above. Gardiner Sleeper, M.D., Ph.D. 09/13/22 3:20 PM  Abbreviations: M  myopia (nearsighted); A astigmatism; H hyperopia (farsighted); P presbyopia; Mrx spectacle prescription;  CTL contact lenses; OD right eye; OS left eye; OU both eyes  XT exotropia; ET esotropia; PEK punctate epithelial keratitis; PEE punctate epithelial erosions; DES dry eye syndrome; MGD meibomian gland dysfunction; ATs artificial tears; PFAT's preservative free artificial tears; [place] nuclear sclerotic cataract; PSC posterior subcapsular cataract; ERM epi-retinal membrane; PVD posterior vitreous detachment; RD retinal detachment; DM diabetes mellitus; DR diabetic retinopathy; NPDR non-proliferative diabetic retinopathy; PDR proliferative diabetic retinopathy; CSME clinically significant macular edema; DME diabetic macular edema; dbh dot blot hemorrhages; CWS cotton wool spot; POAG primary open angle glaucoma; C/D cup-to-disc ratio; HVF humphrey visual field; GVF goldmann visual field; OCT optical coherence tomography; IOP intraocular pressure; BRVO Branch retinal vein occlusion; CRVO central retinal vein occlusion; CRAO central retinal artery occlusion; BRAO branch retinal artery occlusion; RT retinal tear; SB scleral buckle; PPV pars plana vitrectomy; VH Vitreous hemorrhage; PRP panretinal laser photocoagulation; IVK intravitreal kenalog; VMT vitreomacular traction; MH Macular hole;  NVD neovascularization of the disc; NVE neovascularization elsewhere; AREDS age related eye disease study; ARMD age related  macular degeneration; POAG primary open angle glaucoma; EBMD epithelial/anterior basement membrane dystrophy; ACIOL anterior chamber intraocular lens; IOL intraocular lens; PCIOL posterior chamber intraocular lens; Phaco/IOL phacoemulsification with intraocular lens placement; [place] photorefractive keratectomy; LASIK laser assisted in situ keratomileusis; HTN hypertension; DM diabetes mellitus; COPD chronic obstructive pulmonary disease

## 2022-09-18 DIAGNOSIS — H2511 Age-related nuclear cataract, right eye: Secondary | ICD-10-CM | POA: Diagnosis not present

## 2022-09-18 DIAGNOSIS — H268 Other specified cataract: Secondary | ICD-10-CM | POA: Diagnosis not present

## 2022-09-18 HISTORY — PX: EYE SURGERY: SHX253

## 2022-10-03 ENCOUNTER — Other Ambulatory Visit: Payer: Self-pay

## 2022-10-03 ENCOUNTER — Telehealth: Payer: Self-pay

## 2022-10-03 DIAGNOSIS — T7840XD Allergy, unspecified, subsequent encounter: Secondary | ICD-10-CM

## 2022-10-03 MED ORDER — IPRATROPIUM BROMIDE 0.06 % NA SOLN: NASAL | 1 refills | Status: DC

## 2022-10-03 NOTE — Telephone Encounter (Signed)
  Prescription Request  10/03/2022  Is this a "Controlled Substance" medicine? No  LOV: 09/06/2022   What is the name of the medication or equipment? ipratropium (ATROVENT) 0.06 % nasal spray [[id number]]   Have you contacted your pharmacy to request a refill? Yes   Which pharmacy would you like this sent to?  [id number]: [phone]: [phone]  Patient notified that their request is being sent to the clinical staff for review and that they should receive a response within 2 business days.   Please advise at [phone]

## 2022-10-10 ENCOUNTER — Ambulatory Visit (INDEPENDENT_AMBULATORY_CARE_PROVIDER_SITE_OTHER): Payer: Medicare Other

## 2022-10-10 VITALS — BP 116/72 | Ht 59.0 in | Wt 234.0 lb

## 2022-10-10 DIAGNOSIS — Z0000 Encounter for general adult medical examination without abnormal findings: Secondary | ICD-10-CM

## 2022-10-10 NOTE — Patient Instructions (Signed)
Faith Reeves , Thank you for taking time to come for your Medicare Wellness Visit. I appreciate your ongoing commitment to your health goals. Please review the following plan we discussed and let me know if I can assist you in the future.   These are the goals we discussed:  Goals      Weight (lb) < 200 lb (90.7 kg)        This is a list of the screening recommended for you and due dates:  Health Maintenance  Topic Date Due   DTaP/Tdap/Td vaccine (1 - Tdap) Never done   Zoster (Shingles) Vaccine (1 of 2) Never done   Colon Cancer Screening  11/04/2020   Flu Shot  06/04/2022   COVID-19 Vaccine (3 - 2023-24 season) 07/05/2022   Mammogram  02/28/2023   Medicare Annual Wellness Visit  10/11/2023   Pneumonia Vaccine  Completed   DEXA scan (bone density measurement)  Completed   Hepatitis C Screening: USPSTF Recommendation to screen - Ages 73-79 yo.  Completed   HPV Vaccine  Aged Out    Advanced directives: Advance directive discussed with you today. I have provided a copy for you to complete at home and have notarized. Once this is complete please bring a copy in to our office so we can scan it into your chart.   Conditions/risks identified: Aim for 30 minutes of exercise or brisk walking, 6-8 glasses of water, and 5 servings of fruits and vegetables each day.   Next appointment: Follow up in one year for your annual wellness visit    Preventive Care 65 Years and Older, Female Preventive care refers to lifestyle choices and visits with your health care provider that can promote health and wellness. What does preventive care include? A yearly physical exam. This is also called an annual well check. Dental exams once or twice a year. Routine eye exams. Ask your health care provider how often you should have your eyes checked. Personal lifestyle choices, including: Daily care of your teeth and gums. Regular physical activity. Eating a healthy diet. Avoiding tobacco and drug  use. Limiting alcohol use. Practicing safe sex. Taking low-dose aspirin every day. Taking vitamin and mineral supplements as recommended by your health care provider. What happens during an annual well check? The services and screenings done by your health care provider during your annual well check will depend on your age, overall health, lifestyle risk factors, and family history of disease. Counseling  Your health care provider may ask you questions about your: Alcohol use. Tobacco use. Drug use. Emotional well-being. Home and relationship well-being. Sexual activity. Eating habits. History of falls. Memory and ability to understand (cognition). Work and work Statistician. Reproductive health. Screening  You may have the following tests or measurements: Height, weight, and BMI. Blood pressure. Lipid and cholesterol levels. These may be checked every 5 years, or more frequently if you are over 30 years old. Skin check. Lung cancer screening. You may have this screening every year starting at age 57 if you have a 30-pack-year history of smoking and currently smoke or have quit within the past 15 years. Fecal occult blood test (FOBT) of the stool. You may have this test every year starting at age 81. Flexible sigmoidoscopy or colonoscopy. You may have a sigmoidoscopy every 5 years or a colonoscopy every 10 years starting at age 32. Hepatitis C blood test. Hepatitis B blood test. Sexually transmitted disease (STD) testing. Diabetes screening. This is done by checking your blood sugar (  glucose) after you have not eaten for a while (fasting). You may have this done every 1-3 years. Bone density scan. This is done to screen for osteoporosis. You may have this done starting at age 54. Mammogram. This may be done every 1-2 years. Talk to your health care provider about how often you should have regular mammograms. Talk with your health care provider about your test results, treatment  options, and if necessary, the need for more tests. Vaccines  Your health care provider may recommend certain vaccines, such as: Influenza vaccine. This is recommended every year. Tetanus, diphtheria, and acellular pertussis (Tdap, Td) vaccine. You may need a Td booster every 10 years. Zoster vaccine. You may need this after age 74. Pneumococcal 13-valent conjugate (PCV13) vaccine. One dose is recommended after age 71. Pneumococcal polysaccharide (PPSV23) vaccine. One dose is recommended after age 45. Talk to your health care provider about which screenings and vaccines you need and how often you need them. This information is not intended to replace advice given to you by your health care provider. Make sure you discuss any questions you have with your health care provider. Document Released: 11/17/2015 Document Revised: 07/10/2016 Document Reviewed: 08/22/2015 Elsevier Interactive Patient Education  2017 [employer] Prevention in the Home Falls can cause injuries. They can happen to people of all ages. There are many things you can do to make your home safe and to help prevent falls. What can I do on the outside of my home? Regularly fix the edges of walkways and driveways and fix any cracks. Remove anything that might make you trip as you walk through a door, such as a raised step or threshold. Trim any bushes or trees on the path to your home. Use bright outdoor lighting. Clear any walking paths of anything that might make someone trip, such as rocks or tools. Regularly check to see if handrails are loose or broken. Make sure that both sides of any steps have handrails. Any raised decks and porches should have guardrails on the edges. Have any leaves, snow, or ice cleared regularly. Use sand or salt on walking paths during winter. Clean up any spills in your garage right away. This includes oil or grease spills. What can I do in the bathroom? Use night lights. Install grab  bars by the toilet and in the tub and shower. Do not use towel bars as grab bars. Use non-skid mats or decals in the tub or shower. If you need to sit down in the shower, use a plastic, non-slip stool. Keep the floor dry. Clean up any water that spills on the floor as soon as it happens. Remove soap buildup in the tub or shower regularly. Attach bath mats securely with double-sided non-slip rug tape. Do not have throw rugs and other things on the floor that can make you trip. What can I do in the bedroom? Use night lights. Make sure that you have a light by your bed that is easy to reach. Do not use any sheets or blankets that are too big for your bed. They should not hang down onto the floor. Have a firm chair that has side arms. You can use this for support while you get dressed. Do not have throw rugs and other things on the floor that can make you trip. What can I do in the kitchen? Clean up any spills right away. Avoid walking on wet floors. Keep items that you use a lot in easy-to-reach places.  If you need to reach something above you, use a strong step stool that has a grab bar. Keep electrical cords out of the way. Do not use floor polish or wax that makes floors slippery. If you must use wax, use non-skid floor wax. Do not have throw rugs and other things on the floor that can make you trip. What can I do with my stairs? Do not leave any items on the stairs. Make sure that there are handrails on both sides of the stairs and use them. Fix handrails that are broken or loose. Make sure that handrails are as long as the stairways. Check any carpeting to make sure that it is firmly attached to the stairs. Fix any carpet that is loose or worn. Avoid having throw rugs at the top or bottom of the stairs. If you do have throw rugs, attach them to the floor with carpet tape. Make sure that you have a light switch at the top of the stairs and the bottom of the stairs. If you do not have them,  ask someone to add them for you. What else can I do to help prevent falls? Wear shoes that: Do not have high heels. Have rubber bottoms. Are comfortable and fit you well. Are closed at the toe. Do not wear sandals. If you use a stepladder: Make sure that it is fully opened. Do not climb a closed stepladder. Make sure that both sides of the stepladder are locked into place. Ask someone to hold it for you, if possible. Clearly mark and make sure that you can see: Any grab bars or handrails. First and last steps. Where the edge of each step is. Use tools that help you move around (mobility aids) if they are needed. These include: Canes. Walkers. Scooters. Crutches. Turn on the lights when you go into a dark area. Replace any light bulbs as soon as they burn out. Set up your furniture so you have a clear path. Avoid moving your furniture around. If any of your floors are uneven, fix them. If there are any pets around you, be aware of where they are. Review your medicines with your doctor. Some medicines can make you feel dizzy. This can increase your chance of falling. Ask your doctor what other things that you can do to help prevent falls. This information is not intended to replace advice given to you by your health care provider. Make sure you discuss any questions you have with your health care provider. Document Released: 08/17/2009 Document Revised: 03/28/2016 Document Reviewed: 11/25/2014 Elsevier Interactive Patient Education  2017 [employer].

## 2022-10-10 NOTE — Progress Notes (Signed)
Subjective:   Faith Reeves is a 71 y.o. female who presents for Medicare Annual (Subsequent) preventive examination.  Review of Systems     Cardiac Risk Factors include: advanced age (>57mn, >>80women);hypertension;sedentary lifestyle;obesity (BMI >30kg/m2)     Objective:    Today's Vitals   10/10/22 1158  BP: 116/72  Weight: 234 lb (106.1 kg)  Height: '4\' 11"'$  (1.499 m)   Body mass index is 47.26 kg/m.     10/10/2022   12:22 PM 03/28/2022    1:46 PM 04/27/2021    2:21 PM 09/15/2017   10:31 AM  Advanced Directives  Does Patient Have a Medical Advance Directive? No No No Yes  Type of Advance Directive    HCarter Does patient want to make changes to medical advance directive?    No - Patient declined  Copy of [place] Chart?    No - copy requested  Would patient like information on creating a medical advance directive? Yes (MAU/Ambulatory/Procedural Areas - Information given) No - Patient declined No - Patient declined     Current Medications (verified) Outpatient Encounter Medications as of 10/10/2022  Medication Sig   albuterol (PROVENTIL HFA;VENTOLIN HFA) 108 (90 Base) MCG/ACT inhaler Inhale 2 puffs into the lungs every 4 (four) hours as needed for wheezing or shortness of breath.   ALPRAZolam (XANAX) 0.5 MG tablet Take 1 tablet (0.5 mg total) by mouth 3 (three) times daily as needed for anxiety.   celecoxib (CELEBREX) 200 MG capsule TAKE 1 CAPSULE BY MOUTH EVERY DAY   gabapentin (NEURONTIN) 300 MG capsule Take 1 capsule by mouth three times a day.   ipratropium (ATROVENT) 0.06 % nasal spray SPRAY 2 SPRAYS INTO EACH NOSTRIL 4 TIMES A DAY.   levocetirizine (XYZAL) 5 MG tablet TAKE 1 TABLET BY MOUTH EVERY DAY IN THE EVENING   methocarbamol (ROBAXIN) 500 MG tablet Take 1 tablet (500 mg total) by mouth 2 (two) times daily as needed for muscle spasms.   mometasone (ELOCON) 0.1 % cream APPLY TO AFFECTED AREA EVERY DAY AS NEEDED    oxyCODONE-acetaminophen (PERCOCET) 10-325 MG tablet Take 1 tablet by mouth every 4 (four) hours as needed for pain (G89.4).   pantoprazole (PROTONIX) 40 MG tablet TAKE 1 TABLET BY MOUTH EVERY DAY   predniSONE (STERAPRED UNI-PAK 21 TAB) 10 MG (21) TBPK tablet Take as directed   sertraline (ZOLOFT) 50 MG tablet TAKE 1 AND 1/2 TABLETS DAILY BY MOUTH   telmisartan-hydrochlorothiazide (MICARDIS HCT) 80-12.5 MG tablet Take 1 tablet by mouth daily.   valACYclovir (VALTREX) 1000 MG tablet Take 2 tablets (2,000 mg total) by mouth 2 (two) times daily.   zolpidem (AMBIEN) 10 MG tablet TAKE 1 TABLET BY MOUTH AT BEDTIME AS NEEDED FOR SLEEP   No facility-administered encounter medications on file as of 10/10/2022.    Allergies (verified) Cymbalta [duloxetine hcl]   History: Past Medical History:  Diagnosis Date   Allergy    Anemia    Anxiety    Blood transfusion without reported diagnosis    Cataract    GERD (gastroesophageal reflux disease)    Hypertension    Hypertensive retinopathy    OU   Macular degeneration    Past Surgical History:  Procedure Laterality Date   ABDOMINAL HYSTERECTOMY     ACHILLES TENDON REPAIR Bilateral    APPENDECTOMY     BACK SURGERY     BILATERAL CARPAL TUNNEL RELEASE     CHOLECYSTECTOMY  COLONOSCOPY     EYE SURGERY Right 09/18/2022   cataract extraction   HERNIA REPAIR     JOINT REPLACEMENT     MELANOMA EXCISION Right 09/15/2017   Procedure: EXCISION LIPOSARCOMA RIGHT ARM;  Surgeon: Georganna Skeans, MD;  Location: [facility];  Service: General;  Laterality: Right;   SPINE SURGERY     Family History  Problem Relation Age of Onset   Hypertension Mother    Cancer Sister    Stroke Maternal Grandmother    Social History   Socioeconomic History   Marital status: Married    Spouse name: Not on file   Number of children: Not on file   Years of education: Not on file   Highest education level: Not on file  Occupational History   Not on file  Tobacco Use    Smoking status: Never   Smokeless tobacco: Never  Vaping Use   Vaping Use: Never used  Substance and Sexual Activity   Alcohol use: Yes    Alcohol/week: 1.0 standard drink of alcohol    Types: 1 Glasses of wine per week   Drug use: No   Sexual activity: Never  Other Topics Concern   Not on file  Social History Narrative   Recent death of son; lives with husband; originally from [place]; most of family still lives in the [place]    Social Determinants of Health   Financial Resource Strain: [place]  (10/10/2022)   Overall Financial Resource Strain (CARDIA)    Difficulty of Paying Living Expenses: Not hard at all  Food Insecurity: No Food Insecurity (10/10/2022)   Hunger Vital Sign    Worried About Running Out of Food in the Last Year: Never true    [place] in the Last Year: Never true  Transportation Needs: No Transportation Needs (10/10/2022)   PRAPARE - Hydrologist (Medical): No    Lack of Transportation (Non-Medical): No  Physical Activity: Inactive (10/10/2022)   Exercise Vital Sign    Days of Exercise per Week: 0 days    Minutes of Exercise per Session: 0 min  Stress: No Stress Concern Present (10/10/2022)   [place]    Feeling of Stress : Not at all  Social Connections: Moderately Isolated (10/10/2022)   Social Connection and Isolation Panel [NHANES]    Frequency of Communication with Friends and Family: More than three times a week    Frequency of Social Gatherings with Friends and Family: Twice a week    Attends Religious Services: Never    Marine scientist or Organizations: No    Attends Music therapist: Never    Marital Status: Married    Tobacco Counseling Counseling given: Not Answered   Clinical Intake:  Pre-visit preparation completed: Yes  Pain : No/denies pain   BMI - recorded: 47.26 Nutritional Status: BMI > 30   Obese Nutritional Risks: None Diabetes: No  How often do you need to have someone help you when you read instructions, pamphlets, or other written materials from your doctor or pharmacy?: 1 - Never  Diabetic?No   Interpreter Needed?: No  Information entered by :: Denman George LPN   Activities of Daily Living    10/10/2022   12:22 PM  In your present state of health, do you have any difficulty performing the following activities:  Hearing? 0  Vision? 0  Difficulty concentrating or making decisions? 0  Walking or climbing stairs? 1  Comment use of assistive device  Dressing or bathing? 0  Doing errands, shopping? 0  Preparing Food and eating ? N  Using the Toilet? N  In the past six months, have you accidently leaked urine? N  Do you have problems with loss of bowel control? N  Managing your Medications? N  Managing your Finances? N  Housekeeping or managing your Housekeeping? N    Patient Care Team: Susy Frizzle, MD as PCP - General (Family Medicine)  Indicate any recent Medical Services you may have received from other than [facility] providers in the past year (date may be approximate).     Assessment:   This is a routine wellness examination for Faith Reeves.  Hearing/Vision screen Hearing Screening - Comments:: No concerns noted   Vision Screening - Comments:: Wears rx glasses - up to date with routine eye exams with Dr. Coralyn Pear    Dietary issues and exercise activities discussed: Current Exercise Habits: The patient does not participate in regular exercise at present   Goals Addressed   None    Depression Screen    10/10/2022   12:20 PM 04/27/2021    2:22 PM 10/18/2019   10:19 AM 09/08/2018    9:25 AM 01/12/2018   10:16 AM 11/21/2017   12:17 PM 08/18/2017    3:23 PM  PHQ 2/9 Scores  PHQ - 2 Score 0 2 0 0 0 2 3  PHQ- 9 Score  2     4    Fall Risk    10/10/2022   12:16 PM 04/27/2021    2:22 PM 03/12/2021   12:04 PM 09/24/2019    5:17 PM 09/08/2018    9:25  AM  Fall Risk   Falls in the past year? 0 0 0 0 0  Comment    Emmi Telephone Survey: data to providers prior to load   Number falls in past yr: 0 0 0    Injury with Fall? 0 0 0    Risk for fall due to : Impaired mobility      Follow up Falls evaluation completed;Education provided;Falls prevention discussed Falls evaluation completed;Falls prevention discussed Falls evaluation completed  Falls evaluation completed    FALL RISK PREVENTION PERTAINING TO THE HOME:  Any stairs in or around the home? Yes  If so, are there any without handrails? No  Home free of loose throw rugs in walkways, pet beds, electrical cords, etc? Yes  Adequate lighting in your home to reduce risk of falls? Yes   ASSISTIVE DEVICES UTILIZED TO PREVENT FALLS:  Life alert? No  Use of a cane, walker or w/c? Yes  Grab bars in the bathroom? Yes  Shower chair or bench in shower? Yes  Elevated toilet seat or a handicapped toilet? Yes   TIMED UP AND GO:  Was the test performed? Yes .  Length of time to ambulate 10 feet: 5 sec.   Gait steady and fast with assistive device  Cognitive Function:        10/10/2022   12:22 PM  6CIT Screen  What Year? 0 points  What month? 0 points  What time? 0 points  Count back from 20 0 points  Months in reverse 0 points  Repeat phrase 0 points  Total Score 0 points    Immunizations Immunization History  Administered Date(s) Administered   Fluad Quad(high Dose 65+) 10/18/2019, 08/01/2020   Influenza,inj,Quad PF,6+ Mos 08/06/2016, 08/18/2017, 08/31/2018   PFIZER(Purple Top)SARS-COV-2 Vaccination 09/21/2020  Pneumococcal Conjugate-13 09/08/2018   Pneumococcal Polysaccharide-23 11/05/2015    TDAP status: Due, Education has been provided regarding the importance of this vaccine. Advised may receive this vaccine at local pharmacy or Health Dept. Aware to provide a copy of the vaccination record if obtained from local pharmacy or Health Dept. Verbalized acceptance and  understanding.  Flu Vaccine status: Due, Education has been provided regarding the importance of this vaccine. Advised may receive this vaccine at local pharmacy or Health Dept. Aware to provide a copy of the vaccination record if obtained from local pharmacy or Health Dept. Verbalized acceptance and understanding.  Pneumococcal vaccine status: Up to date  Covid-19 vaccine status: Information provided on how to obtain vaccines.   Qualifies for Shingles Vaccine? Yes   Zostavax completed No   Shingrix Completed?: No.    Education has been provided regarding the importance of this vaccine. Patient has been advised to call insurance company to determine out of pocket expense if they have not yet received this vaccine. Advised may also receive vaccine at local pharmacy or Health Dept. Verbalized acceptance and understanding.  Screening Tests Health Maintenance  Topic Date Due   DTaP/Tdap/Td (1 - Tdap) Never done   Zoster Vaccines- Shingrix (1 of 2) Never done   INFLUENZA VACCINE  06/04/2022   COVID-19 Vaccine (3 - 2023-24 season) 07/05/2022   MAMMOGRAM  02/28/2023   Medicare Annual Wellness (AWV)  10/11/2023   COLONOSCOPY (Pts 45-82yr Insurance coverage will need to be confirmed)  03/08/2032   Pneumonia Vaccine 71 Years old  Completed   DEXA SCAN  Completed   Hepatitis C Screening  Completed   HPV VACCINES  Aged Out    Health Maintenance  Health Maintenance Due  Topic Date Due   DTaP/Tdap/Td (1 - Tdap) Never done   Zoster Vaccines- Shingrix (1 of 2) Never done   INFLUENZA VACCINE  06/04/2022   COVID-19 Vaccine (3 - 2023-24 season) 07/05/2022    Colorectal cancer screening: Type of screening: Cologuard. Completed 03/08/22. Repeat every 3 years  Mammogram status: Completed 02/28/11. Repeat every year  Bone Density status: Completed 11/18/18. Results reflect: Bone density results: NORMAL. Repeat every 7 years.  Lung Cancer Screening: (Low Dose CT Chest recommended if Age 338-80 years, 30 pack-year currently smoking OR have quit w/in 15years.) does not qualify.   Lung Cancer Screening Referral: n/a  Additional Screening:  Hepatitis C Screening: does qualify; Completed 08/20/17  Vision Screening: Recommended annual ophthalmology exams for early detection of glaucoma and other disorders of the eye. Is the patient up to date with their annual eye exam?  Yes  Who is the provider or what is the name of the office in which the patient attends annual eye exams? Dr. ZCoralyn Pear If pt is not established with a provider, would they like to be referred to a provider to establish care? No .   Dental Screening: Recommended annual dental exams for proper oral hygiene  Community Resource Referral / Chronic Care Management: CRR required this visit?  No   CCM required this visit?  No      Plan:     I have personally reviewed and noted the following in the patient's chart:   Medical and social history Use of alcohol, tobacco or illicit drugs  Current medications and supplements including opioid prescriptions. Patient is currently taking opioid prescriptions. Information provided to patient regarding non-opioid alternatives. Patient advised to discuss non-opioid treatment plan with their provider. Functional ability and status Nutritional status  Physical activity Advanced directives List of other physicians Hospitalizations, surgeries, and ER visits in previous 12 months Vitals Screenings to include cognitive, depression, and falls Referrals and appointments  In addition, I have reviewed and discussed with patient certain preventive protocols, quality metrics, and best practice recommendations. A written personalized care plan for preventive services as well as general preventive health recommendations were provided to patient.     Denman George [place], [place]   [id number]   Nurse Notes: No concerns

## 2022-10-16 DIAGNOSIS — Z23 Encounter for immunization: Secondary | ICD-10-CM | POA: Diagnosis not present

## 2022-10-24 NOTE — Progress Notes (Signed)
[facility] Note  10/25/2022   CHIEF COMPLAINT Patient presents for Retina Follow Up  HISTORY OF PRESENT ILLNESS: Faith Reeves is a 71 y.o. female who presents to the clinic today for:   HPI     Retina Follow Up   Patient presents with  Wet AMD.  In right eye.  This started months ago.  Duration of 4 weeks.  I, the attending physician,  performed the HPI with the patient and updated documentation appropriately.        Comments   Patient feels that the vision has improved with cataract surgery. She had IOL sx. OD with Dr. Eliseo Squires on 09/18/22. She is using AT's OU PRN.       Last edited by Bernarda Caffey, MD on 10/26/2022  1:10 AM.      Referring physician: Susy Frizzle, MD [id number] [facility] [place],  [place] [address]  HISTORICAL INFORMATION:   Selected notes from the MEDICAL RECORD NUMBER Referred by Dr. Quentin Ore for concern of exu ARMD OU   CURRENT MEDICATIONS: No current outpatient medications on file. (Ophthalmic Drugs)   No current facility-administered medications for this visit. (Ophthalmic Drugs)   Current Outpatient Medications (Other)  Medication Sig   albuterol (PROVENTIL HFA;VENTOLIN HFA) 108 (90 Base) MCG/ACT inhaler Inhale 2 puffs into the lungs every 4 (four) hours as needed for wheezing or shortness of breath.   ALPRAZolam (XANAX) 0.5 MG tablet Take 1 tablet (0.5 mg total) by mouth 3 (three) times daily as needed for anxiety.   celecoxib (CELEBREX) 200 MG capsule TAKE 1 CAPSULE BY MOUTH EVERY DAY   gabapentin (NEURONTIN) 300 MG capsule Take 1 capsule by mouth three times a day.   ipratropium (ATROVENT) 0.06 % nasal spray SPRAY 2 SPRAYS INTO EACH NOSTRIL 4 TIMES A DAY.   levocetirizine (XYZAL) 5 MG tablet TAKE 1 TABLET BY MOUTH EVERY DAY IN THE EVENING   methocarbamol (ROBAXIN) 500 MG tablet Take 1 tablet (500 mg total) by mouth 2 (two) times daily as needed for muscle spasms.   mometasone (ELOCON) 0.1 % cream APPLY TO  AFFECTED AREA EVERY DAY AS NEEDED   oxyCODONE-acetaminophen (PERCOCET) 10-325 MG tablet Take 1 tablet by mouth every 4 (four) hours as needed for pain (G89.4).   pantoprazole (PROTONIX) 40 MG tablet TAKE 1 TABLET BY MOUTH EVERY DAY   predniSONE (STERAPRED UNI-PAK 21 TAB) 10 MG (21) TBPK tablet Take as directed   sertraline (ZOLOFT) 50 MG tablet TAKE 1 AND 1/2 TABLETS DAILY BY MOUTH   telmisartan-hydrochlorothiazide (MICARDIS HCT) 80-12.5 MG tablet Take 1 tablet by mouth daily.   valACYclovir (VALTREX) 1000 MG tablet Take 2 tablets (2,000 mg total) by mouth 2 (two) times daily.   zolpidem (AMBIEN) 10 MG tablet TAKE 1 TABLET BY MOUTH AT BEDTIME AS NEEDED FOR SLEEP   No current facility-administered medications for this visit. (Other)   REVIEW OF SYSTEMS: ROS   Positive for: Gastrointestinal, Neurological, Eyes Negative for: Constitutional, Skin, Genitourinary, Musculoskeletal, HENT, Endocrine, Cardiovascular, Respiratory, Psychiatric, Allergic/Imm, Heme/Lymph Last edited by Annie Paras, COT on 10/25/2022  1:46 PM.     ALLERGIES Allergies  Allergen Reactions   Cymbalta [Duloxetine Hcl] Swelling   PAST MEDICAL HISTORY Past Medical History:  Diagnosis Date   Allergy    Anemia    Anxiety    Blood transfusion without reported diagnosis    Cataract    GERD (gastroesophageal reflux disease)    Hypertension  Hypertensive retinopathy    OU   Macular degeneration    Past Surgical History:  Procedure Laterality Date   ABDOMINAL HYSTERECTOMY     ACHILLES TENDON REPAIR Bilateral    APPENDECTOMY     BACK SURGERY     BILATERAL CARPAL TUNNEL RELEASE     CHOLECYSTECTOMY     COLONOSCOPY     EYE SURGERY Right 09/18/2022   cataract extraction   HERNIA REPAIR     JOINT REPLACEMENT     MELANOMA EXCISION Right 09/15/2017   Procedure: EXCISION LIPOSARCOMA RIGHT ARM;  Surgeon: Georganna Skeans, MD;  Location: [place];  Service: General;  Laterality: Right;   SPINE SURGERY      FAMILY HISTORY Family History  Problem Relation Age of Onset   Hypertension Mother    Cancer Sister    Stroke Maternal Grandmother    SOCIAL HISTORY Social History   Tobacco Use   Smoking status: Never   Smokeless tobacco: Never  Vaping Use   Vaping Use: Never used  Substance Use Topics   Alcohol use: Yes    Alcohol/week: 1.0 standard drink of alcohol    Types: 1 Glasses of wine per week   Drug use: No       OPHTHALMIC EXAM: Base Eye Exam     Visual Acuity (Snellen - Linear)       Right Left   Dist [place] 20/40    Dist cc  20/30   Dist ph [place] 20/30    Dist ph cc  NI         Tonometry (Tonopen, 1:49 PM)       Right Left   Pressure 15 19         Pupils       Dark Light Shape React APD   Right 3 2 Round Brisk None   Left 3 2 Round Brisk None         Visual Fields       Left Right    Full Full         Extraocular Movement       Right Left    Full, Ortho Full, Ortho         Neuro/Psych     Oriented x3: Yes   Mood/Affect: Normal         Dilation     Both eyes: 1.0% Mydriacyl, 2.5% Phenylephrine @ 1:46 PM           Slit Lamp and Fundus Exam     Slit Lamp Exam       Right Left   Lids/Lashes Dermatochalasis - upper lid, mild Meibomian gland dysfunction Dermatochalasis - upper lid, mild Meibomian gland dysfunction   Conjunctiva/Sclera White and quiet White and quiet   Cornea 2-3+ fine Punctate epithelial erosions 2+ fine Punctate epithelial erosions inferiorly   Anterior Chamber deep, clear, narrow temporal angle deep, clear, narrow temporal angle   Iris Round and dilated Round and dilated   Lens PCIOL in good position 2-3+ Nuclear sclerosis, 2-3+ Cortical cataract   Anterior Vitreous Vitreous syneresis, Posterior vitreous detachment, prominent vitreous condensation over macula Vitreous syneresis, Posterior vitreous detachment         Fundus Exam       Right Left   Disc Pink and Sharp, Compact, +PPA Pink and Sharp,  temporal PPP, Compact   C/D Ratio 0.2 0.1   Macula Blunted foveal reflex, drusen, RPE mottling, clumping and atrophy, +CNV, persistent central SRF -- slightly increased, No  heme Flat, good foveal reflex, scattered Drusen, RPE mottling and clumping, focal PEDs, No heme or edema   Vessels attenuated, Tortuous attenuated, Tortuous   Periphery Attached, mild reticular degeneration, no heme Attached, no heme           Refraction     Wearing Rx       Sphere Cylinder Axis Add   Right +2.00 +0.50 008 +2.50   Left +2.50 +1.00 154 +2.50    Type: PAL           IMAGING AND PROCEDURES  Imaging and Procedures for '@TODAY'$ @  OCT, Retina - OU - Both Eyes       Right Eye Quality was good. Central Foveal Thickness: 312. Progression has worsened. Findings include normal foveal contour, no IRF, no SRF, retinal drusen , pigment epithelial detachment, outer retinal atrophy (Mild interval increase in central SRF overlying low, stable PEDs).   Left Eye Quality was good. Central Foveal Thickness: 263. Progression has been stable. Findings include normal foveal contour, no IRF, no SRF, retinal drusen .   Notes *Images captured and stored on drive  Diagnosis / Impression:  OD: exu ARMD -- Mild interval increase in central SRF overlying low, stable PEDs OS: non-exu ARMD -- stable   Clinical management:  See below  Abbreviations: NFP - Normal foveal profile. CME - cystoid macular edema. PED - pigment epithelial detachment. IRF - intraretinal fluid. SRF - subretinal fluid. EZ - ellipsoid zone. ERM - epiretinal membrane. ORA - outer retinal atrophy. ORT - outer retinal tubulation. SRHM - subretinal hyper-reflective material      Intravitreal Injection, Pharmacologic Agent - OD - Right Eye       Time Out 10/25/2022. 2:46 PM. Confirmed correct patient, procedure, site, and patient consented.   Anesthesia Topical anesthesia was used. Anesthetic medications included Lidocaine 2%, Proparacaine  0.5%.   Procedure Preparation included 5% betadine to ocular surface, eyelid speculum. A (32g) needle was used.   Injection: 2 mg aflibercept 2 MG/0.05ML   Route: Intravitreal, Site: Right Eye   NDC: [id number], Lot: [id number], Expiration date: 01/02/2024, Waste: 0 mL   Post-op Post injection exam found visual acuity of at least counting fingers. The patient tolerated the procedure well. There were no complications. The patient received written and verbal post procedure care education. Post injection medications were not given.            ASSESSMENT/PLAN:    ICD-10-CM   1. Exudative age-related macular degeneration of right eye with active choroidal neovascularization (HCC)  H35.3211 OCT, Retina - OU - Both Eyes    Intravitreal Injection, Pharmacologic Agent - OD - Right Eye    aflibercept (EYLEA) SOLN 2 mg    2. Intermediate stage nonexudative age-related macular degeneration of left eye  H35.3122     3. Essential hypertension  I10     4. Hypertensive retinopathy of both eyes  H35.033     5. Combined forms of age-related cataract of left eye  H25.812     6. Pseudophakia  Z96.1     7. Glaucoma suspect of both eyes  H40.003     8. Dry eyes  H04.123      1. Exudative age related macular degeneration, right eye  - FA (03.10.20) shows +CNV w/ staining/leakage  - repeat FA 09.13.21 shows interval increase in perifoveal leakage OD  - s/p IVA OD #1 (03.10.20), #2 (04.13.20), #3 (05.11.20), #4 (06.23.20), #5 (8.18.20), #6 (10.27.20), #7 (01.26.21), #8 (04.26.21), #9 (08.02.21), #10 (09.13.21), #  11 (10.11.21), #12 (11.9.21), #13 (12.14.21), #14 (1.24.22), #15 (3.15.22), #16 (05.24.22), #17 (8.9.22), #18 (10.18.22), #19 (01.03.23), #20 (03.29.23) -- IVA resistance  - s/p IVE OD #1 (06.07.23), #2 (07.19.23), #3 (09.01.23), #4 (10.13.23), #5 (11.10.23)  - pt was doing very well with q3 mo maintenance injections, but then developed interval increases in central SRF over several  visits -- IVA resistance  - OCT today shows mild interval increase in central SRF overlying low, stable PEDs at 6 weeks    - discussed possible resistance to [facility] -- insurance okay for Vabysmo, will consider switch at next visit - recommend IVE OD #6 today, 12.22.23, with follow up in 6 wks  - pt wishes to be treated with IVE OD  - RBA of procedure discussed, questions answered  - IVE informed consent obtained and signed, 06.07.23 (OD)  - see procedure note  - f/u in 6 wks -- DFE/OCT/possible injection  2. Age related macular degeneration, non-exudative, left eye  - The incidence, anatomy, and pathology of dry AMD, risk of progression, and the AREDS and AREDS 2 study including smoking risks discussed with patient.  - intermediate stage  - recommend amsler grid monitoring  3,4. Hypertensive retinopathy OU  - discussed importance of tight BP control  - monitor  5. Mixed form age related cataracts OS  - The symptoms of cataract, surgical options, and treatments and risks were discussed with patient  - under the expert management of Dr. Lucianne Lei  6. Pseudophakia OD  - s/p CE/IOL (Dr. Lucianne Lei, 11.15.23)  - IOL in good position, doing well  - monitor  7. Glaucoma Suspect OU  - IOP 15,19  - under the expert management of Dr. Lucianne Lei  8. Dry eyes OU  - decreased vision OD today mostly due to increased PEE / dry eyes  - pt reports stopping ATs after hearing about product recalls on ATs - recommend artificial tears and lubricating ointment as needed   Ophthalmic Meds Ordered this visit:  Meds ordered this encounter  Medications   aflibercept (EYLEA) SOLN 2 mg   Return in about 6 weeks (around 12/06/2022) for ex ARMD OD - Dilated Exam, OCT, Possible Injxn.  There are no Patient Instructions on file for this visit.  This document serves as a record of services personally performed by Gardiner Sleeper, MD, PhD. It was created on their behalf by San Jetty. Owens Shark, OA an ophthalmic technician. The  creation of this record is the provider's dictation and/or activities during the visit.    Electronically signed by: San Jetty. Owens Shark, [place] 12.21.2023 1:34 AM   Gardiner Sleeper, M.D., Ph.D. Diseases & Surgery of the Retina and Vitreous [facility]  I have reviewed the above documentation for accuracy and completeness, and I agree with the above. Gardiner Sleeper, M.D., Ph.D. 10/26/22 1:34 AM  Abbreviations: M myopia (nearsighted); A astigmatism; H hyperopia (farsighted); P presbyopia; Mrx spectacle prescription;  CTL contact lenses; OD right eye; OS left eye; OU both eyes  XT exotropia; ET esotropia; PEK punctate epithelial keratitis; PEE punctate epithelial erosions; DES dry eye syndrome; MGD meibomian gland dysfunction; ATs artificial tears; PFAT's preservative free artificial tears; Mullins nuclear sclerotic cataract; PSC posterior subcapsular cataract; ERM epi-retinal membrane; PVD posterior vitreous detachment; RD retinal detachment; DM diabetes mellitus; DR diabetic retinopathy; NPDR non-proliferative diabetic retinopathy; PDR proliferative diabetic retinopathy; CSME clinically significant macular edema; DME diabetic macular edema; dbh dot blot hemorrhages; CWS cotton wool spot; POAG primary open angle glaucoma;  C/D cup-to-disc ratio; HVF humphrey visual field; GVF goldmann visual field; OCT optical coherence tomography; IOP intraocular pressure; BRVO Branch retinal vein occlusion; CRVO central retinal vein occlusion; CRAO central retinal artery occlusion; BRAO branch retinal artery occlusion; RT retinal tear; SB scleral buckle; PPV pars plana vitrectomy; VH Vitreous hemorrhage; PRP panretinal laser photocoagulation; IVK intravitreal kenalog; VMT vitreomacular traction; MH Macular hole;  NVD neovascularization of the disc; NVE neovascularization elsewhere; AREDS age related eye disease study; ARMD age related macular degeneration; POAG primary open angle glaucoma; EBMD  epithelial/anterior basement membrane dystrophy; ACIOL anterior chamber intraocular lens; IOL intraocular lens; PCIOL posterior chamber intraocular lens; Phaco/IOL phacoemulsification with intraocular lens placement; [place] photorefractive keratectomy; LASIK laser assisted in situ keratomileusis; HTN hypertension; DM diabetes mellitus; COPD chronic obstructive pulmonary disease

## 2022-10-25 ENCOUNTER — Encounter (INDEPENDENT_AMBULATORY_CARE_PROVIDER_SITE_OTHER): Payer: Self-pay | Admitting: Ophthalmology

## 2022-10-25 ENCOUNTER — Ambulatory Visit (INDEPENDENT_AMBULATORY_CARE_PROVIDER_SITE_OTHER): Payer: Medicare Other | Admitting: Ophthalmology

## 2022-10-25 DIAGNOSIS — H353211 Exudative age-related macular degeneration, right eye, with active choroidal neovascularization: Secondary | ICD-10-CM

## 2022-10-25 DIAGNOSIS — H353122 Nonexudative age-related macular degeneration, left eye, intermediate dry stage: Secondary | ICD-10-CM

## 2022-10-25 DIAGNOSIS — H35033 Hypertensive retinopathy, bilateral: Secondary | ICD-10-CM | POA: Diagnosis not present

## 2022-10-25 DIAGNOSIS — H25812 Combined forms of age-related cataract, left eye: Secondary | ICD-10-CM | POA: Diagnosis not present

## 2022-10-25 DIAGNOSIS — H04123 Dry eye syndrome of bilateral lacrimal glands: Secondary | ICD-10-CM | POA: Diagnosis not present

## 2022-10-25 DIAGNOSIS — H40003 Preglaucoma, unspecified, bilateral: Secondary | ICD-10-CM

## 2022-10-25 DIAGNOSIS — Z961 Presence of intraocular lens: Secondary | ICD-10-CM

## 2022-10-25 DIAGNOSIS — H25813 Combined forms of age-related cataract, bilateral: Secondary | ICD-10-CM

## 2022-10-25 DIAGNOSIS — I10 Essential (primary) hypertension: Secondary | ICD-10-CM

## 2022-10-25 MED ORDER — AFLIBERCEPT 2MG/0.05ML IZ SOLN FOR KALEIDOSCOPE
2.0000 mg | INTRAVITREAL | Status: AC | PRN
  Administered 2022-10-25: 2 mg via INTRAVITREAL

## 2022-10-30 ENCOUNTER — Other Ambulatory Visit: Payer: Self-pay | Admitting: Family Medicine

## 2022-10-31 ENCOUNTER — Other Ambulatory Visit: Payer: Self-pay

## 2022-10-31 DIAGNOSIS — L299 Pruritus, unspecified: Secondary | ICD-10-CM

## 2022-10-31 MED ORDER — MOMETASONE FUROATE 0.1 % EX CREA: TOPICAL_CREAM | CUTANEOUS | 0 refills | Status: DC

## 2022-11-18 ENCOUNTER — Telehealth: Payer: Self-pay | Admitting: Family Medicine

## 2022-11-18 NOTE — Telephone Encounter (Signed)
Prescription Request  11/18/2022  Is this a "Controlled Substance" medicine? Yes  LOV: Visit date not found  What is the name of the medication or equipment?   oxyCODONE-acetaminophen (PERCOCET) 10-325 MG tablet   Have you contacted your pharmacy to request a refill? Yes   Which pharmacy would you like this sent to?  [id number]Phone: [phone]: [phone]   Patient notified that their request is being sent to the clinical staff for review and that they should receive a response within 2 business days.   Please advise patient's spouse MLegrand Comoat [phone]

## 2022-11-19 ENCOUNTER — Other Ambulatory Visit: Payer: Self-pay | Admitting: Family Medicine

## 2022-11-19 MED ORDER — OXYCODONE-ACETAMINOPHEN 10-325 MG PO TABS: 1.0000 | ORAL_TABLET | ORAL | 0 refills | Status: DC | PRN

## 2022-11-21 ENCOUNTER — Other Ambulatory Visit: Payer: Self-pay | Admitting: Family Medicine

## 2022-11-21 DIAGNOSIS — L299 Pruritus, unspecified: Secondary | ICD-10-CM

## 2022-11-21 NOTE — Telephone Encounter (Signed)
Requested medications are due for refill today.  Unsure  Requested medications are on the active medications list.  yes  Last refill. 10/31/2022 45g 0 rf  Future visit scheduled.   no  Notes to clinic.  Refill not delegated.    Requested Prescriptions  Pending Prescriptions Disp Refills   mometasone (ELOCON) 0.1 % cream [Pharmacy Med Name: MOMETASONE FUROATE 0.1% CREAM] 45 g 0    Sig: APPLY TO AFFECTED AREA EVERY DAY AS NEEDED     Off-Protocol Failed - 11/21/2022  5:12 PM      Failed - Medication not assigned to a protocol, review manually.      Passed - Valid encounter within last 12 months    Recent Outpatient Visits           9 months ago Benign essential HTN   [place] Pickard, Cammie Mcgee, MD   1 year ago Benign essential HTN   [place] Dennard Schaumann Cammie Mcgee, MD   2 years ago Benign essential HTN   [place] Susy Frizzle, MD   3 years ago Acute idiopathic gout of right wrist   [place] Pickard, Cammie Mcgee, MD   3 years ago General medical exam   [place] Pickard, Cammie Mcgee, MD

## 2022-11-22 NOTE — Telephone Encounter (Signed)
Prescription Request  11/22/2022  Is this a "Controlled Substance" medicine? Yes  LOV: 10/10/22  What is the name of the medication or equipment? gabapentin (NEURONTIN) 300 MG capsule [[id number]]  Have you contacted your pharmacy to request a refill? Yes   Which pharmacy would you like this sent to?  [id number]Phone: [phone]: [phone]   Patient notified that their request is being sent to the clinical staff for review and that they should receive a response within 2 business days.   Please advise at [phone]

## 2022-11-22 NOTE — Telephone Encounter (Signed)
Prescription Request  11/22/2022  Is this a "Controlled Substance" medicine? No  LOV: 10/10/22  What is the name of the medication or equipment? telmisartan-hydrochlorothiazide (MICARDIS HCT) 80-12.5 MG tablet [[id number]]  Have you contacted your pharmacy to request a refill? Yes   Which pharmacy would you like this sent to?  [id number] [address]Phone: [phone]: [phone]   Patient notified that their request is being sent to the clinical staff for review and that they should receive a response within 2 business days.   Please advise at [phone]

## 2022-11-22 NOTE — Telephone Encounter (Signed)
Requested medication (s) are due for refill today:   Yes  Requested medication (s) are on the active medication list:   Yes  Future visit scheduled:   No   Last ordered: 10/31/2022 45g, 0 refills  Returned because no protocol assigned to this medication.   Requested Prescriptions  Pending Prescriptions Disp Refills   mometasone (ELOCON) 0.1 % cream [Pharmacy Med Name: MOMETASONE FUROATE 0.1% CREAM] 45 g 0    Sig: APPLY TO AFFECTED AREA EVERY DAY AS NEEDED     Off-Protocol Failed - 11/22/2022 10:39 AM      Failed - Medication not assigned to a protocol, review manually.      Passed - Valid encounter within last 12 months    Recent Outpatient Visits           9 months ago Benign essential HTN   [place] Pickard, Cammie Mcgee, MD   1 year ago Benign essential HTN   [place] Dennard Schaumann Cammie Mcgee, MD   2 years ago Benign essential HTN   [place] Susy Frizzle, MD   3 years ago Acute idiopathic gout of right wrist   [place] Pickard, Cammie Mcgee, MD   3 years ago General medical exam   [place] Pickard, Cammie Mcgee, MD

## 2022-11-22 NOTE — Telephone Encounter (Signed)
Requested medication (s) are due for refill today:   Yes and No  Requested medication (s) are on the active medication list:   Yes for all 3  Future visit scheduled:   No   LOV 10/10/2022   Last ordered: Elocon 0.1% cream 10/31/2022 45 g, 0 refills;   Gabapentin 07/15/2022 #90, 1 refill (takes 3 times a day);   Micardis-HCT 08/05/2022 #90, 0 refills Returned because no protocol information assigned.      Requested Prescriptions  Pending Prescriptions Disp Refills   mometasone (ELOCON) 0.1 % cream [Pharmacy Med Name: MOMETASONE FUROATE 0.1% CREAM] 45 g 0    Sig: APPLY TO AFFECTED AREA EVERY DAY AS NEEDED     Off-Protocol Failed - 11/22/2022 10:47 AM      Failed - Medication not assigned to a protocol, review manually.      Passed - Valid encounter within last 12 months    Recent Outpatient Visits           9 months ago Benign essential HTN   [place] Pickard, Cammie Mcgee, MD   1 year ago Benign essential HTN   [place] Dennard Schaumann Cammie Mcgee, MD   2 years ago Benign essential HTN   [place] Susy Frizzle, MD   3 years ago Acute idiopathic gout of right wrist   [place] Pickard, Cammie Mcgee, MD   3 years ago General medical exam   [place] Pickard, Cammie Mcgee, MD

## 2022-12-04 ENCOUNTER — Telehealth: Payer: Self-pay | Admitting: Family Medicine

## 2022-12-04 ENCOUNTER — Other Ambulatory Visit: Payer: Self-pay | Admitting: Family Medicine

## 2022-12-04 DIAGNOSIS — M4120 Other idiopathic scoliosis, site unspecified: Secondary | ICD-10-CM

## 2022-12-04 DIAGNOSIS — I10 Essential (primary) hypertension: Secondary | ICD-10-CM

## 2022-12-04 NOTE — Telephone Encounter (Signed)
Duplicate request today.

## 2022-12-04 NOTE — Telephone Encounter (Signed)
Prescription Request  12/04/2022  Is this a "Controlled Substance" medicine? No  LOV: 02/19/2022  What is the name of the medication or equipment? gabapentin (NEURONTIN) 300 MG capsule   Have you contacted your pharmacy to request a refill? Yes   Which pharmacy would you like this sent to?  [id number] [address]Phone: [phone]: [phone]   Patient notified that their request is being sent to the clinical staff for review and that they should receive a response within 2 business days.   Please advise at [phone]

## 2022-12-04 NOTE — Telephone Encounter (Signed)
Requested medication (s) are due for refill today:   Yes for both  Requested medication (s) are on the active medication list:   Yes for both  Future visit scheduled:   No   LOV 02/19/2022   Last ordered: Gabapentin 07/15/2022 #90, 1 refill;   Micardis HCT 08/05/2022 #90, 0 refills  Returned because labs are due.  There is a note for her to come in for a BP check.    Requested Prescriptions  Pending Prescriptions Disp Refills   gabapentin (NEURONTIN) 300 MG capsule 90 capsule 1    Sig: Take 1 capsule by mouth three times a day.     Neurology: Anticonvulsants - gabapentin Failed - 12/04/2022 10:24 AM      Failed - Cr in normal range and within 360 days    Creat  Date Value Ref Range Status  02/19/2022 1.10 (H) 0.60 - 1.00 mg/dL Final         Passed - Completed PHQ-2 or PHQ-9 in the last 360 days      Passed - Valid encounter within last 12 months    Recent Outpatient Visits           9 months ago Benign essential HTN   [place] Pickard, Cammie Mcgee, MD   1 year ago Benign essential HTN   [place] Susy Frizzle, MD   2 years ago Benign essential HTN   [place] Susy Frizzle, MD   3 years ago Acute idiopathic gout of right wrist   [place] Pickard, Cammie Mcgee, MD   3 years ago General medical exam   [place] Pickard, Cammie Mcgee, MD               telmisartan-hydrochlorothiazide (MICARDIS HCT) 80-12.5 MG tablet 90 tablet 0    Sig: Take 1 tablet by mouth daily.     Cardiovascular: ARB + Diuretic Combos Failed - 12/04/2022 10:24 AM      Failed - K in normal range and within 180 days    Potassium  Date Value Ref Range Status  02/19/2022 4.0 3.5 - 5.3 mmol/L Final         Failed - Na in normal range and within 180 days    Sodium  Date Value Ref Range Status  02/19/2022 143 135 - 146 mmol/L Final         Failed - Cr in normal range and within 180 days    Creat  Date  Value Ref Range Status  02/19/2022 1.10 (H) 0.60 - 1.00 mg/dL Final         Failed - eGFR is 10 or above and within 180 days    GFR, Est African American  Date Value Ref Range Status  03/12/2021 55 (L) > OR = 60 mL/min/1.18m Final   GFR, Est Non African American  Date Value Ref Range Status  03/12/2021 47 (L) > OR = 60 mL/min/1.763mFinal   eGFR  Date Value Ref Range Status  02/19/2022 53 (L) > OR = 60 mL/min/1.7310minal    Comment:    The eGFR is based on the CKD-EPI 2021 equation. To calculate  the new eGFR from a previous Creatinine or Cystatin C result, go to https://www.kidney.org/professionals/ kdoqi/gfr%5Fcalculator          Failed - Valid encounter within last 6 months    Recent Outpatient Visits  9 months ago Benign essential HTN   [place] Pickard, Cammie Mcgee, MD   1 year ago Benign essential HTN   [place] Dennard Schaumann Cammie Mcgee, MD   2 years ago Benign essential HTN   [place] Dennard Schaumann Cammie Mcgee, MD   3 years ago Acute idiopathic gout of right wrist   [place] Dennard Schaumann, Cammie Mcgee, MD   3 years ago General medical exam   [place], Warren T, MD              Passed - Patient is not pregnant      Passed - Last BP in normal range    BP Readings from Last 1 Encounters:  10/10/22 116/72

## 2022-12-04 NOTE — Telephone Encounter (Signed)
Prescription Request  12/04/2022  Is this a "Controlled Substance" medicine? No  LOV: 02/19/2022  What is the name of the medication or equipment? gabapentin (NEURONTIN) 300 MG capsule   Have you contacted your pharmacy to request a refill? Yes   Which pharmacy would you like this sent to?  [id number]Phone: [phone]: [phone]   Patient notified that their request is being sent to the clinical staff for review and that they should receive a response within 2 business days.   Please advise at HPort [phone]

## 2022-12-04 NOTE — Telephone Encounter (Signed)
Prescription Request  12/04/2022  Is this a "Controlled Substance" medicine? No  LOV: 02/19/2022  What is the name of the medication or equipment? Telmisartan hctz 80-12.5 mg tb  Have you contacted your pharmacy to request a refill? Yes   Which pharmacy would you like this sent to?  [id number]: [phone]: [phone]   Patient notified that their request is being sent to the clinical staff for review and that they should receive a response within 2 business days.   Please advise at [phone]

## 2022-12-05 ENCOUNTER — Other Ambulatory Visit: Payer: Self-pay | Admitting: Family Medicine

## 2022-12-05 ENCOUNTER — Telehealth: Payer: Self-pay

## 2022-12-05 DIAGNOSIS — L299 Pruritus, unspecified: Secondary | ICD-10-CM

## 2022-12-05 DIAGNOSIS — M4120 Other idiopathic scoliosis, site unspecified: Secondary | ICD-10-CM

## 2022-12-05 DIAGNOSIS — I10 Essential (primary) hypertension: Secondary | ICD-10-CM

## 2022-12-05 MED ORDER — GABAPENTIN 300 MG PO CAPS: ORAL_CAPSULE | ORAL | 1 refills | Status: DC

## 2022-12-05 NOTE — Telephone Encounter (Signed)
Requested medication (s) are due for refill today: no  Requested medication (s) are on the active medication list: yes  Last refill:  11/22/22 45 grams  Future visit scheduled: no  Notes to clinic:  med not assigned to a protocol   Requested Prescriptions  Pending Prescriptions Disp Refills   mometasone (ELOCON) 0.1 % cream 45 g 0     Off-Protocol Failed - 12/05/2022  9:21 AM      Failed - Medication not assigned to a protocol, review manually.      Passed - Valid encounter within last 12 months    Recent Outpatient Visits           9 months ago Benign essential HTN   [place] Pick[place], Cammie Mcgee, MD   1 year ago Benign essential HTN   [place] Dennard Schaumann Cammie Mcgee, MD   2 years ago Benign essential HTN   [place] Susy Frizzle, MD   3 years ago Acute idiopathic gout of right wrist   [place], Cammie Mcgee, MD   3 years ago General medical exam   [place] Pickard, Cammie Mcgee, MD

## 2022-12-05 NOTE — Telephone Encounter (Signed)
Prescription Request  12/05/2022  Is this a "Controlled Substance" medicine? No  LOV: 02/19/2022  What is the name of the medication or equipment?   mometasone (ELOCON) 0.1 % cream   Have you contacted your pharmacy to request a refill? Yes   Which pharmacy would you like this sent to?  [id number]Phone: [phone]: [phone]   Patient notified that their request is being sent to the clinical staff for review and that they should receive a response within 2 business days.   Please advise pharmacist at [phone]

## 2022-12-05 NOTE — Progress Notes (Incomplete)
[facility] Note  12/06/2022   CHIEF COMPLAINT Patient presents for No chief complaint on file.  HISTORY OF PRESENT ILLNESS: Faith Reeves is a 72 y.o. female who presents to the clinic today for:      Referring physician: Susy Frizzle, MD [address] [facility] [place],  [place] [address]  HISTORICAL INFORMATION:   Selected notes from the MEDICAL RECORD NUMBER Referred by Dr. Quentin Ore for concern of exu ARMD OU   CURRENT MEDICATIONS: No current outpatient medications on file. (Ophthalmic Drugs)   No current facility-administered medications for this visit. (Ophthalmic Drugs)   Current Outpatient Medications (Other)  Medication Sig   albuterol (PROVENTIL HFA;VENTOLIN HFA) 108 (90 Base) MCG/ACT inhaler Inhale 2 puffs into the lungs every 4 (four) hours as needed for wheezing or shortness of breath.   ALPRAZolam (XANAX) 0.5 MG tablet Take 1 tablet (0.5 mg total) by mouth 3 (three) times daily as needed for anxiety.   celecoxib (CELEBREX) 200 MG capsule TAKE 1 CAPSULE BY MOUTH EVERY DAY   gabapentin (NEURONTIN) 300 MG capsule Take 1 capsule by mouth three times a day.   ipratropium (ATROVENT) 0.06 % nasal spray SPRAY 2 SPRAYS INTO EACH NOSTRIL 4 TIMES A DAY.   levocetirizine (XYZAL) 5 MG tablet TAKE 1 TABLET BY MOUTH EVERY DAY IN THE EVENING   methocarbamol (ROBAXIN) 500 MG tablet Take 1 tablet (500 mg total) by mouth 2 (two) times daily as needed for muscle spasms.   mometasone (ELOCON) 0.1 % cream APPLY TO AFFECTED AREA EVERY DAY AS NEEDED   oxyCODONE-acetaminophen (PERCOCET) 10-325 MG tablet Take 1 tablet by mouth every 4 (four) hours as needed for pain (G89.4).   pantoprazole (PROTONIX) 40 MG tablet TAKE 1 TABLET BY MOUTH EVERY DAY   predniSONE (STERAPRED UNI-PAK 21 TAB) 10 MG (21) TBPK tablet Take as directed   sertraline (ZOLOFT) 50 MG tablet TAKE 1 AND 1/2 TABLETS DAILY BY MOUTH   telmisartan-hydrochlorothiazide (MICARDIS HCT) 80-12.5 MG  tablet Take 1 tablet by mouth daily.   valACYclovir (VALTREX) 1000 MG tablet Take 2 tablets (2,000 mg total) by mouth 2 (two) times daily.   zolpidem (AMBIEN) 10 MG tablet TAKE 1 TABLET BY MOUTH EVERY DAY AT BEDTIME AS NEEDED FOR SLEEP   No current facility-administered medications for this visit. (Other)   REVIEW OF SYSTEMS:   ALLERGIES Allergies  Allergen Reactions   Cymbalta [Duloxetine Hcl] Swelling   PAST MEDICAL HISTORY Past Medical History:  Diagnosis Date   Allergy    Anemia    Anxiety    Blood transfusion without reported diagnosis    Cataract    GERD (gastroesophageal reflux disease)    Hypertension    Hypertensive retinopathy    OU   Macular degeneration    Past Surgical History:  Procedure Laterality Date   ABDOMINAL HYSTERECTOMY     ACHILLES TENDON REPAIR Bilateral    APPENDECTOMY     BACK SURGERY     BILATERAL CARPAL TUNNEL RELEASE     CHOLECYSTECTOMY     COLONOSCOPY     EYE SURGERY Right 09/18/2022   cataract extraction   HERNIA REPAIR     JOINT REPLACEMENT     MELANOMA EXCISION Right 09/15/2017   Procedure: EXCISION LIPOSARCOMA RIGHT ARM;  Surgeon: Georganna Skeans, MD;  Location: [facility];  Service: General;  Laterality: Right;   SPINE SURGERY     FAMILY HISTORY Family History  Problem Relation Age of Onset  Hypertension Mother    Cancer Sister    Stroke Maternal Grandmother    SOCIAL HISTORY Social History   Tobacco Use   Smoking status: Never   Smokeless tobacco: Never  Vaping Use   Vaping Use: Never used  Substance Use Topics   Alcohol use: Yes    Alcohol/week: 1.0 standard drink of alcohol    Types: 1 Glasses of wine per week   Drug use: No       OPHTHALMIC EXAM: Not recorded    IMAGING AND PROCEDURES  Imaging and Procedures for '@TODAY'$ @          ASSESSMENT/PLAN:  No diagnosis found.  1. Exudative age related macular degeneration, right eye  - FA (03.10.20) shows +CNV w/ staining/leakage  - repeat FA 09.13.21 shows  interval increase in perifoveal leakage OD  - s/p IVA OD #1 (03.10.20), #2 (04.13.20), #3 (05.11.20), #4 (06.23.20), #5 (8.18.20), #6 (10.27.20), #7 (01.26.21), #8 (04.26.21), #9 (08.02.21), #10 (09.13.21), #11 (10.11.21), #12 (11.9.21), #13 (12.14.21), #14 (1.24.22), #15 (3.15.22), #16 (05.24.22), #17 (8.9.22), #18 (10.18.22), #19 (01.03.23), #20 (03.29.23) -- IVA resistance  - s/p IVE OD #1 (06.07.23), #2 (07.19.23), #3 (09.01.23), #4 (10.13.23), #5 (11.10.23) #6(12.22.23)  - pt was doing very well with q3 mo maintenance injections, but then developed interval increases in central SRF over several visits -- IVA resistance  - OCT today shows mild interval increase in central SRF overlying low, stable PEDs at 6 weeks    - discussed possible resistance to [facility] -- insurance okay for Vabysmo, will consider switch at next visit - recommend IVE OD #7 today, 02.02.24, with follow up in 6 wks  - pt wishes to be treated with IVE OD  - RBA of procedure discussed, questions answered  - IVE informed consent obtained and signed, 06.07.23 (OD)  - see procedure note  - f/u in 6 wks -- DFE/OCT/possible injection  2. Age related macular degeneration, non-exudative, left eye  - The incidence, anatomy, and pathology of dry AMD, risk of progression, and the AREDS and AREDS 2 study including smoking risks discussed with patient.  - intermediate stage  - recommend amsler grid monitoring  3,4. Hypertensive retinopathy OU  - discussed importance of tight BP control  - monitor  5. Mixed form age related cataracts OS  - The symptoms of cataract, surgical options, and treatments and risks were discussed with patient  - under the expert management of Dr. Lucianne Lei  6. Pseudophakia OD  - s/p CE/IOL (Dr. Lucianne Lei, 11.15.23)  - IOL in good position, doing well  - monitor  7. Glaucoma Suspect OU  - IOP 15,19  - under the expert management of Dr. Lucianne Lei  8. Dry eyes OU  - decreased vision OD today mostly due to increased PEE  / dry eyes  - pt reports stopping ATs after hearing about product recalls on ATs - recommend artificial tears and lubricating ointment as needed   Ophthalmic Meds Ordered this visit:  This document serves as a record of services personally performed by Gardiner Sleeper, MD, PhD. It was created on their behalf by Joetta Manners COT, an ophthalmic technician. The creation of this record is the provider's dictation and/or activities during the visit.    Electronically signed by: Joetta Manners COT 02/01/202410:37 AM   Abbreviations: M myopia (nearsighted); A astigmatism; H hyperopia (farsighted); P presbyopia; Mrx spectacle prescription;  CTL contact lenses; OD right eye; OS left eye; OU both eyes  XT exotropia; ET esotropia; PEK punctate epithelial  keratitis; PEE punctate epithelial erosions; DES dry eye syndrome; MGD meibomian gland dysfunction; ATs artificial tears; PFAT's preservative free artificial tears; [place] nuclear sclerotic cataract; PSC posterior subcapsular cataract; ERM epi-retinal membrane; PVD posterior vitreous detachment; RD retinal detachment; DM diabetes mellitus; DR diabetic retinopathy; NPDR non-proliferative diabetic retinopathy; PDR proliferative diabetic retinopathy; CSME clinically significant macular edema; DME diabetic macular edema; dbh dot blot hemorrhages; CWS cotton wool spot; POAG primary open angle glaucoma; C/D cup-to-disc ratio; HVF humphrey visual field; GVF goldmann visual field; OCT optical coherence tomography; IOP intraocular pressure; BRVO Branch retinal vein occlusion; CRVO central retinal vein occlusion; CRAO central retinal artery occlusion; BRAO branch retinal artery occlusion; RT retinal tear; SB scleral buckle; PPV pars plana vitrectomy; VH Vitreous hemorrhage; PRP panretinal laser photocoagulation; IVK intravitreal kenalog; VMT vitreomacular traction; MH Macular hole;  NVD neovascularization of the disc; NVE neovascularization elsewhere; AREDS age  related eye disease study; ARMD age related macular degeneration; POAG primary open angle glaucoma; EBMD epithelial/anterior basement membrane dystrophy; ACIOL anterior chamber intraocular lens; IOL intraocular lens; PCIOL posterior chamber intraocular lens; Phaco/IOL phacoemulsification with intraocular lens placement; [place] photorefractive keratectomy; LASIK laser assisted in situ keratomileusis; HTN hypertension; DM diabetes mellitus; COPD chronic obstructive pulmonary disease

## 2022-12-05 NOTE — Telephone Encounter (Signed)
Prescription Request  12/05/2022  Is this a "Controlled Substance" medicine? Yes  LOV: 02/19/2022  What is the name of the medication or equipment? telmisartan-hydrochlorothiazide (MICARDIS HCT) 80-12.5 MG tablet    Have you contacted your pharmacy to request a refill? Yes   Which pharmacy would you like this sent to?  [id number]: [phone]: [phone]   Patient notified that their request is being sent to the clinical staff for review and that they should receive a response within 2 business days.   Please advise at [phone]

## 2022-12-05 NOTE — Telephone Encounter (Signed)
Refill requested for Gabapentin 300 mg. Last RF was 07/15/2022. Last OV was 02/16/2022.  [facility].

## 2022-12-06 ENCOUNTER — Encounter (INDEPENDENT_AMBULATORY_CARE_PROVIDER_SITE_OTHER): Payer: Medicare Other | Admitting: Ophthalmology

## 2022-12-06 ENCOUNTER — Encounter (INDEPENDENT_AMBULATORY_CARE_PROVIDER_SITE_OTHER): Payer: Self-pay | Admitting: Ophthalmology

## 2022-12-06 ENCOUNTER — Ambulatory Visit (INDEPENDENT_AMBULATORY_CARE_PROVIDER_SITE_OTHER): Payer: Medicare Other | Admitting: Ophthalmology

## 2022-12-06 DIAGNOSIS — H353211 Exudative age-related macular degeneration, right eye, with active choroidal neovascularization: Secondary | ICD-10-CM | POA: Diagnosis not present

## 2022-12-06 DIAGNOSIS — H35033 Hypertensive retinopathy, bilateral: Secondary | ICD-10-CM | POA: Diagnosis not present

## 2022-12-06 DIAGNOSIS — I10 Essential (primary) hypertension: Secondary | ICD-10-CM | POA: Diagnosis not present

## 2022-12-06 DIAGNOSIS — Z961 Presence of intraocular lens: Secondary | ICD-10-CM

## 2022-12-06 DIAGNOSIS — H04123 Dry eye syndrome of bilateral lacrimal glands: Secondary | ICD-10-CM

## 2022-12-06 DIAGNOSIS — H353122 Nonexudative age-related macular degeneration, left eye, intermediate dry stage: Secondary | ICD-10-CM | POA: Diagnosis not present

## 2022-12-06 DIAGNOSIS — H25812 Combined forms of age-related cataract, left eye: Secondary | ICD-10-CM

## 2022-12-06 DIAGNOSIS — H40003 Preglaucoma, unspecified, bilateral: Secondary | ICD-10-CM

## 2022-12-06 DIAGNOSIS — H25813 Combined forms of age-related cataract, bilateral: Secondary | ICD-10-CM

## 2022-12-06 MED ORDER — FARICIMAB-SVOA 6 MG/0.05ML IZ SOLN
6.0000 mg | INTRAVITREAL | Status: AC | PRN
  Administered 2022-12-06: 6 mg via INTRAVITREAL

## 2022-12-06 MED ORDER — TELMISARTAN-HCTZ 80-12.5 MG PO TABS: 1.0000 | ORAL_TABLET | Freq: Every day | ORAL | 0 refills | Status: DC

## 2022-12-06 NOTE — Progress Notes (Signed)
[facility] Note  12/06/2022   CHIEF COMPLAINT Patient presents for Retina Follow Up  HISTORY OF PRESENT ILLNESS: Faith Reeves is a 72 y.o. female who presents to the clinic today for:   HPI     Retina Follow Up   Patient presents with  Wet AMD.  In right eye.  This started 6 weeks ago.  Duration of 6 weeks.  Since onset it is stable.  I, the attending physician,  performed the HPI with the patient and updated documentation appropriately.        Comments   6 week retina follow up ARMD and I'VE OD PT is reporting vision seems stable she has noticed fog in the upper right eye she denies any flashes or floaters       Last edited by Bernarda Caffey, MD on 12/06/2022 12:10 PM.    Pt states her vision seems good except she feels like she has a "shadow" in the upper temporal corner of her vision all the time  Referring physician: Susy Frizzle, MD [address] [place],  [place] [address]  HISTORICAL INFORMATION:   Selected notes from the MEDICAL RECORD NUMBER Referred by Dr. Quentin Ore for concern of exu ARMD OU   CURRENT MEDICATIONS: No current outpatient medications on file. (Ophthalmic Drugs)   No current facility-administered medications for this visit. (Ophthalmic Drugs)   Current Outpatient Medications (Other)  Medication Sig   albuterol (PROVENTIL HFA;VENTOLIN HFA) 108 (90 Base) MCG/ACT inhaler Inhale 2 puffs into the lungs every 4 (four) hours as needed for wheezing or shortness of breath.   ALPRAZolam (XANAX) 0.5 MG tablet Take 1 tablet (0.5 mg total) by mouth 3 (three) times daily as needed for anxiety.   celecoxib (CELEBREX) 200 MG capsule TAKE 1 CAPSULE BY MOUTH EVERY DAY   gabapentin (NEURONTIN) 300 MG capsule Take 1 capsule by mouth three times a day.   ipratropium (ATROVENT) 0.06 % nasal spray SPRAY 2 SPRAYS INTO EACH NOSTRIL 4 TIMES A DAY.   levocetirizine (XYZAL) 5 MG tablet TAKE 1 TABLET BY MOUTH EVERY DAY IN THE EVENING    methocarbamol (ROBAXIN) 500 MG tablet Take 1 tablet (500 mg total) by mouth 2 (two) times daily as needed for muscle spasms.   mometasone (ELOCON) 0.1 % cream APPLY TO AFFECTED AREA EVERY DAY AS NEEDED   oxyCODONE-acetaminophen (PERCOCET) 10-325 MG tablet Take 1 tablet by mouth every 4 (four) hours as needed for pain (G89.4).   pantoprazole (PROTONIX) 40 MG tablet TAKE 1 TABLET BY MOUTH EVERY DAY   predniSONE (STERAPRED UNI-PAK 21 TAB) 10 MG (21) TBPK tablet Take as directed   sertraline (ZOLOFT) 50 MG tablet TAKE 1 AND 1/2 TABLETS DAILY BY MOUTH   telmisartan-hydrochlorothiazide (MICARDIS HCT) 80-12.5 MG tablet Take 1 tablet by mouth daily.   valACYclovir (VALTREX) 1000 MG tablet Take 2 tablets (2,000 mg total) by mouth 2 (two) times daily.   zolpidem (AMBIEN) 10 MG tablet TAKE 1 TABLET BY MOUTH EVERY DAY AT BEDTIME AS NEEDED FOR SLEEP   No current facility-administered medications for this visit. (Other)   REVIEW OF SYSTEMS: ROS   Positive for: Gastrointestinal, Neurological, Eyes Negative for: Constitutional, Skin, Genitourinary, Musculoskeletal, HENT, Endocrine, Cardiovascular, Respiratory, Psychiatric, Allergic/Imm, Heme/Lymph Last edited by Parthenia Ames, COT on 12/06/2022 10:08 AM.      ALLERGIES Allergies  Allergen Reactions   Cymbalta [Duloxetine Hcl] Swelling   PAST MEDICAL HISTORY Past Medical History:  Diagnosis Date  Allergy    Anemia    Anxiety    Blood transfusion without reported diagnosis    Cataract    GERD (gastroesophageal reflux disease)    Hypertension    Hypertensive retinopathy    OU   Macular degeneration    Past Surgical History:  Procedure Laterality Date   ABDOMINAL HYSTERECTOMY     ACHILLES TENDON REPAIR Bilateral    APPENDECTOMY     BACK SURGERY     BILATERAL CARPAL TUNNEL RELEASE     CHOLECYSTECTOMY     COLONOSCOPY     EYE SURGERY Right 09/18/2022   cataract extraction   HERNIA REPAIR     JOINT REPLACEMENT     MELANOMA  EXCISION Right 09/15/2017   Procedure: EXCISION LIPOSARCOMA RIGHT ARM;  Surgeon: Georganna Skeans, MD;  Location: [facility];  Service: General;  Laterality: Right;   SPINE SURGERY     FAMILY HISTORY Family History  Problem Relation Age of Onset   Hypertension Mother    Cancer Sister    Stroke Maternal Grandmother    SOCIAL HISTORY Social History   Tobacco Use   Smoking status: Never   Smokeless tobacco: Never  Vaping Use   Vaping Use: Never used  Substance Use Topics   Alcohol use: Yes    Alcohol/week: 1.0 standard drink of alcohol    Types: 1 Glasses of wine per week   Drug use: No       OPHTHALMIC EXAM: Base Eye Exam     Visual Acuity (Snellen - Linear)       Right Left   Dist cc 20/40 20/30   Dist ph cc NI 20/25 -3    Correction: Glasses         Tonometry (Tonopen, 10:12 AM)       Right Left   Pressure 14 17         Pupils       Pupils Dark Light Shape React APD   Right PERRL 3 2 Round Brisk None   Left PERRL 3 2 Round Brisk None         Visual Fields       Left Right    Full Full         Extraocular Movement       Right Left    Full, Ortho Full, Ortho         Neuro/Psych     Oriented x3: Yes   Mood/Affect: Normal         Dilation     Both eyes: 2.5% Phenylephrine @ 10:12 AM           Slit Lamp and Fundus Exam     Slit Lamp Exam       Right Left   Lids/Lashes Dermatochalasis - upper lid, mild Meibomian gland dysfunction Dermatochalasis - upper lid, mild Meibomian gland dysfunction   Conjunctiva/Sclera White and quiet White and quiet   Cornea 2-3+ fine Punctate epithelial erosions 2+ fine Punctate epithelial erosions inferiorly   Anterior Chamber deep, clear, narrow temporal angle deep, clear, narrow temporal angle   Iris Round and dilated Round and dilated   Lens PCIOL in good position 2-3+ Nuclear sclerosis, 2-3+ Cortical cataract   Anterior Vitreous Vitreous syneresis, Posterior vitreous detachment, prominent  vitreous condensation over macula Vitreous syneresis, Posterior vitreous detachment         Fundus Exam       Right Left   Disc Pink and Sharp, Compact, +PPA Pink and Sharp,  temporal PPP, Compact   C/D Ratio 0.2 0.1   Macula Blunted foveal reflex, drusen, RPE mottling, clumping and atrophy, +CNV, persistent central SRF -- slightly increased, No heme Flat, good foveal reflex, scattered Drusen, RPE mottling and clumping, focal PEDs, No heme or edema   Vessels attenuated, Tortuous attenuated, Tortuous   Periphery Attached, mild reticular degeneration, no heme Attached, no heme           Refraction     Wearing Rx       Sphere Cylinder Axis Add   Right +2.00 +0.50 008 +2.50   Left +2.50 +1.00 154 +2.50    Type: PAL           IMAGING AND PROCEDURES  Imaging and Procedures for '@TODAY'$ @  OCT, Retina - OU - Both Eyes       Right Eye Quality was good. Central Foveal Thickness: 348. Progression has worsened. Findings include normal foveal contour, no IRF, retinal drusen , pigment epithelial detachment, subretinal fluid, outer retinal atrophy (Mild interval increase in central SRF overlying low, stable PEDs).   Left Eye Quality was good. Central Foveal Thickness: 267. Progression has been stable. Findings include normal foveal contour, no IRF, no SRF, retinal drusen .   Notes *Images captured and stored on drive  Diagnosis / Impression:  OD: exu ARMD -- Mild interval increase in central SRF overlying low, stable PEDs OS: non-exu ARMD -- stable  Clinical management:  See below  Abbreviations: NFP - Normal foveal profile. CME - cystoid macular edema. PED - pigment epithelial detachment. IRF - intraretinal fluid. SRF - subretinal fluid. EZ - ellipsoid zone. ERM - epiretinal membrane. ORA - outer retinal atrophy. ORT - outer retinal tubulation. SRHM - subretinal hyper-reflective material     Intravitreal Injection, Pharmacologic Agent - OD - Right Eye       Time  Out 12/06/2022. 11:22 AM. Confirmed correct patient, procedure, site, and patient consented.   Anesthesia Topical anesthesia was used. Anesthetic medications included Lidocaine 2%, Proparacaine 0.5%.   Procedure Preparation included 5% betadine to ocular surface, eyelid speculum. A (32g) needle was used.   Injection: 6 mg faricimab-svoa 6 MG/0.05ML   Route: Intravitreal, Site: Right Eye   NDC: [phone], Lot: [id number], Expiration date: 12/04/2024, Waste: 0 mL   Post-op Post injection exam found visual acuity of at least counting fingers. The patient tolerated the procedure well. There were no complications. The patient received written and verbal post procedure care education. Post injection medications were not given.            ASSESSMENT/PLAN:    ICD-10-CM   1. Exudative age-related macular degeneration of right eye with active choroidal neovascularization (HCC)  H35.3211 OCT, Retina - OU - Both Eyes    Intravitreal Injection, Pharmacologic Agent - OD - Right Eye    faricimab-svoa (VABYSMO) '6mg'$ /0.79m intravitreal injection    2. Intermediate stage nonexudative age-related macular degeneration of left eye  H35.3122     3. Essential hypertension  I10     4. Hypertensive retinopathy of both eyes  H35.033     5. Combined forms of age-related cataract of left eye  H25.812     6. Pseudophakia  Z96.1     7. Glaucoma suspect of both eyes  H40.003     8. Dry eyes  H04.123     9. Combined forms of age-related cataract of both eyes  H25.813      1. Exudative age related macular degeneration, right eye  - FA (  03.10.20) shows +CNV w/ staining/leakage  - repeat FA 09.13.21 shows interval increase in perifoveal leakage OD  - s/p IVA OD #1 (03.10.20), #2 (04.13.20), #3 (05.11.20), #4 (06.23.20), #5 (8.18.20), #6 (10.27.20), #7 (01.26.21), #8 (04.26.21), #9 (08.02.21), #10 (09.13.21), #11 (10.11.21), #12 (11.9.21), #13 (12.14.21), #14 (1.24.22), #15 (3.15.22), #16 (05.24.22), #17  (8.9.22), #18 (10.18.22), #19 (01.03.23), #20 (03.29.23) -- IVA resistance  - s/p IVE OD #1 (06.07.23), #2 (07.19.23), #3 (09.01.23), #4 (10.13.23), #5 (11.10.23), #6(12.22.23)  - pt was doing very well with q3 mo maintenance injections, but then developed interval increases in central SRF over several visits -- IVA resistance  - BCVA 20/40 OD from 20/30  - OCT today shows mild interval increase in central SRF overlying low, stable PEDs at 6 weeks    - discussed IVE resistance and potential benefit of switching medication  - recommend switching to IVV OD #1 today, 02.02.24 due to increased SRF and decreased VA  - pt wishes to be treated with IVV OD  - RBA of procedure discussed, questions answered  - IVE informed consent obtained and signed, 06.07.23 (OD)  - IVV informed consent obtained and signed, 02.02.24 (OD)  - see procedure note  - f/u in 5-6 wks -- DFE/OCT/possible injection  2. Age related macular degeneration, non-exudative, left eye  - The incidence, anatomy, and pathology of dry AMD, risk of progression, and the AREDS and AREDS 2 study including smoking risks discussed with patient.  - intermediate stage  - recommend amsler grid monitoring  3,4. Hypertensive retinopathy OU  - discussed importance of tight BP control  - monitor  5. Mixed form age related cataracts OS  - The symptoms of cataract, surgical options, and treatments and risks were discussed with patient  - under the expert management of Dr. Lucianne Lei  6. Pseudophakia OD  - s/p CE/IOL (Dr. Lucianne Lei, 11.15.23)  - IOL in good position, doing well  - monitor  7. Glaucoma Suspect OU  - IOP 14,17  - under the expert management of Dr. Lucianne Lei  8. Dry eyes OU  - decreased vision OD today mostly due to increased PEE / dry eyes  - pt reports stopping ATs after hearing about product recalls on ATs - recommend artificial tears and lubricating ointment as needed   Ophthalmic Meds Ordered this visit:   This document serves as a  record of services personally performed by Gardiner Sleeper, MD, PhD. It was created on their behalf by Joetta Manners COT, an ophthalmic technician. The creation of this record is the provider's dictation and/or activities during the visit.    Electronically signed by: Joetta Manners COT 02/01/202412:11 PM  This document serves as a record of services personally performed by Gardiner Sleeper, MD, PhD. It was created on their behalf by San Jetty. Owens Shark, OA an ophthalmic technician. The creation of this record is the provider's dictation and/or activities during the visit.    Electronically signed by: San Jetty. Owens Shark, [place] 02.02.2024 12:11 PM  Gardiner Sleeper, M.D., Ph.D. Diseases & Surgery of the Retina and [place] 12/06/2022   I have reviewed the above documentation for accuracy and completeness, and I agree with the above. Gardiner Sleeper, M.D., Ph.D. 12/06/22 12:14 PM   Abbreviations: M myopia (nearsighted); A astigmatism; H hyperopia (farsighted); P presbyopia; Mrx spectacle prescription;  CTL contact lenses; OD right eye; OS left eye; OU both eyes  XT exotropia; ET esotropia; PEK punctate epithelial keratitis; PEE punctate epithelial  erosions; DES dry eye syndrome; MGD meibomian gland dysfunction; ATs artificial tears; PFAT's preservative free artificial tears; [place] nuclear sclerotic cataract; PSC posterior subcapsular cataract; ERM epi-retinal membrane; PVD posterior vitreous detachment; RD retinal detachment; DM diabetes mellitus; DR diabetic retinopathy; NPDR non-proliferative diabetic retinopathy; PDR proliferative diabetic retinopathy; CSME clinically significant macular edema; DME diabetic macular edema; dbh dot blot hemorrhages; CWS cotton wool spot; POAG primary open angle glaucoma; C/D cup-to-disc ratio; HVF humphrey visual field; GVF goldmann visual field; OCT optical coherence tomography; IOP intraocular pressure; BRVO Branch retinal vein  occlusion; CRVO central retinal vein occlusion; CRAO central retinal artery occlusion; BRAO branch retinal artery occlusion; RT retinal tear; SB scleral buckle; PPV pars plana vitrectomy; VH Vitreous hemorrhage; PRP panretinal laser photocoagulation; IVK intravitreal kenalog; VMT vitreomacular traction; MH Macular hole;  NVD neovascularization of the disc; NVE neovascularization elsewhere; AREDS age related eye disease study; ARMD age related macular degeneration; POAG primary open angle glaucoma; EBMD epithelial/anterior basement membrane dystrophy; ACIOL anterior chamber intraocular lens; IOL intraocular lens; PCIOL posterior chamber intraocular lens; Phaco/IOL phacoemulsification with intraocular lens placement; [place] photorefractive keratectomy; LASIK laser assisted in situ keratomileusis; HTN hypertension; DM diabetes mellitus; COPD chronic obstructive pulmonary disease

## 2022-12-06 NOTE — Telephone Encounter (Signed)
Requested Prescriptions  Pending Prescriptions Disp Refills   telmisartan-hydrochlorothiazide (MICARDIS HCT) 80-12.5 MG tablet 90 tablet 0    Sig: Take 1 tablet by mouth daily.     Cardiovascular: ARB + Diuretic Combos Failed - 12/05/2022  3:54 PM      Failed - K in normal range and within 180 days    Potassium  Date Value Ref Range Status  02/19/2022 4.0 3.5 - 5.3 mmol/L Final         Failed - Na in normal range and within 180 days    Sodium  Date Value Ref Range Status  02/19/2022 143 135 - 146 mmol/L Final         Failed - Cr in normal range and within 180 days    Creat  Date Value Ref Range Status  02/19/2022 1.10 (H) 0.60 - 1.00 mg/dL Final         Failed - eGFR is 10 or above and within 180 days    GFR, Est African American  Date Value Ref Range Status  03/12/2021 55 (L) > OR = 60 mL/min/1.54m Final   GFR, Est Non African American  Date Value Ref Range Status  03/12/2021 47 (L) > OR = 60 mL/min/1.734mFinal   eGFR  Date Value Ref Range Status  02/19/2022 53 (L) > OR = 60 mL/min/1.7337minal    Comment:    The eGFR is based on the CKD-EPI 2021 equation. To calculate  the new eGFR from a previous Creatinine or Cystatin C result, go to https://www.kidney.org/professionals/ kdoqi/gfr%5Fcalculator          Failed - Valid encounter within last 6 months    Recent Outpatient Visits           9 months ago Benign essential HTN   [facility], [facility]   1 year ago Benign essential HTN   [facility]   2 years ago Benign essential HTN   [facility]   3 years ago Acute idiopathic gout of right wrist   [facility]   3 years ago General medical exam   [facility]       Future Appointments             In 6 days [place], [facility]ammie McgeeD [place] not pregnant      Passed - Last BP in normal range    BP Readings from Last 1 Encounters:  10/10/22 116/72

## 2022-12-06 NOTE — Telephone Encounter (Signed)
Called pt - made follow up appt.

## 2022-12-09 ENCOUNTER — Other Ambulatory Visit: Payer: Self-pay | Admitting: Family Medicine

## 2022-12-09 ENCOUNTER — Telehealth: Payer: Self-pay | Admitting: Family Medicine

## 2022-12-09 DIAGNOSIS — L299 Pruritus, unspecified: Secondary | ICD-10-CM

## 2022-12-09 MED ORDER — MOMETASONE FUROATE 0.1 % EX CREA: TOPICAL_CREAM | Freq: Every day | CUTANEOUS | 0 refills | Status: DC

## 2022-12-09 NOTE — Telephone Encounter (Signed)
Prescription Request  12/09/2022  Is this a "Controlled Substance" medicine? No  LOV: 02/19/2022  What is the name of the medication or equipment? mometasone (ELOCON) 0.1 % cream   Have you contacted your pharmacy to request a refill? Yes   Which pharmacy would you like this sent to?  [id number]: [phone]: [phone]   Patient notified that their request is being sent to the clinical staff for review and that they should receive a response within 2 business days.   Please advise at [phone]

## 2022-12-12 ENCOUNTER — Encounter: Payer: Self-pay | Admitting: Family Medicine

## 2022-12-12 ENCOUNTER — Ambulatory Visit (INDEPENDENT_AMBULATORY_CARE_PROVIDER_SITE_OTHER): Payer: Medicare Other | Admitting: Family Medicine

## 2022-12-12 VITALS — BP 126/74 | HR 71 | Temp 98.2°F | Ht 59.0 in | Wt 237.0 lb

## 2022-12-12 DIAGNOSIS — G8929 Other chronic pain: Secondary | ICD-10-CM

## 2022-12-12 DIAGNOSIS — I10 Essential (primary) hypertension: Secondary | ICD-10-CM

## 2022-12-12 DIAGNOSIS — M25511 Pain in right shoulder: Secondary | ICD-10-CM

## 2022-12-12 NOTE — Progress Notes (Signed)
Subjective:    Patient ID: Faith Reeves, female    DOB: September 09, 1951, 72 y.o.   MRN: [id number]  I saw the patient in April 2023 for right shoulder pain.  I attempted a cortisone injection in her right shoulder but she saw no benefit from this.  I did refer the patient to orthopedic who also attempted a cortisone injection into her right glenohumeral joint without any benefit.  They recommended physical therapy but the patient saw no benefit from this.  Therefore she has been suffering with right shoulder pain now for 10 months.  She is unable to abduct her arm greater than 90 degrees.  Even with passive abduction she is limited to 90 degrees.  She has a positive drop sign.  She has positive empty can testing.  She has crepitus with range of motion and severe pain.  She also has a positive O'Brien's test  Her blood pressure is well-controlled today.  For lab work to monitor for any electrolyte disturbances on her blood pressure medication. Past Medical History:  Diagnosis Date   Allergy    Anemia    Anxiety    Blood transfusion without reported diagnosis    Cataract    GERD (gastroesophageal reflux disease)    Hypertension    Hypertensive retinopathy    OU   Macular degeneration    Past Surgical History:  Procedure Laterality Date   ABDOMINAL HYSTERECTOMY     ACHILLES TENDON REPAIR Bilateral    APPENDECTOMY     BACK SURGERY     BILATERAL CARPAL TUNNEL RELEASE     CHOLECYSTECTOMY     COLONOSCOPY     EYE SURGERY Right 09/18/2022   cataract extraction   HERNIA REPAIR     JOINT REPLACEMENT     MELANOMA EXCISION Right 09/15/2017   Procedure: EXCISION LIPOSARCOMA RIGHT ARM;  Surgeon: Georganna Skeans, MD;  Location: [facility];  Service: General;  Laterality: Right;   SPINE SURGERY     Current Outpatient Medications on File Prior to Visit  Medication Sig Dispense Refill   albuterol (PROVENTIL HFA;VENTOLIN HFA) 108 (90 Base) MCG/ACT inhaler Inhale 2 puffs into the lungs every 4 (four) hours  as needed for wheezing or shortness of breath. 1 Inhaler 0   ALPRAZolam (XANAX) 0.5 MG tablet Take 1 tablet (0.5 mg total) by mouth 3 (three) times daily as needed for anxiety. 30 tablet 0   celecoxib (CELEBREX) 200 MG capsule TAKE 1 CAPSULE BY MOUTH EVERY DAY 90 capsule 3   gabapentin (NEURONTIN) 300 MG capsule Take 1 capsule by mouth three times a day. 90 capsule 1   ipratropium (ATROVENT) 0.06 % nasal spray SPRAY 2 SPRAYS INTO EACH NOSTRIL 4 TIMES A DAY. 45 mL 1   levocetirizine (XYZAL) 5 MG tablet TAKE 1 TABLET BY MOUTH EVERY DAY IN THE EVENING 90 tablet 1   methocarbamol (ROBAXIN) 500 MG tablet Take 1 tablet (500 mg total) by mouth 2 (two) times daily as needed for muscle spasms. 20 tablet 2   mometasone (ELOCON) 0.1 % cream Apply topically daily. 45 g 0   oxyCODONE-acetaminophen (PERCOCET) 10-325 MG tablet Take 1 tablet by mouth every 4 (four) hours as needed for pain (G89.4). 90 tablet 0   pantoprazole (PROTONIX) 40 MG tablet TAKE 1 TABLET BY MOUTH EVERY DAY 90 tablet 3   predniSONE (STERAPRED UNI-PAK 21 TAB) 10 MG (21) TBPK tablet Take as directed 21 tablet 0   sertraline (ZOLOFT) 50 MG tablet TAKE 1 AND 1/2 TABLETS  DAILY BY MOUTH 135 tablet 1   telmisartan-hydrochlorothiazide (MICARDIS HCT) 80-12.5 MG tablet Take 1 tablet by mouth daily. 90 tablet 0   valACYclovir (VALTREX) 1000 MG tablet Take 2 tablets (2,000 mg total) by mouth 2 (two) times daily. 4 tablet 2   zolpidem (AMBIEN) 10 MG tablet TAKE 1 TABLET BY MOUTH EVERY DAY AT BEDTIME AS NEEDED FOR SLEEP 30 tablet 5   No current facility-administered medications on file prior to visit.   Allergies  Allergen Reactions   Cymbalta [Duloxetine Hcl] Swelling   Social History   Socioeconomic History   Marital status: Married    Spouse name: Not on file   Number of children: Not on file   Years of education: Not on file   Highest education level: Not on file  Occupational History   Not on file  Tobacco Use   Smoking status: Never    Smokeless tobacco: Never  Vaping Use   Vaping Use: Never used  Substance and Sexual Activity   Alcohol use: Yes    Alcohol/week: 1.0 standard drink of alcohol    Types: 1 Glasses of wine per week   Drug use: No   Sexual activity: Never  Other Topics Concern   Not on file  Social History Narrative   Recent death of son; lives with husband; originally from [place]; most of family still lives in the [place]    Social Determinants of Health   Financial Resource Strain: [place]  (10/10/2022)   Overall Financial Resource Strain (CARDIA)    Difficulty of Paying Living Expenses: Not hard at all  Food Insecurity: No Food Insecurity (10/10/2022)   Hunger Vital Sign    Worried About Running Out of Food in the Last Year: Never true    [place] in the Last Year: Never true  Transportation Needs: No Transportation Needs (10/10/2022)   PRAPARE - Hydrologist (Medical): No    Lack of Transportation (Non-Medical): No  Physical Activity: Inactive (10/10/2022)   Exercise Vital Sign    Days of Exercise per Week: 0 days    Minutes of Exercise per Session: 0 min  Stress: No Stress Concern Present (10/10/2022)   [place]    Feeling of Stress : Not at all  Social Connections: Moderately Isolated (10/10/2022)   Social Connection and Isolation Panel [NHANES]    Frequency of Communication with Friends and Family: More than three times a week    Frequency of Social Gatherings with Friends and Family: Twice a week    Attends Religious Services: Never    Marine scientist or Organizations: No    Attends Archivist Meetings: Never    Marital Status: Married  Human resources officer Violence: Not At Risk (10/10/2022)   Humiliation, Afraid, Rape, and Kick questionnaire    Fear of Current or Ex-Partner: No    Emotionally Abused: No    Physically Abused: No    Sexually Abused: No     Review of  Systems  All other systems reviewed and are negative.      Objective:   Physical Exam Constitutional:      Appearance: She is obese.  Cardiovascular:     Rate and Rhythm: Normal rate and regular rhythm.     Heart sounds: Normal heart sounds.  Pulmonary:     Effort: Pulmonary effort is normal.     Breath sounds: Normal breath sounds.  Musculoskeletal:     Right shoulder: Tenderness and crepitus present. Decreased range of motion. Decreased strength.     Thoracic back: Scoliosis present.     Lumbar back: Scoliosis present.  Neurological:     Mental Status: She is alert.           Assessment & Plan:  Benign essential HTN - Plan: CBC with Differential/Platelet, COMPLETE METABOLIC PANEL WITH GFR  Chronic right shoulder pain Patient has tried 2 separate cortisone injections in her shoulder and physical therapy without any relief.  Therefore I believe we need to pursue an MRI of the right shoulder to evaluate for possible tear of the rotator cuff.  I will schedule this for her.  I will also check a CBC a CMP to monitor for any electrolyte disturbances regarding her blood pressure medication.  Patient will continue to take oxycodone for her chronic back pain related to her scoliosis.  There is no evidence of any abuse or diversion.

## 2022-12-13 LAB — CBC WITH DIFFERENTIAL/PLATELET
Absolute Monocytes: 441 cells/uL (ref 200–950)
Basophils Absolute: 50 cells/uL (ref 0–200)
Basophils Relative: 0.8 %
Eosinophils Absolute: 321 cells/uL (ref 15–500)
Eosinophils Relative: 5.1 %
HCT: 43.4 % (ref 35.0–45.0)
Hemoglobin: 14.6 g/dL (ref 11.7–15.5)
Lymphs Abs: 1676 cells/uL (ref 850–3900)
MCH: 30 pg (ref 27.0–33.0)
MCHC: 33.6 g/dL (ref 32.0–36.0)
MCV: 89.3 fL (ref 80.0–100.0)
MPV: 11.6 fL (ref 7.5–12.5)
Monocytes Relative: 7 %
Neutro Abs: 3812 cells/uL (ref 1500–7800)
Neutrophils Relative %: 60.5 %
Platelets: 206 10*3/uL (ref 140–400)
RBC: 4.86 10*6/uL (ref 3.80–5.10)
RDW: 13.7 % (ref 11.0–15.0)
Total Lymphocyte: 26.6 %
WBC: 6.3 10*3/uL (ref 3.8–10.8)

## 2022-12-13 LAB — COMPLETE METABOLIC PANEL WITH GFR
AG Ratio: 1.1 (calc) (ref 1.0–2.5)
ALT: 18 U/L (ref 6–29)
AST: 21 U/L (ref 10–35)
Albumin: 3.6 g/dL (ref 3.6–5.1)
Alkaline phosphatase (APISO): 104 U/L (ref 37–153)
BUN/Creatinine Ratio: 18 (calc) (ref 6–22)
BUN: 20 mg/dL (ref 7–25)
CO2: 27 mmol/L (ref 20–32)
Calcium: 9.2 mg/dL (ref 8.6–10.4)
Chloride: 103 mmol/L (ref 98–110)
Creat: 1.14 mg/dL — ABNORMAL HIGH (ref 0.60–1.00)
Globulin: 3.2 g/dL (calc) (ref 1.9–3.7)
Glucose, Bld: 94 mg/dL (ref 65–99)
Potassium: 4.2 mmol/L (ref 3.5–5.3)
Sodium: 140 mmol/L (ref 135–146)
Total Bilirubin: 0.5 mg/dL (ref 0.2–1.2)
Total Protein: 6.8 g/dL (ref 6.1–8.1)
eGFR: 51 mL/min/{1.73_m2} — ABNORMAL LOW (ref >=60)

## 2022-12-17 ENCOUNTER — Telehealth: Payer: Self-pay | Admitting: Family Medicine

## 2022-12-17 ENCOUNTER — Other Ambulatory Visit: Payer: Self-pay | Admitting: Family Medicine

## 2022-12-17 MED ORDER — DIAZEPAM 10 MG PO TABS: 10.0000 mg | ORAL_TABLET | Freq: Once | ORAL | 0 refills | Status: AC

## 2022-12-17 NOTE — Telephone Encounter (Signed)
Patient called to report she's having an MRI on 12/26/22. Patient stated she's claustrophobic and needs a script sent to her pharmacy to keep her calm.  [facility]   [id number] [place]Phone: [phone] Fax: [phone] #: [id number]  Please advise patient at [phone] medication sent to pharmacy.

## 2022-12-26 ENCOUNTER — Ambulatory Visit (HOSPITAL_COMMUNITY)
Admission: RE | Admit: 2022-12-26 | Discharge: 2022-12-26 | Disposition: A | Discharge status: Home / Self Care | Payer: Medicare Other | Source: Ambulatory Visit | Attending: Family Medicine | Admitting: Family Medicine

## 2022-12-26 DIAGNOSIS — M25511 Pain in right shoulder: Secondary | ICD-10-CM | POA: Diagnosis not present

## 2022-12-26 DIAGNOSIS — G8929 Other chronic pain: Secondary | ICD-10-CM | POA: Insufficient documentation

## 2022-12-31 ENCOUNTER — Other Ambulatory Visit: Payer: Self-pay | Admitting: Family Medicine

## 2022-12-31 DIAGNOSIS — T7840XD Allergy, unspecified, subsequent encounter: Secondary | ICD-10-CM

## 2023-01-03 ENCOUNTER — Other Ambulatory Visit: Payer: Self-pay | Admitting: Family Medicine

## 2023-01-03 DIAGNOSIS — L299 Pruritus, unspecified: Secondary | ICD-10-CM

## 2023-01-03 NOTE — Telephone Encounter (Addendum)
Prescription Request  01/03/2023   LOV: 12/12/2022  What is the name of the medication or equipment? mometasone (ELOCON) 0.1 % cream , levocetirizine (XYZAL) 5 MG tablet ,   And mometasone (ELOCON) 0.1 % cream  Have you contacted your pharmacy to request a refill? Yes   Which pharmacy would you like this sent to?  [id number] [place] [place]: [phone]: [phone]   Patient notified that their request is being sent to the clinical staff for review and that they should receive a response within 2 business days.   Please advise at [phone]

## 2023-01-06 ENCOUNTER — Other Ambulatory Visit: Payer: Self-pay

## 2023-01-06 DIAGNOSIS — G8929 Other chronic pain: Secondary | ICD-10-CM

## 2023-01-06 NOTE — Telephone Encounter (Signed)
Unable to refill per protocol, Rx request is too soon. Last refills 08/05/22 for 90 and 1 refill, 12/09/22 for 49 g.  Requested Prescriptions  Pending Prescriptions Disp Refills   levocetirizine (XYZAL) 5 MG tablet 90 tablet 1    Sig: every evening.     Ear, Nose, and Throat:  Antihistamines - levocetirizine dihydrochloride Failed - 01/03/2023  3:50 PM      Failed - Cr in normal range and within 360 days    Creat  Date Value Ref Range Status  12/12/2022 1.14 (H) 0.60 - 1.00 mg/dL Final         Passed - eGFR is 10 or above and within 360 days    GFR, Est African American  Date Value Ref Range Status  03/12/2021 55 (L) > OR = 60 mL/min/1.47m Final   GFR, Est Non African American  Date Value Ref Range Status  03/12/2021 47 (L) > OR = 60 mL/min/1.772mFinal   eGFR  Date Value Ref Range Status  12/12/2022 51 (L) > OR = 60 mL/min/1.7374minal         Passed - Valid encounter within last 12 months    Recent Outpatient Visits           10 months ago Benign essential HTN   BroHavenckard, WarCammie McgeeD   1 year ago Benign essential HTN   BroGoochlandcSusy FrizzleD   2 years ago Benign essential HTN   BroSymsoniaarren T, MD   3 years ago Acute idiopathic gout of right wrist   BroCasselmancDennard SchaumannarCammie McgeeD   3 years ago General medical exam   BroGlendaleckard, WarCammie McgeeD       Future Appointments             In 1 week Xu,Leandrew KoyanagiD [facility]              mometasone (ELOCON) 0.1 % cream 45 g 0    Sig: Apply topically daily.     Off-Protocol Failed - 01/03/2023  3:50 PM      Failed - Medication not assigned to a protocol, review manually.      Passed - Valid encounter within last 12 months    Recent Outpatient Visits           10 months ago Benign essential HTN   BroGlenwoodckard, WarCammie McgeeD   1 year ago  Benign essential HTN   BroVictoriacDennard SchaumannrCammie McgeeD   2 years ago Benign essential HTN   BroMarked TreecSusy FrizzleD   3 years ago Acute idiopathic gout of right wrist   BroCedar Rapidsckard, WarCammie McgeeD   3 years ago General medical exam   BroArgyleckard, WarCammie McgeeD       Future Appointments             In 1 week Xu,Leandrew [facility]agiD [facility]

## 2023-01-14 ENCOUNTER — Ambulatory Visit (INDEPENDENT_AMBULATORY_CARE_PROVIDER_SITE_OTHER): Payer: Medicare Other | Admitting: Orthopaedic Surgery

## 2023-01-14 DIAGNOSIS — G8929 Other chronic pain: Secondary | ICD-10-CM

## 2023-01-14 DIAGNOSIS — M25511 Pain in right shoulder: Secondary | ICD-10-CM | POA: Diagnosis not present

## 2023-01-14 NOTE — Progress Notes (Signed)
[facility] Note  01/17/2023   CHIEF COMPLAINT Patient presents for Retina Follow Up  HISTORY OF PRESENT ILLNESS: Faith Reeves is a 72 y.o. female who presents to the clinic today for:   HPI     Retina Follow Up   Patient presents with  Wet AMD.  In right eye.  Severity is moderate.  Duration of 6 weeks.  Since onset it is stable.        Comments   Patient states vision the same OU.      Last edited by Roselee Nova D, COT on 01/17/2023  1:14 PM.     Patient feels that the vision is blurry in the right eye.   Referring physician: Susy Frizzle, MD [address] [facility] [place],  [place] [address]  HISTORICAL INFORMATION:   Selected notes from the MEDICAL RECORD NUMBER Referred by Dr. Quentin Ore for concern of exu ARMD OU   CURRENT MEDICATIONS: No current outpatient medications on file. (Ophthalmic Drugs)   No current facility-administered medications for this visit. (Ophthalmic Drugs)   Current Outpatient Medications (Other)  Medication Sig   albuterol (PROVENTIL HFA;VENTOLIN HFA) 108 (90 Base) MCG/ACT inhaler Inhale 2 puffs into the lungs every 4 (four) hours as needed for wheezing or shortness of breath.   ALPRAZolam (XANAX) 0.5 MG tablet Take 1 tablet (0.5 mg total) by mouth 3 (three) times daily as needed for anxiety.   celecoxib (CELEBREX) 200 MG capsule TAKE 1 CAPSULE BY MOUTH EVERY DAY   gabapentin (NEURONTIN) 300 MG capsule Take 1 capsule by mouth three times a day.   ipratropium (ATROVENT) 0.06 % nasal spray SPRAY 2 SPRAYS INTO EACH NOSTRIL 4 TIMES A DAY.   levocetirizine (XYZAL) 5 MG tablet TAKE 1 TABLET BY MOUTH EVERY DAY IN THE EVENING   methocarbamol (ROBAXIN) 500 MG tablet Take 1 tablet (500 mg total) by mouth 2 (two) times daily as needed for muscle spasms.   mometasone (ELOCON) 0.1 % cream Apply topically daily.   oxyCODONE-acetaminophen (PERCOCET) 10-325 MG tablet Take 1 tablet by mouth every 4 (four) hours as needed  for pain (G89.4).   pantoprazole (PROTONIX) 40 MG tablet TAKE 1 TABLET BY MOUTH EVERY DAY   sertraline (ZOLOFT) 50 MG tablet TAKE 1 AND 1/2 TABLETS DAILY BY MOUTH   telmisartan-hydrochlorothiazide (MICARDIS HCT) 80-12.5 MG tablet Take 1 tablet by mouth daily.   valACYclovir (VALTREX) 1000 MG tablet Take 2 tablets (2,000 mg total) by mouth 2 (two) times daily.   zolpidem (AMBIEN) 10 MG tablet TAKE 1 TABLET BY MOUTH EVERY DAY AT BEDTIME AS NEEDED FOR SLEEP   predniSONE (STERAPRED UNI-PAK 21 TAB) 10 MG (21) TBPK tablet Take as directed (Patient not taking: Reported on 01/17/2023)   No current facility-administered medications for this visit. (Other)   REVIEW OF SYSTEMS: ROS   Positive for: Gastrointestinal, Neurological, Eyes Negative for: Constitutional, Skin, Genitourinary, Musculoskeletal, HENT, Endocrine, Cardiovascular, Respiratory, Psychiatric, Allergic/Imm, Heme/Lymph Last edited by Roselee Nova D, COT on 01/17/2023  1:14 PM.       ALLERGIES Allergies  Allergen Reactions   Cymbalta [Duloxetine Hcl] Swelling   PAST MEDICAL HISTORY Past Medical History:  Diagnosis Date   Allergy    Anemia    Anxiety    Blood transfusion without reported diagnosis    Cataract    GERD (gastroesophageal reflux disease)    Hypertension    Hypertensive retinopathy    OU   Macular degeneration  Past Surgical History:  Procedure Laterality Date   ABDOMINAL HYSTERECTOMY     ACHILLES TENDON REPAIR Bilateral    APPENDECTOMY     BACK SURGERY     BILATERAL CARPAL TUNNEL RELEASE     CHOLECYSTECTOMY     COLONOSCOPY     EYE SURGERY Right 09/18/2022   cataract extraction   HERNIA REPAIR     JOINT REPLACEMENT     MELANOMA EXCISION Right 09/15/2017   Procedure: EXCISION LIPOSARCOMA RIGHT ARM;  Surgeon: Georganna Skeans, MD;  Location: [place];  Service: General;  Laterality: Right;   SPINE SURGERY     FAMILY HISTORY Family History  Problem Relation Age of Onset   Hypertension Mother    Cancer  Sister    Stroke Maternal Grandmother    SOCIAL HISTORY Social History   Tobacco Use   Smoking status: Never   Smokeless tobacco: Never  Vaping Use   Vaping Use: Never used  Substance Use Topics   Alcohol use: Yes    Alcohol/week: 1.0 standard drink of alcohol    Types: 1 Glasses of wine per week   Drug use: No       OPHTHALMIC EXAM: Base Eye Exam     Visual Acuity (Snellen - Linear)       Right Left   Dist cc 20/40 -2 20/25 -2   Dist ph cc 20/40 -1 NI         Tonometry (Tonopen, 1:16 PM)       Right Left   Pressure 10 10         Pupils       Dark Light Shape React APD   Right 3 2 Round Brisk None   Left 3 2 Round Brisk None         Visual Fields (Counting fingers)       Left Right    Full Full         Extraocular Movement       Right Left    Full, Ortho Full, Ortho         Neuro/Psych     Oriented x3: Yes   Mood/Affect: Normal         Dilation     Both eyes: 1.0% Mydriacyl, 2.5% Phenylephrine @ 1:10 PM           Slit Lamp and Fundus Exam     Slit Lamp Exam       Right Left   Lids/Lashes Dermatochalasis - upper lid, mild Meibomian gland dysfunction Dermatochalasis - upper lid, mild Meibomian gland dysfunction   Conjunctiva/Sclera White and quiet White and quiet   Cornea 2-3+ fine Punctate epithelial erosions 2+ fine Punctate epithelial erosions inferiorly   Anterior Chamber deep, clear, narrow temporal angle deep, clear, narrow temporal angle   Iris Round and dilated Round and dilated   Lens PCIOL in good position 2-3+ Nuclear sclerosis, 2-3+ Cortical cataract   Anterior Vitreous Vitreous syneresis, Posterior vitreous detachment, prominent vitreous condensation now nasal to disc Vitreous syneresis, Posterior vitreous detachment         Fundus Exam       Right Left   Disc Pink and Sharp, Compact, +PPA Pink and Sharp, temporal PPP, Compact   C/D Ratio 0.2 0.1   Macula Blunted foveal reflex, drusen, RPE mottling,  clumping and atrophy, +CNV, persistent central SRF -- slightly increased, No heme Flat, good foveal reflex, scattered Drusen, RPE mottling and clumping, focal PEDs, No heme or edema  Vessels attenuated, Tortuous attenuated, Tortuous   Periphery Attached, mild reticular degeneration, no heme Attached, no heme           Refraction     Wearing Rx       Sphere Cylinder Axis Add   Right +2.00 +0.50 008 +2.50   Left +2.50 +1.00 154 +2.50    Type: PAL           IMAGING AND PROCEDURES  Imaging and Procedures for @TODAY @          ASSESSMENT/PLAN:    ICD-10-CM   1. Exudative age-related macular degeneration of right eye with active choroidal neovascularization (HCC)  H35.3211 OCT, Retina - OU - Both Eyes    2. Intermediate stage nonexudative age-related macular degeneration of left eye  H35.3122     3. Essential hypertension  I10     4. Hypertensive retinopathy of both eyes  H35.033     5. Combined forms of age-related cataract of left eye  H25.812     6. Pseudophakia  Z96.1     7. Glaucoma suspect of both eyes  H40.003     8. Dry eyes  H04.123      1. Exudative age related macular degeneration, right eye  - FA (03.10.20) shows +CNV w/ staining/leakage  - repeat FA 09.13.21 shows interval increase in perifoveal leakage OD - s/p IVA OD #1 (03.10.20), #2 (04.13.20), #3 (05.11.20), #4 (06.23.20), #5 (8.18.20), #6 (10.27.20), #7 (01.26.21), #8 (04.26.21), #9 (08.02.21), #10 (09.13.21), #11 (10.11.21), #12 (11.9.21), #13 (12.14.21), #14 (1.24.22), #15 (3.15.22), #16 (05.24.22), #17 (8.9.22), #18 (10.18.22), #19 (01.03.23), #20 (03.29.23) -- IVA resistance - s/p IVE OD #1 (06.07.23), #2 (07.19.23), #3 (09.01.23), #4 (10.13.23), #5 (11.10.23), #6 (12.22.23)  -s/p IVV # 1 (02.02.24) - pt was doing very well with q3 mo maintenance injections, but then developed interval increases in central SRF over several visits -- IVA resistance  - BCVA 20/40 OD from 20/30 - OCT today  shows mild interval increase in central SRF overlying low, stable PEDs at 6 weeks    - discussed IVE resistance and potential benefit of switching medication - recommend switching to IVV OD #2 today, 03.15.24 due to increased SRF and decreased VA  - pt wishes to be treated with IVV OD  - RBA of procedure discussed, questions answered  - IVE informed consent obtained and signed, 06.07.23 (OD)  - IVV informed consent obtained and signed, 02.02.24 (OD)  - see procedure note  - f/u in 5-6 wks -- DFE/OCT/possible injection  2. Age related macular degeneration, non-exudative, left eye - The incidence, anatomy, and pathology of dry AMD, risk of progression, and the AREDS and AREDS 2 study including smoking risks discussed with patient.  - intermediate stage  - recommend amsler grid monitoring  3,4. Hypertensive retinopathy OU  - discussed importance of tight BP control  - monitor  5. Mixed form age related cataracts OS - The symptoms of cataract, surgical options, and treatments and risks were discussed with patient  - under the expert management of Dr. Lucianne Lei  6. Pseudophakia OD  - s/p CE/IOL (Dr. Lucianne Lei, 11.15.23)  - IOL in good position, doing well  - monitor  7. Glaucoma Suspect OU  - IOP 10 OU  - under the expert management of Dr. Lucianne Lei  8. Dry eyes OU  - decreased vision OD today mostly due to increased PEE / dry eyes  - pt reports stopping ATs after hearing about product recalls on ATs - recommend  artificial tears and lubricating ointment as needed   Ophthalmic Meds Ordered this visit:   This document serves as a record of services personally performed by Gardiner Sleeper, MD, PhD. It was created on their behalf by Joetta Manners COT, an ophthalmic technician. The creation of this record is the provider's dictation and/or activities during the visit.    Electronically signed by: Joetta Manners COT 03/12/20241:31 PM  This document serves as a record of services personally  performed by Gardiner Sleeper, MD, PhD. It was created on their behalf by Renaldo Reel, [place] an ophthalmic technician. The creation of this record is the provider's dictation and/or activities during the visit.    Electronically signed by:  Renaldo Reel, COT 03.15.24 1:34 PM  Abbreviations: M myopia (nearsighted); A astigmatism; H hyperopia (farsighted); P presbyopia; Mrx spectacle prescription;  CTL contact lenses; OD right eye; OS left eye; OU both eyes  XT exotropia; ET esotropia; PEK punctate epithelial keratitis; PEE punctate epithelial erosions; DES dry eye syndrome; MGD meibomian gland dysfunction; ATs artificial tears; PFAT's preservative free artificial tears; [place] nuclear sclerotic cataract; PSC posterior subcapsular cataract; ERM epi-retinal membrane; PVD posterior vitreous detachment; RD retinal detachment; DM diabetes mellitus; DR diabetic retinopathy; NPDR non-proliferative diabetic retinopathy; PDR proliferative diabetic retinopathy; CSME clinically significant macular edema; DME diabetic macular edema; dbh dot blot hemorrhages; CWS cotton wool spot; POAG primary open angle glaucoma; C/D cup-to-disc ratio; HVF humphrey visual field; GVF goldmann visual field; OCT optical coherence tomography; IOP intraocular pressure; BRVO Branch retinal vein occlusion; CRVO central retinal vein occlusion; CRAO central retinal artery occlusion; BRAO branch retinal artery occlusion; RT retinal tear; SB scleral buckle; PPV pars plana vitrectomy; VH Vitreous hemorrhage; PRP panretinal laser photocoagulation; IVK intravitreal kenalog; VMT vitreomacular traction; MH Macular hole;  NVD neovascularization of the disc; NVE neovascularization elsewhere; AREDS age related eye disease study; ARMD age related macular degeneration; POAG primary open angle glaucoma; EBMD epithelial/anterior basement membrane dystrophy; ACIOL anterior chamber intraocular lens; IOL intraocular lens; PCIOL posterior chamber intraocular  lens; Phaco/IOL phacoemulsification with intraocular lens placement; [place] photorefractive keratectomy; LASIK laser assisted in situ keratomileusis; HTN hypertension; DM diabetes mellitus; COPD chronic obstructive pulmonary disease

## 2023-01-14 NOTE — Progress Notes (Signed)
Office Visit Note   Patient: Faith Reeves           Date of Birth: Jun 05, 1951           MRN: [id number] Visit Date: 01/14/2023              Requested by: Susy Frizzle, MD [address] [facility] [place],  [place] [address] PCP: Susy Frizzle, MD   Assessment & Plan: Visit Diagnoses:  1. Chronic right shoulder pain     Plan: Impression is 72 year old female with right rotator cuff arthropathy.  Chronic supraspinatus tear with fatty infiltration of the muscles.  Glenohumeral chondromalacia and large joint effusion and severe biceps tendinopathy.  Based on these findings and her age I have recommended referral to Dr. Marlou Sa for surgical consultation for reverse shoulder replacement.  Follow-Up Instructions: No follow-ups on file.   Orders:  Orders Placed This Encounter  Procedures   Ambulatory referral to Orthopedic Surgery   No orders of the defined types were placed in this encounter.     Procedures: No procedures performed   Clinical Data: No additional findings.   Subjective: Chief Complaint  Patient presents with   Right Shoulder - Pain    HPI  Faith Reeves is a 72 year old female here with chronic right shoulder pain.  We saw her about 10 months ago for this.  She recently underwent an MRI that was ordered by her PCP.  She is here for review of the MRI based on the findings.  Her symptoms are unchanged.  Review of Systems   Objective: Vital Signs: There were no vitals taken for this visit.  Physical Exam  Ortho Exam  Examination right shoulder shows near normal passive range of motion with pain.  She has significant weakness to manual muscle testing of the supraspinatus and infraspinatus with pain.  Specialty Comments:  No specialty comments available.  Imaging: No results found.   PMFS History: Patient Active Problem List   Diagnosis Date Noted   Macular degeneration of right eye 12/18/2016   Allergy    Anxiety    Blood transfusion without  reported diagnosis    Cataract    Hypertension    Past Medical History:  Diagnosis Date   Allergy    Anemia    Anxiety    Blood transfusion without reported diagnosis    Cataract    GERD (gastroesophageal reflux disease)    Hypertension    Hypertensive retinopathy    OU   Macular degeneration     Family History  Problem Relation Age of Onset   Hypertension Mother    Cancer Sister    Stroke Maternal Grandmother     Past Surgical History:  Procedure Laterality Date   ABDOMINAL HYSTERECTOMY     ACHILLES TENDON REPAIR Bilateral    APPENDECTOMY     BACK SURGERY     BILATERAL CARPAL TUNNEL RELEASE     CHOLECYSTECTOMY     COLONOSCOPY     EYE SURGERY Right 09/18/2022   cataract extraction   HERNIA REPAIR     JOINT REPLACEMENT     MELANOMA EXCISION Right 09/15/2017   Procedure: EXCISION LIPOSARCOMA RIGHT ARM;  Surgeon: Georganna Skeans, MD;  Location: [place];  Service: General;  Laterality: Right;   SPINE SURGERY     Social History   Occupational History   Not on file  Tobacco Use   Smoking status: Never   Smokeless tobacco: Never  Vaping Use   Vaping  Use: Never used  Substance and Sexual Activity   Alcohol use: Yes    Alcohol/week: 1.0 standard drink of alcohol    Types: 1 Glasses of wine per week   Drug use: No   Sexual activity: Never

## 2023-01-17 ENCOUNTER — Ambulatory Visit (INDEPENDENT_AMBULATORY_CARE_PROVIDER_SITE_OTHER): Payer: Medicare Other | Admitting: Ophthalmology

## 2023-01-17 ENCOUNTER — Encounter (INDEPENDENT_AMBULATORY_CARE_PROVIDER_SITE_OTHER): Payer: Self-pay | Admitting: Ophthalmology

## 2023-01-17 DIAGNOSIS — Z961 Presence of intraocular lens: Secondary | ICD-10-CM | POA: Diagnosis not present

## 2023-01-17 DIAGNOSIS — H04123 Dry eye syndrome of bilateral lacrimal glands: Secondary | ICD-10-CM

## 2023-01-17 DIAGNOSIS — H353211 Exudative age-related macular degeneration, right eye, with active choroidal neovascularization: Secondary | ICD-10-CM

## 2023-01-17 DIAGNOSIS — H25812 Combined forms of age-related cataract, left eye: Secondary | ICD-10-CM | POA: Diagnosis not present

## 2023-01-17 DIAGNOSIS — I10 Essential (primary) hypertension: Secondary | ICD-10-CM

## 2023-01-17 DIAGNOSIS — H353122 Nonexudative age-related macular degeneration, left eye, intermediate dry stage: Secondary | ICD-10-CM | POA: Diagnosis not present

## 2023-01-17 DIAGNOSIS — H40003 Preglaucoma, unspecified, bilateral: Secondary | ICD-10-CM

## 2023-01-17 DIAGNOSIS — H35033 Hypertensive retinopathy, bilateral: Secondary | ICD-10-CM

## 2023-01-17 MED ORDER — FARICIMAB-SVOA 6 MG/0.05ML IZ SOLN
6.0000 mg | INTRAVITREAL | Status: AC | PRN
  Administered 2023-01-17: 6 mg via INTRAVITREAL

## 2023-01-23 ENCOUNTER — Other Ambulatory Visit: Payer: Self-pay

## 2023-01-23 ENCOUNTER — Telehealth: Payer: Self-pay

## 2023-01-23 DIAGNOSIS — J302 Other seasonal allergic rhinitis: Secondary | ICD-10-CM

## 2023-01-23 DIAGNOSIS — F339 Major depressive disorder, recurrent, unspecified: Secondary | ICD-10-CM

## 2023-01-23 DIAGNOSIS — T7840XA Allergy, unspecified, initial encounter: Secondary | ICD-10-CM

## 2023-01-23 DIAGNOSIS — L299 Pruritus, unspecified: Secondary | ICD-10-CM

## 2023-01-23 MED ORDER — SERTRALINE HCL 50 MG PO TABS: ORAL_TABLET | ORAL | 1 refills | Status: DC

## 2023-01-23 MED ORDER — MOMETASONE FUROATE 0.1 % EX CREA: TOPICAL_CREAM | Freq: Every day | CUTANEOUS | 2 refills | Status: DC

## 2023-01-23 MED ORDER — LEVOCETIRIZINE DIHYDROCHLORIDE 5 MG PO TABS: ORAL_TABLET | ORAL | 1 refills | Status: DC

## 2023-01-23 NOTE — Telephone Encounter (Signed)
Prescription Request  01/23/2023  LOV: 12/12/22  What is the name of the medication or equipment? levocetirizine (XYZAL) 5 MG tablet [id number]   Have you contacted your pharmacy to request a refill? Yes   Which pharmacy would you like this sent to?  [id number] [place], [place] [address] [place] [address] Phone: [phone] Fax: [phone]    Patient notified that their request is being sent to the clinical staff for review and that they should receive a response within 2 business days.   Please advise at [facility] [phone]

## 2023-01-23 NOTE — Telephone Encounter (Signed)
Prescription Request  01/23/2023  LOV:12/12/22  What is the name of the medication or equipment? mometasone (ELOCON) 0.1 % cream [id number]  Have you contacted your pharmacy to request a refill? Yes   Which pharmacy would you like this sent to?  [id number] [place], [place] [address] [place] [address] Phone: [phone] Fax: [phone]    Patient notified that their request is being sent to the clinical staff for review and that they should receive a response within 2 business days.   Please advise at [facility] [phone]

## 2023-01-23 NOTE — Telephone Encounter (Signed)
Prescription Request  01/23/2023  LOV: 12/12/22  What is the name of the medication or equipment? sertraline (ZOLOFT) 50 MG tablet [id number]  Have you contacted your pharmacy to request a refill? Yes   Which pharmacy would you like this sent to?  [id number] [place], [place] [address] [place] [address] Phone: [phone] Fax: [phone]    Patient notified that their request is being sent to the clinical staff for review and that they should receive a response within 2 business days.   Please advise at [facility] [phone]

## 2023-01-27 ENCOUNTER — Other Ambulatory Visit: Payer: Self-pay

## 2023-01-27 ENCOUNTER — Telehealth: Payer: Self-pay

## 2023-01-27 DIAGNOSIS — I10 Essential (primary) hypertension: Secondary | ICD-10-CM

## 2023-01-27 MED ORDER — TELMISARTAN-HCTZ 80-12.5 MG PO TABS: 1.0000 | ORAL_TABLET | Freq: Every day | ORAL | 1 refills | Status: DC

## 2023-01-27 NOTE — Telephone Encounter (Signed)
Prescription Request  01/27/2023  LOV: 12/12/22  What is the name of the medication or equipment? telmisartan-hydrochlorothiazide (MICARDIS HCT) 80-12.5 MG tablet [id number]  Have you contacted your pharmacy to request a refill? Yes   Which pharmacy would you like this sent to?  [id number] [place], [place] [address] [place] [address] Phone: [phone] Fax: [phone]    Patient notified that their request is being sent to the clinical staff for review and that they should receive a response within 2 business days.   Please advise at [facility] [phone]

## 2023-01-29 ENCOUNTER — Ambulatory Visit (INDEPENDENT_AMBULATORY_CARE_PROVIDER_SITE_OTHER): Payer: Medicare Other | Admitting: Orthopedic Surgery

## 2023-01-29 DIAGNOSIS — M25511 Pain in right shoulder: Secondary | ICD-10-CM

## 2023-01-29 DIAGNOSIS — G8929 Other chronic pain: Secondary | ICD-10-CM

## 2023-02-01 ENCOUNTER — Encounter: Payer: Self-pay | Admitting: Orthopedic Surgery

## 2023-02-01 NOTE — Progress Notes (Signed)
Office Visit Note   Patient: Faith Reeves           Date of Birth: 1951-02-06           MRN: [id number] Visit Date: 01/29/2023 Requested by: Leandrew Koyanagi, [place],  [place] [id number] PCP: Susy Frizzle, MD  Subjective: Chief Complaint  Patient presents with   Right Shoulder - Pain    HPI: Faith Reeves is a 72 y.o. female who presents to the office reporting right shoulder pain.  She underwent MRI scan of the right shoulder 12/26/2022.  That scan showed severe tendinosis of the supraspinatus tendon with complete tear and nearly 4 cm of retraction with severe tendinosis of the infraspinatus tendon as well as large joint effusion with synovitis and tendinosis of the intra-articular portion of the biceps tendon.  She describes decreased range of motion.  She is able to lay on the right-hand side.  Takes gabapentin for pain.  She uses a cane in the right hand for hip replacement but more for balance as opposed to loadbearing.  Had an injection 10 months ago.  She injured her shoulder when she was picking up her son a lot who had an aneurysm.  Does wake her from sleep most nights.  She is right-hand dominant.  Left shoulder and hand is very functional..                ROS: All systems reviewed are negative as they relate to the chief complaint within the history of present illness.  Patient denies fevers or chills.  Assessment & Plan: Visit Diagnoses:  1. Chronic right shoulder pain     Plan: Impression is right shoulder pain with synovitis a component of arthritis as well as retracted and unrepairable rotator cuff tear.  Her most predictable outcome would be with reverse shoulder replacement.  She is having pain as well as limitation of overhead motion and strength.  Only has about 60 degrees of active abduction.  Discussed with the use of models as well as rationale behind reverse shoulder replacement.  The risk and benefits of the procedure including not limited to  infection or vessel damage dislocation as well as incomplete functional relief and incomplete pain relief were all discussed.  Patient understands risk and benefits and wishes to proceed with surgical intervention.  Plan for thin cut CT scan for patient specific instrumentation for the reverse replacement.  We will see her back 2 weeks after surgery.  All questions answered  Follow-Up Instructions: No follow-ups on file.   Orders:  Orders Placed This Encounter  Procedures   CT SHOULDER RIGHT WO CONTRAST   No orders of the defined types were placed in this encounter.     Procedures: No procedures performed   Clinical Data: No additional findings.  Objective: Vital Signs: There were no vitals taken for this visit.  Physical Exam:  Constitutional: Patient appears well-developed HEENT:  Head: Normocephalic Eyes:EOM are normal Neck: Normal range of motion Cardiovascular: Normal rate Pulmonary/chest: Effort normal Neurologic: Patient is alert Skin: Skin is warm Psychiatric: Patient has normal mood and affect  Ortho Exam: Ortho exam demonstrates on the right-hand side active abduction to about 60 degrees.  Passive range of motion is approximately 50/90/160.  Deltoid is functional.  Motor or sensory function to the hand is intact.  No masses lymphadenopathy or skin changes noted in that right shoulder girdle region  Specialty Comments:  No specialty comments available.  Imaging: No results  found.   PMFS History: Patient Active Problem List   Diagnosis Date Noted   Macular degeneration of right eye 12/18/2016   Allergy    Anxiety    Blood transfusion without reported diagnosis    Cataract    Hypertension    Past Medical History:  Diagnosis Date   Allergy    Anemia    Anxiety    Blood transfusion without reported diagnosis    Cataract    GERD (gastroesophageal reflux disease)    Hypertension    Hypertensive retinopathy    OU   Macular degeneration     Family  History  Problem Relation Age of Onset   Hypertension Mother    Cancer Sister    Stroke Maternal Grandmother     Past Surgical History:  Procedure Laterality Date   ABDOMINAL HYSTERECTOMY     ACHILLES TENDON REPAIR Bilateral    APPENDECTOMY     BACK SURGERY     BILATERAL CARPAL TUNNEL RELEASE     CHOLECYSTECTOMY     COLONOSCOPY     EYE SURGERY Right 09/18/2022   cataract extraction   HERNIA REPAIR     JOINT REPLACEMENT     MELANOMA EXCISION Right 09/15/2017   Procedure: EXCISION LIPOSARCOMA RIGHT ARM;  Surgeon: Georganna Skeans, MD;  Location: [place];  Service: General;  Laterality: Right;   SPINE SURGERY     Social History   Occupational History   Not on file  Tobacco Use   Smoking status: Never   Smokeless tobacco: Never  Vaping Use   Vaping Use: Never used  Substance and Sexual Activity   Alcohol use: Yes    Alcohol/week: 1.0 standard drink of alcohol    Types: 1 Glasses of wine per week   Drug use: No   Sexual activity: Never

## 2023-02-21 ENCOUNTER — Telehealth: Payer: Self-pay

## 2023-02-21 ENCOUNTER — Other Ambulatory Visit: Payer: Self-pay | Admitting: Family Medicine

## 2023-02-21 MED ORDER — OXYCODONE-ACETAMINOPHEN 10-325 MG PO TABS: 1.0000 | ORAL_TABLET | ORAL | 0 refills | Status: DC | PRN

## 2023-02-21 NOTE — Telephone Encounter (Signed)
Prescription Request  02/21/2023  LOV: 12/12/22  What is the name of the medication or equipment? oxyCODONE-acetaminophen (PERCOCET) 10-325 MG tablet  Have you contacted your pharmacy to request a refill? No   Which pharmacy would you like this sent to?  [id number] [place], [place] - [address] [place] [address] Phone: [phone] Fax: [phone]    Patient notified that their request is being sent to the clinical staff for review and that they should receive a response within 2 business days.   Please advise at [facility] [phone]

## 2023-02-27 ENCOUNTER — Telehealth: Payer: Self-pay

## 2023-02-27 NOTE — Telephone Encounter (Signed)
Prescription Request  02/27/2023  LOV: 12/12/22  What is the name of the medication or equipment? gabapentin (NEURONTIN) 300 MG capsule [[id number]]  Have you contacted your pharmacy to request a refill? Yes   Which pharmacy would you like this sent to?  [id number] [place], [place] - [address] AT [facility] [address] [facility] [facility] [address] [place] [address] Phone: [phone] Fax: [phone]    Patient notified that their request is being sent to the clinical staff for review and that they should receive a response within 2 business days.   Please advise at [facility] [phone]

## 2023-02-28 ENCOUNTER — Encounter (INDEPENDENT_AMBULATORY_CARE_PROVIDER_SITE_OTHER): Payer: Medicare Other | Admitting: Ophthalmology

## 2023-02-28 ENCOUNTER — Other Ambulatory Visit: Payer: Self-pay | Admitting: Family Medicine

## 2023-02-28 DIAGNOSIS — H25812 Combined forms of age-related cataract, left eye: Secondary | ICD-10-CM

## 2023-02-28 DIAGNOSIS — M4120 Other idiopathic scoliosis, site unspecified: Secondary | ICD-10-CM

## 2023-02-28 DIAGNOSIS — H35033 Hypertensive retinopathy, bilateral: Secondary | ICD-10-CM

## 2023-02-28 DIAGNOSIS — Z961 Presence of intraocular lens: Secondary | ICD-10-CM

## 2023-02-28 DIAGNOSIS — I10 Essential (primary) hypertension: Secondary | ICD-10-CM

## 2023-02-28 DIAGNOSIS — H40003 Preglaucoma, unspecified, bilateral: Secondary | ICD-10-CM

## 2023-02-28 DIAGNOSIS — H04123 Dry eye syndrome of bilateral lacrimal glands: Secondary | ICD-10-CM

## 2023-02-28 DIAGNOSIS — H353122 Nonexudative age-related macular degeneration, left eye, intermediate dry stage: Secondary | ICD-10-CM

## 2023-02-28 DIAGNOSIS — H353211 Exudative age-related macular degeneration, right eye, with active choroidal neovascularization: Secondary | ICD-10-CM

## 2023-02-28 MED ORDER — GABAPENTIN 300 MG PO CAPS
ORAL_CAPSULE | ORAL | 5 refills | Status: DC
Start: 2023-02-28 — End: 2023-10-22

## 2023-02-28 NOTE — Progress Notes (Signed)
[facility] - Clinic Note  03/05/2023   CHIEF COMPLAINT Patient presents for Retina Follow Up  HISTORY OF PRESENT ILLNESS: Faith Reeves is a 72 y.o. female who presents to the clinic today for:   HPI     Retina Follow Up   Patient presents with  Wet AMD.  In right eye.  This started months ago.  Severity is moderate.  Duration of 7 weeks.  Since onset it is stable.  I, the attending physician,  performed the HPI with the patient and updated documentation appropriately.        Comments   Patient states that she sees a black spot in the right eye. She feels that the vision is stable. She is not using any eye drops at this time.       Last edited by Rennis Chris, MD on 03/05/2023  5:19 PM.     Referring physician: Donita Brooks, MD [id number] [facility] [address] [place],  [place] [address]  HISTORICAL INFORMATION:   Selected notes from the MEDICAL RECORD NUMBER Referred by Dr. Baker Pierini for concern of exu ARMD OU   CURRENT MEDICATIONS: No current outpatient medications on file. (Ophthalmic Drugs)   No current facility-administered medications for this visit. (Ophthalmic Drugs)   Current Outpatient Medications (Other)  Medication Sig   albuterol (PROVENTIL HFA;VENTOLIN HFA) 108 (90 Base) MCG/ACT inhaler Inhale 2 puffs into the lungs every 4 (four) hours as needed for wheezing or shortness of breath.   ALPRAZolam (XANAX) 0.5 MG tablet Take 1 tablet (0.5 mg total) by mouth 3 (three) times daily as needed for anxiety.   celecoxib (CELEBREX) 200 MG capsule TAKE 1 CAPSULE BY MOUTH EVERY DAY   gabapentin (NEURONTIN) 300 MG capsule Take 1 capsule by mouth three times a day.   ipratropium (ATROVENT) 0.06 % nasal spray SPRAY 2 SPRAYS INTO EACH NOSTRIL 4 TIMES A DAY.   levocetirizine (XYZAL) 5 MG tablet TAKE 1 TABLET BY MOUTH EVERY DAY IN THE EVENING   methocarbamol (ROBAXIN) 500 MG tablet Take 1 tablet (500 mg total) by mouth 2 (two) times daily as needed for muscle  spasms.   mometasone (ELOCON) 0.1 % cream Apply topically daily.   oxyCODONE-acetaminophen (PERCOCET) 10-325 MG tablet Take 1 tablet by mouth every 4 (four) hours as needed for pain (G89.4).   pantoprazole (PROTONIX) 40 MG tablet TAKE 1 TABLET BY MOUTH EVERY DAY   predniSONE (STERAPRED UNI-PAK 21 TAB) 10 MG (21) TBPK tablet Take as directed   sertraline (ZOLOFT) 50 MG tablet TAKE 1 AND 1/2 TABLETS DAILY BY MOUTH   telmisartan-hydrochlorothiazide (MICARDIS HCT) 80-12.5 MG tablet Take 1 tablet by mouth daily.   valACYclovir (VALTREX) 1000 MG tablet Take 2 tablets (2,000 mg total) by mouth 2 (two) times daily.   zolpidem (AMBIEN) 10 MG tablet TAKE 1 TABLET BY MOUTH EVERY DAY AT BEDTIME AS NEEDED FOR SLEEP   No current facility-administered medications for this visit. (Other)   REVIEW OF SYSTEMS: ROS   Positive for: Gastrointestinal, Neurological, Eyes Negative for: Constitutional, Skin, Genitourinary, Musculoskeletal, HENT, Endocrine, Cardiovascular, Respiratory, Psychiatric, Allergic/Imm, Heme/Lymph Last edited by Julieanne Cotton, COT on 03/05/2023  1:23 PM.      ALLERGIES Allergies  Allergen Reactions   Cymbalta [Duloxetine Hcl] Swelling   PAST MEDICAL HISTORY Past Medical History:  Diagnosis Date   Allergy    Anemia    Anxiety    Blood transfusion without reported diagnosis    Cataract  GERD (gastroesophageal reflux disease)    Hypertension    Hypertensive retinopathy    OU   Macular degeneration    Past Surgical History:  Procedure Laterality Date   ABDOMINAL HYSTERECTOMY     ACHILLES TENDON REPAIR Bilateral    APPENDECTOMY     BACK SURGERY     BILATERAL CARPAL TUNNEL RELEASE     CHOLECYSTECTOMY     COLONOSCOPY     EYE SURGERY Right 09/18/2022   cataract extraction   HERNIA REPAIR     JOINT REPLACEMENT     MELANOMA EXCISION Right 09/15/2017   Procedure: EXCISION LIPOSARCOMA RIGHT ARM;  Surgeon: Violeta Gelinas, MD;  Location: [facility];  Service: General;   Laterality: Right;   SPINE SURGERY     FAMILY HISTORY Family History  Problem Relation Age of Onset   Hypertension Mother    Cancer Sister    Stroke Maternal Grandmother    SOCIAL HISTORY Social History   Tobacco Use   Smoking status: Never   Smokeless tobacco: Never  Vaping Use   Vaping Use: Never used  Substance Use Topics   Alcohol use: Yes    Alcohol/week: 1.0 standard drink of alcohol    Types: 1 Glasses of wine per week   Drug use: No       OPHTHALMIC EXAM: Base Eye Exam     Visual Acuity (Snellen - Linear)       Right Left   Dist cc 20/30 +1 20/20 +2   Dist ph cc NI     Correction: Glasses         Tonometry (Tonopen, 1:26 PM)       Right Left   Pressure 12 10         Pupils       Dark Light Shape React APD   Right 3 2 Round Brisk None   Left 3 2 Round Brisk None         Visual Fields       Left Right    Full Full         Extraocular Movement       Right Left    Full, Ortho Full, Ortho         Neuro/Psych     Oriented x3: Yes   Mood/Affect: Normal         Dilation     Both eyes: 1.0% Mydriacyl, 2.5% Phenylephrine @ 1:24 PM           Slit Lamp and Fundus Exam     Slit Lamp Exam       Right Left   Lids/Lashes Dermatochalasis - upper lid, mild Meibomian gland dysfunction Dermatochalasis - upper lid, mild Meibomian gland dysfunction   Conjunctiva/Sclera White and quiet White and quiet   Cornea 2-3+ fine Punctate epithelial erosions 2+ fine Punctate epithelial erosions inferiorly   Anterior Chamber deep, clear, narrow temporal angle deep, clear, narrow temporal angle   Iris Round and dilated Round and dilated   Lens PCIOL in good position 2-3+ Nuclear sclerosis, 2-3+ Cortical cataract   Anterior Vitreous Vitreous syneresis, Posterior vitreous detachment, prominent vitreous condensation now nasal to disc Vitreous syneresis, Posterior vitreous detachment         Fundus Exam       Right Left   Disc Pink and  Sharp, Compact, +PPA Pink and Sharp, temporal PPP, Compact   C/D Ratio 0.2 0.1   Macula Blunted foveal reflex, drusen, RPE mottling, clumping and atrophy, +CNV,  interval improvement in central SRF -- resolved, No heme Flat, good foveal reflex, scattered Drusen, RPE mottling and clumping, focal PEDs, No heme or edema   Vessels mild attenuation, mild tortuosity attenuated, Tortuous   Periphery Attached, mild reticular degeneration, no heme Attached, no heme           Refraction     Wearing Rx       Sphere Cylinder Axis Add   Right +2.00 +0.50 008 +2.50   Left +2.50 +1.00 154 +2.50    Type: PAL           IMAGING AND PROCEDURES  Imaging and Procedures for @TODAY @  OCT, Retina - OU - Both Eyes       Right Eye Quality was good. Central Foveal Thickness: 206. Progression has improved. Findings include normal foveal contour, no IRF, no SRF, retinal drusen , pigment epithelial detachment, outer retinal atrophy (interval improvement in central SRF overlying low, stable PEDs).   Left Eye Quality was good. Central Foveal Thickness: 273. Progression has been stable. Findings include normal foveal contour, no IRF, no SRF, retinal drusen .   Notes *Images captured and stored on drive  Diagnosis / Impression:  OD: interval improvement in central SRF overlying low, stable PEDs OS: non-exu ARMD -- stable  Clinical management:  See below  Abbreviations: NFP - Normal foveal profile. CME - cystoid macular edema. PED - pigment epithelial detachment. IRF - intraretinal fluid. SRF - subretinal fluid. EZ - ellipsoid zone. ERM - epiretinal membrane. ORA - outer retinal atrophy. ORT - outer retinal tubulation. SRHM - subretinal hyper-reflective material     Intravitreal Injection, Pharmacologic Agent - OD - Right Eye       Time Out 03/05/2023. 2:04 PM. Confirmed correct patient, procedure, site, and patient consented.   Anesthesia Topical anesthesia was used. Anesthetic medications  included Lidocaine 2%, Proparacaine 0.5%.   Procedure Preparation included 5% betadine to ocular surface, eyelid speculum. A (32g) needle was used.   Injection: 6 mg faricimab-svoa 6 MG/0.05ML   Route: Intravitreal, Site: Right Eye   NDC: [phone], Lot: [id number], Expiration date: 02/01/2025, Waste: 0 mL   Post-op Post injection exam found visual acuity of at least counting fingers. The patient tolerated the procedure well. There were no complications. The patient received written and verbal post procedure care education. Post injection medications were not given.            ASSESSMENT/PLAN:    ICD-10-CM   1. Exudative age-related macular degeneration of right eye with active choroidal neovascularization (HCC)  H35.3211 OCT, Retina - OU - Both Eyes    Intravitreal Injection, Pharmacologic Agent - OD - Right Eye    faricimab-svoa (VABYSMO) 6mg /0.31mL intravitreal injection    2. Intermediate stage nonexudative age-related macular degeneration of left eye  H35.3122     3. Essential hypertension  I10     4. Hypertensive retinopathy of both eyes  H35.033     5. Combined forms of age-related cataract of left eye  H25.812     6. Pseudophakia  Z96.1     7. Glaucoma suspect of both eyes  H40.003     8. Dry eyes  H04.123      1. Exudative age related macular degeneration, right eye  - FA (03.10.20) shows +CNV w/ staining/leakage  - repeat FA 09.13.21 shows interval increase in perifoveal leakage OD - s/p IVA OD #1 (03.10.20), #2 (04.13.20), #3 (05.11.20), #4 (06.23.20), #5 (8.18.20), #6 (10.27.20), #7 (01.26.21), #8 (04.26.21), #9 (08.02.21), #  10 (09.13.21), #11 (10.11.21), #12 (11.9.21), #13 (12.14.21), #14 (1.24.22), #15 (3.15.22), #16 (05.24.22), #17 (8.9.22), #18 (10.18.22), #19 (01.03.23), #20 (03.29.23) -- IVA resistance - s/p IVE OD #1 (06.07.23), #2 (07.19.23), #3 (09.01.23), #4 (10.13.23), #5 (11.10.23), #6 (12.22.23) -- IVE resistane  - s/p IVV OD #1 (02.02.24), #2  (03.15.24) - pt was doing very well with q3 mo maintenance injections, but then developed interval increases in central SRF over several visits -- IVA resistance  - BCVA OD 20/30 -- stable - OCT today interval improvement in central SRF overlying low, stable PEDs at 6 weeks   - recommend IVV OD #3 today, 05.01.24 w/ f/u in 6 wks  - pt wishes to be treated with IVV OD  - RBA of procedure discussed, questions answered  - IVE informed consent obtained and signed, 06.07.23 (OD)  - IVV informed consent obtained and signed, 02.02.24 (OD)  - see procedure note  - f/u in 6 wks -- DFE/OCT/possible injection  2. Age related macular degeneration, non-exudative, left eye - The incidence, anatomy, and pathology of dry AMD, risk of progression, and the AREDS and AREDS 2 study including smoking risks discussed with patient.  - intermediate stage  - recommend amsler grid monitoring  3,4. Hypertensive retinopathy OU  - discussed importance of tight BP control  - monitor  5. Mixed form age related cataracts OS - The symptoms of cataract, surgical options, and treatments and risks were discussed with patient  - under the expert management of Dr. Zenaida Niece  6. Pseudophakia OD  - s/p CE/IOL (Dr. Zenaida Niece, 11.15.23)  - IOL in good position, doing well  - monitor  7. Glaucoma Suspect OU  - IOP 10 OU  - under the expert management of Dr. Zenaida Niece  8. Dry eyes OU  - decreased vision OD today mostly due to increased PEE / dry eyes  - pt reports stopping ATs after hearing about product recalls on ATs - recommend artificial tears and lubricating ointment as needed   Ophthalmic Meds Ordered this visit:  Meds ordered this encounter  Medications   faricimab-svoa (VABYSMO) 6mg /0.66mL intravitreal injection    This document serves as a record of services personally performed by Karie Chimera, MD, PhD. It was created on their behalf by De Blanch, an ophthalmic technician. The creation of this record is the  provider's dictation and/or activities during the visit.    Electronically signed by: De Blanch, OA, 03/07/23  1:54 PM  This document serves as a record of services personally performed by Karie Chimera, MD, PhD. It was created on their behalf by Glee Arvin. Manson Passey, OA an ophthalmic technician. The creation of this record is the provider's dictation and/or activities during the visit.    Electronically signed by: Glee Arvin. [place], [place] 05.01.2024 1:54 PM   Karie Chimera, M.D., Ph.D. Diseases & Surgery of the Retina and Vitreous [facility]  I have reviewed the above documentation for accuracy and completeness, and I agree with the above. Karie Chimera, M.D., Ph.D. 03/07/23 1:56 PM   Abbreviations: M myopia (nearsighted); A astigmatism; H hyperopia (farsighted); P presbyopia; Mrx spectacle prescription;  CTL contact lenses; OD right eye; OS left eye; OU both eyes  XT exotropia; ET esotropia; PEK punctate epithelial keratitis; PEE punctate epithelial erosions; DES dry eye syndrome; MGD meibomian gland dysfunction; ATs artificial tears; PFAT's preservative free artificial tears; NSC nuclear sclerotic cataract; PSC posterior subcapsular cataract; ERM epi-retinal membrane; PVD posterior vitreous detachment; RD retinal  detachment; DM diabetes mellitus; DR diabetic retinopathy; NPDR non-proliferative diabetic retinopathy; PDR proliferative diabetic retinopathy; CSME clinically significant macular edema; DME diabetic macular edema; dbh dot blot hemorrhages; CWS cotton wool spot; POAG primary open angle glaucoma; C/D cup-to-disc ratio; HVF humphrey visual field; GVF goldmann visual field; OCT optical coherence tomography; IOP intraocular pressure; BRVO Branch retinal vein occlusion; CRVO central retinal vein occlusion; CRAO central retinal artery occlusion; BRAO branch retinal artery occlusion; RT retinal tear; SB scleral buckle; PPV pars plana vitrectomy; VH Vitreous  hemorrhage; PRP panretinal laser photocoagulation; IVK intravitreal kenalog; VMT vitreomacular traction; MH Macular hole;  NVD neovascularization of the disc; NVE neovascularization elsewhere; AREDS age related eye disease study; ARMD age related macular degeneration; POAG primary open angle glaucoma; EBMD epithelial/anterior basement membrane dystrophy; ACIOL anterior chamber intraocular lens; IOL intraocular lens; PCIOL posterior chamber intraocular lens; Phaco/IOL phacoemulsification with intraocular lens placement; PRK photorefractive keratectomy; LASIK laser assisted in situ keratomileusis; HTN hypertension; DM diabetes mellitus; COPD chronic obstructive pulmonary disease

## 2023-03-03 ENCOUNTER — Ambulatory Visit
Admission: RE | Admit: 2023-03-03 | Discharge: 2023-03-03 | Disposition: A | Discharge status: Home / Self Care | Payer: Medicare Other | Source: Ambulatory Visit | Attending: Orthopedic Surgery | Admitting: Orthopedic Surgery

## 2023-03-03 DIAGNOSIS — G8929 Other chronic pain: Secondary | ICD-10-CM

## 2023-03-03 DIAGNOSIS — M25511 Pain in right shoulder: Secondary | ICD-10-CM | POA: Diagnosis not present

## 2023-03-05 ENCOUNTER — Ambulatory Visit (INDEPENDENT_AMBULATORY_CARE_PROVIDER_SITE_OTHER): Payer: Medicare Other | Admitting: Ophthalmology

## 2023-03-05 ENCOUNTER — Encounter (INDEPENDENT_AMBULATORY_CARE_PROVIDER_SITE_OTHER): Payer: Self-pay | Admitting: Ophthalmology

## 2023-03-05 DIAGNOSIS — H353122 Nonexudative age-related macular degeneration, left eye, intermediate dry stage: Secondary | ICD-10-CM

## 2023-03-05 DIAGNOSIS — H35033 Hypertensive retinopathy, bilateral: Secondary | ICD-10-CM

## 2023-03-05 DIAGNOSIS — H25812 Combined forms of age-related cataract, left eye: Secondary | ICD-10-CM | POA: Diagnosis not present

## 2023-03-05 DIAGNOSIS — H04123 Dry eye syndrome of bilateral lacrimal glands: Secondary | ICD-10-CM | POA: Diagnosis not present

## 2023-03-05 DIAGNOSIS — I10 Essential (primary) hypertension: Secondary | ICD-10-CM

## 2023-03-05 DIAGNOSIS — Z961 Presence of intraocular lens: Secondary | ICD-10-CM | POA: Diagnosis not present

## 2023-03-05 DIAGNOSIS — H40003 Preglaucoma, unspecified, bilateral: Secondary | ICD-10-CM | POA: Diagnosis not present

## 2023-03-05 DIAGNOSIS — H353211 Exudative age-related macular degeneration, right eye, with active choroidal neovascularization: Secondary | ICD-10-CM

## 2023-03-05 MED ORDER — FARICIMAB-SVOA 6 MG/0.05ML IZ SOLN
6.0000 mg | INTRAVITREAL | Status: AC | PRN
Start: 2023-03-05 — End: 2023-03-05
  Administered 2023-03-05: 6 mg via INTRAVITREAL

## 2023-04-01 ENCOUNTER — Other Ambulatory Visit: Payer: Self-pay | Admitting: Family Medicine

## 2023-04-01 NOTE — Telephone Encounter (Signed)
Prescription Request  04/01/2023  LOV: 12/12/2022  What is the name of the medication or equipment? pantoprazole (PROTONIX) 40 MG tablet   Have you contacted your pharmacy to request a refill? Yes   Which pharmacy would you like this sent to?  [id number] [place], [place] - [address] [facility] [address] [place] [address] Phone: [phone] Fax: [phone]    Patient notified that their request is being sent to the clinical staff for review and that they should receive a response within 2 business days.   Please advise at [facility] [phone]

## 2023-04-02 MED ORDER — PANTOPRAZOLE SODIUM 40 MG PO TBEC: 40.0000 mg | DELAYED_RELEASE_TABLET | Freq: Every day | ORAL | 0 refills | Status: DC

## 2023-04-02 NOTE — Telephone Encounter (Signed)
OV 12/12/22 Requested Prescriptions  Pending Prescriptions Disp Refills   pantoprazole (PROTONIX) 40 MG tablet 90 tablet 3    Sig: Take 1 tablet (40 mg total) by mouth daily.     Gastroenterology: Proton Pump Inhibitors Failed - 04/01/2023  1:46 PM      Failed - Valid encounter within last 12 months    Recent Outpatient Visits           1 year ago Benign essential HTN   [facility] Tanya Nones Priscille Heidelberg, MD   2 years ago Benign essential HTN   [facility] Tanya Nones Priscille Heidelberg, MD   2 years ago Benign essential HTN   [facility] Donita Brooks, MD   3 years ago Acute idiopathic gout of right wrist   [facility] Pickard, Priscille Heidelberg, MD   3 years ago General medical exam   [facility] Pickard, Priscille Heidelberg, MD

## 2023-04-03 ENCOUNTER — Other Ambulatory Visit: Payer: Self-pay | Admitting: Family Medicine

## 2023-04-03 DIAGNOSIS — T7840XD Allergy, unspecified, subsequent encounter: Secondary | ICD-10-CM

## 2023-04-03 DIAGNOSIS — L299 Pruritus, unspecified: Secondary | ICD-10-CM

## 2023-04-03 NOTE — Progress Notes (Signed)
[facility] - Clinic Note  04/09/2023   CHIEF COMPLAINT Patient presents for Retina Follow Up  HISTORY OF PRESENT ILLNESS: Faith Reeves is a 72 y.o. female who presents to the clinic today for:   HPI     Retina Follow Up   Patient presents with  Wet AMD.  In right eye.  This started 5 weeks ago.  I, the attending physician,  performed the HPI with the patient and updated documentation appropriately.        Comments   Patient here for 5 weeks retina follow up for exu ARMD OD. Patient states vision OD a little blurry not terrible. Having right shoulder replacement next week Tuesday. Very nervous about it.       Last edited by Rennis Chris, MD on 04/09/2023  5:07 PM.     Patient states that she is having shoulder surgery next week 04/15/23.   Referring physician: Donita Brooks, MD [id number] [facility] [address] [place],  [place] [address]  HISTORICAL INFORMATION:   Selected notes from the MEDICAL RECORD NUMBER Referred by Dr. Baker Pierini for concern of exu ARMD OU   CURRENT MEDICATIONS: No current outpatient medications on file. (Ophthalmic Drugs)   No current facility-administered medications for this visit. (Ophthalmic Drugs)   Current Outpatient Medications (Other)  Medication Sig   albuterol (PROVENTIL HFA;VENTOLIN HFA) 108 (90 Base) MCG/ACT inhaler Inhale 2 puffs into the lungs every 4 (four) hours as needed for wheezing or shortness of breath.   ALPRAZolam (XANAX) 0.5 MG tablet Take 1 tablet (0.5 mg total) by mouth 3 (three) times daily as needed for anxiety.   celecoxib (CELEBREX) 200 MG capsule TAKE 1 CAPSULE BY MOUTH EVERY DAY (Patient taking differently: Take 200 mg by mouth every other day.)   gabapentin (NEURONTIN) 300 MG capsule Take 1 capsule by mouth three times a day.   ipratropium (ATROVENT) 0.06 % nasal spray SPRAY 2 SPRAYS INTO EACH NOSTRIL 4 TIMES A DAY.   levocetirizine (XYZAL) 5 MG tablet TAKE 1 TABLET BY MOUTH EVERY DAY IN THE EVENING    methocarbamol (ROBAXIN) 500 MG tablet Take 1 tablet (500 mg total) by mouth 2 (two) times daily as needed for muscle spasms.   mometasone (ELOCON) 0.1 % cream APPLY TO AFFECTED AREA TOPICALLY EVERY DAY   Multiple Vitamins-Minerals (EQ VISION FORMULA 50+ PO) Take 1 capsule by mouth daily.   oxyCODONE-acetaminophen (PERCOCET) 10-325 MG tablet Take 1 tablet by mouth every 4 (four) hours as needed for pain (G89.4). (Patient taking differently: Take 1 tablet by mouth at bedtime.)   pantoprazole (PROTONIX) 40 MG tablet Take 1 tablet (40 mg total) by mouth daily.   sertraline (ZOLOFT) 50 MG tablet TAKE 1 AND 1/2 TABLETS DAILY BY MOUTH   telmisartan-hydrochlorothiazide (MICARDIS HCT) 80-12.5 MG tablet Take 1 tablet by mouth daily.   valACYclovir (VALTREX) 1000 MG tablet Take 2 tablets (2,000 mg total) by mouth 2 (two) times daily.   zolpidem (AMBIEN) 10 MG tablet TAKE 1 TABLET BY MOUTH EVERY DAY AT BEDTIME AS NEEDED FOR SLEEP (Patient taking differently: Take 10 mg by mouth at bedtime.)   predniSONE (STERAPRED UNI-PAK 21 TAB) 10 MG (21) TBPK tablet Take as directed (Patient not taking: Reported on 04/04/2023)   No current facility-administered medications for this visit. (Other)   REVIEW OF SYSTEMS: ROS   Positive for: Gastrointestinal, Neurological, Eyes Negative for: Constitutional, Skin, Genitourinary, Musculoskeletal, HENT, Endocrine, Cardiovascular, Respiratory, Psychiatric, Allergic/Imm, Heme/Lymph Last edited by Betsey Holiday  S, COA on 04/09/2023  2:02 PM.       ALLERGIES Allergies  Allergen Reactions   Cymbalta [Duloxetine Hcl] Swelling   PAST MEDICAL HISTORY Past Medical History:  Diagnosis Date   Allergy    Anemia    Anxiety    Arthritis    Blood transfusion without reported diagnosis    Cataract    GERD (gastroesophageal reflux disease)    Hypertension    Hypertensive retinopathy    OU   Macular degeneration    Past Surgical History:  Procedure Laterality Date    ABDOMINAL HYSTERECTOMY     ACHILLES TENDON REPAIR Bilateral    APPENDECTOMY     BACK SURGERY     BILATERAL CARPAL TUNNEL RELEASE     CHOLECYSTECTOMY     COLONOSCOPY     EYE SURGERY Right 09/18/2022   cataract extraction   HERNIA REPAIR     JOINT REPLACEMENT     MELANOMA EXCISION Right 09/15/2017   Procedure: EXCISION LIPOSARCOMA RIGHT ARM;  Surgeon: Violeta Gelinas, MD;  Location: [facility] OR;  Service: General;  Laterality: Right;   SPINE SURGERY     FAMILY HISTORY Family History  Problem Relation Age of Onset   Hypertension Mother    Cancer Sister    Stroke Maternal Grandmother    SOCIAL HISTORY Social History   Tobacco Use   Smoking status: Never   Smokeless tobacco: Never  Vaping Use   Vaping Use: Never used  Substance Use Topics   Alcohol use: Yes    Alcohol/week: 1.0 standard drink of alcohol    Types: 1 Glasses of wine per week   Drug use: No       OPHTHALMIC EXAM: Base Eye Exam     Visual Acuity (Snellen - Linear)       Right Left   Dist cc 20/40 -2 20/20 -2   Dist ph cc 20/30 -2     Correction: Glasses         Tonometry (Tonopen, 1:59 PM)       Right Left   Pressure 15 16         Pupils       Dark Light Shape React APD   Right 3 2 Round Brisk None   Left 3 2 Round Brisk None         Visual Fields (Counting fingers)       Left Right    Full Full         Extraocular Movement       Right Left    Full, Ortho Full, Ortho         Neuro/Psych     Oriented x3: Yes   Mood/Affect: Normal         Dilation     Both eyes: 1.0% Mydriacyl, 2.5% Phenylephrine @ 1:59 PM           Slit Lamp and Fundus Exam     Slit Lamp Exam       Right Left   Lids/Lashes Dermatochalasis - upper lid, mild Meibomian gland dysfunction Dermatochalasis - upper lid, mild Meibomian gland dysfunction   Conjunctiva/Sclera White and quiet White and quiet   Cornea 2-3+ fine Punctate epithelial erosions 2+ fine Punctate epithelial erosions inferiorly    Anterior Chamber deep, clear, narrow temporal angle deep, clear, narrow temporal angle   Iris Round and dilated Round and dilated   Lens PCIOL in good position 2-3+ Nuclear sclerosis, 2-3+ Cortical cataract   Anterior Vitreous  Vitreous syneresis, Posterior vitreous detachment, prominent vitreous condensation now nasal to disc Vitreous syneresis, Posterior vitreous detachment         Fundus Exam       Right Left   Disc Pink and Sharp, Compact, +PPA Pink and Sharp, temporal PPP, Compact   C/D Ratio 0.2 0.1   Macula Blunted foveal reflex, drusen, RPE mottling, clumping and atrophy, +CNV, interval improvement in central SRF -- stable resolution, No heme Flat, good foveal reflex, scattered Drusen, RPE mottling and clumping, focal PEDs, No heme or edema   Vessels mild attenuation, mild tortuosity attenuated, Tortuous   Periphery Attached, mild reticular degeneration, no heme Attached, no heme           Refraction     Wearing Rx       Sphere Cylinder Axis Add   Right +2.00 +0.50 008 +2.50   Left +2.50 +1.00 154 +2.50    Type: PAL           IMAGING AND PROCEDURES  Imaging and Procedures for @TODAY @  OCT, Retina - OU - Both Eyes       Right Eye Quality was good. Central Foveal Thickness: 222. Progression has been stable. Findings include normal foveal contour, no IRF, no SRF, retinal drusen , pigment epithelial detachment, outer retinal atrophy (Stable improvement in central SRF overlying low, stable PEDs).   Left Eye Quality was good. Central Foveal Thickness: 269. Progression has been stable. Findings include normal foveal contour, no IRF, no SRF, retinal drusen .   Notes *Images captured and stored on drive  Diagnosis / Impression:  OD: stable improvement in central SRF overlying low, stable PEDs OS: non-exu ARMD -- stable  Clinical management:  See below  Abbreviations: NFP - Normal foveal profile. CME - cystoid macular edema. PED - pigment epithelial  detachment. IRF - intraretinal fluid. SRF - subretinal fluid. EZ - ellipsoid zone. ERM - epiretinal membrane. ORA - outer retinal atrophy. ORT - outer retinal tubulation. SRHM - subretinal hyper-reflective material     Intravitreal Injection, Pharmacologic Agent - OD - Right Eye       Time Out 04/09/2023. 2:21 PM. Confirmed correct patient, procedure, site, and patient consented.   Anesthesia Topical anesthesia was used. Anesthetic medications included Lidocaine 2%, Proparacaine 0.5%.   Procedure Preparation included 5% betadine to ocular surface, eyelid speculum. A (32g) needle was used.   Injection: 6 mg faricimab-svoa 6 MG/0.05ML   Route: Intravitreal, Site: Right Eye   NDC: O8010301, Lot: [id number], Expiration date: 02/01/2025, Waste: 0 mL   Post-op Post injection exam found visual acuity of at least counting fingers. The patient tolerated the procedure well. There were no complications. The patient received written and verbal post procedure care education. Post injection medications were not given.            ASSESSMENT/PLAN:    ICD-10-CM   1. Exudative age-related macular degeneration of right eye with active choroidal neovascularization (HCC)  H35.3211 OCT, Retina - OU - Both Eyes    Intravitreal Injection, Pharmacologic Agent - OD - Right Eye    faricimab-svoa (VABYSMO) 6mg /0.21mL intravitreal injection    2. Intermediate stage nonexudative age-related macular degeneration of left eye  H35.3122     3. Essential hypertension  I10     4. Hypertensive retinopathy of both eyes  H35.033     5. Combined forms of age-related cataract of left eye  H25.812     6. Pseudophakia  Z96.1     7. Glaucoma  suspect of both eyes  H40.003     8. Dry eyes  H04.123      1. Exudative age related macular degeneration, right eye  - FA (03.10.20) shows +CNV w/ staining/leakage  - repeat FA 09.13.21 shows interval increase in perifoveal leakage OD - s/p IVA OD #1 (03.10.20), #2  (04.13.20), #3 (05.11.20), #4 (06.23.20), #5 (8.18.20), #6 (10.27.20), #7 (01.26.21), #8 (04.26.21), #9 (08.02.21), #10 (09.13.21), #11 (10.11.21), #12 (11.9.21), #13 (12.14.21), #14 (1.24.22), #15 (3.15.22), #16 (05.24.22), #17 (8.9.22), #18 (10.18.22), #19 (01.03.23), #20 (03.29.23) -- IVA resistance ========================================================= - s/p IVE OD #1 (06.07.23), #2 (07.19.23), #3 (09.01.23), #4 (10.13.23), #5 (11.10.23), #6 (12.22.23) -- IVE resistance =========================================================  - s/p IVV OD #1 (02.02.24), #2 (03.15.24), #3 (05.01.24) - pt was doing very well with q3 mo maintenance injections, but then developed interval increases in central SRF over several visits -- IVA resistance  - BCVA OD 20/30 -- stable - OCT today stable improvement in central SRF overlying low, stable PEDs at 5 weeks   - recommend IVV OD #4 today, 06.05.24 w/ f/u in 6 wks  - pt wishes to be treated with IVV OD  - RBA of procedure discussed, questions answered  - IVE informed consent obtained and signed, 06.07.23 (OD)  - IVV informed consent obtained and signed, 02.02.24 (OD)  - see procedure note  - f/u in 6 wks -- DFE/OCT/possible injection  2. Age related macular degeneration, non-exudative, left eye - The incidence, anatomy, and pathology of dry AMD, risk of progression, and the AREDS and AREDS 2 study including smoking risks discussed with patient.  - intermediate stage  - recommend amsler grid monitoring  3,4. Hypertensive retinopathy OU  - discussed importance of tight BP control  - monitor  5. Mixed form age related cataracts OS - The symptoms of cataract, surgical options, and treatments and risks were discussed with patient  - under the expert management of Dr. Zenaida Niece  6. Pseudophakia OD  - s/p CE/IOL (Dr. Zenaida Niece, 11.15.23)  - IOL in good position, doing well  - monitor  7. Glaucoma Suspect OU  - IOP 15,16  - under the expert management of Dr.  Zenaida Niece  8. Dry eyes OU  - decreased vision OD today mostly due to increased PEE / dry eyes  - pt reports stopping ATs after hearing about product recalls on ATs - recommend artificial tears and lubricating ointment as needed   Ophthalmic Meds Ordered this visit:  Meds ordered this encounter  Medications   faricimab-svoa (VABYSMO) 6mg /0.56mL intravitreal injection    This document serves as a record of services personally performed by Karie Chimera, MD, PhD. It was created on their behalf by De Blanch, an ophthalmic technician. The creation of this record is the provider's dictation and/or activities during the visit.    Electronically signed by: De Blanch, OA, 04/09/23  5:08 PM  This document serves as a record of services personally performed by Karie Chimera, MD, PhD. It was created on their behalf by Gerilyn Nestle, COT an ophthalmic technician. The creation of this record is the provider's dictation and/or activities during the visit.    Electronically signed by:  Gerilyn Nestle, COT 6.5.24 5:08 PM  Karie Chimera, M.D., Ph.D. Diseases & Surgery of the Retina and Vitreous [facility]  I have reviewed the above documentation for accuracy and completeness, and I agree with the above. Karie Chimera, M.D., Ph.D. 04/09/23 5:08 PM   Abbreviations: Judie Petit  myopia (nearsighted); A astigmatism; H hyperopia (farsighted); P presbyopia; Mrx spectacle prescription;  CTL contact lenses; OD right eye; OS left eye; OU both eyes  XT exotropia; ET esotropia; PEK punctate epithelial keratitis; PEE punctate epithelial erosions; DES dry eye syndrome; MGD meibomian gland dysfunction; ATs artificial tears; PFAT's preservative free artificial tears; NSC nuclear sclerotic cataract; PSC posterior subcapsular cataract; ERM epi-retinal membrane; PVD posterior vitreous detachment; RD retinal detachment; DM diabetes mellitus; DR diabetic retinopathy; NPDR  non-proliferative diabetic retinopathy; PDR proliferative diabetic retinopathy; CSME clinically significant macular edema; DME diabetic macular edema; dbh dot blot hemorrhages; CWS cotton wool spot; POAG primary open angle glaucoma; C/D cup-to-disc ratio; HVF humphrey visual field; GVF goldmann visual field; OCT optical coherence tomography; IOP intraocular pressure; BRVO Branch retinal vein occlusion; CRVO central retinal vein occlusion; CRAO central retinal artery occlusion; BRAO branch retinal artery occlusion; RT retinal tear; SB scleral buckle; PPV pars plana vitrectomy; VH Vitreous hemorrhage; PRP panretinal laser photocoagulation; IVK intravitreal kenalog; VMT vitreomacular traction; MH Macular hole;  NVD neovascularization of the disc; NVE neovascularization elsewhere; AREDS age related eye disease study; ARMD age related macular degeneration; POAG primary open angle glaucoma; EBMD epithelial/anterior basement membrane dystrophy; ACIOL anterior chamber intraocular lens; IOL intraocular lens; PCIOL posterior chamber intraocular lens; Phaco/IOL phacoemulsification with intraocular lens placement; PRK photorefractive keratectomy; LASIK laser assisted in situ keratomileusis; HTN hypertension; DM diabetes mellitus; COPD chronic obstructive pulmonary disease

## 2023-04-04 ENCOUNTER — Telehealth: Payer: Self-pay | Admitting: Family Medicine

## 2023-04-04 NOTE — Telephone Encounter (Signed)
Prescription Request  04/04/2023  LOV: 12/12/2022  What is the name of the medication or equipment?   zolpidem (AMBIEN) 10 MG tablet  **Alternative requested; prior auth for >90 per calendar year**  Have you contacted your pharmacy to request a refill? Yes   Which pharmacy would you like this sent to?  [id number] [place], [place] - [address] [facility] [address] [place] [address] Phone: [phone] Fax: [phone]    Patient notified that their request is being sent to the clinical staff for review and that they should receive a response within 2 business days.   Please advise pharmacist at [phone].

## 2023-04-04 NOTE — Telephone Encounter (Signed)
Requested medication (s) are due for refill today: routing for review  Requested medication (s) are on the active medication list: yes  Last refill:  01/23/23  Future visit scheduled: no  Notes to clinic:   Medication not assigned to a protocol, review manually.      Requested Prescriptions  Pending Prescriptions Disp Refills   ipratropium (ATROVENT) 0.06 % nasal spray [Pharmacy Med Name: IPRATROPIUM 0.06% SPRAY]  1    Sig: SPRAY 2 SPRAYS INTO EACH NOSTRIL 4 TIMES A DAY.     Off-Protocol Failed - 04/03/2023  6:10 PM      Failed - Medication not assigned to a protocol, review manually.      Failed - Valid encounter within last 12 months    Recent Outpatient Visits           1 year ago Benign essential HTN   [facility] Tanya Nones, Priscille Heidelberg, MD   2 years ago Benign essential HTN   [facility] Tanya Nones, Priscille Heidelberg, MD   2 years ago Benign essential HTN   [facility] Donita Brooks, MD   3 years ago Acute idiopathic gout of right wrist   [facility] Pickard, Priscille Heidelberg, MD   3 years ago General medical exam   [facility] Donita Brooks, MD             Off-Protocol Failed - 04/03/2023  6:10 PM      Failed - Medication not assigned to a protocol, review manually.      Failed - Valid encounter within last 12 months    Recent Outpatient Visits           1 year ago Benign essential HTN   [facility] Tanya Nones, Priscille Heidelberg, MD   2 years ago Benign essential HTN   [facility] Tanya Nones, Priscille Heidelberg, MD   2 years ago Benign essential HTN   [facility] Tanya Nones Priscille Heidelberg, MD   3 years ago Acute idiopathic gout of right wrist   [facility] Tanya Nones, Priscille Heidelberg, MD   3 years ago General medical exam   [facility] Pickard, Priscille Heidelberg, MD               mometasone (ELOCON) 0.1 % cream [Pharmacy Med Name: MOMETASONE FUROATE  0.1% CREAM] 45 g 2    Sig: APPLY TO AFFECTED AREA TOPICALLY EVERY DAY     Off-Protocol Failed - 04/03/2023  6:10 PM      Failed - Medication not assigned to a protocol, review manually.      Failed - Valid encounter within last 12 months    Recent Outpatient Visits           1 year ago Benign essential HTN   [facility] Tanya Nones Priscille Heidelberg, MD   2 years ago Benign essential HTN   [facility] Tanya Nones, Priscille Heidelberg, MD   2 years ago Benign essential HTN   [facility] Donita Brooks, MD   3 years ago Acute idiopathic gout of right wrist   [facility] Pickard, Priscille Heidelberg, MD   3 years ago General medical exam   [facility] Pickard, Priscille Heidelberg, MD

## 2023-04-07 NOTE — Pre-Procedure Instructions (Signed)
Surgical Instructions    Your procedure is scheduled on April 15, 2023.  Report to [facility] Main Entrance "A" at 10:10 A.M., then check in with the Admitting office.  Call this number if you have problems the morning of surgery:  [phone]   If you have any questions prior to your surgery date call [phone]: Open Monday-Friday 8am-4pm If you experience any cold or flu symptoms such as cough, fever, chills, shortness of breath, etc. between now and your scheduled surgery, please notify us at the above number     Remember:  Do not eat after midnight the night before your surgery  You may drink clear liquids until 09:10am the morning of your surgery.   Clear liquids allowed are: Water, Non-Citrus Juices (without pulp), Carbonated Beverages, Clear Tea, Black Coffee ONLY (NO MILK, CREAM OR POWDERED CREAMER of any kind), and Gatorade   Enhanced Recovery after Surgery for Orthopedics Enhanced Recovery after Surgery is a protocol used to improve the stress on your body and your recovery after surgery.  Patient Instructions  The day of surgery (if you do NOT have diabetes):  Drink ONE (1) Pre-Surgery Clear Ensure by 9:10 am the morning of surgery   This drink was given to you during your hospital  pre-op appointment visit. Nothing else to drink after completing the  Pre-Surgery Clear Ensure.          If you have questions, please contact your surgeon's office.     Take these medicines the morning of surgery with A SIP OF WATER:  Gabapentin (NEURONTIN) Ipratropium (ATROVENT) Pantoprazole (PROTONIX) Sertraline (ZOLOFT) Valacyclovir (VALTREX)  If needed: Albuterol (PROVENTIL HFA;VENTOLIN HFA) inhaler Alprazolam (XANAX) Methocarbamol (ROBAXIN)   As of today, STOP taking any Aspirin (unless otherwise instructed by your surgeon) Aleve, Naproxen, Ibuprofen, Motrin, Advil, Goody's, BC's, all herbal medications, fish oil, and all vitamins. This includes Celecoxib (CELEBREX).            Do not wear jewelry or makeup. Do not wear lotions, powders, perfumes or deodorant. Do not shave 48 hours prior to surgery.   Do not bring valuables to the hospital. Do not wear nail polish, gel polish, artificial nails, or any other type of covering on natural nails (fingers and toes) If you have artificial nails or gel coating that need to be removed by a nail salon, please have this removed prior to surgery. Artificial nails or gel coating may interfere with anesthesia's ability to adequately monitor your vital signs.  [facility] is not responsible for any belongings or valuables.    Do NOT Smoke (Tobacco/Vaping)  24 hours prior to your procedure  If you use a CPAP at night, you may bring your mask for your overnight stay.   Contacts, glasses, hearing aids, dentures or partials may not be worn into surgery, please bring cases for these belongings   For patients admitted to the hospital, discharge time will be determined by your treatment team.   Patients discharged the day of surgery will not be allowed to drive home, and someone needs to stay with them for 24 hours.   SURGICAL WAITING ROOM VISITATION Patients having surgery or a procedure may have no more than 2 support people in the waiting area - these visitors may rotate.   Children under the age of 38 must have an adult with them who is not the patient. If the patient needs to stay at the hospital during part of their recovery, the visitor guidelines for inpatient rooms apply.  Pre-op nurse will coordinate an appropriate time for 1 support person to accompany patient in pre-op.  This support person may not rotate.   Please refer to [email or web address] for the visitor guidelines for Inpatients (after your surgery is over and you are in a regular room).    Special instructions:    Oral Hygiene is also important to reduce your risk of infection.  Remember - BRUSH YOUR  TEETH THE MORNING OF SURGERY WITH YOUR REGULAR TOOTHPASTE     Pre-operative 5 CHG Bath Instructions   You can play a key role in reducing the risk of infection after surgery. Your skin needs to be as free of germs as possible. You can reduce the number of germs on your skin by washing with CHG (chlorhexidine gluconate) soap before surgery. CHG is an antiseptic soap that kills germs and continues to kill germs even after washing.   DO NOT use if you have an allergy to chlorhexidine/CHG or antibacterial soaps. If your skin becomes reddened or irritated, stop using the CHG and notify one of our RNs at [phone].   Please shower with the CHG soap starting 4 days before surgery using the following schedule:     Please keep in mind the following:  DO NOT shave, including legs and underarms, starting the day of your first shower.   You may shave your face at any point before/day of surgery.  Place clean sheets on your bed the day you start using CHG soap. Use a clean washcloth (not used since being washed) for each shower. DO NOT sleep with pets once you start using the CHG.   CHG Shower Instructions:  If you choose to wash your hair and private area, wash first with your normal shampoo/soap.  After you use shampoo/soap, rinse your hair and body thoroughly to remove shampoo/soap residue.  Turn the water OFF and apply about 3 tablespoons (45 ml) of CHG soap to a CLEAN washcloth.  Apply CHG soap ONLY FROM YOUR NECK DOWN TO YOUR TOES (washing for 3-5 minutes)  DO NOT use CHG soap on face, private areas, open wounds, or sores.  Pay special attention to the area where your surgery is being performed.  If you are having back surgery, having someone wash your back for you may be helpful. Wait 2 minutes after CHG soap is applied, then you may rinse off the CHG soap.  Pat dry with a clean towel  Put on clean clothes/pajamas   If you choose to wear lotion, please use ONLY the CHG-compatible  lotions on the back of this paper.     Additional instructions for the day of surgery: DO NOT APPLY any lotions, deodorants, cologne, or perfumes.   Put on clean/comfortable clothes.  Brush your teeth.  Ask your nurse before applying any prescription medications to the skin.      CHG Compatible Lotions   Aveeno Moisturizing lotion  Cetaphil Moisturizing Cream  Cetaphil Moisturizing Lotion  Clairol Herbal Essence Moisturizing Lotion, Dry Skin  Clairol Herbal Essence Moisturizing Lotion, Extra Dry Skin  Clairol Herbal Essence Moisturizing Lotion, Normal Skin  Curel Age Defying Therapeutic Moisturizing Lotion with Alpha Hydroxy  Curel Extreme Care Body Lotion  Curel Soothing Hands Moisturizing Hand Lotion  Curel Therapeutic Moisturizing Cream, Fragrance-Free  Curel Therapeutic Moisturizing Lotion, Fragrance-Free  Curel Therapeutic Moisturizing Lotion, Original Formula  Eucerin Daily Replenishing Lotion  Eucerin Dry Skin Therapy Plus Alpha Hydroxy Crme  Eucerin Dry Skin Therapy Plus Alpha Hydroxy Lotion  Eucerin Original Crme  Eucerin Original Lotion  Eucerin Plus Crme Eucerin Plus Lotion  Eucerin TriLipid Replenishing Lotion  Keri Anti-Bacterial Hand Lotion  Keri Deep Conditioning Original Lotion Dry Skin Formula Softly Scented  Keri Deep Conditioning Original Lotion, Fragrance Free Sensitive Skin Formula  Keri Lotion Fast Absorbing Fragrance Free Sensitive Skin Formula  Keri Lotion Fast Absorbing Softly Scented Dry Skin Formula  Keri Original Lotion  Keri Skin Renewal Lotion Keri Silky Smooth Lotion  Keri Silky Smooth Sensitive Skin Lotion  Nivea Body Creamy Conditioning Oil  Nivea Body Extra Enriched Lotion  Nivea Body Original Lotion  Nivea Body Sheer Moisturizing Lotion Nivea Crme  Nivea Skin Firming Lotion  NutraDerm 30 Skin Lotion  NutraDerm Skin Lotion  NutraDerm Therapeutic Skin Cream  NutraDerm Therapeutic Skin Lotion  ProShield Protective Hand Cream   Provon moisturizing lotion  [place]- Preparing for Total Shoulder Arthroplasty   Before surgery, you can play an important role. Because skin is not sterile, your skin needs to be as free of germs as possible. You can reduce the number of germs on your skin by using the following products. Benzoyl Peroxide Gel Reduces the number of germs present on the skin Applied twice a day to shoulder area starting two days before surgery   Chlorhexidine Gluconate (CHG) Soap An antiseptic cleaner that kills germs and bonds with the skin to continue killing germs even after washing Used for showering the night before surgery and morning of surgery     ==================================================================  Please follow these instructions carefully:  BENZOYL PEROXIDE 5% GEL  Please do not use if you have an allergy to benzoyl peroxide.   If your skin becomes reddened/irritated stop using the benzoyl peroxide.  Starting two days before surgery, apply as follows: Apply benzoyl peroxide in the morning and at night. Apply after taking a shower. If you are not taking a shower clean entire shoulder front, back, and side along with the armpit with a clean wet washcloth.  Place a quarter-sized dollop on your shoulder and rub in thoroughly, making sure to cover the front, back, and side of your shoulder, along with the armpit.   2 days before ____ AM   ____ PM              1 day before ____ AM   ____ PM                           Do this twice a day for two days.  (Last application is the night before surgery, AFTER using the CHG soap as described below).  Do NOT apply benzoyl peroxide gel on the day of surgery.  Day of Surgery: Shower as above Do not apply any deodorants/lotions.  Please wear clean clothes to the hospital/surgery center.   Remember to brush your teeth WITH YOUR REGULAR TOOTHPASTE.     If you received a COVID test during your pre-op visit, it is requested that you  wear a mask when out in public, stay away from anyone that may not be feeling well, and notify your surgeon if you develop symptoms. If you have been in contact with anyone that has tested positive in the last 10 days, please notify your surgeon.    Please read over the following fact sheets that you were given.

## 2023-04-08 ENCOUNTER — Encounter (HOSPITAL_COMMUNITY): Payer: Self-pay

## 2023-04-08 ENCOUNTER — Other Ambulatory Visit: Payer: Self-pay

## 2023-04-08 ENCOUNTER — Encounter (HOSPITAL_COMMUNITY)
Admission: RE | Admit: 2023-04-08 | Discharge: 2023-04-08 | Disposition: A | Discharge status: Home / Self Care | Payer: Medicare Other | Source: Ambulatory Visit | Attending: Orthopedic Surgery | Admitting: Orthopedic Surgery

## 2023-04-08 VITALS — BP 145/79 | HR 66 | Temp 98.3°F | Resp 18 | Ht 59.0 in | Wt 235.4 lb

## 2023-04-08 DIAGNOSIS — R9431 Abnormal electrocardiogram [ECG] [EKG]: Secondary | ICD-10-CM | POA: Diagnosis not present

## 2023-04-08 DIAGNOSIS — Z01818 Encounter for other preprocedural examination: Secondary | ICD-10-CM | POA: Diagnosis not present

## 2023-04-08 HISTORY — DX: Unspecified osteoarthritis, unspecified site: M19.90

## 2023-04-08 LAB — SURGICAL PCR SCREEN
MRSA, PCR: NEGATIVE
Staphylococcus aureus: NEGATIVE

## 2023-04-08 LAB — BASIC METABOLIC PANEL
Anion gap: 8 (ref 5–15)
BUN: 18 mg/dL (ref 8–23)
CO2: 29 mmol/L (ref 22–32)
Calcium: 9.1 mg/dL (ref 8.9–10.3)
Chloride: 100 mmol/L (ref 98–111)
Creatinine, Ser: 1.14 mg/dL — ABNORMAL HIGH (ref 0.44–1.00)
GFR, Estimated: 51 mL/min — ABNORMAL LOW (ref >60)
Glucose, Bld: 90 mg/dL (ref 70–99)
Potassium: 3.5 mmol/L (ref 3.5–5.1)
Sodium: 137 mmol/L (ref 135–145)

## 2023-04-08 LAB — CBC
HCT: 45.5 % (ref 36.0–46.0)
Hemoglobin: 14.5 g/dL (ref 12.0–15.0)
MCH: 28.9 pg (ref 26.0–34.0)
MCHC: 31.9 g/dL (ref 30.0–36.0)
MCV: 90.6 fL (ref 80.0–100.0)
Platelets: 227 10*3/uL (ref 150–400)
RBC: 5.02 MIL/uL (ref 3.87–5.11)
RDW: 13.5 % (ref 11.5–15.5)
WBC: 7 10*3/uL (ref 4.0–10.5)
nRBC: 0 % (ref 0.0–0.2)

## 2023-04-08 LAB — URINALYSIS, ROUTINE W REFLEX MICROSCOPIC
Bilirubin Urine: NEGATIVE
Glucose, UA: NEGATIVE mg/dL
Hgb urine dipstick: NEGATIVE
Ketones, ur: NEGATIVE mg/dL
Nitrite: NEGATIVE
Protein, ur: NEGATIVE mg/dL
Specific Gravity, Urine: 1.016 (ref 1.005–1.030)
pH: 6 (ref 5.0–8.0)

## 2023-04-08 NOTE — Progress Notes (Signed)
PCP - Broadus John T. Tanya Nones, MD Cardiologist - Denies  PPM/ICD - Denies  Chest x-ray - Denies EKG - 04/08/2023 Stress Test - Patient states she had stress test perfromed in [place], [place] 10+ years ago. ECHO - Denies Cardiac Cath - Denies  Sleep Study - Denies   DM: Denies  Blood Thinner Instructions: N/A Aspirin Instructions: N/A  ERAS Protcol - Yes PRE-SURGERY Ensure or G2- Ensure  COVID TEST- N/A   Anesthesia review: No  Patient denies shortness of breath, fever, cough and chest pain at PAT appointment   All instructions explained to the patient, with a verbal understanding of the material. Patient agrees to go over the instructions while at home for a better understanding.The opportunity to ask questions was provided.

## 2023-04-09 ENCOUNTER — Encounter (INDEPENDENT_AMBULATORY_CARE_PROVIDER_SITE_OTHER): Payer: Self-pay | Admitting: Ophthalmology

## 2023-04-09 ENCOUNTER — Ambulatory Visit (INDEPENDENT_AMBULATORY_CARE_PROVIDER_SITE_OTHER): Payer: Medicare Other | Admitting: Ophthalmology

## 2023-04-09 DIAGNOSIS — H353122 Nonexudative age-related macular degeneration, left eye, intermediate dry stage: Secondary | ICD-10-CM | POA: Diagnosis not present

## 2023-04-09 DIAGNOSIS — H25812 Combined forms of age-related cataract, left eye: Secondary | ICD-10-CM | POA: Diagnosis not present

## 2023-04-09 DIAGNOSIS — H40003 Preglaucoma, unspecified, bilateral: Secondary | ICD-10-CM | POA: Diagnosis not present

## 2023-04-09 DIAGNOSIS — H353211 Exudative age-related macular degeneration, right eye, with active choroidal neovascularization: Secondary | ICD-10-CM | POA: Diagnosis not present

## 2023-04-09 DIAGNOSIS — H35033 Hypertensive retinopathy, bilateral: Secondary | ICD-10-CM

## 2023-04-09 DIAGNOSIS — H04123 Dry eye syndrome of bilateral lacrimal glands: Secondary | ICD-10-CM

## 2023-04-09 DIAGNOSIS — I10 Essential (primary) hypertension: Secondary | ICD-10-CM | POA: Diagnosis not present

## 2023-04-09 DIAGNOSIS — Z961 Presence of intraocular lens: Secondary | ICD-10-CM | POA: Diagnosis not present

## 2023-04-09 LAB — URINE CULTURE: Culture: <10000 — AB

## 2023-04-09 MED ORDER — FARICIMAB-SVOA 6 MG/0.05ML IZ SOLN
6.0000 mg | INTRAVITREAL | Status: AC | PRN
Start: 2023-04-09 — End: 2023-04-09
  Administered 2023-04-09: 6 mg via INTRAVITREAL

## 2023-04-13 ENCOUNTER — Telehealth: Payer: Self-pay | Admitting: *Deleted

## 2023-04-13 NOTE — Telephone Encounter (Signed)
Attempted pre-op call to patient; no answer and requested call back. Will reach out tomorrow if don't hear back today.

## 2023-04-13 NOTE — Care Plan (Addendum)
Call back from patient to discuss her upcoming Right Reverse shoulder arthroplasty with Dr. August Saucer on 04/15/23. She is an Ortho bundle patient through [facility] and is agreeable to case management. She lives with her spouse, who will be assisting after surgery. She will have her home brace delivered by [employer] on Monday, 04/14/23 prior to surgery. Reviewed all post op care instructions. Will continue to follow for needs.Asked patient SANE (single assessment numerical evaluation). She states her shoulder is currently at 3% normal out of 100%. Will re-evaluate at 3 months post op.

## 2023-04-13 NOTE — Telephone Encounter (Signed)
Ortho bundle pre-op call completed. 

## 2023-04-15 ENCOUNTER — Observation Stay (HOSPITAL_COMMUNITY): Payer: Medicare Other

## 2023-04-15 ENCOUNTER — Ambulatory Visit (HOSPITAL_COMMUNITY): Payer: Medicare Other | Admitting: Certified Registered Nurse Anesthetist

## 2023-04-15 ENCOUNTER — Encounter (HOSPITAL_COMMUNITY): Payer: Self-pay | Admitting: Orthopedic Surgery

## 2023-04-15 ENCOUNTER — Observation Stay (HOSPITAL_COMMUNITY)
Admission: RE | Admit: 2023-04-15 | Discharge: 2023-04-16 | Disposition: A | Discharge status: Home / Self Care | Payer: Medicare Other | Attending: Orthopedic Surgery | Admitting: Orthopedic Surgery

## 2023-04-15 ENCOUNTER — Encounter (HOSPITAL_COMMUNITY)
Admission: RE | Disposition: A | Discharge status: Home / Self Care | Payer: Self-pay | Source: Home / Self Care | Attending: Orthopedic Surgery

## 2023-04-15 ENCOUNTER — Other Ambulatory Visit: Payer: Self-pay

## 2023-04-15 ENCOUNTER — Ambulatory Visit (HOSPITAL_BASED_OUTPATIENT_CLINIC_OR_DEPARTMENT_OTHER): Payer: Medicare Other | Admitting: Certified Registered Nurse Anesthetist

## 2023-04-15 DIAGNOSIS — Z96698 Presence of other orthopedic joint implants: Secondary | ICD-10-CM | POA: Diagnosis not present

## 2023-04-15 DIAGNOSIS — M19011 Primary osteoarthritis, right shoulder: Principal | ICD-10-CM

## 2023-04-15 DIAGNOSIS — I10 Essential (primary) hypertension: Secondary | ICD-10-CM | POA: Insufficient documentation

## 2023-04-15 DIAGNOSIS — Z471 Aftercare following joint replacement surgery: Secondary | ICD-10-CM | POA: Diagnosis not present

## 2023-04-15 DIAGNOSIS — M12811 Other specific arthropathies, not elsewhere classified, right shoulder: Secondary | ICD-10-CM

## 2023-04-15 DIAGNOSIS — Z96611 Presence of right artificial shoulder joint: Secondary | ICD-10-CM | POA: Diagnosis not present

## 2023-04-15 DIAGNOSIS — M7521 Bicipital tendinitis, right shoulder: Secondary | ICD-10-CM | POA: Diagnosis not present

## 2023-04-15 DIAGNOSIS — Z79899 Other long term (current) drug therapy: Secondary | ICD-10-CM | POA: Diagnosis not present

## 2023-04-15 DIAGNOSIS — Z6841 Body Mass Index (BMI) 40.0 and over, adult: Secondary | ICD-10-CM

## 2023-04-15 DIAGNOSIS — Z01818 Encounter for other preprocedural examination: Secondary | ICD-10-CM

## 2023-04-15 DIAGNOSIS — G8918 Other acute postprocedural pain: Secondary | ICD-10-CM | POA: Diagnosis not present

## 2023-04-15 HISTORY — PX: BICEPT TENODESIS: SHX5116

## 2023-04-15 HISTORY — PX: REVERSE SHOULDER ARTHROPLASTY: SHX5054

## 2023-04-15 SURGERY — ARTHROPLASTY, SHOULDER, TOTAL, REVERSE
Anesthesia: General | Site: Shoulder | Laterality: Right

## 2023-04-15 MED ORDER — AMISULPRIDE (ANTIEMETIC) 5 MG/2ML IV SOLN: 10.0000 mg | Freq: Once | INTRAVENOUS | Status: DC | PRN

## 2023-04-15 MED ORDER — CEFAZOLIN SODIUM-DEXTROSE 2-4 GM/100ML-% IV SOLN
2.0000 g | INTRAVENOUS | Status: AC
  Administered 2023-04-15: 2 g via INTRAVENOUS
  Filled 2023-04-15: qty 100

## 2023-04-15 MED ORDER — PHENYLEPHRINE 80 MCG/ML (10ML) SYRINGE FOR IV PUSH (FOR BLOOD PRESSURE SUPPORT)
PREFILLED_SYRINGE | INTRAVENOUS | Status: AC
  Filled 2023-04-15: qty 10

## 2023-04-15 MED ORDER — PANTOPRAZOLE SODIUM 40 MG PO TBEC
40.0000 mg | DELAYED_RELEASE_TABLET | Freq: Every day | ORAL | Status: DC
  Administered 2023-04-16: 40 mg via ORAL
  Filled 2023-04-15: qty 1

## 2023-04-15 MED ORDER — DEXAMETHASONE SODIUM PHOSPHATE 10 MG/ML IJ SOLN
INTRAMUSCULAR | Status: DC | PRN
  Administered 2023-04-15: 10 mg via INTRAVENOUS

## 2023-04-15 MED ORDER — IPRATROPIUM BROMIDE 0.06 % NA SOLN
2.0000 | Freq: Four times a day (QID) | NASAL | Status: DC
  Filled 2023-04-15: qty 15

## 2023-04-15 MED ORDER — HYDROMORPHONE HCL 1 MG/ML IJ SOLN: 0.5000 mg | INTRAMUSCULAR | Status: DC | PRN

## 2023-04-15 MED ORDER — CELECOXIB 100 MG PO CAPS
100.0000 mg | ORAL_CAPSULE | Freq: Two times a day (BID) | ORAL | Status: DC
  Administered 2023-04-15 – 2023-04-16 (×2): 100 mg via ORAL
  Filled 2023-04-15 (×3): qty 1

## 2023-04-15 MED ORDER — CEFAZOLIN SODIUM-DEXTROSE 2-4 GM/100ML-% IV SOLN
2.0000 g | Freq: Three times a day (TID) | INTRAVENOUS | Status: AC
  Administered 2023-04-15 – 2023-04-16 (×3): 2 g via INTRAVENOUS
  Filled 2023-04-15 (×3): qty 100

## 2023-04-15 MED ORDER — SUGAMMADEX SODIUM 200 MG/2ML IV SOLN
INTRAVENOUS | Status: DC | PRN
  Administered 2023-04-15: 200 mg via INTRAVENOUS

## 2023-04-15 MED ORDER — PROPOFOL 10 MG/ML IV BOLUS
INTRAVENOUS | Status: AC
  Filled 2023-04-15: qty 20

## 2023-04-15 MED ORDER — OXYCODONE HCL 5 MG PO TABS
5.0000 mg | ORAL_TABLET | ORAL | Status: DC | PRN
  Administered 2023-04-15 (×2): 5 mg via ORAL
  Filled 2023-04-15 (×4): qty 1

## 2023-04-15 MED ORDER — GABAPENTIN 300 MG PO CAPS
300.0000 mg | ORAL_CAPSULE | Freq: Three times a day (TID) | ORAL | Status: DC
  Administered 2023-04-15 – 2023-04-16 (×3): 300 mg via ORAL
  Filled 2023-04-15 (×3): qty 1

## 2023-04-15 MED ORDER — POVIDONE-IODINE 10 % EX SWAB
2.0000 | Freq: Once | CUTANEOUS | Status: AC
  Administered 2023-04-15: 2 via TOPICAL

## 2023-04-15 MED ORDER — ALBUTEROL SULFATE (2.5 MG/3ML) 0.083% IN NEBU
3.0000 mL | INHALATION_SOLUTION | RESPIRATORY_TRACT | Status: DC | PRN
  Administered 2023-04-16: 3 mL via RESPIRATORY_TRACT
  Filled 2023-04-15: qty 3

## 2023-04-15 MED ORDER — ONDANSETRON HCL 4 MG/2ML IJ SOLN
INTRAMUSCULAR | Status: AC
  Filled 2023-04-15: qty 2

## 2023-04-15 MED ORDER — FENTANYL CITRATE (PF) 100 MCG/2ML IJ SOLN: 50.0000 ug | Freq: Once | INTRAMUSCULAR | Status: AC

## 2023-04-15 MED ORDER — FENTANYL CITRATE (PF) 100 MCG/2ML IJ SOLN: 25.0000 ug | INTRAMUSCULAR | Status: DC | PRN

## 2023-04-15 MED ORDER — VASOPRESSIN 20 UNIT/ML IV SOLN
INTRAVENOUS | Status: AC
  Filled 2023-04-15: qty 1

## 2023-04-15 MED ORDER — HYDROCHLOROTHIAZIDE 12.5 MG PO TABS
12.5000 mg | ORAL_TABLET | Freq: Every day | ORAL | Status: DC
  Administered 2023-04-16: 12.5 mg via ORAL
  Filled 2023-04-15: qty 1

## 2023-04-15 MED ORDER — POVIDONE-IODINE 7.5 % EX SOLN
Freq: Once | CUTANEOUS | Status: DC
  Filled 2023-04-15: qty 118

## 2023-04-15 MED ORDER — SODIUM CHLORIDE 0.9 % IV SOLN: INTRAVENOUS | Status: AC

## 2023-04-15 MED ORDER — 0.9 % SODIUM CHLORIDE (POUR BTL) OPTIME
TOPICAL | Status: DC | PRN
  Administered 2023-04-15 (×5): 1000 mL

## 2023-04-15 MED ORDER — ONDANSETRON HCL 4 MG/2ML IJ SOLN
INTRAMUSCULAR | Status: DC | PRN
  Administered 2023-04-15: 4 mg via INTRAVENOUS

## 2023-04-15 MED ORDER — ZOLPIDEM TARTRATE 5 MG PO TABS
5.0000 mg | ORAL_TABLET | Freq: Every day | ORAL | Status: DC
  Administered 2023-04-15: 5 mg via ORAL
  Filled 2023-04-15: qty 1

## 2023-04-15 MED ORDER — TRANEXAMIC ACID-NACL 1000-0.7 MG/100ML-% IV SOLN
1000.0000 mg | INTRAVENOUS | Status: AC
  Administered 2023-04-15: 1000 mg via INTRAVENOUS
  Filled 2023-04-15: qty 100

## 2023-04-15 MED ORDER — ASPIRIN 81 MG PO TBEC
81.0000 mg | DELAYED_RELEASE_TABLET | Freq: Every day | ORAL | Status: DC
  Administered 2023-04-16: 81 mg via ORAL
  Filled 2023-04-15: qty 1

## 2023-04-15 MED ORDER — ACETAMINOPHEN 325 MG PO TABS: 325.0000 mg | ORAL_TABLET | Freq: Four times a day (QID) | ORAL | Status: DC | PRN

## 2023-04-15 MED ORDER — VANCOMYCIN HCL 1000 MG IV SOLR
INTRAVENOUS | Status: AC
  Filled 2023-04-15: qty 20

## 2023-04-15 MED ORDER — DOCUSATE SODIUM 100 MG PO CAPS
100.0000 mg | ORAL_CAPSULE | Freq: Two times a day (BID) | ORAL | Status: DC
  Administered 2023-04-15 – 2023-04-16 (×2): 100 mg via ORAL
  Filled 2023-04-15 (×2): qty 1

## 2023-04-15 MED ORDER — CHLORHEXIDINE GLUCONATE 0.12 % MT SOLN
OROMUCOSAL | Status: AC
  Filled 2023-04-15: qty 15

## 2023-04-15 MED ORDER — TELMISARTAN-HCTZ 80-12.5 MG PO TABS: 1.0000 | ORAL_TABLET | Freq: Every day | ORAL | Status: DC

## 2023-04-15 MED ORDER — LACTATED RINGERS IV SOLN: INTRAVENOUS | Status: DC

## 2023-04-15 MED ORDER — IRRISEPT - 450ML BOTTLE WITH 0.05% CHG IN STERILE WATER, USP 99.95% OPTIME
TOPICAL | Status: DC | PRN
  Administered 2023-04-15: 450 mL

## 2023-04-15 MED ORDER — ONDANSETRON HCL 4 MG PO TABS: 4.0000 mg | ORAL_TABLET | Freq: Four times a day (QID) | ORAL | Status: DC | PRN

## 2023-04-15 MED ORDER — METHOCARBAMOL 500 MG PO TABS
500.0000 mg | ORAL_TABLET | Freq: Four times a day (QID) | ORAL | Status: DC | PRN
  Administered 2023-04-15: 500 mg via ORAL
  Filled 2023-04-15: qty 1

## 2023-04-15 MED ORDER — ACETAMINOPHEN 500 MG PO TABS
1000.0000 mg | ORAL_TABLET | Freq: Four times a day (QID) | ORAL | Status: DC
  Administered 2023-04-15 – 2023-04-16 (×3): 1000 mg via ORAL
  Filled 2023-04-15 (×3): qty 2

## 2023-04-15 MED ORDER — METOCLOPRAMIDE HCL 5 MG/ML IJ SOLN: 5.0000 mg | Freq: Three times a day (TID) | INTRAMUSCULAR | Status: DC | PRN

## 2023-04-15 MED ORDER — ROCURONIUM BROMIDE 10 MG/ML (PF) SYRINGE
PREFILLED_SYRINGE | INTRAVENOUS | Status: DC | PRN
  Administered 2023-04-15: 50 mg via INTRAVENOUS

## 2023-04-15 MED ORDER — BUPIVACAINE LIPOSOME 1.3 % IJ SUSP
INTRAMUSCULAR | Status: DC | PRN
  Administered 2023-04-15: 10 mL via PERINEURAL

## 2023-04-15 MED ORDER — PHENOL 1.4 % MT LIQD: 1.0000 | OROMUCOSAL | Status: DC | PRN

## 2023-04-15 MED ORDER — BUPIVACAINE HCL (PF) 0.5 % IJ SOLN
INTRAMUSCULAR | Status: DC | PRN
  Administered 2023-04-15: 15 mL via PERINEURAL

## 2023-04-15 MED ORDER — IRBESARTAN 150 MG PO TABS
300.0000 mg | ORAL_TABLET | Freq: Every day | ORAL | Status: DC
  Administered 2023-04-16: 300 mg via ORAL
  Filled 2023-04-15: qty 2

## 2023-04-15 MED ORDER — FENTANYL CITRATE (PF) 250 MCG/5ML IJ SOLN
INTRAMUSCULAR | Status: DC | PRN
  Administered 2023-04-15: 50 ug via INTRAVENOUS

## 2023-04-15 MED ORDER — ONDANSETRON HCL 4 MG/2ML IJ SOLN: 4.0000 mg | Freq: Four times a day (QID) | INTRAMUSCULAR | Status: DC | PRN

## 2023-04-15 MED ORDER — METOCLOPRAMIDE HCL 5 MG PO TABS: 5.0000 mg | ORAL_TABLET | Freq: Three times a day (TID) | ORAL | Status: DC | PRN

## 2023-04-15 MED ORDER — MIDAZOLAM HCL 2 MG/2ML IJ SOLN: 1.0000 mg | Freq: Once | INTRAMUSCULAR | Status: AC

## 2023-04-15 MED ORDER — MIDAZOLAM HCL 2 MG/2ML IJ SOLN
INTRAMUSCULAR | Status: AC
  Administered 2023-04-15: 1 mg via INTRAVENOUS
  Filled 2023-04-15: qty 2

## 2023-04-15 MED ORDER — SERTRALINE HCL 50 MG PO TABS
75.0000 mg | ORAL_TABLET | Freq: Every day | ORAL | Status: DC
  Administered 2023-04-16: 75 mg via ORAL
  Filled 2023-04-15: qty 1

## 2023-04-15 MED ORDER — ORAL CARE MOUTH RINSE: 15.0000 mL | Freq: Once | OROMUCOSAL | Status: AC

## 2023-04-15 MED ORDER — METHOCARBAMOL 1000 MG/10ML IJ SOLN: 500.0000 mg | Freq: Four times a day (QID) | INTRAVENOUS | Status: DC | PRN

## 2023-04-15 MED ORDER — ROCURONIUM BROMIDE 10 MG/ML (PF) SYRINGE
PREFILLED_SYRINGE | INTRAVENOUS | Status: AC
  Filled 2023-04-15: qty 10

## 2023-04-15 MED ORDER — DEXAMETHASONE SODIUM PHOSPHATE 10 MG/ML IJ SOLN
INTRAMUSCULAR | Status: AC
  Filled 2023-04-15: qty 1

## 2023-04-15 MED ORDER — LIDOCAINE 2% (20 MG/ML) 5 ML SYRINGE
INTRAMUSCULAR | Status: AC
  Filled 2023-04-15: qty 5

## 2023-04-15 MED ORDER — VANCOMYCIN HCL 1000 MG IV SOLR
INTRAVENOUS | Status: DC | PRN
  Administered 2023-04-15: 1000 mg via TOPICAL

## 2023-04-15 MED ORDER — LIDOCAINE 2% (20 MG/ML) 5 ML SYRINGE
INTRAMUSCULAR | Status: DC | PRN
  Administered 2023-04-15: 40 mg via INTRAVENOUS

## 2023-04-15 MED ORDER — PHENYLEPHRINE 80 MCG/ML (10ML) SYRINGE FOR IV PUSH (FOR BLOOD PRESSURE SUPPORT)
PREFILLED_SYRINGE | INTRAVENOUS | Status: DC | PRN
  Administered 2023-04-15: 160 ug via INTRAVENOUS
  Administered 2023-04-15: 320 ug via INTRAVENOUS

## 2023-04-15 MED ORDER — ACETAMINOPHEN 500 MG PO TABS
1000.0000 mg | ORAL_TABLET | Freq: Once | ORAL | Status: AC
  Administered 2023-04-15: 1000 mg via ORAL
  Filled 2023-04-15: qty 2

## 2023-04-15 MED ORDER — MENTHOL 3 MG MT LOZG: 1.0000 | LOZENGE | OROMUCOSAL | Status: DC | PRN

## 2023-04-15 MED ORDER — FENTANYL CITRATE (PF) 100 MCG/2ML IJ SOLN
INTRAMUSCULAR | Status: AC
  Administered 2023-04-15: 50 ug via INTRAVENOUS
  Filled 2023-04-15: qty 2

## 2023-04-15 MED ORDER — PHENYLEPHRINE HCL-NACL 20-0.9 MG/250ML-% IV SOLN
INTRAVENOUS | Status: DC | PRN
  Administered 2023-04-15: 100 ug/min via INTRAVENOUS

## 2023-04-15 MED ORDER — FENTANYL CITRATE (PF) 250 MCG/5ML IJ SOLN
INTRAMUSCULAR | Status: AC
  Filled 2023-04-15: qty 5

## 2023-04-15 MED ORDER — CHLORHEXIDINE GLUCONATE 0.12 % MT SOLN
15.0000 mL | Freq: Once | OROMUCOSAL | Status: AC
  Administered 2023-04-15: 15 mL via OROMUCOSAL

## 2023-04-15 MED ORDER — PROPOFOL 10 MG/ML IV BOLUS
INTRAVENOUS | Status: DC | PRN
  Administered 2023-04-15: 50 mg via INTRAVENOUS
  Administered 2023-04-15: 150 mg via INTRAVENOUS

## 2023-04-15 SURGICAL SUPPLY — 76 items
AID PSTN UNV HD RSTRNT DISP (MISCELLANEOUS) ×1
ALCOHOL 70% 16 OZ (MISCELLANEOUS) ×1 IMPLANT
APL PRP STRL LF DISP 70% ISPRP (MISCELLANEOUS) ×2
AUG COMP REV MI TAPER ADAPTER (Joint) ×1 IMPLANT
AUGMENT COMP REV MI TAPR ADPTR (Joint) IMPLANT
BAG COUNTER SPONGE SURGICOUNT (BAG) ×1 IMPLANT
BAG SPNG CNTER NS LX DISP (BAG) ×2
BIT DRILL 2.7 W/STOP DISP (BIT) IMPLANT
BIT DRILL QUICK REL 1/8 2PK SL (BIT) IMPLANT
BIT DRILL TWIST 2.7 (BIT) IMPLANT
BLADE SAW SGTL 13X75X1.27 (BLADE) ×1 IMPLANT
BRNG HUM +3 36 RVRS SHLDR (Shoulder) ×1 IMPLANT
BSPLAT GLND SM AUG TPR ADPR (Joint) ×1 IMPLANT
CHLORAPREP W/TINT 26 (MISCELLANEOUS) ×1 IMPLANT
COOLER ICEMAN CLASSIC (MISCELLANEOUS) ×1 IMPLANT
COVER SURGICAL LIGHT HANDLE (MISCELLANEOUS) ×1 IMPLANT
DRAPE INCISE IOBAN 66X45 STRL (DRAPES) ×1 IMPLANT
DRAPE U-SHAPE 47X51 STRL (DRAPES) ×2 IMPLANT
DRSG AQUACEL AG ADV 3.5X10 (GAUZE/BANDAGES/DRESSINGS) ×1 IMPLANT
ELECT BLADE 4.0 EZ CLEAN MEGAD (MISCELLANEOUS) ×1
ELECT REM PT RETURN 9FT ADLT (ELECTROSURGICAL) ×1
ELECTRODE BLDE 4.0 EZ CLN MEGD (MISCELLANEOUS) ×1 IMPLANT
ELECTRODE REM PT RTRN 9FT ADLT (ELECTROSURGICAL) ×1 IMPLANT
GAUZE SPONGE 4X4 12PLY STRL LF (GAUZE/BANDAGES/DRESSINGS) ×1 IMPLANT
GLENOID SPHERE STD STRL 36MM (Orthopedic Implant) IMPLANT
GLOVE BIOGEL PI IND STRL 6.5 (GLOVE) ×1 IMPLANT
GLOVE BIOGEL PI IND STRL 8 (GLOVE) ×1 IMPLANT
GLOVE ECLIPSE 6.5 STRL STRAW (GLOVE) ×1 IMPLANT
GLOVE ECLIPSE 8.0 STRL XLNG CF (GLOVE) ×1 IMPLANT
GOWN STRL REUS W/ TWL LRG LVL3 (GOWN DISPOSABLE) ×1 IMPLANT
GOWN STRL REUS W/ TWL XL LVL3 (GOWN DISPOSABLE) ×1 IMPLANT
GOWN STRL REUS W/TWL LRG LVL3 (GOWN DISPOSABLE) ×1
GOWN STRL REUS W/TWL XL LVL3 (GOWN DISPOSABLE) ×1
GUIDE MODEL REV SHLD RT (ORTHOPEDIC DISPOSABLE SUPPLIES) IMPLANT
HYDROGEN PEROXIDE 16OZ (MISCELLANEOUS) ×1 IMPLANT
JET LAVAGE IRRISEPT WOUND (IRRIGATION / IRRIGATOR) ×1
KIT BASIN OR (CUSTOM PROCEDURE TRAY) ×1 IMPLANT
KIT TURNOVER KIT B (KITS) ×1 IMPLANT
LAVAGE JET IRRISEPT WOUND (IRRIGATION / IRRIGATOR) ×1 IMPLANT
MANIFOLD NEPTUNE II (INSTRUMENTS) ×1 IMPLANT
NDL SUT 6 .5 CRC .975X.05 MAYO (NEEDLE) IMPLANT
NEEDLE MAYO TAPER (NEEDLE) ×1
NS IRRIG 1000ML POUR BTL (IV SOLUTION) ×1 IMPLANT
PACK SHOULDER (CUSTOM PROCEDURE TRAY) ×1 IMPLANT
PAD COLD SHLDR WRAP-ON (PAD) ×1 IMPLANT
PIN HUMERAL STMN 3.2MMX9IN (INSTRUMENTS) IMPLANT
REAMER GUIDE BUSHING SURG DISP (MISCELLANEOUS) IMPLANT
REAMER GUIDE W/SCREW AUG (MISCELLANEOUS) IMPLANT
RESTRAINT HEAD UNIVERSAL NS (MISCELLANEOUS) ×1 IMPLANT
RETRIEVER SUT HEWSON (MISCELLANEOUS) ×1 IMPLANT
SCREW BONE LOCKING 4.75X30X3.5 (Screw) IMPLANT
SCREW BONE STRL 6.5MMX30MM (Screw) IMPLANT
SCREW LOCKING 4.75MMX15MM (Screw) IMPLANT
SCREW LOCKING NS 4.75MMX20MM (Screw) IMPLANT
SLING ARM IMMOBILIZER LRG (SOFTGOODS) ×1 IMPLANT
SLING ARM IMMOBILIZER XL (CAST SUPPLIES) IMPLANT
SOL PREP POV-IOD 4OZ 10% (MISCELLANEOUS) ×1 IMPLANT
SPONGE T-LAP 18X18 ~~LOC~~+RFID (SPONGE) ×1 IMPLANT
STEM SHOULDER (Stem) IMPLANT
STRIP CLOSURE SKIN 1/2X4 (GAUZE/BANDAGES/DRESSINGS) ×1 IMPLANT
SUCTION TUBE FRAZIER 10FR DISP (SUCTIONS) ×1 IMPLANT
SUT BROADBAND TAPE 2PK 1.5 (SUTURE) IMPLANT
SUT MNCRL AB 3-0 PS2 18 (SUTURE) ×1 IMPLANT
SUT SILK 2 0 TIES 10X30 (SUTURE) ×1 IMPLANT
SUT VIC AB 0 CT1 27 (SUTURE) ×4
SUT VIC AB 0 CT1 27XBRD ANBCTR (SUTURE) ×4 IMPLANT
SUT VIC AB 1 CT1 27 (SUTURE) ×3
SUT VIC AB 1 CT1 27XBRD ANBCTR (SUTURE) ×2 IMPLANT
SUT VIC AB 2-0 CT1 27 (SUTURE)
SUT VIC AB 2-0 CT1 TAPERPNT 27 (SUTURE) ×3 IMPLANT
SUT VIC AB 2-0 CT2 27 (SUTURE) IMPLANT
SUT VICRYL 0 UR6 27IN ABS (SUTURE) ×2 IMPLANT
TOWEL GREEN STERILE (TOWEL DISPOSABLE) ×1 IMPLANT
TRAY HUM REV SHOULDER 36 +3 (Shoulder) IMPLANT
TRAY HUM REV SHOULDER STD +6 (Shoulder) IMPLANT
WATER STERILE IRR 1000ML POUR (IV SOLUTION) ×1 IMPLANT

## 2023-04-15 NOTE — Brief Op Note (Signed)
   04/15/2023  2:52 PM  PATIENT:  Faith Reeves  72 y.o. female  PRE-OPERATIVE DIAGNOSIS:  RIGHT SHOULDER ROTATOR CUFF ARTHROPATHY, BICEPS TENDINITIS  POST-OPERATIVE DIAGNOSIS:  RIGHT SHOULDER ROTATOR CUFF ARTHROPATHY, BICEPS TENDINITIS  PROCEDURE:  Procedure(s): RIGHT REVERSE SHOULDER ARTHROPLASTY RIGHT BICEPS TENODESIS  SURGEON:  Surgeon(s): Cammy Copa, MD  ASSISTANT: magnant pa  ANESTHESIA:   general  EBL: 150 ml    Total I/O In: 1200 [I.V.:1000; IV Piggyback:200] Out: 250 [Blood:250]  BLOOD ADMINISTERED: none  DRAINS: none   LOCAL MEDICATIONS USED:  vanco  SPECIMEN:  No Specimen  COUNTS:  YES  TOURNIQUET:  * No tourniquets in log *  DICTATION: .Other Dictation: Dictation Number [id number]  PLAN OF CARE: Admit for overnight observation  PATIENT DISPOSITION:  PACU - hemodynamically stable

## 2023-04-15 NOTE — Anesthesia Procedure Notes (Signed)
Anesthesia Regional Block: Interscalene brachial plexus block   Pre-Anesthetic Checklist: , timeout performed,  Correct Patient, Correct Site, Correct Laterality,  Correct Procedure, Correct Position, site marked,  Risks and benefits discussed,  Surgical consent,  Pre-op evaluation,  At surgeon's request and post-op pain management  Laterality: Right  Prep: chloraprep       Needles:  Injection technique: Single-shot  Needle Type: Echogenic Stimulator Needle     Needle Length: 9cm  Needle Gauge: 21     Additional Needles:   Procedures:, nerve stimulator,,, ultrasound used (permanent image in chart),,     Nerve Stimulator or Paresthesia:  Response: deltoid and bicep, 0.5 mA  Additional Responses:   Narrative:  Start time: 04/15/2023 10:47 AM End time: 04/15/2023 10:53 AM Injection made incrementally with aspirations every 5 mL.  Performed by: Personally  Anesthesiologist: Marcene Duos, MD

## 2023-04-15 NOTE — Transfer of Care (Signed)
Immediate Anesthesia Transfer of Care Note  Patient: Faith Reeves  Procedure(s) Performed: RIGHT REVERSE SHOULDER ARTHROPLASTY (Right: Shoulder) RIGHT BICEPS TENODESIS (Right: Shoulder)  Patient Location: PACU  Anesthesia Type:General and Regional  Level of Consciousness: awake, drowsy, and patient cooperative  Airway & Oxygen Therapy: Patient Spontanous Breathing and Patient connected to nasal cannula oxygen  Post-op Assessment: Report given to RN and Post -op Vital signs reviewed and stable  Post vital signs: Reviewed and stable  Last Vitals:  Vitals Value Taken Time  BP 116/66 04/15/23 1522  Temp 36.7 C 04/15/23 1522  Pulse 76 04/15/23 1525  Resp 25 04/15/23 1525  SpO2 93 % 04/15/23 1525  Vitals shown include unvalidated device data.  Last Pain:  Vitals:   04/15/23 1113  TempSrc:   PainSc: 0-No pain      Patients Stated Pain Goal: 0 (04/15/23 1113)  Complications: No notable events documented.

## 2023-04-15 NOTE — Anesthesia Preprocedure Evaluation (Signed)
Anesthesia Evaluation  Patient identified by MRN, date of birth, ID band Patient awake    Reviewed: Allergy & Precautions, NPO status , Patient's Chart, lab work & pertinent test results  Airway Mallampati: II  TM Distance: >3 FB Neck ROM: Full    Dental  (+) Dental Advisory Given   Pulmonary neg pulmonary ROS   breath sounds clear to auscultation       Cardiovascular hypertension, Pt. on medications  Rhythm:Regular Rate:Normal     Neuro/Psych negative neurological ROS     GI/Hepatic Neg liver ROS,GERD  ,,  Endo/Other    Morbid obesity  Renal/GU Renal disease     Musculoskeletal  (+) Arthritis ,    Abdominal   Peds  Hematology negative hematology ROS (+)   Anesthesia Other Findings   Reproductive/Obstetrics                             Anesthesia Physical Anesthesia Plan  ASA: 3  Anesthesia Plan: General   Post-op Pain Management: Regional block* and Tylenol PO (pre-op)*   Induction: Intravenous  PONV Risk Score and Plan: 3 and Dexamethasone, Ondansetron and Treatment may vary due to age or medical condition  Airway Management Planned: Oral ETT  Additional Equipment: None  Intra-op Plan:   Post-operative Plan: Extubation in OR  Informed Consent: I have reviewed the patients History and Physical, chart, labs and discussed the procedure including the risks, benefits and alternatives for the proposed anesthesia with the patient or authorized representative who has indicated his/her understanding and acceptance.     Dental advisory given  Plan Discussed with: CRNA  Anesthesia Plan Comments:        Anesthesia Quick Evaluation

## 2023-04-15 NOTE — H&P (Signed)
Faith Reeves is an 72 y.o. female.   Chief Complaint: right shoulder pain HPI:  Faith Reeves is a 72 y.o. female who presents  reporting right shoulder pain.  She underwent MRI scan of the right shoulder 12/26/2022.  That scan showed severe tendinosis of the supraspinatus tendon with complete tear and nearly 4 cm of retraction with severe tendinosis of the infraspinatus tendon as well as large joint effusion with synovitis and tendinosis of the intra-articular portion of the biceps tendon.  She describes decreased range of motion.  She is able to lay on the right-hand side.  Takes gabapentin for pain.  She uses a cane in the right hand for hip replacement but more for balance as opposed to loadbearing.  Had an injection 10 months ago.  She injured her shoulder when she was picking up her son a lot who had an aneurysm.  Does wake her from sleep most nights.  She is right-hand dominant.  Left shoulder and hand is very functional..   Past Medical History:  Diagnosis Date   Allergy    Anemia    Anxiety    Arthritis    Blood transfusion without reported diagnosis    Cataract    GERD (gastroesophageal reflux disease)    Hypertension    Hypertensive retinopathy    OU   Macular degeneration     Past Surgical History:  Procedure Laterality Date   ABDOMINAL HYSTERECTOMY     ACHILLES TENDON REPAIR Bilateral    APPENDECTOMY     BACK SURGERY     BILATERAL CARPAL TUNNEL RELEASE     CHOLECYSTECTOMY     COLONOSCOPY     EYE SURGERY Right 09/18/2022   cataract extraction   HERNIA REPAIR     JOINT REPLACEMENT     MELANOMA EXCISION Right 09/15/2017   Procedure: EXCISION LIPOSARCOMA RIGHT ARM;  Surgeon: Violeta Gelinas, MD;  [facility];  Service: General;  Laterality: Right;   SPINE SURGERY      Family History  Problem Relation Age of Onset   Hypertension Mother    Cancer Sister    Stroke Maternal Grandmother    Social History:  reports that she has never smoked. She has never used smokeless  tobacco. She reports current alcohol use of about 1.0 standard drink of alcohol per week. She reports that she does not use drugs.  Allergies:  Allergies  Allergen Reactions   Cymbalta [Duloxetine Hcl] Swelling    Medications Prior to Admission  Medication Sig Dispense Refill   celecoxib (CELEBREX) 200 MG capsule TAKE 1 CAPSULE BY MOUTH EVERY DAY (Patient taking differently: Take 200 mg by mouth every other day.) 90 capsule 3   gabapentin (NEURONTIN) 300 MG capsule Take 1 capsule by mouth three times a day. 90 capsule 5   ipratropium (ATROVENT) 0.06 % nasal spray SPRAY 2 SPRAYS INTO EACH NOSTRIL 4 TIMES A DAY. 15 mL 1   levocetirizine (XYZAL) 5 MG tablet TAKE 1 TABLET BY MOUTH EVERY DAY IN THE EVENING 90 tablet 1   Multiple Vitamins-Minerals (EQ VISION FORMULA 50+ PO) Take 1 capsule by mouth daily.     oxyCODONE-acetaminophen (PERCOCET) 10-325 MG tablet Take 1 tablet by mouth every 4 (four) hours as needed for pain (G89.4). (Patient taking differently: Take 1 tablet by mouth at bedtime.) 90 tablet 0   pantoprazole (PROTONIX) 40 MG tablet Take 1 tablet (40 mg total) by mouth daily. 90 tablet 0   sertraline (ZOLOFT) 50 MG tablet TAKE 1 AND  1/2 TABLETS DAILY BY MOUTH 135 tablet 1   telmisartan-hydrochlorothiazide (MICARDIS HCT) 80-12.5 MG tablet Take 1 tablet by mouth daily. 90 tablet 1   zolpidem (AMBIEN) 10 MG tablet TAKE 1 TABLET BY MOUTH EVERY DAY AT BEDTIME AS NEEDED FOR SLEEP (Patient taking differently: Take 10 mg by mouth at bedtime.) 30 tablet 5   albuterol (PROVENTIL HFA;VENTOLIN HFA) 108 (90 Base) MCG/ACT inhaler Inhale 2 puffs into the lungs every 4 (four) hours as needed for wheezing or shortness of breath. 1 Inhaler 0   ALPRAZolam (XANAX) 0.5 MG tablet Take 1 tablet (0.5 mg total) by mouth 3 (three) times daily as needed for anxiety. 30 tablet 0   methocarbamol (ROBAXIN) 500 MG tablet Take 1 tablet (500 mg total) by mouth 2 (two) times daily as needed for muscle spasms. 20 tablet 2    mometasone (ELOCON) 0.1 % cream APPLY TO AFFECTED AREA TOPICALLY EVERY DAY 45 g 2   predniSONE (STERAPRED UNI-PAK 21 TAB) 10 MG (21) TBPK tablet Take as directed (Patient not taking: Reported on 04/04/2023) 21 tablet 0   valACYclovir (VALTREX) 1000 MG tablet Take 2 tablets (2,000 mg total) by mouth 2 (two) times daily. 4 tablet 2    No results found for this or any previous visit (from the past 48 hour(s)). No results found.  Review of Systems  Musculoskeletal:  Positive for arthralgias.  All other systems reviewed and are negative.   Blood pressure (!) 148/76, pulse 96, temperature 98.8 F (37.1 C), temperature source Oral, resp. rate 16, height 4\' 11"  (1.499 m), weight 106.6 kg, SpO2 95 %. Physical Exam Vitals reviewed.  HENT:     Head: Normocephalic.     Nose: Nose normal.     Mouth/Throat:     Mouth: Mucous membranes are moist.  Eyes:     Pupils: Pupils are equal, round, and reactive to light.  Cardiovascular:     Rate and Rhythm: Normal rate.     Pulses: Normal pulses.  Pulmonary:     Effort: Pulmonary effort is normal.  Abdominal:     General: Abdomen is flat.  Musculoskeletal:     Cervical back: Normal range of motion.  Skin:    General: Skin is warm.     Capillary Refill: Capillary refill takes less than 2 seconds.  Neurological:     General: No focal deficit present.     Mental Status: She is alert.  Psychiatric:        Mood and Affect: Mood normal.     Ortho exam demonstrates on the right-hand side active abduction to about 60 degrees.  Passive range of motion is approximately 50/90/160.  Deltoid is functional.  Motor or sensory function to the hand is intact.  No masses lymphadenopathy or skin changes noted in that right shoulder girdle region   Assessment/Plan Impression is right shoulder pain with synovitis a component of arthritis as well as retracted and unrepairable rotator cuff tear. Her most predictable outcome would be with reverse shoulder  replacement. She is having pain as well as limitation of overhead motion and strength. Only has about 60 degrees of active abduction. Discussed with the use of models as well as rationale behind reverse shoulder replacement. The risk and benefits of the procedure including not limited to infection or vessel damage dislocation as well as incomplete functional relief and incomplete pain relief were all discussed. Patient understands risk and benefits and wishes to proceed with surgical intervention. Thin cut CT scan for patient specific  instrumentation for the reverse replacement has been done.Burnard Bunting, MD 04/15/2023, 10:41 AM

## 2023-04-15 NOTE — Anesthesia Procedure Notes (Signed)
Procedure Name: Intubation Date/Time: 04/15/2023 11:58 AM  Performed by: Nils Pyle, CRNAPre-anesthesia Checklist: Patient identified, Emergency Drugs available, Suction available and Patient being monitored Patient Re-evaluated:Patient Re-evaluated prior to induction Oxygen Delivery Method: Circle System Utilized Preoxygenation: Pre-oxygenation with 100% oxygen Induction Type: IV induction Ventilation: Mask ventilation without difficulty Laryngoscope Size: Miller and 2 Grade View: Grade II Tube type: Oral Tube size: 7.0 mm Number of attempts: 1 Airway Equipment and Method: Stylet and Oral airway Placement Confirmation: ETT inserted through vocal cords under direct vision, positive ETCO2 and breath sounds checked- equal and bilateral Secured at: 22 cm Tube secured with: Tape Dental Injury: Teeth and Oropharynx as per pre-operative assessment

## 2023-04-15 NOTE — Op Note (Unsigned)
Faith Reeves, Faith Reeves MEDICAL RECORD NO: [id number] ACCOUNT NO: [id number] DATE OF BIRTH: 10-Sep-1951 FACILITY: [facility] LOCATION: [facility] PHYSICIAN: Graylin Shiver. August Saucer, MD  Operative Report   DATE OF PROCEDURE: 04/15/2023  PREOPERATIVE DIAGNOSIS:  Right shoulder arthritis and rotator cuff arthropathy.  POSTOPERATIVE DIAGNOSIS: Right shoulder arthritis and rotator cuff arthropathy .  PROCEDURE:  Right shoulder reverse shoulder replacement and biceps tenodesis using Biomet comprehensive reverse shoulder system, small augmented baseplate with 36 standard glenosphere, size 10 mini humeral stem and mini humeral tray +6 taper offset, 40  mm in diameter with +3 thickness polyethylene bearing.  SURGEON:  Graylin Shiver. August Saucer, MD  ASSISTANT:  Karenann Cai, PA.  INDICATIONS:  This is a patient with right shoulder pain, who presents for operative management after explanation of risks and benefits.  DESCRIPTION OF PROCEDURE:  The patient was brought to the operating room where general anesthetic was induced.  Preoperative antibiotics administered.  Timeout was called.  TXA was administered.  The patient was placed in the beach chair position with  head in neutral position.  The patient's right shoulder, arm and hand prescrubbed with hydrogen peroxide followed by alcohol and Betadine, which was allowed to air dry, then prepped with ChloraPrep solution and draped in sterile manner.  Faith Reeves was used  to cover the operative field.  After calling timeout, deltopectoral approach was made.  Skin and subcutaneous tissue were sharply divided.  Cephalic vein mobilized laterally.  One branch to the deltoid was avulsed and that required ligation around the  mid portion of the cephalic vein.  The patient had significant rotator cuff pathology.  Deltoid was elevated manually off its anterior attachment.  The axillary nerve was palpated and protected at all times during the case.  Subdeltoid and subacromial  adhesions were  released manually.  The pectoralis major tendon was released about superior 1.5 cm.  Biceps was then tenodesed to the stump of that pec tendon using 5-0 Vicryl sutures.  The tendon was then released and brought up through the bicipital  groove and into the rotator interval.  At this time, the circumflex vessels were ligated.  Subscapularis tendon was detached using a 15 blade and that dissection was carried around using the cautery to the inferior surface of the humeral neck about 2 cm below the articular surface.   This was done with a Cobb elevator around to the 5 o'clock position.  Head was dislocated.  Rotator cuff pathology was present along with severe glenohumeral arthritis.  Next, the reaming was performed up to size 10 mm.  The head was then cut in  approximately 30 degrees of retroversion.  Broaching performed up to a size 10.  This gave a good fit.  Cap was placed.  Posterior retractor was placed.  Anterior retractor was placed.  Then, while protecting the axillary nerve the labrum was  circumferentially excised.  Bankart lesion created from the 12 o'clock to 6 o'clock position, the patient-specific guide was placed and drilling was performed and then subsequent reaming was performed for a small augment.  Excellent fit was obtained.   The small baseplate was placed after irrigating the entire shoulder joint with IrriSept solution.  We got 1 central compression screw with excellent fixation and 3 peripheral locking screws.  A trial reduction was performed with the 36 standard glenosphere followed by a +6 offset humeral tray with a  standard insert and +3 insert.  The +3 insert gave optimal stability.  This was with extension, adduction and forward force along  with internal and external rotation at 90 degrees of abduction.  The reduction was "two fingers tight."  Hard to dislocate,  hard to relocate.  Shoulder was dislocated and the trial components were removed.  Thorough irrigation was performed.   A 4 suture tapes were used in the lesser tuberosity due to the fairly degenerative subscap tendon, but we were able to repair it.  The  glenosphere was placed with 1.5 mm inferior offset.  Then, we placed an IrriSept solution into the canal and sucked that out and then placed some vancomycin powder in along with some bone graft to make sure that size 10 humerus was a very nice snug fit,  which it was. Then, we reduced it again with the trial reduction of the +6 tray and +3 bearing and that gave excellent stability.  The true components were placed.  Axillary nerve again palpated and found to be intact.  We then irrigated with 4 liters of  irrigating solution followed by IrriSept solution followed by vancomycin powder placed on the implant.  Deltopectoral interval was then closed using #1 Vicryl suture followed by interrupted inverted 0 Vicryl suture, 2-0 Vicryl suture, and 3-0 Monocryl  with Steri-Strips and Aquacel dressing applied.  Luke's assistance was required at all times for retraction, opening, closing, mobilization of tissue.  His assistance was a medical necessity.   PUS D: 04/15/2023 2:59:48 pm T: 04/15/2023 3:28:00 pm  JOB: [id number]/ [id number]

## 2023-04-16 DIAGNOSIS — Z96698 Presence of other orthopedic joint implants: Secondary | ICD-10-CM | POA: Diagnosis not present

## 2023-04-16 DIAGNOSIS — Z79899 Other long term (current) drug therapy: Secondary | ICD-10-CM | POA: Diagnosis not present

## 2023-04-16 DIAGNOSIS — M7521 Bicipital tendinitis, right shoulder: Secondary | ICD-10-CM | POA: Diagnosis not present

## 2023-04-16 DIAGNOSIS — I10 Essential (primary) hypertension: Secondary | ICD-10-CM | POA: Diagnosis not present

## 2023-04-16 DIAGNOSIS — M19011 Primary osteoarthritis, right shoulder: Secondary | ICD-10-CM | POA: Diagnosis not present

## 2023-04-16 MED ORDER — OXYCODONE HCL 5 MG PO TABS: 5.0000 mg | ORAL_TABLET | ORAL | 0 refills | Status: DC | PRN

## 2023-04-16 MED ORDER — DOCUSATE SODIUM 100 MG PO CAPS: 100.0000 mg | ORAL_CAPSULE | Freq: Two times a day (BID) | ORAL | 0 refills | Status: AC

## 2023-04-16 MED ORDER — ASPIRIN 81 MG PO TBEC: 81.0000 mg | DELAYED_RELEASE_TABLET | Freq: Every day | ORAL | 0 refills | Status: DC

## 2023-04-16 NOTE — Progress Notes (Signed)
PT Cancellation Note and Discharge  Patient Details Name: Faith Reeves MRN: [id number] DOB: Oct 28, 1951   Cancelled Treatment:    Reason Eval/Treat Not Completed: PT screened, no needs identified, will sign off. Discussed pt case with OT who reports pt is currently mobilizing without assistance and does not require a formal PT evaluation at this time. PT signing off. If needs change, please reconsult.     Marylynn Pearson 04/16/2023, 9:51 AM  Conni Slipper, PT, DPT Acute Rehabilitation Services Secure Chat Preferred Office: [phone]

## 2023-04-16 NOTE — Progress Notes (Signed)
Orthopedic Tech Progress Note Patient Details:  Faith Reeves 1951/09/12 [id number]  RN stopped me this morning asking for a smaller ARM SLING for patient per PT. Patient had on a XL and needed a LARGE   Ortho Devices Type of Ortho Device: Arm sling Ortho Device/Splint Location: RUE Ortho Device/Splint Interventions: Ordered, Application, Adjustment   Post Interventions Patient Tolerated: Well Instructions Provided: Care of device  Donald Pore 04/16/2023, 10:02 AM

## 2023-04-16 NOTE — Anesthesia Postprocedure Evaluation (Signed)
Anesthesia Post Note  Patient: Faith Reeves  Procedure(s) Performed: RIGHT REVERSE SHOULDER ARTHROPLASTY (Right: Shoulder) RIGHT BICEPS TENODESIS (Right: Shoulder)     Patient location during evaluation: PACU Anesthesia Type: General Level of consciousness: awake and alert Pain management: pain level controlled Vital Signs Assessment: post-procedure vital signs reviewed and stable Respiratory status: spontaneous breathing, nonlabored ventilation, respiratory function stable and patient connected to nasal cannula oxygen Cardiovascular status: blood pressure returned to baseline and stable Postop Assessment: no apparent nausea or vomiting Anesthetic complications: no   No notable events documented.  Last Vitals:  Vitals:   04/16/23 0730 04/16/23 1018  BP: 107/74   Pulse: 81 80  Resp: 16   Temp: 36.4 C   SpO2: 95% 96%    Last Pain:  Vitals:   04/16/23 0815  TempSrc:   PainSc: 0-No pain                 Kennieth Rad

## 2023-04-16 NOTE — Progress Notes (Signed)
  Subjective: Faith Reeves is a 72 y.o. female s/p right RSA.  They are POD1.  Pt's pain is controlled.  Patient denies any complaints of chest pain, abdominal pain. Having some SOB on occasion. Off oxygen now when sedentary but needs it when ambulatory. Had nebulizer treatment this AM. Block still working  Objective: Vital signs in last 24 hours: Temp:  [97.6 F (36.4 C)-98.8 F (37.1 C)] 97.6 F (36.4 C) (06/12 0730) Pulse Rate:  [63-106] 81 (06/12 0730) Resp:  [16-26] 16 (06/12 0730) BP: (101-148)/(63-76) 107/74 (06/12 0730) SpO2:  [90 %-97 %] 95 % (06/12 0730) Weight:  [106.6 kg] 106.6 kg (06/11 1018)  Intake/Output from previous day: 06/11 0701 - 06/12 0700 In: 1640 [P.O.:240; I.V.:1200; IV Piggyback:200] Out: 250 [Blood:250] Intake/Output this shift: No intake/output data recorded.  Exam:  No gross blood or drainage overlying the dressing 2+ radial pulse of the operative extremity Postoperative physical exam somewhat limited by interscalene block but intact EPL, FPL, finger abduction, grip, pronation/supination. No bicep flexion or tricep extension.    Labs: No results for input(s): "HGB" in the last 72 hours. No results for input(s): "WBC", "RBC", "HCT", "PLT" in the last 72 hours. No results for input(s): "NA", "K", "CL", "CO2", "BUN", "CREATININE", "GLUCOSE", "CALCIUM" in the last 72 hours. No results for input(s): "LABPT", "INR" in the last 72 hours.  Assessment/Plan: Pt is POD1 s/p right RSA    -Plan to discharge to home today or tomorrow pending patient's pain and O2 sat  -No lifting with the operative arm  -Stay in sling except for showering/sleeping and using CPM machine at home.  No lifting with the operative arm more than 1 to 2 pounds  -Follow-up with Dr. August Saucer in clinic 2 weeks postoperatively     [facility] 04/16/2023, 8:35 AM

## 2023-04-16 NOTE — Evaluation (Addendum)
Occupational Therapy Evaluation Patient Details Name: [place] Judah MRN: [id number] DOB: [birth year] Today's Date: 04/16/2023   History of Present Illness Pt is a 72 y.o. female s/p R reverse shoulder arthroplasty with right biceps tenodesis. PMH significant for anemia, arthritis, cataract, GERD, HTN, hypertensive retinopathy, macular degeneration, back surgery, bil carpal tunnel release, achilles tendon repair.   Clinical Impression   Pt required up to mod A to don/doff shoulder immobilizer with education provided to maintain sling all of the time with the exception of bathing, dressing and when completing exercises until the surgeon states otherwise. Elbow/wrist/hand AAROM exercises completed with supervision and HEP provided, see details below. Pt was educated on how to complete UB ADLs while maintaining shoulder ROM restrictions with compensatory techniques, pt needs up to min A to don shirt and undergarments and complete UB bathing. Overall pt also needed min A for LB ADLs and min guard for functional mobility with cane. Shoulder protocol educational hand out provided to encourage carry-over at discharge. Sling observed to be too large; RN notified. Pt will benefit from acute OT with plan to follow the surgeons follow up therapy plan.   Pt with desat during functional mobility to 87% SpO2; RN aware. Pt demonstrating good insight into needs for rest breaks.    Recommendations for follow up therapy are one component of a multi-disciplinary discharge planning process, led by the attending physician.  Recommendations may be updated based on patient status, additional functional criteria and insurance authorization.   Assistance Recommended at Discharge Intermittent Supervision/Assistance  Patient can return home with the following A little help with bathing/dressing/bathroom;Help with stairs or ramp for entrance;Assist for transportation;Assistance with cooking/housework    Functional Status  Assessment  Patient has had a recent decline in their functional status and demonstrates the ability to make significant improvements in function in a reasonable and predictable amount of time.  Equipment Recommendations  BSC/3in1    Recommendations for Other Services       Precautions / Restrictions Precautions Precautions: Shoulder Type of Shoulder Precautions: per orders, NWB, ok for pendulums, AROM of elbow/wrist/hand. ER 0-30, otherwise no AROM of shoulder. Sling on at all times except ADL/exercise Shoulder Interventions: Shoulder sling/immobilizer Precaution Booklet Issued: Yes (comment) Precaution Comments: All precautions reviewed and education provided Restrictions Weight Bearing Restrictions: Yes RUE Weight Bearing: Non weight bearing      Mobility Bed Mobility Overal bed mobility: Needs Assistance Bed Mobility: Supine to Sit     Supine to sit: Supervision, HOB elevated     General bed mobility comments: reviewed compensatory techniques for lying in flat bed, but pt reports plan to stay in recliner temporarily    Transfers Overall transfer level: Needs assistance Equipment used: None Transfers: Sit to/from Stand Sit to Stand: Supervision           General transfer comment: for safety; no physical assist needed      Balance Overall balance assessment: Mild deficits observed, not formally tested                                         ADL either performed or assessed with clinical judgement   ADL Overall ADL's : Needs assistance/impaired Eating/Feeding: Set up;Sitting   Grooming: Supervision/safety;Standing   Upper Body Bathing: Set up;Sitting;Adhering to UE precautions   Lower Body Bathing: Minimal assistance;Sit to/from stand   Upper Body Dressing : Moderate assistance;Sitting Upper Body Dressing Details (  indicate cue type and reason): for undergarment and sling application; able to don shirt Lower Body Dressing: Minimal  assistance Lower Body Dressing Details (indicate cue type and reason): to bring pants around R hip and button Toilet Transfer: Min guard;Ambulation (cane)   Toileting- Clothing Manipulation and Hygiene: Ecologist Details (indicate cue type and reason): Pt deferred, but reviewed compensatory strategies and pt reporting husband can locate tub bench she had for their son to optimize safety Functional mobility during ADLs: Min guard;Cane General ADL Comments: Pt desatting during ambulation into hall to 87% SpO2; pt observed with speedy recovery.     Vision Baseline Vision/History: 6 Macular Degeneration;1 Wears glasses Ability to See in Adequate Light: 1 Impaired Patient Visual Report: No change from baseline Vision Assessment?: Vision impaired- to be further tested in functional context Additional Comments: macular degeneration at baselin; pt reports inability to read small prints     Perception Perception Perception Tested?: No   Praxis Praxis Praxis tested?: Not tested    Pertinent Vitals/Pain Pain Assessment Pain Assessment: Faces Faces Pain Scale: Hurts little more Pain Location: R shoulder Pain Descriptors / Indicators: Aching, Operative site guarding Pain Intervention(s): Limited activity within patient's tolerance, Monitored during session     Hand Dominance Right   Extremity/Trunk Assessment Upper Extremity Assessment Upper Extremity Assessment: RUE deficits/detail RUE Deficits / Details: nerve block continues to be active. Pt with able to grasp and perform wrist flexion/extension. minimal active elbow flexion and pronation/supination at time of eval. Sensation intact RUE Coordination: decreased gross motor   Lower Extremity Assessment Lower Extremity Assessment: Overall WFL for tasks assessed   Cervical / Trunk Assessment Cervical / Trunk Assessment: Other exceptions (large body habitus)   Communication  Communication Communication: No difficulties   Cognition Arousal/Alertness: Awake/alert Behavior During Therapy: WFL for tasks assessed/performed Overall Cognitive Status: Within Functional Limits for tasks assessed                                 General Comments: good insight into need for rest breaks, etc     General Comments  VSS. Husband present and reporting he can assist at home    Exercises Exercises: Shoulder Shoulder Exercises Pendulum Exercise: PROM, Right, 10 reps, Seated Elbow Flexion: AAROM, Right, 10 reps Elbow Extension: AAROM, Right, 10 reps Wrist Flexion: AROM, Right, 10 reps Wrist Extension: AROM, Right, 10 reps Digit Composite Flexion: AROM, Right, 10 reps Composite Extension: AROM, Right, 10 reps   Shoulder Instructions Shoulder Instructions Donning/doffing shirt without moving shoulder: Supervision/safety Method for sponge bathing under operated UE: Supervision/safety Donning/doffing sling/immobilizer: Moderate assistance;Caregiver independent with task;Patient able to independently direct caregiver Correct positioning of sling/immobilizer: Supervision/safety Pendulum exercises (written home exercise program): Supervision/safety ROM for elbow, wrist and digits of operated UE: Supervision/safety Sling wearing schedule (on at all times/off for ADL's): Modified independent (reviewed) Proper positioning of operated UE when showering: Modified independent Positioning of UE while sleeping: Modified independent    Home Living Family/patient expects to be discharged to:: Private residence Living Arrangements: Spouse/significant other Available Help at Discharge: Family Type of Home: House Home Access: Other (comment) (small threshold)     Home Layout: One level     Bathroom Shower/Tub: Chief Strategy Officer: Handicapped height     Home Equipment: Tub bench          Prior Functioning/Environment Prior Level of Function :  Independent/Modified Independent  Mobility Comments: cane within the home, rollator in community ADLs Comments: Pt reports husband does most of the driving.        OT Problem List: Decreased strength;Impaired balance (sitting and/or standing);Decreased activity tolerance;Decreased range of motion;Decreased knowledge of precautions;Impaired UE functional use      OT Treatment/Interventions:      OT Goals(Current goals can be found in the care plan section) Acute Rehab OT Goals Patient Stated Goal: get better so I can hold my grandchild OT Goal Formulation: With patient Time For Goal Achievement: 04/30/23 Potential to Achieve Goals: Good  OT Frequency:      Co-evaluation              AM-PAC OT "6 Clicks" Daily Activity     Outcome Measure Help from another person eating meals?: A Little Help from another person taking care of personal grooming?: A Little Help from another person toileting, which includes using toliet, bedpan, or urinal?: A Little Help from another person bathing (including washing, rinsing, drying)?: A Little Help from another person to put on and taking off regular upper body clothing?: A Lot Help from another person to put on and taking off regular lower body clothing?: A Little 6 Click Score: 17   End of Session Equipment Utilized During Treatment: Other (comment) (cane, sling) Nurse Communication: Mobility status  Activity Tolerance: Patient tolerated treatment well Patient left: in bed;with call bell/phone within reach;with family/visitor present  OT Visit Diagnosis: Unsteadiness on feet (R26.81);Muscle weakness (generalized) (M62.81)                Time: 1610-9604 OT Time Calculation (min): 53 min Charges:  OT General Charges $OT Visit: 1 Visit OT Evaluation $OT Eval Low Complexity: 1 Low OT Treatments $Self Care/Home Management : 23-37 mins $Therapeutic Exercise: 8-22 mins  Myrla Halsted, OTD, OTR/L [facility] Acute  Rehabilitation Office: [phone]   Myrla Halsted 04/16/2023, 11:19 AM

## 2023-04-16 NOTE — Plan of Care (Signed)
Pt doing well. Pt and husband given D/C instructions with verbal understanding. Rx's were sent to the pharmacy by MD. Pt's incision is clean and dry with no sign of infection. Pt's IV was removed prior to D/C. Pt received 3-n-1 from [facility] per MD order. Pt is stable @ D/C and has no other needs at this time. Rema Fendt, RN

## 2023-04-17 ENCOUNTER — Encounter (HOSPITAL_COMMUNITY): Payer: Self-pay | Admitting: Orthopedic Surgery

## 2023-04-18 ENCOUNTER — Other Ambulatory Visit: Payer: Self-pay | Admitting: *Deleted

## 2023-04-18 ENCOUNTER — Telehealth: Payer: Self-pay | Admitting: *Deleted

## 2023-04-18 DIAGNOSIS — Z96611 Presence of right artificial shoulder joint: Secondary | ICD-10-CM

## 2023-04-18 NOTE — Telephone Encounter (Signed)
Ortho bundle D/C call completed. 

## 2023-04-28 NOTE — Discharge Summary (Signed)
Physician Discharge Summary      Patient ID: Faith Reeves MRN: [id number] DOB/AGE: [birth year] 74 y.o.  Admit date: 04/15/2023 Discharge date: 04/16/2023  Admission Diagnoses:  Principal Problem:   S/P reverse total shoulder arthroplasty, right   Discharge Diagnoses:  Same  Surgeries: Procedure(s): RIGHT REVERSE SHOULDER ARTHROPLASTY RIGHT BICEPS TENODESIS on 04/15/2023   Consultants:   Discharged Condition: Stable  Hospital Course: Faith Reeves is an 72 y.o. female who was admitted 04/15/2023 with a chief complaint of right shoulder pain, and found to have a diagnosis of right shoulder osteoarthritis.  They were brought to the operating room on 04/15/2023 and underwent the above named procedures.  Pt awoke from anesthesia without complication and was transferred to the floor. On POD1, patient's pain was overall controlled.  Blocks no effect.  Did have some decrease in her oxygen saturation requiring supplemental oxygen but this improved as her interscalene block wore off.  She is able to ambulate without oxygen and she was discharged home on POD 1.  No red flag signs or symptoms..  Pt will f/u with Dr. August Saucer in clinic in ~2 weeks.   Antibiotics given:  Anti-infectives (From admission, onward)    Start     Dose/Rate Route Frequency Ordered Stop   04/15/23 1700  ceFAZolin (ANCEF) IVPB 2g/100 mL premix        2 g 200 mL/hr over 30 Minutes Intravenous Every 8 hours 04/15/23 1614 04/16/23 0615   04/15/23 1232  vancomycin (VANCOCIN) powder  Status:  Discontinued          As needed 04/15/23 1233 04/15/23 1516   04/15/23 1030  ceFAZolin (ANCEF) IVPB 2g/100 mL premix        2 g 200 mL/hr over 30 Minutes Intravenous On call to O.R. 04/15/23 1015 04/15/23 1215     .  Recent vital signs:  Vitals:   04/16/23 0730 04/16/23 1018  BP: 107/74   Pulse: 81 80  Resp: 16   Temp: 97.6 F (36.4 C)   SpO2: 95% 96%    Recent laboratory studies:  Results for orders placed or performed during  the hospital encounter of 04/08/23  Urine Culture (for pregnant, neutropenic or urologic patients or patients with an indwelling urinary catheter)   Specimen: Urine, Clean Catch  Result Value Ref Range   Specimen Description URINE, CLEAN CATCH    Special Requests NONE    Culture (A)     <10,000 COLONIES/mL INSIGNIFICANT GROWTH Performed at [facility], [address], [place], [place] [address]    Report Status 04/09/2023 FINAL   Surgical pcr screen   Specimen: Nasal Mucosa; Nasal Swab  Result Value Ref Range   MRSA, PCR NEGATIVE NEGATIVE   Staphylococcus aureus NEGATIVE NEGATIVE  CBC  Result Value Ref Range   WBC 7.0 4.0 - 10.5 K/uL   RBC 5.02 3.87 - 5.11 MIL/uL   Hemoglobin 14.5 12.0 - 15.0 g/dL   HCT 95.2 84.1 - 32.4 %   MCV 90.6 80.0 - 100.0 fL   MCH 28.9 26.0 - 34.0 pg   MCHC 31.9 30.0 - 36.0 g/dL   RDW 40.1 02.7 - 25.3 %   Platelets 227 150 - 400 K/uL   nRBC 0.0 0.0 - 0.2 %  Basic metabolic panel  Result Value Ref Range   Sodium 137 135 - 145 mmol/L   Potassium 3.5 3.5 - 5.1 mmol/L   Chloride 100 98 - 111 mmol/L   CO2 29 22 - 32 mmol/L  Glucose, Bld 90 70 - 99 mg/dL   BUN 18 8 - 23 mg/dL   Creatinine, Ser 1.61 (H) 0.44 - 1.00 mg/dL   Calcium 9.1 8.9 - 09.6 mg/dL   GFR, Estimated 51 (L) >60 mL/min   Anion gap 8 5 - 15  Urinalysis, Routine w reflex microscopic -Urine, Clean Catch  Result Value Ref Range   Color, Urine YELLOW YELLOW   APPearance HAZY (A) CLEAR   Specific Gravity, Urine 1.016 1.005 - 1.030   pH 6.0 5.0 - 8.0   Glucose, UA NEGATIVE NEGATIVE mg/dL   Hgb urine dipstick NEGATIVE NEGATIVE   Bilirubin Urine NEGATIVE NEGATIVE   Ketones, ur NEGATIVE NEGATIVE mg/dL   Protein, ur NEGATIVE NEGATIVE mg/dL   Nitrite NEGATIVE NEGATIVE   Leukocytes,Ua MODERATE (A) NEGATIVE   RBC / HPF 0-5 0 - 5 RBC/hpf   WBC, UA 11-20 0 - 5 WBC/hpf   Bacteria, UA RARE (A) NONE SEEN   Squamous Epithelial / HPF 0-5 0 - 5 /HPF   Non Squamous Epithelial 0-5 (A) NONE  SEEN    Discharge Medications:   Allergies as of 04/16/2023       Reactions   Cymbalta [duloxetine Hcl] Swelling        Medication List     STOP taking these medications    oxyCODONE-acetaminophen 10-325 MG tablet Commonly known as: PERCOCET   predniSONE 10 MG (21) Tbpk tablet Commonly known as: STERAPRED UNI-PAK 21 TAB       TAKE these medications    albuterol 108 (90 Base) MCG/ACT inhaler Commonly known as: VENTOLIN HFA Inhale 2 puffs into the lungs every 4 (four) hours as needed for wheezing or shortness of breath.   ALPRAZolam 0.5 MG tablet Commonly known as: XANAX Take 1 tablet (0.5 mg total) by mouth 3 (three) times daily as needed for anxiety.   aspirin EC 81 MG tablet Take 1 tablet (81 mg total) by mouth daily. Swallow whole.   celecoxib 200 MG capsule Commonly known as: CELEBREX TAKE 1 CAPSULE BY MOUTH EVERY DAY What changed:  how much to take when to take this   docusate sodium 100 MG capsule Commonly known as: COLACE Take 1 capsule (100 mg total) by mouth 2 (two) times daily.   EQ VISION FORMULA 50+ PO Take 1 capsule by mouth daily.   gabapentin 300 MG capsule Commonly known as: NEURONTIN Take 1 capsule by mouth three times a day.   ipratropium 0.06 % nasal spray Commonly known as: ATROVENT SPRAY 2 SPRAYS INTO EACH NOSTRIL 4 TIMES A DAY.   levocetirizine 5 MG tablet Commonly known as: XYZAL TAKE 1 TABLET BY MOUTH EVERY DAY IN THE EVENING   methocarbamol 500 MG tablet Commonly known as: ROBAXIN Take 1 tablet (500 mg total) by mouth 2 (two) times daily as needed for muscle spasms.   mometasone 0.1 % cream Commonly known as: ELOCON APPLY TO AFFECTED AREA TOPICALLY EVERY DAY   oxyCODONE 5 MG immediate release tablet Commonly known as: Oxy IR/ROXICODONE Take 1 tablet (5 mg total) by mouth every 4 (four) hours as needed for moderate pain (pain score 4-6).   pantoprazole 40 MG tablet Commonly known as: PROTONIX Take 1 tablet (40 mg  total) by mouth daily.   sertraline 50 MG tablet Commonly known as: ZOLOFT TAKE 1 AND 1/2 TABLETS DAILY BY MOUTH   telmisartan-hydrochlorothiazide 80-12.5 MG tablet Commonly known as: MICARDIS HCT Take 1 tablet by mouth daily.   valACYclovir 1000 MG tablet Commonly known  as: VALTREX Take 2 tablets (2,000 mg total) by mouth 2 (two) times daily.   zolpidem 10 MG tablet Commonly known as: AMBIEN TAKE 1 TABLET BY MOUTH EVERY DAY AT BEDTIME AS NEEDED FOR SLEEP What changed:  how much to take how to take this when to take this additional instructions        Diagnostic Studies: DG Shoulder Right Port  Result Date: 04/15/2023 CLINICAL DATA:  Status post reverse total right shoulder arthroplasty. EXAM: RIGHT SHOULDER - 1 VIEW COMPARISON:  Right shoulder radiographs 03/13/2022 FINDINGS: Interval reverse total right shoulder arthroplasty. No perihardware lucency is seen to indicate hardware failure or loosening. Mild postoperative subcutaneous air. Mild acromioclavicular joint space narrowing and peripheral osteophytosis. No acute fracture or dislocation. IMPRESSION: Interval reverse total right shoulder arthroplasty without evidence of hardware failure. Electronically Signed   By: Neita Garnet M.D.   On: 04/15/2023 17:49   Intravitreal Injection, Pharmacologic Agent - OD - Right Eye  Result Date: 04/09/2023 Time Out 04/09/2023. 2:21 PM. Confirmed correct patient, procedure, site, and patient consented. Anesthesia Topical anesthesia was used. Anesthetic medications included Lidocaine 2%, Proparacaine 0.5%. Procedure Preparation included 5% betadine to ocular surface, eyelid speculum. A (32g) needle was used. Injection: 6 mg faricimab-svoa 6 MG/0.05ML   Route: Intravitreal, Site: Right Eye   NDC: O8010301, Lot: [id number], Expiration date: 02/01/2025, Waste: 0 mL Post-op Post injection exam found visual acuity of at least counting fingers. The patient tolerated the procedure well. There were no  complications. The patient received written and verbal post procedure care education. Post injection medications were not given.   OCT, Retina - OU - Both Eyes  Result Date: 04/09/2023 Right Eye Quality was good. Central Foveal Thickness: 222. Progression has been stable. Findings include normal foveal contour, no IRF, no SRF, retinal drusen , pigment epithelial detachment, outer retinal atrophy (Stable improvement in central SRF overlying low, stable PEDs). Left Eye Quality was good. Central Foveal Thickness: 269. Progression has been stable. Findings include normal foveal contour, no IRF, no SRF, retinal drusen . Notes *Images captured and stored on drive Diagnosis / Impression: OD: stable improvement in central SRF overlying low, stable PEDs OS: non-exu ARMD -- stable Clinical management: See below Abbreviations: NFP - Normal foveal profile. CME - cystoid macular edema. PED - pigment epithelial detachment. IRF - intraretinal fluid. SRF - subretinal fluid. EZ - ellipsoid zone. ERM - epiretinal membrane. ORA - outer retinal atrophy. ORT - outer retinal tubulation. SRHM - subretinal hyper-reflective material   Disposition: Discharge disposition: 01-Home or Self Care       Discharge Instructions     Call MD / Call 911   Complete by: As directed    If you experience chest pain or shortness of breath, CALL 911 and be transported to the hospital emergency room.  If you develope a fever above 101 F, pus (white drainage) or increased drainage or redness at the wound, or calf pain, call your surgeon's office.   Constipation Prevention   Complete by: As directed    Drink plenty of fluids.  Prune juice may be helpful.  You may use a stool softener, such as Colace (over the counter) 100 mg twice a day.  Use MiraLax (over the counter) for constipation as needed.   Diet - low sodium heart healthy   Complete by: As directed    Discharge instructions   Complete by: As directed    You may shower, dressing  is waterproof.  Do not bathe or soak  the operative shoulder in a tub, pool.  Use the CPM machine 3 times a day for one hour each time.  No lifting with the operative shoulder. Continue use of the sling.  Follow-up with Dr. August Saucer or Drema Pry in ~2 weeks on your given appointment date.  We will remove your adhesive bandage at that time.    Dental Antibiotics:  In most cases prophylactic antibiotics for Dental procdeures after total joint surgery are not necessary.  Exceptions are as follows:  1. History of prior total joint infection  2. Severely immunocompromised (Organ Transplant, cancer chemotherapy, Rheumatoid biologic meds such as Humera)  3. Poorly controlled diabetes (A1C &gt; 8.0, blood glucose over 200)  If you have one of these conditions, contact your surgeon for an antibiotic prescription, prior to your dental procedure.   Increase activity slowly as tolerated   Complete by: As directed    Post-operative opioid taper instructions:   Complete by: As directed    POST-OPERATIVE OPIOID TAPER INSTRUCTIONS: It is important to wean off of your opioid medication as soon as possible. If you do not need pain medication after your surgery it is ok to stop day one. Opioids include: Codeine, Hydrocodone(Norco, Vicodin), Oxycodone(Percocet, oxycontin) and hydromorphone amongst others.  Long term and even short term use of opiods can cause: Increased pain response Dependence Constipation Depression Respiratory depression And more.  Withdrawal symptoms can include Flu like symptoms Nausea, vomiting And more Techniques to manage these symptoms Hydrate well Eat regular healthy meals Stay active Use relaxation techniques(deep breathing, meditating, yoga) Do Not substitute Alcohol to help with tapering If you have been on opioids for less than two weeks and do not have pain than it is ok to stop all together.  Plan to wean off of opioids This plan should start within one week post  op of your joint replacement. Maintain the same interval or time between taking each dose and first decrease the dose.  Cut the total daily intake of opioids by one tablet each day Next start to increase the time between doses. The last dose that should be eliminated is the evening dose.             SignedKarenann Cai 04/28/2023, 9:16 AM

## 2023-04-29 ENCOUNTER — Telehealth: Payer: Self-pay

## 2023-04-29 NOTE — Telephone Encounter (Signed)
Prescription Request  04/29/2023  LOV: 12/12/22  What is the name of the medication or equipment? zolpidem (AMBIEN) 10 MG tablet [[id number]]  Have you contacted your pharmacy to request a refill? No   Which pharmacy would you like this sent to?  [id number] [place], [place] - [address] [place] [address] Phone: [phone] Fax: [phone]    Patient notified that their request is being sent to the clinical staff for review and that they should receive a response within 2 business days.   Please advise at [facility] [phone]

## 2023-04-30 ENCOUNTER — Encounter: Payer: Self-pay | Admitting: Orthopedic Surgery

## 2023-04-30 ENCOUNTER — Ambulatory Visit (INDEPENDENT_AMBULATORY_CARE_PROVIDER_SITE_OTHER): Payer: Medicare Other | Admitting: Orthopedic Surgery

## 2023-04-30 ENCOUNTER — Other Ambulatory Visit (INDEPENDENT_AMBULATORY_CARE_PROVIDER_SITE_OTHER): Payer: Medicare Other

## 2023-04-30 ENCOUNTER — Telehealth: Payer: Self-pay | Admitting: *Deleted

## 2023-04-30 DIAGNOSIS — Z96611 Presence of right artificial shoulder joint: Secondary | ICD-10-CM

## 2023-04-30 NOTE — Progress Notes (Unsigned)
Post-Op Visit Note   Patient: Faith Reeves           Date of Birth: 06/27/1951           MRN: [id number] Visit Date: 04/30/2023 PCP: Donita Brooks, MD   Assessment & Plan:  Chief Complaint:  Chief Complaint  Patient presents with   Right Shoulder - Routine Post Op    RIGHT RSA, BICEPS TENODESIS (surgery date 04-15-23)   Visit Diagnoses:  1. History of arthroplasty of right shoulder     Plan: Faith Reeves is a patient is now 2 weeks out reverse shoulder replacement and biceps tenodesis.  She states that she is having a lot of pain.  She wants to move her arm more normally.  Using the CPM brace sling daily.  Back on her baseline amount of oxycodone.  Reports some swelling in her arm and hand as well.  On examination deltoid fires.  External rotation is about 25 degrees.  Forward flexion and abduction passively are just to 90 degrees.  Incision intact.  Radiographs look good.  Plan at this time is physical therapy here 3 times a week for 4 weeks for range of motion and strengthening.  Overall the shoulder looks good in terms of motion and strength.  I think Faith Reeves is having more pain than usual because of her preop tolerance to pain medication.  Therapy here in 4-week return. Follow-Up Instructions: No follow-ups on file.   Orders:  Orders Placed This Encounter  Procedures   XR Shoulder Right   No orders of the defined types were placed in this encounter.   Imaging: No results found.  PMFS History: Patient Active Problem List   Diagnosis Date Noted   S/P reverse total shoulder arthroplasty, right 04/15/2023   Macular degeneration of right eye 12/18/2016   Allergy    Anxiety    Blood transfusion without reported diagnosis    Cataract    Hypertension    Past Medical History:  Diagnosis Date   Allergy    Anemia    Anxiety    Arthritis    Blood transfusion without reported diagnosis    Cataract    GERD (gastroesophageal reflux disease)    Hypertension    Hypertensive  retinopathy    OU   Macular degeneration     Family History  Problem Relation Age of Onset   Hypertension Mother    Cancer Sister    Stroke Maternal Grandmother     Past Surgical History:  Procedure Laterality Date   ABDOMINAL HYSTERECTOMY     ACHILLES TENDON REPAIR Bilateral    APPENDECTOMY     BACK SURGERY     BICEPT TENODESIS Right 04/15/2023   Procedure: RIGHT BICEPS TENODESIS;  Surgeon: Cammy Copa, MD;  Location: [facility] OR;  Service: Orthopedics;  Laterality: Right;   BILATERAL CARPAL TUNNEL RELEASE     CHOLECYSTECTOMY     COLONOSCOPY     EYE SURGERY Right 09/18/2022   cataract extraction   HERNIA REPAIR     JOINT REPLACEMENT     MELANOMA EXCISION Right 09/15/2017   Procedure: EXCISION LIPOSARCOMA RIGHT ARM;  Surgeon: Violeta Gelinas, MD;  Location[facility] OR;  Service: General;  Laterality: Right;   REVERSE SHOULDER ARTHROPLASTY Right 04/15/2023   Procedure: RIGHT REVERSE SHOULDER ARTHROPLASTY;  Surgeon: Cammy Copa, MD;  Location[facility] OR;  Service: Orthopedics;  Laterality: Right;   SPINE SURGERY     Social History   Occupational History   Not  on file  Tobacco Use   Smoking status: Never   Smokeless tobacco: Never  Vaping Use   Vaping Use: Never used  Substance and Sexual Activity   Alcohol use: Yes    Alcohol/week: 1.0 standard drink of alcohol    Types: 1 Glasses of wine per week   Drug use: No   Sexual activity: Never

## 2023-04-30 NOTE — Telephone Encounter (Signed)
Ortho bundle in office meeting completed. 

## 2023-05-01 ENCOUNTER — Ambulatory Visit (INDEPENDENT_AMBULATORY_CARE_PROVIDER_SITE_OTHER): Payer: Medicare Other | Admitting: Physical Therapy

## 2023-05-01 ENCOUNTER — Other Ambulatory Visit: Payer: Self-pay | Admitting: Family Medicine

## 2023-05-01 ENCOUNTER — Encounter: Payer: Self-pay | Admitting: Physical Therapy

## 2023-05-01 ENCOUNTER — Other Ambulatory Visit: Payer: Self-pay

## 2023-05-01 DIAGNOSIS — M25611 Stiffness of right shoulder, not elsewhere classified: Secondary | ICD-10-CM | POA: Diagnosis not present

## 2023-05-01 DIAGNOSIS — G8929 Other chronic pain: Secondary | ICD-10-CM

## 2023-05-01 DIAGNOSIS — M25511 Pain in right shoulder: Secondary | ICD-10-CM

## 2023-05-01 DIAGNOSIS — M6281 Muscle weakness (generalized): Secondary | ICD-10-CM | POA: Diagnosis not present

## 2023-05-01 MED ORDER — ZOLPIDEM TARTRATE 10 MG PO TABS: ORAL_TABLET | ORAL | 5 refills | Status: DC

## 2023-05-01 NOTE — Therapy (Signed)
OUTPATIENT PHYSICAL THERAPY EVALUATION   Patient Name: Faith Reeves MRN: [id number] DOB:09-Nov-1950, 72 y.o., female Today's Date: 05/01/2023  END OF SESSION:  PT End of Session - 05/01/23 1255     Visit Number 1    Number of Visits 16    Date for PT Re-Evaluation 06/26/23    Authorization Type Medicare, [place]    Progress Note Due on Visit 10    PT Start Time 1300    PT Stop Time 1332    PT Time Calculation (min) 32 min    Activity Tolerance Patient tolerated treatment well    Behavior During Therapy WFL for tasks assessed/performed             Past Medical History:  Diagnosis Date   Allergy    Anemia    Anxiety    Arthritis    Blood transfusion without reported diagnosis    Cataract    GERD (gastroesophageal reflux disease)    Hypertension    Hypertensive retinopathy    OU   Macular degeneration    Past Surgical History:  Procedure Laterality Date   ABDOMINAL HYSTERECTOMY     ACHILLES TENDON REPAIR Bilateral    APPENDECTOMY     BACK SURGERY     BICEPT TENODESIS Right 04/15/2023   Procedure: RIGHT BICEPS TENODESIS;  Surgeon: Cammy Copa, MD;  Location: [facility] OR;  Service: Orthopedics;  Laterality: Right;   BILATERAL CARPAL TUNNEL RELEASE     CHOLECYSTECTOMY     COLONOSCOPY     EYE SURGERY Right 09/18/2022   cataract extraction   HERNIA REPAIR     JOINT REPLACEMENT     MELANOMA EXCISION Right 09/15/2017   Procedure: EXCISION LIPOSARCOMA RIGHT ARM;  Surgeon: Violeta Gelinas, MD;  Location: [facility] OR;  Service: General;  Laterality: Right;   REVERSE SHOULDER ARTHROPLASTY Right 04/15/2023   Procedure: RIGHT REVERSE SHOULDER ARTHROPLASTY;  Surgeon: Cammy Copa, MD;  Location[facility] OR;  Service: Orthopedics;  Laterality: Right;   SPINE SURGERY     Patient Active Problem List   Diagnosis Date Noted   S/P reverse total shoulder arthroplasty, right 04/15/2023   Macular degeneration of right eye 12/18/2016   Allergy    Anxiety    Blood transfusion  without reported diagnosis    Cataract    Hypertension     PCP: Donita Brooks, MD  REFERRING PROVIDER: Cammy Copa, MD  REFERRING DIAG: [phone] (ICD-10-CM) - S/P reverse total shoulder arthroplasty, right  Rationale for Evaluation and Treatment: Rehabilitation  THERAPY DIAG:  Chronic right shoulder pain - Plan: PT plan of care cert/re-cert  Muscle weakness (generalized) - Plan: PT plan of care cert/re-cert  Stiffness of right shoulder, not elsewhere classified - Plan: PT plan of care cert/re-cert  ONSET DATE: DOS: 04/15/23   SUBJECTIVE:  SUBJECTIVE STATEMENT: Pt is s/p Rt RSA on 04/15/23.  She reports some pain and stiffness post-op.  She still needs some assistance with dressing and toileting.    PERTINENT HISTORY:  anx, HTN, spine surgery, achillies tendon repair  PAIN:  Are you having pain? Yes: NPRS scale: 2 currently, up to 10, at best 2/10 Pain location: Rt shoulder Pain description: aching, occasional "electric shock", stiffness Aggravating factors: any movement Relieving factors: icing  PRECAUTIONS:  Shoulder and Other: limit ER  WEIGHT BEARING RESTRICTIONS:  No  FALLS:  Has patient fallen in last 6 months? No  LIVING ENVIRONMENT: Lives with: lives with their spouse Lives in: House/apartment  OCCUPATION:  Retired: Product/process development scientist  PLOF:  Independent  PATIENT GOALS:  Improve pain, dress herself and toilet herself   OBJECTIVE:   PATIENT SURVEYS:  05/01/23: FOTO 45 (predicted 61)  COGNITIVE STATUS: Within functional limits for tasks assessed   POSTURE:  rounded shoulders and forward head  HAND DOMINANCE:  Right  GAIT: 05/01/23 Comments: independent   Shoulder  PALPATION: Mild tenderness along incision; swelling present  UPPER EXTREMITY  ROM:  ROM Right eval  Shoulder flexion P: 107  Shoulder extension   Shoulder abduction P:100  Shoulder adduction   Shoulder extension   Shoulder internal rotation   Shoulder external rotation P: 25   (Blank rows = not tested)   UPPER EXTREMITY MMT:  05/01/23: Deferred today due to post op status  MMT Right eval Left eval  Shoulder flexion    Shoulder extension    Shoulder abduction    Shoulder adduction    Shoulder extension    Shoulder internal rotation    Shoulder external rotation    Middle trapezius    Lower trapezius    Elbow flexion    Elbow extension    Wrist flexion    Wrist extension    Wrist ulnar deviation    Wrist radial deviation    Wrist pronation    Wrist supination    Grip strength     (Blank rows = not tested)   TREATMENT:                                                                                                                              DATE:  05/01/23  See HEP - performed trial reps of each exercise with min cues needed for technique   PATIENT EDUCATION:  Education details: HEP Person educated: Patient and Spouse Education method: Explanation, Demonstration, and Handouts Education comprehension: verbalized understanding, returned demonstration, and needs further education  HOME EXERCISE PROGRAM: Access Code: [email or web address] URL: [email or web address] Date: 05/01/2023 Prepared by: Moshe Cipro  Exercises - Circular Shoulder Pendulum with Table Support  - 3 x daily - 7 x weekly - 1 sets - 10 reps - Flexion-Extension Shoulder Pendulum with Table Support  - 3 x daily - 7 x weekly - 1 sets - 10 reps - Horizontal Shoulder Pendulum with Table Support  -  3 x daily - 7 x weekly - 1 sets - 10 reps - Standing Scapular Retraction  - 3 x daily - 7 x weekly - 1 sets - 10 reps - 5 sec hold - Seated Shoulder Flexion Towel Slide at Table Top  - 3 x daily - 7 x weekly - 1 sets - 10 reps - Seated Shoulder Scaption Slide at Table Top  with Forearm in Neutral  - 3 x daily - 7 x weekly - 1 sets - 10 reps   ASSESSMENT:  CLINICAL IMPRESSION: Patient is a 72 y.o. female who was seen today for physical therapy evaluation and treatment for Rt RSA on 04/15/23. She demonstrates decreased ROM and strength with expected post op pain and swelling affecting function.  She will benefit from PT to address deficits listed.    OBJECTIVE IMPAIRMENTS: decreased ROM, decreased strength, hypomobility, increased edema, increased fascial restrictions, impaired UE functional use, postural dysfunction, and pain.   ACTIVITY LIMITATIONS: carrying, lifting, sleeping, toileting, dressing, hygiene/grooming, and locomotion level  PARTICIPATION LIMITATIONS: meal prep, cleaning, laundry, driving, shopping, and community activity  PERSONAL FACTORS: 3+ comorbidities: anx, HTN, spine surgery, achillies tendon repair  are also affecting patient's functional outcome.   REHAB POTENTIAL: Good  CLINICAL DECISION MAKING: Evolving/moderate complexity  EVALUATION COMPLEXITY: Moderate   GOALS: Goals reviewed with patient? Yes  SHORT TERM GOALS: Target date: 05/29/2023   Independent with initial HEP Goal status: INITIAL  2.  Rt shoulder PROM improved 15 deg all directions for improved function Goal status: INITIAL   LONG TERM GOALS: Target date: 06/26/2023  Independent with final HEP Goal status: INITIAL  2.  FOTO score improved to 61 Goal status: INITIAL  3.  Rt shoulder flexion and abduction AROM at least 120 deg for improved ADLs and independence Goal status: INIITAL  4.  Report pain < 3/10 with reaching activities for improved function Goal status: INITIAL  5.  Demonstrate at least 4/5 Rt shoulder flexion and abduction strength for improved function Goal status: INITIAL   PLAN:  PT FREQUENCY: 2x/week  PT DURATION: 8 weeks  PLANNED INTERVENTIONS: Therapeutic exercises, Therapeutic activity, Neuromuscular re-education, Patient/Family  education, Self Care, Joint mobilization, Aquatic Therapy, Dry Needling, Electrical stimulation, Cryotherapy, Moist heat, Taping, Vasopneumatic device, Ultrasound, Ionotophoresis 4mg /ml Dexamethasone, Manual therapy, and Re-evaluation.  PLAN FOR NEXT SESSION: review HEP, continue with PROM and add deltoid isometrics, limit ER to no > 30 deg for now.     NEXT MD VISIT: 05/28/23  Clarita Crane, PT, DPT 05/01/23 1:44 PM

## 2023-05-04 DIAGNOSIS — M19011 Primary osteoarthritis, right shoulder: Secondary | ICD-10-CM

## 2023-05-04 DIAGNOSIS — M7521 Bicipital tendinitis, right shoulder: Secondary | ICD-10-CM

## 2023-05-13 ENCOUNTER — Ambulatory Visit (INDEPENDENT_AMBULATORY_CARE_PROVIDER_SITE_OTHER): Payer: Medicare Other | Admitting: Physical Therapy

## 2023-05-13 ENCOUNTER — Encounter: Payer: Self-pay | Admitting: Physical Therapy

## 2023-05-13 DIAGNOSIS — M6281 Muscle weakness (generalized): Secondary | ICD-10-CM | POA: Diagnosis not present

## 2023-05-13 DIAGNOSIS — G8929 Other chronic pain: Secondary | ICD-10-CM | POA: Diagnosis not present

## 2023-05-13 DIAGNOSIS — M25611 Stiffness of right shoulder, not elsewhere classified: Secondary | ICD-10-CM

## 2023-05-13 DIAGNOSIS — M25511 Pain in right shoulder: Secondary | ICD-10-CM

## 2023-05-13 NOTE — Therapy (Signed)
OUTPATIENT PHYSICAL THERAPY TREATMENT   Patient Name: Faith Reeves MRN: [id number] DOB:[birth year], 72 y.o., female Today's Date: 05/13/2023  END OF SESSION:  PT End of Session - 05/13/23 1015     Visit Number 2    Number of Visits 16    Date for PT Re-Evaluation 06/26/23    Authorization Type Medicare, [place]    Progress Note Due on Visit 10    PT Start Time 1015    PT Stop Time 1102    PT Time Calculation (min) 47 min    Activity Tolerance Patient tolerated treatment well;Patient limited by pain    Behavior During Therapy WFL for tasks assessed/performed              Past Medical History:  Diagnosis Date   Allergy    Anemia    Anxiety    Arthritis    Blood transfusion without reported diagnosis    Cataract    GERD (gastroesophageal reflux disease)    Hypertension    Hypertensive retinopathy    OU   Macular degeneration    Past Surgical History:  Procedure Laterality Date   ABDOMINAL HYSTERECTOMY     ACHILLES TENDON REPAIR Bilateral    APPENDECTOMY     BACK SURGERY     BICEPT TENODESIS Right 04/15/2023   Procedure: RIGHT BICEPS TENODESIS;  Surgeon: Cammy Copa, MD;  Location: [facility] OR;  Service: Orthopedics;  Laterality: Right;   BILATERAL CARPAL TUNNEL RELEASE     CHOLECYSTECTOMY     COLONOSCOPY     EYE SURGERY Right 09/18/2022   cataract extraction   HERNIA REPAIR     JOINT REPLACEMENT     MELANOMA EXCISION Right 09/15/2017   Procedure: EXCISION LIPOSARCOMA RIGHT ARM;  Surgeon: Violeta Gelinas, MD;  Location: [facility] OR;  Service: General;  Laterality: Right;   REVERSE SHOULDER ARTHROPLASTY Right 04/15/2023   Procedure: RIGHT REVERSE SHOULDER ARTHROPLASTY;  Surgeon: Cammy Copa, MD;  Location: [facility] OR;  Service: Orthopedics;  Laterality: Right;   SPINE SURGERY     Patient Active Problem List   Diagnosis Date Noted   Arthritis of right shoulder region 05/04/2023   Biceps tendonitis on right 05/04/2023   S/P reverse total shoulder arthroplasty,  right 04/15/2023   Macular degeneration of right eye 12/18/2016   Allergy    Anxiety    Blood transfusion without reported diagnosis    Cataract    Hypertension     PCP: Donita Brooks, MD  REFERRING PROVIDER: Cammy Copa, MD  REFERRING DIAG: [phone] (ICD-10-CM) - S/P reverse total shoulder arthroplasty, right  Rationale for Evaluation and Treatment: Rehabilitation  THERAPY DIAG:  Chronic right shoulder pain  Muscle weakness (generalized)  Stiffness of right shoulder, not elsewhere classified  ONSET DATE: DOS: 04/15/23   SUBJECTIVE:  SUBJECTIVE STATEMENT: She has been doing her exercises but painful.  She has been able to wash her hair.  The machine makes her hand swell.    PERTINENT HISTORY:  anx, HTN, spine surgery, achillies tendon repair  PAIN:  Are you having pain?  Yes: NPRS scale: 2 currently, since last PT up to 8/10, at best 0/10 Pain location: Rt shoulder Pain description: aching, occasional "electric shock", stiffness Aggravating factors: any movement Relieving factors: icing  PRECAUTIONS:  Shoulder and Other: limit ER  WEIGHT BEARING RESTRICTIONS:  No  FALLS:  Has patient fallen in last 6 months? No  LIVING ENVIRONMENT: Lives with: lives with their spouse Lives in: House/apartment  OCCUPATION:  Retired: Product/process development scientist  PLOF:  Independent  PATIENT GOALS:  Improve pain, dress herself and toilet herself   OBJECTIVE:   PATIENT SURVEYS:  05/01/23: FOTO 45 (predicted 61)  COGNITIVE STATUS: Within functional limits for tasks assessed   POSTURE:  rounded shoulders and forward head  HAND DOMINANCE:  Right  GAIT: 05/01/23 Comments: independent   Shoulder  PALPATION: Mild tenderness along incision; swelling present  UPPER EXTREMITY  ROM:  ROM Right eval  Shoulder flexion P: 107  Shoulder extension   Shoulder abduction P:100  Shoulder adduction   Shoulder extension   Shoulder internal rotation   Shoulder external rotation P: 25   (Blank rows = not tested)   UPPER EXTREMITY MMT:  05/01/23: Deferred today due to post op status  MMT Right eval Left eval  Shoulder flexion    Shoulder extension    Shoulder abduction    Shoulder adduction    Shoulder extension    Shoulder internal rotation    Shoulder external rotation    Middle trapezius    Lower trapezius    Elbow flexion    Elbow extension    Wrist flexion    Wrist extension    Wrist ulnar deviation    Wrist radial deviation    Wrist pronation    Wrist supination    Grip strength     (Blank rows = not tested)   TREATMENT:                                                                                                                              DATE:  05/13/2023: Therapeutic Exercise: PT demo & verbal cues on 30* ER precaution & rationale. Washing her hair probably requires >30* ER. Pt verbalized understanding.  Pt & husband demo current use of pneumatic machine for PROM that is causing increase in shoulder pain & swelling in her hand. Due to pt's ht, she has lateral trunk lean to seat her RUE into machine and her hand dips lower than elbow also.  PT placed 4" step under machine which enabled a more upright posture and ability to keep her RUE seated in the machine.  Patient also demonstrated how to keep the machine level so that her forearm is horizontal and decreases  dependent edema.  Patient and husband verbalized understanding. PT educated patient on upper body posture decreasing head forward and rounded shoulders to improve shoulder mobility.  PT recommended continuing shoulder retraction exercises frequently during the day to discourage the rounded shoulder posture.  Patient verbalized understanding PT reviewed HEP using MedBridge and verbal cues.   PT recommended starting and stopping pendulum exercises with a small range of motion and progressively increasing then decreasing to avoid muscle spasms.  PT also recommended standing upright with scapular retraction between each motion.  PT demo slides along table with recommendation to consider putting right arm and side of pillowcase to decrease resistance with table.  Patient reported this was easier with less shoulder discomfort.  Patient and husband verbalized better understanding of HEP.  Manual therapy: Supine PROM to right shoulder for flexion, abduction and external rotation up to 30 degrees.   05/01/23  See HEP - performed trial reps of each exercise with min cues needed for technique   PATIENT EDUCATION:  Education details: HEP Person educated: Patient and Spouse Education method: Explanation, Demonstration, and Handouts Education comprehension: verbalized understanding, returned demonstration, and needs further education  HOME EXERCISE PROGRAM: Access Code: [email or web address] URL: [email or web address] Date: 05/01/2023 Prepared by: Moshe Cipro  Exercises - Circular Shoulder Pendulum with Table Support  - 3 x daily - 7 x weekly - 1 sets - 10 reps - Flexion-Extension Shoulder Pendulum with Table Support  - 3 x daily - 7 x weekly - 1 sets - 10 reps - Horizontal Shoulder Pendulum with Table Support  - 3 x daily - 7 x weekly - 1 sets - 10 reps - Standing Scapular Retraction  - 3 x daily - 7 x weekly - 1 sets - 10 reps - 5 sec hold - Seated Shoulder Flexion Towel Slide at Table Top  - 3 x daily - 7 x weekly - 1 sets - 10 reps - Seated Shoulder Scaption Slide at Table Top with Forearm in Neutral  - 3 x daily - 7 x weekly - 1 sets - 10 reps   ASSESSMENT:  CLINICAL IMPRESSION: Patient and husband appear to have a better understanding of use of passive range of motion machine and HEP.  Patient continues to benefit from skilled PT following reverse shoulder arthroplasty  to her dominant upper extremity.   OBJECTIVE IMPAIRMENTS: decreased ROM, decreased strength, hypomobility, increased edema, increased fascial restrictions, impaired UE functional use, postural dysfunction, and pain.   ACTIVITY LIMITATIONS: carrying, lifting, sleeping, toileting, dressing, hygiene/grooming, and locomotion level  PARTICIPATION LIMITATIONS: meal prep, cleaning, laundry, driving, shopping, and community activity  PERSONAL FACTORS: 3+ comorbidities: anx, HTN, spine surgery, achillies tendon repair  are also affecting patient's functional outcome.   REHAB POTENTIAL: Good  CLINICAL DECISION MAKING: Evolving/moderate complexity  EVALUATION COMPLEXITY: Moderate   GOALS: Goals reviewed with patient? Yes  SHORT TERM GOALS: Target date: 05/29/2023   Independent with initial HEP Goal status: Ongoing 05/13/2023  2.  Rt shoulder PROM improved 15 deg all directions for improved function Goal status:  Ongoing 05/13/2023   LONG TERM GOALS: Target date: 06/26/2023  Independent with final HEP Goal status: Ongoing 05/13/2023  2.  FOTO score improved to 61 Goal status: Ongoing 05/13/2023  3.  Rt shoulder flexion and abduction AROM at least 120 deg for improved ADLs and independence Goal status: Ongoing 05/13/2023  4.  Report pain < 3/10 with reaching activities for improved function Goal status: Ongoing 05/13/2023  5.  Demonstrate at least 4/5 Rt shoulder flexion  and abduction strength for improved function Goal status: Ongoing 05/13/2023   PLAN:  PT FREQUENCY: 2x/week  PT DURATION: 8 weeks  PLANNED INTERVENTIONS: Therapeutic exercises, Therapeutic activity, Neuromuscular re-education, Patient/Family education, Self Care, Joint mobilization, Aquatic Therapy, Dry Needling, Electrical stimulation, Cryotherapy, Moist heat, Taping, Vasopneumatic device, Ultrasound, Ionotophoresis 4mg /ml Dexamethasone, Manual therapy, and Re-evaluation.  PLAN FOR NEXT SESSION: continue with PROM and add  deltoid isometrics, limit ER to no > 30 deg for now.     NEXT MD VISIT: 05/28/23   Vladimir Faster, PT, DPT 05/13/2023, 1:16 PM

## 2023-05-15 ENCOUNTER — Encounter: Payer: Medicare Other | Admitting: Physical Therapy

## 2023-05-16 ENCOUNTER — Other Ambulatory Visit: Payer: Self-pay | Admitting: Family Medicine

## 2023-05-16 ENCOUNTER — Encounter: Payer: Self-pay | Admitting: Family Medicine

## 2023-05-16 ENCOUNTER — Ambulatory Visit (INDEPENDENT_AMBULATORY_CARE_PROVIDER_SITE_OTHER): Payer: Medicare Other | Admitting: Family Medicine

## 2023-05-16 VITALS — BP 132/76 | HR 78 | Temp 99.0°F | Ht 59.0 in | Wt 227.0 lb

## 2023-05-16 DIAGNOSIS — I10 Essential (primary) hypertension: Secondary | ICD-10-CM | POA: Diagnosis not present

## 2023-05-16 DIAGNOSIS — G8929 Other chronic pain: Secondary | ICD-10-CM | POA: Diagnosis not present

## 2023-05-16 DIAGNOSIS — M4120 Other idiopathic scoliosis, site unspecified: Secondary | ICD-10-CM | POA: Diagnosis not present

## 2023-05-16 DIAGNOSIS — M545 Low back pain, unspecified: Secondary | ICD-10-CM

## 2023-05-16 MED ORDER — WEGOVY 0.5 MG/0.5ML ~~LOC~~ SOAJ: 0.5000 mg | SUBCUTANEOUS | 1 refills | Status: DC

## 2023-05-16 MED ORDER — TRAZODONE HCL 50 MG PO TABS: 50.0000 mg | ORAL_TABLET | Freq: Every day | ORAL | 1 refills | Status: DC

## 2023-05-16 MED ORDER — WEGOVY 0.5 MG/0.5ML ~~LOC~~ SOAJ: 0.5000 mg | SUBCUTANEOUS | 3 refills | Status: AC

## 2023-05-16 NOTE — Progress Notes (Signed)
Subjective:    Patient ID: Faith Reeves, female    DOB: [birth year], 72 y.o.   MRN: [id number]  Patient recently had surgery on her right rotator cuff.  She is 1 month out from surgery.  I reviewed her CBC and CMP after surgery in June which were normal except for stage IIIa chronic kidney disease.  I discussed this with the patient.  She is taking Celebrex every other day.  I would prefer she not take this medication however she insist on continuing to take it every other day due to the severity of the pain in her back.  She is requesting something to help her sleep because her insurance will no longer pay for Ambien.  We had a long discussion and afterwards she would like to try trazodone.  She also would like assistance with weight loss.  Patient is 4 foot 11 and weighs 227 pounds.  This is likely contributing to her back pain. Past Medical History:  Diagnosis Date   Allergy    Anemia    Anxiety    Arthritis    Blood transfusion without reported diagnosis    Cataract    GERD (gastroesophageal reflux disease)    Hypertension    Hypertensive retinopathy    OU   Macular degeneration    Past Surgical History:  Procedure Laterality Date   ABDOMINAL HYSTERECTOMY     ACHILLES TENDON REPAIR Bilateral    APPENDECTOMY     BACK SURGERY     BICEPT TENODESIS Right 04/15/2023   Procedure: RIGHT BICEPS TENODESIS;  Surgeon: Cammy Copa, MD;  Location: [facility] OR;  Service: Orthopedics;  Laterality: Right;   BILATERAL CARPAL TUNNEL RELEASE     CHOLECYSTECTOMY     COLONOSCOPY     EYE SURGERY Right 09/18/2022   cataract extraction   HERNIA REPAIR     JOINT REPLACEMENT     MELANOMA EXCISION Right 09/15/2017   Procedure: EXCISION LIPOSARCOMA RIGHT ARM;  Surgeon: Violeta Gelinas, MD;  Location: [facility] OR;  Service: General;  Laterality: Right;   REVERSE SHOULDER ARTHROPLASTY Right 04/15/2023   Procedure: RIGHT REVERSE SHOULDER ARTHROPLASTY;  Surgeon: Cammy Copa, MD;  Location: [facility] OR;   Service: Orthopedics;  Laterality: Right;   SPINE SURGERY     Current Outpatient Medications on File Prior to Visit  Medication Sig Dispense Refill   albuterol (PROVENTIL HFA;VENTOLIN HFA) 108 (90 Base) MCG/ACT inhaler Inhale 2 puffs into the lungs every 4 (four) hours as needed for wheezing or shortness of breath. 1 Inhaler 0   ALPRAZolam (XANAX) 0.5 MG tablet Take 1 tablet (0.5 mg total) by mouth 3 (three) times daily as needed for anxiety. 30 tablet 0   celecoxib (CELEBREX) 200 MG capsule TAKE 1 CAPSULE BY MOUTH EVERY DAY (Patient taking differently: Take 200 mg by mouth every other day.) 90 capsule 3   docusate sodium (COLACE) 100 MG capsule Take 1 capsule (100 mg total) by mouth 2 (two) times daily. 10 capsule 0   gabapentin (NEURONTIN) 300 MG capsule Take 1 capsule by mouth three times a day. 90 capsule 5   ipratropium (ATROVENT) 0.06 % nasal spray SPRAY 2 SPRAYS INTO EACH NOSTRIL 4 TIMES A DAY. 15 mL 1   levocetirizine (XYZAL) 5 MG tablet TAKE 1 TABLET BY MOUTH EVERY DAY IN THE EVENING 90 tablet 1   mometasone (ELOCON) 0.1 % cream APPLY TO AFFECTED AREA TOPICALLY EVERY DAY 45 g 2   Multiple Vitamins-Minerals (EQ VISION FORMULA 50+ PO) Take  1 capsule by mouth daily.     oxyCODONE (OXY IR/ROXICODONE) 5 MG immediate release tablet Take 1 tablet (5 mg total) by mouth every 4 (four) hours as needed for moderate pain (pain score 4-6). 30 tablet 0   pantoprazole (PROTONIX) 40 MG tablet Take 1 tablet (40 mg total) by mouth daily. 90 tablet 0   sertraline (ZOLOFT) 50 MG tablet TAKE 1 AND 1/2 TABLETS DAILY BY MOUTH 135 tablet 1   telmisartan-hydrochlorothiazide (MICARDIS HCT) 80-12.5 MG tablet Take 1 tablet by mouth daily. 90 tablet 1   valACYclovir (VALTREX) 1000 MG tablet Take 2 tablets (2,000 mg total) by mouth 2 (two) times daily. 4 tablet 2   zolpidem (AMBIEN) 10 MG tablet TAKE 1 TABLET BY MOUTH EVERY DAY AT BEDTIME AS NEEDED FOR SLEEP 30 tablet 5   No current facility-administered medications  on file prior to visit.   Allergies  Allergen Reactions   Cymbalta [Duloxetine Hcl] Swelling   Social History   Socioeconomic History   Marital status: Married    Spouse name: Not on file   Number of children: Not on file   Years of education: Not on file   Highest education level: Not on file  Occupational History   Not on file  Tobacco Use   Smoking status: Never   Smokeless tobacco: Never  Vaping Use   Vaping status: Never Used  Substance and Sexual Activity   Alcohol use: Yes    Alcohol/week: 1.0 standard drink of alcohol    Types: 1 Glasses of wine per week   Drug use: No   Sexual activity: Never  Other Topics Concern   Not on file  Social History Narrative   Recent death of son; lives with husband; originally from [place]; most of family still lives in the [place]    Social Determinants of Health   Financial Resource Strain: Low Risk  (10/10/2022)   Overall Financial Resource Strain (CARDIA)    Difficulty of Paying Living Expenses: Not hard at all  Food Insecurity: No Food Insecurity (10/10/2022)   Hunger Vital Sign    Worried About Running Out of Food in the Last Year: Never true    Ran Out of Food in the Last Year: Never true  Transportation Needs: No Transportation Needs (10/10/2022)   PRAPARE - Administrator, Civil Service (Medical): No    Lack of Transportation (Non-Medical): No  Physical Activity: Inactive (10/10/2022)   Exercise Vital Sign    Days of Exercise per Week: 0 days    Minutes of Exercise per Session: 0 min  Stress: No Stress Concern Present (10/10/2022)   [employer] of Occupational Health - Occupational Stress Questionnaire    Feeling of Stress : Not at all  Social Connections: Moderately Isolated (10/10/2022)   Social Connection and Isolation Panel [NHANES]    Frequency of Communication with Friends and Family: More than three times a week    Frequency of Social Gatherings with Friends and Family: Twice a week    Attends  Religious Services: Never    Database administrator or Organizations: No    Attends Banker Meetings: Never    Marital Status: Married  Catering manager Violence: Not At Risk (10/10/2022)   Humiliation, Afraid, Rape, and Kick questionnaire    Fear of Current or Ex-Partner: No    Emotionally Abused: No    Physically Abused: No    Sexually Abused: No     Review of Systems  All other systems reviewed and are negative.      Objective:   Physical Exam Constitutional:      Appearance: She is obese.  Cardiovascular:     Rate and Rhythm: Normal rate and regular rhythm.     Heart sounds: Normal heart sounds.  Pulmonary:     Effort: Pulmonary effort is normal.     Breath sounds: Normal breath sounds.  Musculoskeletal:     Right shoulder: Tenderness and crepitus present. Decreased range of motion. Decreased strength.     Thoracic back: Scoliosis present.     Lumbar back: Scoliosis present.  Neurological:     Mental Status: She is alert.           Assessment & Plan:  Primary hypertension  Other idiopathic scoliosis, unspecified spinal region  Chronic midline low back pain without sciatica  Morbid obesity (HCC) Patient deals with severe obesity and this is contribute to several medical problems she had clearly hypertension and also chronic back pain generously gnosis of degenerative disc disease.  She would benefit greatly from weight loss.  Therefore I recommended that she try Wegovy 0.5 mg subcu weekly and uptitrate monthly as tolerated.  We will try trazodone 50 mg p.o. nightly as needed insomnia to replace Ambien.  I recommended trying to reduce her consumption of Celebrex due to her chronic kidney disease.  Blood pressure today is excellent.  I reviewed her labs from June which were within normal limits.  I do not see a reason to repeat that today

## 2023-05-16 NOTE — Progress Notes (Signed)
[facility] - Clinic Note  05/21/2023   CHIEF COMPLAINT Patient presents for Retina Follow Up  HISTORY OF PRESENT ILLNESS: Faith Reeves is a 72 y.o. female who presents to the clinic today for:   HPI     Retina Follow Up   Patient presents with  Wet AMD.  In right eye.  This started months ago.  Duration of 6 weeks.  I, the attending physician,  performed the HPI with the patient and updated documentation appropriately.        Comments   Patient feels that the vision is a little blurry. She is not using eye drops at this time.       Last edited by Rennis Chris, MD on 05/21/2023  5:03 PM.       Referring physician: Donita Brooks, MD [address] [facility] [address] [place],  [place] [address]  HISTORICAL INFORMATION:   Selected notes from the MEDICAL RECORD NUMBER Referred by Dr. Baker Pierini for concern of exu ARMD OU   CURRENT MEDICATIONS: No current outpatient medications on file. (Ophthalmic Drugs)   No current facility-administered medications for this visit. (Ophthalmic Drugs)   Current Outpatient Medications (Other)  Medication Sig   albuterol (PROVENTIL HFA;VENTOLIN HFA) 108 (90 Base) MCG/ACT inhaler Inhale 2 puffs into the lungs every 4 (four) hours as needed for wheezing or shortness of breath.   ALPRAZolam (XANAX) 0.5 MG tablet Take 1 tablet (0.5 mg total) by mouth 3 (three) times daily as needed for anxiety.   celecoxib (CELEBREX) 200 MG capsule TAKE 1 CAPSULE BY MOUTH EVERY DAY (Patient taking differently: Take 200 mg by mouth every other day.)   docusate sodium (COLACE) 100 MG capsule Take 1 capsule (100 mg total) by mouth 2 (two) times daily.   gabapentin (NEURONTIN) 300 MG capsule Take 1 capsule by mouth three times a day.   ipratropium (ATROVENT) 0.06 % nasal spray SPRAY 2 SPRAYS INTO EACH NOSTRIL 4 TIMES A DAY.   levocetirizine (XYZAL) 5 MG tablet TAKE 1 TABLET BY MOUTH EVERY DAY IN THE EVENING   mometasone (ELOCON) 0.1 % cream APPLY TO  AFFECTED AREA TOPICALLY EVERY DAY   Multiple Vitamins-Minerals (EQ VISION FORMULA 50+ PO) Take 1 capsule by mouth daily.   oxyCODONE (OXY IR/ROXICODONE) 5 MG immediate release tablet Take 1 tablet (5 mg total) by mouth every 4 (four) hours as needed for moderate pain (pain score 4-6).   pantoprazole (PROTONIX) 40 MG tablet Take 1 tablet (40 mg total) by mouth daily.   Semaglutide-Weight Management (WEGOVY) 0.5 MG/0.5ML SOAJ Inject 0.5 mg into the skin once a week.   sertraline (ZOLOFT) 50 MG tablet TAKE 1 AND 1/2 TABLETS DAILY BY MOUTH   telmisartan-hydrochlorothiazide (MICARDIS HCT) 80-12.5 MG tablet Take 1 tablet by mouth daily.   traZODone (DESYREL) 50 MG tablet Take 1 tablet (50 mg total) by mouth at bedtime.   valACYclovir (VALTREX) 1000 MG tablet Take 2 tablets (2,000 mg total) by mouth 2 (two) times daily.   zolpidem (AMBIEN) 10 MG tablet TAKE 1 TABLET BY MOUTH EVERY DAY AT BEDTIME AS NEEDED FOR SLEEP   No current facility-administered medications for this visit. (Other)   REVIEW OF SYSTEMS: ROS   Positive for: Gastrointestinal, Neurological, Eyes Negative for: Constitutional, Skin, Genitourinary, Musculoskeletal, HENT, Endocrine, Cardiovascular, Respiratory, Psychiatric, Allergic/Imm, Heme/Lymph Last edited by Charlette Caffey, COT on 05/21/2023  1:59 PM.        ALLERGIES Allergies  Allergen Reactions   Cymbalta [Duloxetine  Hcl] Swelling   PAST MEDICAL HISTORY Past Medical History:  Diagnosis Date   Allergy    Anemia    Anxiety    Arthritis    Blood transfusion without reported diagnosis    Cataract    GERD (gastroesophageal reflux disease)    Hypertension    Hypertensive retinopathy    OU   Macular degeneration    Past Surgical History:  Procedure Laterality Date   ABDOMINAL HYSTERECTOMY     ACHILLES TENDON REPAIR Bilateral    APPENDECTOMY     BACK SURGERY     BICEPT TENODESIS Right 04/15/2023   Procedure: RIGHT BICEPS TENODESIS;  Surgeon: Cammy Copa, MD;  Location: [facility] OR;  Service: Orthopedics;  Laterality: Right;   BILATERAL CARPAL TUNNEL RELEASE     CHOLECYSTECTOMY     COLONOSCOPY     EYE SURGERY Right 09/18/2022   cataract extraction   HERNIA REPAIR     JOINT REPLACEMENT     MELANOMA EXCISION Right 09/15/2017   Procedure: EXCISION LIPOSARCOMA RIGHT ARM;  Surgeon: Violeta Gelinas, MD;  Location: [facility];  Service: General;  Laterality: Right;   REVERSE SHOULDER ARTHROPLASTY Right 04/15/2023   Procedure: RIGHT REVERSE SHOULDER ARTHROPLASTY;  Surgeon: Cammy Copa, MD;  Location: [facility] OR;  Service: Orthopedics;  Laterality: Right;   SPINE SURGERY     FAMILY HISTORY Family History  Problem Relation Age of Onset   Hypertension Mother    Cancer Sister    Stroke Maternal Grandmother    SOCIAL HISTORY Social History   Tobacco Use   Smoking status: Never   Smokeless tobacco: Never  Vaping Use   Vaping status: Never Used  Substance Use Topics   Alcohol use: Yes    Alcohol/week: 1.0 standard drink of alcohol    Types: 1 Glasses of wine per week   Drug use: No       OPHTHALMIC EXAM: Base Eye Exam     Visual Acuity (Snellen - Linear)       Right Left   Dist cc 20/30 20/20   Dist ph cc NI          Tonometry (Tonopen, 2:02 PM)       Right Left   Pressure 16 16         Pupils       Dark Light Shape React APD   Right 3 2 Round Brisk None   Left 3 2 Round Brisk None         Visual Fields       Left Right    Full Full         Extraocular Movement       Right Left    Full, Ortho Full, Ortho         Neuro/Psych     Oriented x3: Yes   Mood/Affect: Normal         Dilation     Both eyes: 1.0% Mydriacyl, 2.5% Phenylephrine @ 2:00 PM           Slit Lamp and Fundus Exam     Slit Lamp Exam       Right Left   Lids/Lashes Dermatochalasis - upper lid, mild Meibomian gland dysfunction Dermatochalasis - upper lid, mild Meibomian gland dysfunction   Conjunctiva/Sclera White and  quiet White and quiet   Cornea 2-3+ fine Punctate epithelial erosions 2+ fine Punctate epithelial erosions inferiorly   Anterior Chamber deep, clear, narrow temporal angle deep, clear, narrow temporal angle  Iris Round and dilated Round and dilated   Lens PCIOL in good position 2-3+ Nuclear sclerosis, 2-3+ Cortical cataract   Anterior Vitreous Vitreous syneresis, Posterior vitreous detachment, prominent vitreous condensation now nasal to disc Vitreous syneresis, Posterior vitreous detachment         Fundus Exam       Right Left   Disc Pink and Sharp, Compact, +PPA Pink and Sharp, temporal PPP, Compact   C/D Ratio 0.2 0.1   Macula Blunted foveal reflex, drusen, RPE mottling, clumping and atrophy, +CNV, Stable resolution of central SRF, No heme Flat, good foveal reflex, scattered Drusen, RPE mottling and clumping, focal PEDs, No heme or edema   Vessels mild attenuation, mild tortuosity attenuated, Tortuous   Periphery Attached, mild reticular degeneration, no heme Attached, no heme           Refraction     Wearing Rx       Sphere Cylinder Axis Add   Right +2.00 +0.50 008 +2.50   Left +2.50 +1.00 154 +2.50    Type: PAL           IMAGING AND PROCEDURES  Imaging and Procedures for @TODAY @  OCT, Retina - OU - Both Eyes       Right Eye Quality was good. Central Foveal Thickness: 207. Progression has been stable. Findings include normal foveal contour, no IRF, no SRF, retinal drusen , pigment epithelial detachment, outer retinal atrophy (Stable improvement in central SRF overlying low, stable PEDs).   Left Eye Quality was good. Central Foveal Thickness: 270. Progression has been stable. Findings include normal foveal contour, no IRF, no SRF, retinal drusen .   Notes *Images captured and stored on drive  Diagnosis / Impression:  OD: stable improvement in central SRF overlying low, stable PEDs OS: non-exu ARMD -- stable  Clinical management:  See  below  Abbreviations: NFP - Normal foveal profile. CME - cystoid macular edema. PED - pigment epithelial detachment. IRF - intraretinal fluid. SRF - subretinal fluid. EZ - ellipsoid zone. ERM - epiretinal membrane. ORA - outer retinal atrophy. ORT - outer retinal tubulation. SRHM - subretinal hyper-reflective material     Intravitreal Injection, Pharmacologic Agent - OD - Right Eye       Time Out 05/21/2023. 2:58 PM. Confirmed correct patient, procedure, site, and patient consented.   Anesthesia Topical anesthesia was used. Anesthetic medications included Lidocaine 2%, Proparacaine 0.5%.   Procedure Preparation included 5% betadine to ocular surface, eyelid speculum. A (32g) needle was used.   Injection: 6 mg faricimab-svoa 6 MG/0.05ML   Route: Intravitreal, Site: Right Eye   NDC: O8010301, Lot: [id number], Expiration date: 05/03/2025, Waste: 0 mL   Post-op Post injection exam found visual acuity of at least counting fingers. The patient tolerated the procedure well. There were no complications. The patient received written and verbal post procedure care education. Post injection medications were not given.            ASSESSMENT/PLAN:    ICD-10-CM   1. Exudative age-related macular degeneration of right eye with active choroidal neovascularization (HCC)  H35.3211 OCT, Retina - OU - Both Eyes    Intravitreal Injection, Pharmacologic Agent - OD - Right Eye    faricimab-svoa (VABYSMO) 6mg /0.1mL intravitreal injection    CANCELED: Intravitreal Injection, Pharmacologic Agent - OD - Right Eye    2. Intermediate stage nonexudative age-related macular degeneration of left eye  H35.3122     3. Essential hypertension  I10     4. Hypertensive retinopathy  of both eyes  H35.033     5. Combined forms of age-related cataract of left eye  H25.812     6. Pseudophakia  Z96.1     7. Glaucoma suspect of both eyes  H40.003     8. Dry eyes  H04.123      1. Exudative age related  macular degeneration, right eye  - FA (03.10.20) shows +CNV w/ staining/leakage  - repeat FA 09.13.21 shows interval increase in perifoveal leakage OD - s/p IVA OD #1 (03.10.20), #2 (04.13.20), #3 (05.11.20), #4 (06.23.20), #5 (8.18.20), #6 (10.27.20), #7 (01.26.21), #8 (04.26.21), #9 (08.02.21), #10 (09.13.21), #11 (10.11.21), #12 (11.9.21), #13 (12.14.21), #14 (1.24.22), #15 (3.15.22), #16 (05.24.22), #17 (8.9.22), #18 (10.18.22), #19 (01.03.23), #20 (03.29.23) -- IVA resistance ========================================================= - s/p IVE OD #1 (06.07.23), #2 (07.19.23), #3 (09.01.23), #4 (10.13.23), #5 (11.10.23), #6 (12.22.23) -- IVE resistance =========================================================  - s/p IVV OD #1 (02.02.24), #2 (03.15.24), #3 (05.01.24), #4 (06.05.24) - pt was doing very well with q3 mo maintenance injections, but then developed interval increases in central SRF over several visits -- IVA and IVE resistance  - BCVA OD 20/30 -- stable - OCT today stable improvement in central SRF overlying low, stable PEDs at 6 weeks   - recommend IVV OD #5 today, 07.17.24 w/ f/u ext to 8 wks  - pt wishes to be treated with IVV OD  - RBA of procedure discussed, questions answered  - IVE informed consent obtained and signed, 06.07.23 (OD)  - IVV informed consent obtained and signed, 02.02.24 (OD)  - see procedure note  - f/u in 8 wks -- DFE/OCT/possible injection  2. Age related macular degeneration, non-exudative, left eye - The incidence, anatomy, and pathology of dry AMD, risk of progression, and the AREDS and AREDS 2 study including smoking risks discussed with patient.  - intermediate stage  - recommend amsler grid monitoring  3,4. Hypertensive retinopathy OU  - discussed importance of tight BP control  - monitor  5. Mixed form age related cataracts OS - The symptoms of cataract, surgical options, and treatments and risks were discussed with patient  - under the  expert management of Dr. Zenaida Niece  6. Pseudophakia OD  - s/p CE/IOL (Dr. Zenaida Niece, 11.15.23)  - IOL in good position, doing well  - monitor  7. Glaucoma Suspect OU  - IOP 15,16  - under the expert management of Dr. Zenaida Niece  8. Dry eyes OU  - decreased vision OD today mostly due to increased PEE / dry eyes  - pt reports stopping ATs after hearing about product recalls on ATs - recommend artificial tears and lubricating ointment as needed   Ophthalmic Meds Ordered this visit:  Meds ordered this encounter  Medications   faricimab-svoa (VABYSMO) 6mg /0.101mL intravitreal injection    This document serves as a record of services personally performed by Karie Chimera, MD, PhD. It was created on their behalf by De Blanch, an ophthalmic technician. The creation of this record is the provider's dictation and/or activities during the visit.    Electronically signed by: De Blanch, OA, 05/21/23  5:04 PM  Karie Chimera, M.D., Ph.D. Diseases & Surgery of the Retina and Vitreous [facility]  I have reviewed the above documentation for accuracy and completeness, and I agree with the above. Karie Chimera, M.D., Ph.D. 05/21/23 5:06 PM   Abbreviations: M myopia (nearsighted); A astigmatism; H hyperopia (farsighted); P presbyopia; Mrx spectacle prescription;  CTL contact lenses; OD right  eye; OS left eye; OU both eyes  XT exotropia; ET esotropia; PEK punctate epithelial keratitis; PEE punctate epithelial erosions; DES dry eye syndrome; MGD meibomian gland dysfunction; ATs artificial tears; PFAT's preservative free artificial tears; NSC nuclear sclerotic cataract; PSC posterior subcapsular cataract; ERM epi-retinal membrane; PVD posterior vitreous detachment; RD retinal detachment; DM diabetes mellitus; DR diabetic retinopathy; NPDR non-proliferative diabetic retinopathy; PDR proliferative diabetic retinopathy; CSME clinically significant macular edema; DME diabetic macular  edema; dbh dot blot hemorrhages; CWS cotton wool spot; POAG primary open angle glaucoma; C/D cup-to-disc ratio; HVF humphrey visual field; GVF goldmann visual field; OCT optical coherence tomography; IOP intraocular pressure; BRVO Branch retinal vein occlusion; CRVO central retinal vein occlusion; CRAO central retinal artery occlusion; BRAO branch retinal artery occlusion; RT retinal tear; SB scleral buckle; PPV pars plana vitrectomy; VH Vitreous hemorrhage; PRP panretinal laser photocoagulation; IVK intravitreal kenalog; VMT vitreomacular traction; MH Macular hole;  NVD neovascularization of the disc; NVE neovascularization elsewhere; AREDS age related eye disease study; ARMD age related macular degeneration; POAG primary open angle glaucoma; EBMD epithelial/anterior basement membrane dystrophy; ACIOL anterior chamber intraocular lens; IOL intraocular lens; PCIOL posterior chamber intraocular lens; Phaco/IOL phacoemulsification with intraocular lens placement; PRK photorefractive keratectomy; LASIK laser assisted in situ keratomileusis; HTN hypertension; DM diabetes mellitus; COPD chronic obstructive pulmonary disease

## 2023-05-19 ENCOUNTER — Encounter: Payer: Self-pay | Admitting: Physical Therapy

## 2023-05-19 ENCOUNTER — Ambulatory Visit (INDEPENDENT_AMBULATORY_CARE_PROVIDER_SITE_OTHER): Payer: Medicare Other | Admitting: Physical Therapy

## 2023-05-19 DIAGNOSIS — M25611 Stiffness of right shoulder, not elsewhere classified: Secondary | ICD-10-CM | POA: Diagnosis not present

## 2023-05-19 DIAGNOSIS — G8929 Other chronic pain: Secondary | ICD-10-CM | POA: Diagnosis not present

## 2023-05-19 DIAGNOSIS — M25511 Pain in right shoulder: Secondary | ICD-10-CM | POA: Diagnosis not present

## 2023-05-19 DIAGNOSIS — M6281 Muscle weakness (generalized): Secondary | ICD-10-CM

## 2023-05-19 NOTE — Therapy (Signed)
OUTPATIENT PHYSICAL THERAPY TREATMENT   Patient Name: Faith Reeves MRN: [id number] DOB:Sep 21, 1951, 72 y.o., female Today's Date: 05/19/2023  END OF SESSION:  PT End of Session - 05/19/23 1302     Visit Number 3    Number of Visits 16    Date for PT Re-Evaluation 06/26/23    Authorization Type Medicare, [place]    Progress Note Due on Visit 10    PT Start Time 1300    PT Stop Time 1341    PT Time Calculation (min) 41 min    Activity Tolerance Patient tolerated treatment well;Patient limited by pain    Behavior During Therapy WFL for tasks assessed/performed               Past Medical History:  Diagnosis Date   Allergy    Anemia    Anxiety    Arthritis    Blood transfusion without reported diagnosis    Cataract    GERD (gastroesophageal reflux disease)    Hypertension    Hypertensive retinopathy    OU   Macular degeneration    Past Surgical History:  Procedure Laterality Date   ABDOMINAL HYSTERECTOMY     ACHILLES TENDON REPAIR Bilateral    APPENDECTOMY     BACK SURGERY     BICEPT TENODESIS Right 04/15/2023   Procedure: RIGHT BICEPS TENODESIS;  Surgeon: Cammy Copa, MD;  Location: [facility] OR;  Service: Orthopedics;  Laterality: Right;   BILATERAL CARPAL TUNNEL RELEASE     CHOLECYSTECTOMY     COLONOSCOPY     EYE SURGERY Right 09/18/2022   cataract extraction   HERNIA REPAIR     JOINT REPLACEMENT     MELANOMA EXCISION Right 09/15/2017   Procedure: EXCISION LIPOSARCOMA RIGHT ARM;  Surgeon: Violeta Gelinas, MD;  Location: [facility] OR;  Service: General;  Laterality: Right;   REVERSE SHOULDER ARTHROPLASTY Right 04/15/2023   Procedure: RIGHT REVERSE SHOULDER ARTHROPLASTY;  Surgeon: Cammy Copa, MD;  Location: [facility] OR;  Service: Orthopedics;  Laterality: Right;   SPINE SURGERY     Patient Active Problem List   Diagnosis Date Noted   Arthritis of right shoulder region 05/04/2023   Biceps tendonitis on right 05/04/2023   S/P reverse total shoulder arthroplasty,  right 04/15/2023   Macular degeneration of right eye 12/18/2016   Allergy    Anxiety    Blood transfusion without reported diagnosis    Cataract    Hypertension     PCP: Donita Brooks, MD  REFERRING PROVIDER: Cammy Copa, MD  REFERRING DIAG: [phone] (ICD-10-CM) - S/P reverse total shoulder arthroplasty, right  Rationale for Evaluation and Treatment: Rehabilitation  THERAPY DIAG:  Chronic right shoulder pain  Muscle weakness (generalized)  Stiffness of right shoulder, not elsewhere classified  ONSET DATE: DOS: 04/15/23   SUBJECTIVE:  SUBJECTIVE STATEMENT: She has used machine with pillow to raise it up better. It does not seem to hurt as much and hand did not swell.   PERTINENT HISTORY:  anx, HTN, spine surgery, achillies tendon repair  PAIN:  Are you having pain?  Yes: NPRS scale: 6/10 currently, since last PT up to 8/10, at best 0/10 Pain location: Rt shoulder Pain description: aching, occasional "electric shock", stiffness Aggravating factors: any movement Relieving factors: icing  PRECAUTIONS:  Shoulder and Other: limit ER  WEIGHT BEARING RESTRICTIONS:  No  FALLS:  Has patient fallen in last 6 months? No  LIVING ENVIRONMENT: Lives with: lives with their spouse Lives in: House/apartment  OCCUPATION:  Retired: Product/process development scientist  PLOF:  Independent  PATIENT GOALS:  Improve pain, dress herself and toilet herself   OBJECTIVE:   PATIENT SURVEYS:  05/01/23: FOTO 45 (predicted 61)  COGNITIVE STATUS: Within functional limits for tasks assessed   POSTURE:  rounded shoulders and forward head  HAND DOMINANCE:  Right  GAIT: 05/01/23 Comments: independent   Shoulder  PALPATION: Mild tenderness along incision; swelling present  UPPER EXTREMITY  ROM:  ROM Right eval  Shoulder flexion P: 107  Shoulder extension   Shoulder abduction P:100  Shoulder adduction   Shoulder extension   Shoulder internal rotation   Shoulder external rotation P: 25   (Blank rows = not tested)   UPPER EXTREMITY MMT:  05/01/23: Deferred today due to post op status  MMT Right eval Left eval  Shoulder flexion    Shoulder extension    Shoulder abduction    Shoulder adduction    Shoulder extension    Shoulder internal rotation    Shoulder external rotation    Middle trapezius    Lower trapezius    Elbow flexion    Elbow extension    Wrist flexion    Wrist extension    Wrist ulnar deviation    Wrist radial deviation    Wrist pronation    Wrist supination    Grip strength     (Blank rows = not tested)   TREATMENT:                                                                                                                              DATE:  05/19/2023 Therapeutic Exercise:  PT used shoulder model for anatomy including why scapula position / posture is important. Seated with RUE supported on yoga block: scapula retraction 3 sec hold 15 reps 2 sets. Tactile & verbal cues to prevent elevation. Palpation with tenderness Levator Scapulae and biceps tendon (distal scar) Isometric with towel at door frame: abduction / deltoid 3 sec hold 10 reps 2 sets Isometric with towel at door frame: flexion 3 sec hold 10 reps 2 sets Leaning over low bed so Rt hand directly under shoulder sliding into flexion with no weight on arm (LUE supporting) 10 reps  Manual Therapy: PROM for shoulder flexion, abduction and ER  to 30* Soft tissue technique to biceps tendon, scar mobs and Levator Scapulae.    05/13/2023: Therapeutic Exercise: PT demo & verbal cues on 30* ER precaution & rationale. Washing her hair probably requires >30* ER. Pt verbalized understanding.  Pt & husband demo current use of pneumatic machine for PROM that is causing increase in shoulder  pain & swelling in her hand. Due to pt's ht, she has lateral trunk lean to seat her RUE into machine and her hand dips lower than elbow also.  PT placed 4" step under machine which enabled a more upright posture and ability to keep her RUE seated in the machine.  Patient also demonstrated how to keep the machine level so that her forearm is horizontal and decreases dependent edema.  Patient and husband verbalized understanding. PT educated patient on upper body posture decreasing head forward and rounded shoulders to improve shoulder mobility.  PT recommended continuing shoulder retraction exercises frequently during the day to discourage the rounded shoulder posture.  Patient verbalized understanding PT reviewed HEP using MedBridge and verbal cues.  PT recommended starting and stopping pendulum exercises with a small range of motion and progressively increasing then decreasing to avoid muscle spasms.  PT also recommended standing upright with scapular retraction between each motion.  PT demo slides along table with recommendation to consider putting right arm and side of pillowcase to decrease resistance with table.  Patient reported this was easier with less shoulder discomfort.  Patient and husband verbalized better understanding of HEP.  Manual therapy: Supine PROM to right shoulder for flexion, abduction and external rotation up to 30 degrees.   05/01/23  See HEP - performed trial reps of each exercise with min cues needed for technique   PATIENT EDUCATION:  Education details: HEP Person educated: Patient and Spouse Education method: Explanation, Demonstration, and Handouts Education comprehension: verbalized understanding, returned demonstration, and needs further education  HOME EXERCISE PROGRAM: Access Code: [email or web address] URL: [email or web address] Date: 05/01/2023 Prepared by: Moshe Cipro  Exercises - Circular Shoulder Pendulum with Table Support  - 3 x daily - 7 x  weekly - 1 sets - 10 reps - Flexion-Extension Shoulder Pendulum with Table Support  - 3 x daily - 7 x weekly - 1 sets - 10 reps - Horizontal Shoulder Pendulum with Table Support  - 3 x daily - 7 x weekly - 1 sets - 10 reps - Standing Scapular Retraction  - 3 x daily - 7 x weekly - 1 sets - 10 reps - 5 sec hold - Seated Shoulder Flexion Towel Slide at Table Top  - 3 x daily - 7 x weekly - 1 sets - 10 reps - Seated Shoulder Scaption Slide at Table Top with Forearm in Neutral  - 3 x daily - 7 x weekly - 1 sets - 10 reps   ASSESSMENT:  CLINICAL IMPRESSION: Patient improved scapular retraction with cues to limit elevation. PT added isometric abd & flex to exercises in clinic which required tactile cues.     OBJECTIVE IMPAIRMENTS: decreased ROM, decreased strength, hypomobility, increased edema, increased fascial restrictions, impaired UE functional use, postural dysfunction, and pain.   ACTIVITY LIMITATIONS: carrying, lifting, sleeping, toileting, dressing, hygiene/grooming, and locomotion level  PARTICIPATION LIMITATIONS: meal prep, cleaning, laundry, driving, shopping, and community activity  PERSONAL FACTORS: 3+ comorbidities: anx, HTN, spine surgery, achillies tendon repair  are also affecting patient's functional outcome.   REHAB POTENTIAL: Good  CLINICAL DECISION MAKING: Evolving/moderate complexity  EVALUATION COMPLEXITY: Moderate   GOALS: Goals reviewed  with patient? Yes  SHORT TERM GOALS: Target date: 05/29/2023   Independent with initial HEP Goal status: Ongoing 05/13/2023  2.  Rt shoulder PROM improved 15 deg all directions for improved function Goal status:  Ongoing 05/13/2023   LONG TERM GOALS: Target date: 06/26/2023  Independent with final HEP Goal status: Ongoing 05/13/2023  2.  FOTO score improved to 61 Goal status: Ongoing 05/13/2023  3.  Rt shoulder flexion and abduction AROM at least 120 deg for improved ADLs and independence Goal status: Ongoing 05/13/2023  4.   Report pain < 3/10 with reaching activities for improved function Goal status: Ongoing 05/13/2023  5.  Demonstrate at least 4/5 Rt shoulder flexion and abduction strength for improved function Goal status: Ongoing 05/13/2023   PLAN:  PT FREQUENCY: 2x/week  PT DURATION: 8 weeks  PLANNED INTERVENTIONS: Therapeutic exercises, Therapeutic activity, Neuromuscular re-education, Patient/Family education, Self Care, Joint mobilization, Aquatic Therapy, Dry Needling, Electrical stimulation, Cryotherapy, Moist heat, Taping, Vasopneumatic device, Ultrasound, Ionotophoresis 4mg /ml Dexamethasone, Manual therapy, and Re-evaluation.  PLAN FOR NEXT SESSION: measure ROM, continue with PROM and add deltoid isometrics to HEP, limit ER to no > 30 deg for now.     NEXT MD VISIT: 05/28/23   Vladimir Faster, PT, DPT 05/19/2023, 2:50 PM

## 2023-05-21 ENCOUNTER — Encounter (INDEPENDENT_AMBULATORY_CARE_PROVIDER_SITE_OTHER): Payer: Self-pay | Admitting: Ophthalmology

## 2023-05-21 ENCOUNTER — Telehealth: Payer: Self-pay

## 2023-05-21 ENCOUNTER — Ambulatory Visit (INDEPENDENT_AMBULATORY_CARE_PROVIDER_SITE_OTHER): Payer: Medicare Other | Admitting: Ophthalmology

## 2023-05-21 DIAGNOSIS — H40003 Preglaucoma, unspecified, bilateral: Secondary | ICD-10-CM

## 2023-05-21 DIAGNOSIS — H04123 Dry eye syndrome of bilateral lacrimal glands: Secondary | ICD-10-CM

## 2023-05-21 DIAGNOSIS — H353211 Exudative age-related macular degeneration, right eye, with active choroidal neovascularization: Secondary | ICD-10-CM

## 2023-05-21 DIAGNOSIS — I10 Essential (primary) hypertension: Secondary | ICD-10-CM | POA: Diagnosis not present

## 2023-05-21 DIAGNOSIS — Z961 Presence of intraocular lens: Secondary | ICD-10-CM | POA: Diagnosis not present

## 2023-05-21 DIAGNOSIS — H25812 Combined forms of age-related cataract, left eye: Secondary | ICD-10-CM | POA: Diagnosis not present

## 2023-05-21 DIAGNOSIS — H353122 Nonexudative age-related macular degeneration, left eye, intermediate dry stage: Secondary | ICD-10-CM | POA: Diagnosis not present

## 2023-05-21 DIAGNOSIS — H35033 Hypertensive retinopathy, bilateral: Secondary | ICD-10-CM | POA: Diagnosis not present

## 2023-05-21 MED ORDER — FARICIMAB-SVOA 6 MG/0.05ML IZ SOLN
6.0000 mg | INTRAVITREAL | Status: AC | PRN
Start: 2023-05-21 — End: 2023-05-21
  Administered 2023-05-21: 6 mg via INTRAVITREAL

## 2023-05-21 NOTE — Telephone Encounter (Signed)
Faith Reeves (Key: [id number])  Your information has been submitted to [employer]. [employer] will review the request and will issue a decision, typically within 1-3 days from your submission. You can check the updated outcome later by reopening this request.  If [employer] has not responded in 1-3 days or if you have any questions about your ePA request, please contact [employer] at [phone]. If you think there may be a problem with your PA request, use our live chat feature at the bottom right.

## 2023-05-22 ENCOUNTER — Ambulatory Visit (INDEPENDENT_AMBULATORY_CARE_PROVIDER_SITE_OTHER): Payer: Medicare Other | Admitting: Physical Therapy

## 2023-05-22 ENCOUNTER — Encounter: Payer: Self-pay | Admitting: Physical Therapy

## 2023-05-22 DIAGNOSIS — M25511 Pain in right shoulder: Secondary | ICD-10-CM

## 2023-05-22 DIAGNOSIS — M6281 Muscle weakness (generalized): Secondary | ICD-10-CM | POA: Diagnosis not present

## 2023-05-22 DIAGNOSIS — G8929 Other chronic pain: Secondary | ICD-10-CM

## 2023-05-22 DIAGNOSIS — M25611 Stiffness of right shoulder, not elsewhere classified: Secondary | ICD-10-CM | POA: Diagnosis not present

## 2023-05-22 NOTE — Therapy (Signed)
OUTPATIENT PHYSICAL THERAPY TREATMENT   Patient Name: Faith Reeves MRN: [id number] DOB:[birth year], 72 y.o., female Today's Date: 05/22/2023  END OF SESSION:  PT End of Session - 05/22/23 1344     Visit Number 4    Number of Visits 16    Date for PT Re-Evaluation 06/26/23    Authorization Type Medicare, [place]    Progress Note Due on Visit 10    PT Start Time 1310   pt late   PT Stop Time 1341    PT Time Calculation (min) 31 min    Activity Tolerance Patient tolerated treatment well    Behavior During Therapy WFL for tasks assessed/performed                Past Medical History:  Diagnosis Date   Allergy    Anemia    Anxiety    Arthritis    Blood transfusion without reported diagnosis    Cataract    GERD (gastroesophageal reflux disease)    Hypertension    Hypertensive retinopathy    OU   Macular degeneration    Past Surgical History:  Procedure Laterality Date   ABDOMINAL HYSTERECTOMY     ACHILLES TENDON REPAIR Bilateral    APPENDECTOMY     BACK SURGERY     BICEPT TENODESIS Right 04/15/2023   Procedure: RIGHT BICEPS TENODESIS;  Surgeon: Cammy Copa, MD;  Location: [facility] OR;  Service: Orthopedics;  Laterality: Right;   BILATERAL CARPAL TUNNEL RELEASE     CHOLECYSTECTOMY     COLONOSCOPY     EYE SURGERY Right 09/18/2022   cataract extraction   HERNIA REPAIR     JOINT REPLACEMENT     MELANOMA EXCISION Right 09/15/2017   Procedure: EXCISION LIPOSARCOMA RIGHT ARM;  Surgeon: Violeta Gelinas, MD;  Location: [facility] OR;  Service: General;  Laterality: Right;   REVERSE SHOULDER ARTHROPLASTY Right 04/15/2023   Procedure: RIGHT REVERSE SHOULDER ARTHROPLASTY;  Surgeon: Cammy Copa, MD;  Location: [facility] OR;  Service: Orthopedics;  Laterality: Right;   SPINE SURGERY     Patient Active Problem List   Diagnosis Date Noted   Arthritis of right shoulder region 05/04/2023   Biceps tendonitis on right 05/04/2023   S/P reverse total shoulder arthroplasty, right  04/15/2023   Macular degeneration of right eye 12/18/2016   Allergy    Anxiety    Blood transfusion without reported diagnosis    Cataract    Hypertension     PCP: Donita Brooks, MD  REFERRING PROVIDER: Cammy Copa, MD  REFERRING DIAG: [phone] (ICD-10-CM) - S/P reverse total shoulder arthroplasty, right  Rationale for Evaluation and Treatment: Rehabilitation  THERAPY DIAG:  Chronic right shoulder pain  Muscle weakness (generalized)  Stiffness of right shoulder, not elsewhere classified  ONSET DATE: DOS: 04/15/23   SUBJECTIVE:  SUBJECTIVE STATEMENT:  I can do some things, I can't do everything. We need to speed this up, I can do certain things but not others. We need to make this go faster, I want my shoulder back.   PERTINENT HISTORY:  anx, HTN, spine surgery, achillies tendon repair  PAIN:  Are you having pain?  Yes: NPRS scale: 1/10 currently Pain location: Rt shoulder Pain description: aching, "teasing because I can't do what I need to do"  Aggravating factors: any movement Relieving factors: icing  PRECAUTIONS:  Shoulder and Other: limit ER  WEIGHT BEARING RESTRICTIONS:  No  FALLS:  Has patient fallen in last 6 months? No  LIVING ENVIRONMENT: Lives with: lives with their spouse Lives in: House/apartment  OCCUPATION:  Retired: Product/process development scientist  PLOF:  Independent  PATIENT GOALS:  Improve pain, dress herself and toilet herself   OBJECTIVE:   PATIENT SURVEYS:  05/01/23: FOTO 45 (predicted 61)  COGNITIVE STATUS: Within functional limits for tasks assessed   POSTURE:  rounded shoulders and forward head  HAND DOMINANCE:  Right  GAIT: 05/01/23 Comments: independent   Shoulder  PALPATION: Mild tenderness along incision; swelling present  UPPER  EXTREMITY ROM:  ROM Right eval  Shoulder flexion P: 107  Shoulder extension   Shoulder abduction P:100  Shoulder adduction   Shoulder extension   Shoulder internal rotation   Shoulder external rotation P: 25   (Blank rows = not tested)   UPPER EXTREMITY MMT:  05/01/23: Deferred today due to post op status  MMT Right eval Left eval  Shoulder flexion    Shoulder extension    Shoulder abduction    Shoulder adduction    Shoulder extension    Shoulder internal rotation    Shoulder external rotation    Middle trapezius    Lower trapezius    Elbow flexion    Elbow extension    Wrist flexion    Wrist extension    Wrist ulnar deviation    Wrist radial deviation    Wrist pronation    Wrist supination    Grip strength     (Blank rows = not tested)   TREATMENT:                                                                                                                              DATE:   05/22/23  TherEx  PROM shoulder flexion, ABD, ER (no more than 30*) as appropriate  AAROM into flexion 90* x10 AAROM into scaption 90*  only x10 Shoulder flexion, ABD x10 each on the wall Very gentle ER isometrics (20% effort) starting with UE in internally rotated position on stomach x10  Encouraged following surgical/UE use precautions on her own at home, allowing correct healing time rather than pushing or progressing too fast      05/19/2023 Therapeutic Exercise:  PT used shoulder model for anatomy including why scapula position / posture is important. Seated with RUE supported on  yoga block: scapula retraction 3 sec hold 15 reps 2 sets. Tactile & verbal cues to prevent elevation. Palpation with tenderness Levator Scapulae and biceps tendon (distal scar) Isometric with towel at door frame: abduction / deltoid 3 sec hold 10 reps 2 sets Isometric with towel at door frame: flexion 3 sec hold 10 reps 2 sets Leaning over low bed so Rt hand directly under shoulder sliding into  flexion with no weight on arm (LUE supporting) 10 reps  Manual Therapy: PROM for shoulder flexion, abduction and ER to 30* Soft tissue technique to biceps tendon, scar mobs and Levator Scapulae.    05/13/2023: Therapeutic Exercise: PT demo & verbal cues on 30* ER precaution & rationale. Washing her hair probably requires >30* ER. Pt verbalized understanding.  Pt & husband demo current use of pneumatic machine for PROM that is causing increase in shoulder pain & swelling in her hand. Due to pt's ht, she has lateral trunk lean to seat her RUE into machine and her hand dips lower than elbow also.  PT placed 4" step under machine which enabled a more upright posture and ability to keep her RUE seated in the machine.  Patient also demonstrated how to keep the machine level so that her forearm is horizontal and decreases dependent edema.  Patient and husband verbalized understanding. PT educated patient on upper body posture decreasing head forward and rounded shoulders to improve shoulder mobility.  PT recommended continuing shoulder retraction exercises frequently during the day to discourage the rounded shoulder posture.  Patient verbalized understanding PT reviewed HEP using MedBridge and verbal cues.  PT recommended starting and stopping pendulum exercises with a small range of motion and progressively increasing then decreasing to avoid muscle spasms.  PT also recommended standing upright with scapular retraction between each motion.  PT demo slides along table with recommendation to consider putting right arm and side of pillowcase to decrease resistance with table.  Patient reported this was easier with less shoulder discomfort.  Patient and husband verbalized better understanding of HEP.  Manual therapy: Supine PROM to right shoulder for flexion, abduction and external rotation up to 30 degrees.   05/01/23  See HEP - performed trial reps of each exercise with min cues needed for  technique   PATIENT EDUCATION:  Education details: HEP Person educated: Patient and Spouse Education method: Explanation, Demonstration, and Handouts Education comprehension: verbalized understanding, returned demonstration, and needs further education  HOME EXERCISE PROGRAM: Access Code: [email or web address] URL: [email or web address] Date: 05/01/2023 Prepared by: Moshe Cipro  Exercises - Circular Shoulder Pendulum with Table Support  - 3 x daily - 7 x weekly - 1 sets - 10 reps - Flexion-Extension Shoulder Pendulum with Table Support  - 3 x daily - 7 x weekly - 1 sets - 10 reps - Horizontal Shoulder Pendulum with Table Support  - 3 x daily - 7 x weekly - 1 sets - 10 reps - Standing Scapular Retraction  - 3 x daily - 7 x weekly - 1 sets - 10 reps - 5 sec hold - Seated Shoulder Flexion Towel Slide at Table Top  - 3 x daily - 7 x weekly - 1 sets - 10 reps - Seated Shoulder Scaption Slide at Table Top with Forearm in Neutral  - 3 x daily - 7 x weekly - 1 sets - 10 reps   ASSESSMENT:  CLINICAL IMPRESSION:   Faith Reeves arrives today doing OK. We kept working on ROM as tolerated and progressed gently as appropriate. Doing  well, remains motivated to improve but did need some education on preserving healing time while still progressing exercise as appropriately.    OBJECTIVE IMPAIRMENTS: decreased ROM, decreased strength, hypomobility, increased edema, increased fascial restrictions, impaired UE functional use, postural dysfunction, and pain.   ACTIVITY LIMITATIONS: carrying, lifting, sleeping, toileting, dressing, hygiene/grooming, and locomotion level  PARTICIPATION LIMITATIONS: meal prep, cleaning, laundry, driving, shopping, and community activity  PERSONAL FACTORS: 3+ comorbidities: anx, HTN, spine surgery, achillies tendon repair  are also affecting patient's functional outcome.   REHAB POTENTIAL: Good  CLINICAL DECISION MAKING: Evolving/moderate complexity  EVALUATION  COMPLEXITY: Moderate   GOALS: Goals reviewed with patient? Yes  SHORT TERM GOALS: Target date: 05/29/2023   Independent with initial HEP Goal status: Ongoing 05/13/2023  2.  Rt shoulder PROM improved 15 deg all directions for improved function Goal status:  Ongoing 05/13/2023   LONG TERM GOALS: Target date: 06/26/2023  Independent with final HEP Goal status: Ongoing 05/13/2023  2.  FOTO score improved to 61 Goal status: Ongoing 05/13/2023  3.  Rt shoulder flexion and abduction AROM at least 120 deg for improved ADLs and independence Goal status: Ongoing 05/13/2023  4.  Report pain < 3/10 with reaching activities for improved function Goal status: Ongoing 05/13/2023  5.  Demonstrate at least 4/5 Rt shoulder flexion and abduction strength for improved function Goal status: Ongoing 05/13/2023   PLAN:  PT FREQUENCY: 2x/week  PT DURATION: 8 weeks  PLANNED INTERVENTIONS: Therapeutic exercises, Therapeutic activity, Neuromuscular re-education, Patient/Family education, Self Care, Joint mobilization, Aquatic Therapy, Dry Needling, Electrical stimulation, Cryotherapy, Moist heat, Taping, Vasopneumatic device, Ultrasound, Ionotophoresis 4mg /ml Dexamethasone, Manual therapy, and Re-evaluation.  PLAN FOR NEXT SESSION: measure ROM, continue with PROM and add deltoid isometrics to HEP, limit ER to no > 30 deg for now.     NEXT MD VISIT: 05/28/23  Nedra Hai, PT, DPT 05/22/23 1:44 PM

## 2023-05-26 ENCOUNTER — Ambulatory Visit (INDEPENDENT_AMBULATORY_CARE_PROVIDER_SITE_OTHER): Payer: Medicare Other | Admitting: Physical Therapy

## 2023-05-26 ENCOUNTER — Encounter: Payer: Self-pay | Admitting: Physical Therapy

## 2023-05-26 DIAGNOSIS — M6281 Muscle weakness (generalized): Secondary | ICD-10-CM | POA: Diagnosis not present

## 2023-05-26 DIAGNOSIS — G8929 Other chronic pain: Secondary | ICD-10-CM

## 2023-05-26 DIAGNOSIS — M25611 Stiffness of right shoulder, not elsewhere classified: Secondary | ICD-10-CM

## 2023-05-26 DIAGNOSIS — M25511 Pain in right shoulder: Secondary | ICD-10-CM

## 2023-05-26 DIAGNOSIS — M542 Cervicalgia: Secondary | ICD-10-CM | POA: Diagnosis not present

## 2023-05-26 NOTE — Therapy (Signed)
OUTPATIENT PHYSICAL THERAPY TREATMENT   Patient Name: Faith Reeves MRN: [id number] DOB:1951-03-15, 72 y.o., female Today's Date: 05/26/2023  END OF SESSION:  PT End of Session - 05/26/23 1256     Visit Number 5    Number of Visits 16    Date for PT Re-Evaluation 06/26/23    Authorization Type Medicare, [place]    Progress Note Due on Visit 10    PT Start Time 1300    PT Stop Time 1344    PT Time Calculation (min) 44 min    Activity Tolerance Patient tolerated treatment well    Behavior During Therapy WFL for tasks assessed/performed                 Past Medical History:  Diagnosis Date   Allergy    Anemia    Anxiety    Arthritis    Blood transfusion without reported diagnosis    Cataract    GERD (gastroesophageal reflux disease)    Hypertension    Hypertensive retinopathy    OU   Macular degeneration    Past Surgical History:  Procedure Laterality Date   ABDOMINAL HYSTERECTOMY     ACHILLES TENDON REPAIR Bilateral    APPENDECTOMY     BACK SURGERY     BICEPT TENODESIS Right 04/15/2023   Procedure: RIGHT BICEPS TENODESIS;  Surgeon: Cammy Copa, MD;  Location: [facility] OR;  Service: Orthopedics;  Laterality: Right;   BILATERAL CARPAL TUNNEL RELEASE     CHOLECYSTECTOMY     COLONOSCOPY     EYE SURGERY Right 09/18/2022   cataract extraction   HERNIA REPAIR     JOINT REPLACEMENT     MELANOMA EXCISION Right 09/15/2017   Procedure: EXCISION LIPOSARCOMA RIGHT ARM;  Surgeon: Violeta Gelinas, MD;  Location: [facility] OR;  Service: General;  Laterality: Right;   REVERSE SHOULDER ARTHROPLASTY Right 04/15/2023   Procedure: RIGHT REVERSE SHOULDER ARTHROPLASTY;  Surgeon: Cammy Copa, MD;  Location: [facility] OR;  Service: Orthopedics;  Laterality: Right;   SPINE SURGERY     Patient Active Problem List   Diagnosis Date Noted   Arthritis of right shoulder region 05/04/2023   Biceps tendonitis on right 05/04/2023   S/P reverse total shoulder arthroplasty, right 04/15/2023    Macular degeneration of right eye 12/18/2016   Allergy    Anxiety    Blood transfusion without reported diagnosis    Cataract    Hypertension     PCP: Donita Brooks, MD  REFERRING PROVIDER: Cammy Copa, MD  REFERRING DIAG: [phone] (ICD-10-CM) - S/P reverse total shoulder arthroplasty, right  Rationale for Evaluation and Treatment: Rehabilitation  THERAPY DIAG:  Chronic right shoulder pain  Muscle weakness (generalized)  Stiffness of right shoulder, not elsewhere classified  Cervicalgia  ONSET DATE: DOS: 04/15/23   SUBJECTIVE:  SUBJECTIVE STATEMENT: More sore from working around the house over the weekend.  PERTINENT HISTORY:  anx, HTN, spine surgery, achillies tendon repair  PAIN:  Are you having pain?  Yes: NPRS scale: 1/10 currently Pain location: Rt shoulder Pain description: aching, "teasing because I can't do what I need to do"  Aggravating factors: any movement Relieving factors: icing  PRECAUTIONS:  Shoulder and Other: limit ER  WEIGHT BEARING RESTRICTIONS:  No  FALLS:  Has patient fallen in last 6 months? No  LIVING ENVIRONMENT: Lives with: lives with their spouse Lives in: House/apartment  OCCUPATION:  Retired: Product/process development scientist  PLOF:  Independent  PATIENT GOALS:  Improve pain, dress herself and toilet herself   OBJECTIVE:   PATIENT SURVEYS:  05/01/23: FOTO 45 (predicted 61)  COGNITIVE STATUS: Within functional limits for tasks assessed   POSTURE:  rounded shoulders and forward head  HAND DOMINANCE:  Right  GAIT: 05/01/23 Comments: independent   Shoulder  PALPATION: Mild tenderness along incision; swelling present  UPPER EXTREMITY ROM:  ROM Right eval  Shoulder flexion P: 107  Shoulder extension   Shoulder abduction P:100   Shoulder adduction   Shoulder extension   Shoulder internal rotation   Shoulder external rotation P: 25   (Blank rows = not tested)   UPPER EXTREMITY MMT:  05/01/23: Deferred today due to post op status  MMT Right eval Left eval  Shoulder flexion    Shoulder extension    Shoulder abduction    Shoulder adduction    Shoulder extension    Shoulder internal rotation    Shoulder external rotation    Middle trapezius    Lower trapezius    Elbow flexion    Elbow extension    Wrist flexion    Wrist extension    Wrist ulnar deviation    Wrist radial deviation    Wrist pronation    Wrist supination    Grip strength     (Blank rows = not tested)   TREATMENT:                                                                                                                              DATE:  05/26/23 TherEx Pulleys x 3 min flexion and scaption Seated AA flexion 2x10; 2# bar Seated AA scaption 2x10; 2# bar Wall ladder flexion and scaption x10 reps each Rows L3 band 2x10 Shoulder ext L3 band 2x10 Supine active flexion with elbow flexed 2x10   05/22/23 TherEx PROM shoulder flexion, ABD, ER (no more than 30*) as appropriate  AAROM into flexion 90* x10 AAROM into scaption 90*  only x10 Shoulder flexion, ABD x10 each on the wall Very gentle ER isometrics (20% effort) starting with UE in internally rotated position on stomach x10  Encouraged following surgical/UE use precautions on her own at home, allowing correct healing time rather than pushing or progressing too fast      05/19/2023 Therapeutic Exercise:  PT used  shoulder model for anatomy including why scapula position / posture is important. Seated with RUE supported on yoga block: scapula retraction 3 sec hold 15 reps 2 sets. Tactile & verbal cues to prevent elevation. Palpation with tenderness Levator Scapulae and biceps tendon (distal scar) Isometric with towel at door frame: abduction / deltoid 3 sec hold 10 reps 2  sets Isometric with towel at door frame: flexion 3 sec hold 10 reps 2 sets Leaning over low bed so Rt hand directly under shoulder sliding into flexion with no weight on arm (LUE supporting) 10 reps  Manual Therapy: PROM for shoulder flexion, abduction and ER to 30* Soft tissue technique to biceps tendon, scar mobs and Levator Scapulae.    PATIENT EDUCATION:  Education details: HEP Person educated: Patient and Spouse Education method: Explanation, Facilities manager, and Handouts Education comprehension: verbalized understanding, returned demonstration, and needs further education  HOME EXERCISE PROGRAM: Access Code: [email or web address] URL: [email or web address] Date: 05/01/2023 Prepared by: Moshe Cipro  Exercises - Circular Shoulder Pendulum with Table Support  - 3 x daily - 7 x weekly - 1 sets - 10 reps - Flexion-Extension Shoulder Pendulum with Table Support  - 3 x daily - 7 x weekly - 1 sets - 10 reps - Horizontal Shoulder Pendulum with Table Support  - 3 x daily - 7 x weekly - 1 sets - 10 reps - Standing Scapular Retraction  - 3 x daily - 7 x weekly - 1 sets - 10 reps - 5 sec hold - Seated Shoulder Flexion Towel Slide at Table Top  - 3 x daily - 7 x weekly - 1 sets - 10 reps - Seated Shoulder Scaption Slide at Table Top with Forearm in Neutral  - 3 x daily - 7 x weekly - 1 sets - 10 reps   ASSESSMENT:  CLINICAL IMPRESSION:  Able to progress exercises today with positive response.  Will continue to benefit from PT to maximize function.   OBJECTIVE IMPAIRMENTS: decreased ROM, decreased strength, hypomobility, increased edema, increased fascial restrictions, impaired UE functional use, postural dysfunction, and pain.   ACTIVITY LIMITATIONS: carrying, lifting, sleeping, toileting, dressing, hygiene/grooming, and locomotion level  PARTICIPATION LIMITATIONS: meal prep, cleaning, laundry, driving, shopping, and community activity  PERSONAL FACTORS: 3+ comorbidities: anx,  HTN, spine surgery, achillies tendon repair  are also affecting patient's functional outcome.   REHAB POTENTIAL: Good  CLINICAL DECISION MAKING: Evolving/moderate complexity  EVALUATION COMPLEXITY: Moderate   GOALS: Goals reviewed with patient? Yes  SHORT TERM GOALS: Target date: 05/29/2023   Independent with initial HEP Goal status: Ongoing 05/13/2023  2.  Rt shoulder PROM improved 15 deg all directions for improved function Goal status:  Ongoing 05/13/2023   LONG TERM GOALS: Target date: 06/26/2023  Independent with final HEP Goal status: Ongoing 05/13/2023  2.  FOTO score improved to 61 Goal status: Ongoing 05/13/2023  3.  Rt shoulder flexion and abduction AROM at least 120 deg for improved ADLs and independence Goal status: Ongoing 05/13/2023  4.  Report pain < 3/10 with reaching activities for improved function Goal status: Ongoing 05/13/2023  5.  Demonstrate at least 4/5 Rt shoulder flexion and abduction strength for improved function Goal status: Ongoing 05/13/2023   PLAN:  PT FREQUENCY: 2x/week  PT DURATION: 8 weeks  PLANNED INTERVENTIONS: Therapeutic exercises, Therapeutic activity, Neuromuscular re-education, Patient/Family education, Self Care, Joint mobilization, Aquatic Therapy, Dry Needling, Electrical stimulation, Cryotherapy, Moist heat, Taping, Vasopneumatic device, Ultrasound, Ionotophoresis 4mg /ml Dexamethasone, Manual therapy, and Re-evaluation.  PLAN FOR NEXT SESSION: see  what MD says   NEXT MD VISIT: 05/28/23    Clarita Crane, PT, DPT 05/26/23 1:44 PM

## 2023-05-26 NOTE — Telephone Encounter (Signed)
Patient's husband Mr. Godette called to follow up on new script for [facility].  Stated he spoke with pharmacy [facility] as advised by Dr. Tanya Nones regarding script. Pharmacy advised patient they need provider to fax a script for [facility] to them at [phone].   Their direct number is [phone].  Mr. Goodridge' number is [phone]

## 2023-05-28 ENCOUNTER — Telehealth: Payer: Self-pay | Admitting: *Deleted

## 2023-05-28 ENCOUNTER — Encounter: Payer: Self-pay | Admitting: Physical Therapy

## 2023-05-28 ENCOUNTER — Ambulatory Visit (INDEPENDENT_AMBULATORY_CARE_PROVIDER_SITE_OTHER): Payer: Medicare Other | Admitting: Orthopedic Surgery

## 2023-05-28 ENCOUNTER — Ambulatory Visit (INDEPENDENT_AMBULATORY_CARE_PROVIDER_SITE_OTHER): Payer: Medicare Other | Admitting: Physical Therapy

## 2023-05-28 ENCOUNTER — Encounter: Payer: Self-pay | Admitting: Orthopedic Surgery

## 2023-05-28 DIAGNOSIS — M25511 Pain in right shoulder: Secondary | ICD-10-CM

## 2023-05-28 DIAGNOSIS — G8929 Other chronic pain: Secondary | ICD-10-CM | POA: Diagnosis not present

## 2023-05-28 DIAGNOSIS — M6281 Muscle weakness (generalized): Secondary | ICD-10-CM

## 2023-05-28 DIAGNOSIS — M25611 Stiffness of right shoulder, not elsewhere classified: Secondary | ICD-10-CM

## 2023-05-28 DIAGNOSIS — M542 Cervicalgia: Secondary | ICD-10-CM | POA: Diagnosis not present

## 2023-05-28 DIAGNOSIS — Z96611 Presence of right artificial shoulder joint: Secondary | ICD-10-CM

## 2023-05-28 NOTE — Progress Notes (Signed)
Post-Op Visit Note   Patient: Faith Reeves           Date of Birth: 1951/10/25           MRN: [id number] Visit Date: 05/28/2023 PCP: Donita Brooks, MD   Assessment & Plan:  Chief Complaint:  Chief Complaint  Patient presents with   Right Shoulder - Routine Post Op    RIGHT RSA, BICEPS TENODESIS (surgery date 04-15-23)   Visit Diagnoses:  1. History of arthroplasty of right shoulder     Plan: Caylah is a 72 year old patient is now about 5 weeks out right reverse shoulder replacement with biceps tenodesis 04/15/2023.  Doing well.  On examination range of motion is 50/100/150.  Is able to get her hand behind her head.  Strength is improving.  Subscap is 5- out of 5.  Plan at this time is to get the door pulley to get a little bit more forward flexion.  She will continue with home exercise program and follow-up as needed.  Therapy did finish today.  Follow-Up Instructions: No follow-ups on file.   Orders:  No orders of the defined types were placed in this encounter.  No orders of the defined types were placed in this encounter.   Imaging: No results found.  PMFS History: Patient Active Problem List   Diagnosis Date Noted   Arthritis of right shoulder region 05/04/2023   Biceps tendonitis on right 05/04/2023   S/P reverse total shoulder arthroplasty, right 04/15/2023   Macular degeneration of right eye 12/18/2016   Allergy    Anxiety    Blood transfusion without reported diagnosis    Cataract    Hypertension    Past Medical History:  Diagnosis Date   Allergy    Anemia    Anxiety    Arthritis    Blood transfusion without reported diagnosis    Cataract    GERD (gastroesophageal reflux disease)    Hypertension    Hypertensive retinopathy    OU   Macular degeneration     Family History  Problem Relation Age of Onset   Hypertension Mother    Cancer Sister    Stroke Maternal Grandmother     Past Surgical History:  Procedure Laterality Date   ABDOMINAL  HYSTERECTOMY     ACHILLES TENDON REPAIR Bilateral    APPENDECTOMY     BACK SURGERY     BICEPT TENODESIS Right 04/15/2023   Procedure: RIGHT BICEPS TENODESIS;  Surgeon: Cammy Copa, MD;  Location: [facility] OR;  Service: Orthopedics;  Laterality: Right;   BILATERAL CARPAL TUNNEL RELEASE     CHOLECYSTECTOMY     COLONOSCOPY     EYE SURGERY Right 09/18/2022   cataract extraction   HERNIA REPAIR     JOINT REPLACEMENT     MELANOMA EXCISION Right 09/15/2017   Procedure: EXCISION LIPOSARCOMA RIGHT ARM;  Surgeon: Violeta Gelinas, MD;  Location: [facility] OR;  Service: General;  Laterality: Right;   REVERSE SHOULDER ARTHROPLASTY Right 04/15/2023   Procedure: RIGHT REVERSE SHOULDER ARTHROPLASTY;  Surgeon: Cammy Copa, MD;  Location: [facility] OR;  Service: Orthopedics;  Laterality: Right;   SPINE SURGERY     Social History   Occupational History   Not on file  Tobacco Use   Smoking status: Never   Smokeless tobacco: Never  Vaping Use   Vaping status: Never Used  Substance and Sexual Activity   Alcohol use: Yes    Alcohol/week: 1.0 standard drink of alcohol    Types:  1 Glasses of wine per week   Drug use: No   Sexual activity: Never

## 2023-05-28 NOTE — Telephone Encounter (Signed)
Ortho bundle call completed. 

## 2023-05-28 NOTE — Therapy (Signed)
OUTPATIENT PHYSICAL THERAPY TREATMENT   Patient Name: Faith Reeves MRN: [id number] DOB:04-Jan-1951, 72 y.o., female Today's Date: 05/28/2023  END OF SESSION:  PT End of Session - 05/28/23 1423     Visit Number 6    Number of Visits 16    Date for PT Re-Evaluation 06/26/23    Authorization Type Medicare, [place]    Progress Note Due on Visit 10    PT Start Time 1423    PT Stop Time 1502    PT Time Calculation (min) 39 min    Activity Tolerance Patient tolerated treatment well    Behavior During Therapy WFL for tasks assessed/performed                  Past Medical History:  Diagnosis Date   Allergy    Anemia    Anxiety    Arthritis    Blood transfusion without reported diagnosis    Cataract    GERD (gastroesophageal reflux disease)    Hypertension    Hypertensive retinopathy    OU   Macular degeneration    Past Surgical History:  Procedure Laterality Date   ABDOMINAL HYSTERECTOMY     ACHILLES TENDON REPAIR Bilateral    APPENDECTOMY     BACK SURGERY     BICEPT TENODESIS Right 04/15/2023   Procedure: RIGHT BICEPS TENODESIS;  Surgeon: Cammy Copa, MD;  Location: [facility] OR;  Service: Orthopedics;  Laterality: Right;   BILATERAL CARPAL TUNNEL RELEASE     CHOLECYSTECTOMY     COLONOSCOPY     EYE SURGERY Right 09/18/2022   cataract extraction   HERNIA REPAIR     JOINT REPLACEMENT     MELANOMA EXCISION Right 09/15/2017   Procedure: EXCISION LIPOSARCOMA RIGHT ARM;  Surgeon: Violeta Gelinas, MD;  Location: [facility] OR;  Service: General;  Laterality: Right;   REVERSE SHOULDER ARTHROPLASTY Right 04/15/2023   Procedure: RIGHT REVERSE SHOULDER ARTHROPLASTY;  Surgeon: Cammy Copa, MD;  Location: [facility] OR;  Service: Orthopedics;  Laterality: Right;   SPINE SURGERY     Patient Active Problem List   Diagnosis Date Noted   Arthritis of right shoulder region 05/04/2023   Biceps tendonitis on right 05/04/2023   S/P reverse total shoulder arthroplasty, right 04/15/2023    Macular degeneration of right eye 12/18/2016   Allergy    Anxiety    Blood transfusion without reported diagnosis    Cataract    Hypertension     PCP: Donita Brooks, MD  REFERRING PROVIDER: Cammy Copa, MD  REFERRING DIAG: [phone] (ICD-10-CM) - S/P reverse total shoulder arthroplasty, right  Rationale for Evaluation and Treatment: Rehabilitation  THERAPY DIAG:  Chronic right shoulder pain  Muscle weakness (generalized)  Stiffness of right shoulder, not elsewhere classified  Cervicalgia  ONSET DATE: DOS: 04/15/23   SUBJECTIVE:  SUBJECTIVE STATEMENT: MD pleased with progress but wants her to get pulleys to work on flexion.   PERTINENT HISTORY:  anx, HTN, spine surgery, achillies tendon repair  PAIN:  Are you having pain?  Yes: NPRS scale: 1/10 currently Pain location: Rt shoulder Pain description: aching, "teasing because I can't do what I need to do"  Aggravating factors: any movement Relieving factors: icing  PRECAUTIONS:  Shoulder and Other: limit ER  WEIGHT BEARING RESTRICTIONS:  No  FALLS:  Has patient fallen in last 6 months? No  LIVING ENVIRONMENT: Lives with: lives with their spouse Lives in: House/apartment  OCCUPATION:  Retired: Product/process development scientist  PLOF:  Independent  PATIENT GOALS:  Improve pain, dress herself and toilet herself   OBJECTIVE:   PATIENT SURVEYS:  05/01/23: FOTO 45 (predicted 61)  COGNITIVE STATUS: Within functional limits for tasks assessed   POSTURE:  rounded shoulders and forward head  HAND DOMINANCE:  Right  GAIT: 05/01/23 Comments: independent   Shoulder  PALPATION: Mild tenderness along incision; swelling present  UPPER EXTREMITY ROM:  ROM Right eval  Shoulder flexion P: 107  Shoulder extension    Shoulder abduction P:100  Shoulder adduction   Shoulder extension   Shoulder internal rotation   Shoulder external rotation P: 25   (Blank rows = not tested)   UPPER EXTREMITY MMT:  05/01/23: Deferred today due to post op status  MMT Right eval Left eval  Shoulder flexion    Shoulder extension    Shoulder abduction    Shoulder adduction    Shoulder extension    Shoulder internal rotation    Shoulder external rotation    Middle trapezius    Lower trapezius    Elbow flexion    Elbow extension    Wrist flexion    Wrist extension    Wrist ulnar deviation    Wrist radial deviation    Wrist pronation    Wrist supination    Grip strength     (Blank rows = not tested)   TREATMENT:                                                                                                                              DATE:  05/28/23 TherEx Pulleys x 3 min flexion and scaption AA shoulder flexion with towel on wall x 10 reps Rows L4 band 2x10 Seated AA flexion 2x10; 2# bar Seated AA scaption 2x10; 2# bar Supine shoulder flexion 2x10 with 1# Supine shoulder circles in 90 deg flexion with 1# x20 reps CW/CCW Sidelying shoulder abduction 2x10 with 1# Sidelying ER 2x10; no weight UBE L2 x 6 min   05/26/23 TherEx Pulleys x 3 min flexion and scaption Seated AA flexion 2x10; 2# bar Seated AA scaption 2x10; 2# bar Wall ladder flexion and scaption x10 reps each Rows L3 band 2x10 Shoulder ext L3 band 2x10 Supine active flexion with elbow flexed 2x10   05/22/23 TherEx PROM shoulder flexion, ABD, ER (  no more than 30*) as appropriate  AAROM into flexion 90* x10 AAROM into scaption 90*  only x10 Shoulder flexion, ABD x10 each on the wall Very gentle ER isometrics (20% effort) starting with UE in internally rotated position on stomach x10  Encouraged following surgical/UE use precautions on her own at home, allowing correct healing time rather than pushing or progressing too fast       05/19/2023 Therapeutic Exercise:  PT used shoulder model for anatomy including why scapula position / posture is important. Seated with RUE supported on yoga block: scapula retraction 3 sec hold 15 reps 2 sets. Tactile & verbal cues to prevent elevation. Palpation with tenderness Levator Scapulae and biceps tendon (distal scar) Isometric with towel at door frame: abduction / deltoid 3 sec hold 10 reps 2 sets Isometric with towel at door frame: flexion 3 sec hold 10 reps 2 sets Leaning over low bed so Rt hand directly under shoulder sliding into flexion with no weight on arm (LUE supporting) 10 reps  Manual Therapy: PROM for shoulder flexion, abduction and ER to 30* Soft tissue technique to biceps tendon, scar mobs and Levator Scapulae.    PATIENT EDUCATION:  Education details: HEP Person educated: Patient and Spouse Education method: Explanation, Facilities manager, and Handouts Education comprehension: verbalized understanding, returned demonstration, and needs further education  HOME EXERCISE PROGRAM: Access Code: [email or web address] URL: [email or web address] Date: 05/28/2023 Prepared by: Moshe Cipro  Exercises - Shoulder Flexion Wall Slide with Towel  - 1 x daily - 7 x weekly - 1-2 sets - 10 reps - Standing Shoulder Abduction AAROM with Dowel  - 1 x daily - 7 x weekly - 2 sets - 10 reps - Standing Shoulder Flexion AAROM with Dowel  - 1 x daily - 7 x weekly - 2 sets - 10 reps - Standing Shoulder Row with Anchored Resistance  - 1 x daily - 7 x weekly - 2 sets - 10 reps - Supine Shoulder Flexion with Free Weight  - 1 x daily - 7 x weekly - 2 sets - 10 reps - 3-5 sec hold - Sidelying Shoulder Abduction Full Range of Motion with Dumbbell  - 1 x daily - 7 x weekly - 2 sets - 10 reps - 3-5 sec hold - Sidelying Shoulder External Rotation  - 1 x daily - 7 x weekly - 2 sets - 10 reps   ASSESSMENT:  CLINICAL IMPRESSION:  Progressed HEP today to include active ROM and  strengthening exercises.  Plan to continue PT 1x/wk for 3-4 additional weeks to maximize function.   OBJECTIVE IMPAIRMENTS: decreased ROM, decreased strength, hypomobility, increased edema, increased fascial restrictions, impaired UE functional use, postural dysfunction, and pain.   ACTIVITY LIMITATIONS: carrying, lifting, sleeping, toileting, dressing, hygiene/grooming, and locomotion level  PARTICIPATION LIMITATIONS: meal prep, cleaning, laundry, driving, shopping, and community activity  PERSONAL FACTORS: 3+ comorbidities: anx, HTN, spine surgery, achillies tendon repair  are also affecting patient's functional outcome.   REHAB POTENTIAL: Good  CLINICAL DECISION MAKING: Evolving/moderate complexity  EVALUATION COMPLEXITY: Moderate   GOALS: Goals reviewed with patient? Yes  SHORT TERM GOALS: Target date: 05/29/2023   Independent with initial HEP Goal status: Ongoing 05/13/2023  2.  Rt shoulder PROM improved 15 deg all directions for improved function Goal status:  Ongoing 05/13/2023   LONG TERM GOALS: Target date: 06/26/2023  Independent with final HEP Goal status: Ongoing 05/13/2023  2.  FOTO score improved to 61 Goal status: Ongoing 05/13/2023  3.  Rt shoulder flexion and abduction  AROM at least 120 deg for improved ADLs and independence Goal status: Ongoing 05/13/2023  4.  Report pain < 3/10 with reaching activities for improved function Goal status: Ongoing 05/13/2023  5.  Demonstrate at least 4/5 Rt shoulder flexion and abduction strength for improved function Goal status: Ongoing 05/13/2023   PLAN:  PT FREQUENCY: 2x/week  PT DURATION: 8 weeks  PLANNED INTERVENTIONS: Therapeutic exercises, Therapeutic activity, Neuromuscular re-education, Patient/Family education, Self Care, Joint mobilization, Aquatic Therapy, Dry Needling, Electrical stimulation, Cryotherapy, Moist heat, Taping, Vasopneumatic device, Ultrasound, Ionotophoresis 4mg /ml Dexamethasone, Manual therapy, and  Re-evaluation.  PLAN FOR NEXT SESSION: review updated HEP, and continue to work on strengthening and maximize ROM   NEXT MD VISIT: PRN    Clarita Crane, PT, DPT 05/28/23 3:02 PM

## 2023-06-02 ENCOUNTER — Telehealth: Payer: Self-pay | Admitting: Family Medicine

## 2023-06-02 MED ORDER — PANTOPRAZOLE SODIUM 40 MG PO TBEC: 40.0000 mg | DELAYED_RELEASE_TABLET | Freq: Every day | ORAL | 0 refills | Status: DC

## 2023-06-02 NOTE — Addendum Note (Signed)
Addended by: Arta Silence on: 06/02/2023 12:59 PM   Modules accepted: Orders

## 2023-06-02 NOTE — Telephone Encounter (Signed)
Prescription Request  06/02/2023  LOV: 05/16/2023  What is the name of the medication or equipment?   pantoprazole (PROTONIX) 40 MG tablet [[id number]]   Have you contacted your pharmacy to request a refill? Yes   Which pharmacy would you like this sent to?  [id number] [place], [place] - [address] [place] [address] Phone: [phone] Fax: [phone]    Patient notified that their request is being sent to the clinical staff for review and that they should receive a response within 2 business days.   Please advise pharmacist.

## 2023-06-04 ENCOUNTER — Encounter: Payer: Self-pay | Admitting: Physical Therapy

## 2023-06-04 ENCOUNTER — Ambulatory Visit (INDEPENDENT_AMBULATORY_CARE_PROVIDER_SITE_OTHER): Payer: Medicare Other | Admitting: Physical Therapy

## 2023-06-04 DIAGNOSIS — M25511 Pain in right shoulder: Secondary | ICD-10-CM

## 2023-06-04 DIAGNOSIS — G8929 Other chronic pain: Secondary | ICD-10-CM | POA: Diagnosis not present

## 2023-06-04 DIAGNOSIS — M6281 Muscle weakness (generalized): Secondary | ICD-10-CM | POA: Diagnosis not present

## 2023-06-04 DIAGNOSIS — M25611 Stiffness of right shoulder, not elsewhere classified: Secondary | ICD-10-CM | POA: Diagnosis not present

## 2023-06-04 NOTE — Therapy (Signed)
OUTPATIENT PHYSICAL THERAPY TREATMENT   Patient Name: Faith Reeves MRN: [id number] DOB:1951-02-22, 72 y.o., female Today's Date: 06/04/2023  END OF SESSION:  PT End of Session - 06/04/23 1258     Visit Number 7    Number of Visits 16    Date for PT Re-Evaluation 06/26/23    Authorization Type Medicare, [place]    Progress Note Due on Visit 10    PT Start Time 1258    PT Stop Time 1344    PT Time Calculation (min) 46 min    Activity Tolerance Patient tolerated treatment well    Behavior During Therapy WFL for tasks assessed/performed                   Past Medical History:  Diagnosis Date   Allergy    Anemia    Anxiety    Arthritis    Blood transfusion without reported diagnosis    Cataract    GERD (gastroesophageal reflux disease)    Hypertension    Hypertensive retinopathy    OU   Macular degeneration    Past Surgical History:  Procedure Laterality Date   ABDOMINAL HYSTERECTOMY     ACHILLES TENDON REPAIR Bilateral    APPENDECTOMY     BACK SURGERY     BICEPT TENODESIS Right 04/15/2023   Procedure: RIGHT BICEPS TENODESIS;  Surgeon: Cammy Copa, MD;  Location: [facility] OR;  Service: Orthopedics;  Laterality: Right;   BILATERAL CARPAL TUNNEL RELEASE     CHOLECYSTECTOMY     COLONOSCOPY     EYE SURGERY Right 09/18/2022   cataract extraction   HERNIA REPAIR     JOINT REPLACEMENT     MELANOMA EXCISION Right 09/15/2017   Procedure: EXCISION LIPOSARCOMA RIGHT ARM;  Surgeon: Violeta Gelinas, MD;  Location: [facility] OR;  Service: General;  Laterality: Right;   REVERSE SHOULDER ARTHROPLASTY Right 04/15/2023   Procedure: RIGHT REVERSE SHOULDER ARTHROPLASTY;  Surgeon: Cammy Copa, MD;  Location: [facility] OR;  Service: Orthopedics;  Laterality: Right;   SPINE SURGERY     Patient Active Problem List   Diagnosis Date Noted   Arthritis of right shoulder region 05/04/2023   Biceps tendonitis on right 05/04/2023   S/P reverse total shoulder arthroplasty, right  04/15/2023   Macular degeneration of right eye 12/18/2016   Allergy    Anxiety    Blood transfusion without reported diagnosis    Cataract    Hypertension     PCP: Donita Brooks, MD  REFERRING PROVIDER: Cammy Copa, MD  REFERRING DIAG: [phone] (ICD-10-CM) - S/P reverse total shoulder arthroplasty, right  Rationale for Evaluation and Treatment: Rehabilitation  THERAPY DIAG:  Chronic right shoulder pain  Muscle weakness (generalized)  Stiffness of right shoulder, not elsewhere classified  ONSET DATE: DOS: 04/15/23   SUBJECTIVE:  SUBJECTIVE STATEMENT: She purchased pulleys and did them standing up.   PERTINENT HISTORY:  anx, HTN, spine surgery, achillies tendon repair  PAIN:  Are you having pain?  Yes: NPRS scale:  0/10 at rest and 1/10 exercising more stretch than hurt. Pain location: Rt shoulder Pain description: aching, "teasing because I can't do what I need to do"  Aggravating factors: any movement Relieving factors: icing  PRECAUTIONS:  Shoulder and Other: limit ER  WEIGHT BEARING RESTRICTIONS:  No  FALLS:  Has patient fallen in last 6 months? No  LIVING ENVIRONMENT: Lives with: lives with their spouse Lives in: House/apartment  OCCUPATION:  Retired: Product/process development scientist  PLOF:  Independent  PATIENT GOALS:  Improve pain, dress herself and toilet herself   OBJECTIVE:   PATIENT SURVEYS:  05/01/23: FOTO 45 (predicted 61)  COGNITIVE STATUS: Within functional limits for tasks assessed   POSTURE:  rounded shoulders and forward head  HAND DOMINANCE:  Right  GAIT: 05/01/23 Comments: independent   Shoulder  PALPATION: Mild tenderness along incision; swelling present  UPPER EXTREMITY ROM:  ROM Right eval Right 06/04/23  Shoulder flexion P: 107 P:  115* Standing A:  80* AA: 90*  Shoulder extension    Shoulder abduction P:100   Shoulder adduction    Shoulder extension    Shoulder internal rotation    Shoulder external rotation P: 25    (Blank rows = not tested)   UPPER EXTREMITY MMT:  05/01/23: Deferred today due to post op status  MMT Right eval Left eval  Shoulder flexion    Shoulder extension    Shoulder abduction    Shoulder adduction    Shoulder extension    Shoulder internal rotation    Shoulder external rotation    Middle trapezius    Lower trapezius    Elbow flexion    Elbow extension    Wrist flexion    Wrist extension    Wrist ulnar deviation    Wrist radial deviation    Wrist pronation    Wrist supination    Grip strength     (Blank rows = not tested)   TREATMENT:                                                                                                                              DATE:  06/04/2023: TherEx Pulleys x 3 min flexion and scaption; PT instructed in performing seated and set up.  PT recommended watching right hand with motion to facilitate proper trunk / upper body motions.  AA shoulder flexion with towel on mirror x 10 reps with cues to watch hand during motion. Both hand in pillow case moving BUEs in arc on mirror 10 reps PT used Medbridge to review updated HEP with verbal, demo & tactile cues.  Standing dowel (using her cane) abduction with cues on right hand orientation - 10 reps Standing dowel flexion 10 reps Supine shoulder flexion 1# 10 reps - PT  had to correct motion from chest press Sidelying shoulder abduction 1# 10 reps with cues for proper motion Sidelying shoulder external rotation 1# 10 reps with cues for proper motion Standing rows red Theraband 10 reps with cues for proper motion Pt verbalized better understanding after review.  Standing at counter sliding pillow case back/forth shoulder flex/ext 1 min. Pt improved ability to ext arm / elbow which was her  concern. Moving water counter to shoulder height shelf 10 reps. Pt questioning ability to stack coffee cups. PT demo & verbal cues on RUE only moving cup counter to shelf, then LUE assist distal to elbow to lift 4" higher to stack. Pt performed 10 reps. PT demo & verbal cues on BUEs placing plate on shoulder height shelf then using BUEs to lift plate off shelf as high as possible. Pt verbalized understanding.    05/28/23 TherEx Pulleys x 3 min flexion and scaption AA shoulder flexion with towel on wall x 10 reps Rows L4 band 2x10 Seated AA flexion 2x10; 2# bar Seated AA scaption 2x10; 2# bar Supine shoulder flexion 2x10 with 1# Supine shoulder circles in 90 deg flexion with 1# x20 reps CW/CCW Sidelying shoulder abduction 2x10 with 1# Sidelying ER 2x10; no weight UBE L2 x 6 min   05/26/23 TherEx Pulleys x 3 min flexion and scaption Seated AA flexion 2x10; 2# bar Seated AA scaption 2x10; 2# bar Wall ladder flexion and scaption x10 reps each Rows L3 band 2x10 Shoulder ext L3 band 2x10 Supine active flexion with elbow flexed 2x10   PATIENT EDUCATION:  Education details: HEP Person educated: Patient and Spouse Education method: Explanation, Facilities manager, and Handouts Education comprehension: verbalized understanding, returned demonstration, and needs further education  HOME EXERCISE PROGRAM: Access Code: [email or web address] URL: [email or web address] Date: 05/28/2023 Prepared by: Moshe Cipro  Exercises - Shoulder Flexion Wall Slide with Towel  - 1 x daily - 7 x weekly - 1-2 sets - 10 reps - Standing Shoulder Abduction AAROM with Dowel  - 1 x daily - 7 x weekly - 2 sets - 10 reps - Standing Shoulder Flexion AAROM with Dowel  - 1 x daily - 7 x weekly - 2 sets - 10 reps - Standing Shoulder Row with Anchored Resistance  - 1 x daily - 7 x weekly - 2 sets - 10 reps - Supine Shoulder Flexion with Free Weight  - 1 x daily - 7 x weekly - 2 sets - 10 reps - 3-5 sec hold -  Sidelying Shoulder Abduction Full Range of Motion with Dumbbell  - 1 x daily - 7 x weekly - 2 sets - 10 reps - 3-5 sec hold - Sidelying Shoulder External Rotation  - 1 x daily - 7 x weekly - 2 sets - 10 reps   ASSESSMENT:  CLINICAL IMPRESSION:  Patient appears to have better understanding of HEP. She also appears to understand activities in kitchen to functionally work on arm strength.    OBJECTIVE IMPAIRMENTS: decreased ROM, decreased strength, hypomobility, increased edema, increased fascial restrictions, impaired UE functional use, postural dysfunction, and pain.   ACTIVITY LIMITATIONS: carrying, lifting, sleeping, toileting, dressing, hygiene/grooming, and locomotion level  PARTICIPATION LIMITATIONS: meal prep, cleaning, laundry, driving, shopping, and community activity  PERSONAL FACTORS: 3+ comorbidities: anx, HTN, spine surgery, achillies tendon repair  are also affecting patient's functional outcome.   REHAB POTENTIAL: Good  CLINICAL DECISION MAKING: Evolving/moderate complexity  EVALUATION COMPLEXITY: Moderate   GOALS: Goals reviewed with patient? Yes  SHORT TERM GOALS: Target date: 05/29/2023  Independent with initial HEP Goal status: MET 06/04/2023  2.  Rt shoulder PROM improved 15 deg all directions for improved function Goal status:  MET 06/04/2023   LONG TERM GOALS: Target date: 06/26/2023  Independent with final HEP Goal status: Ongoing 06/04/2023  2.  FOTO score improved to 61 Goal status: Ongoing 06/04/2023  3.  Rt shoulder flexion and abduction AROM at least 120 deg for improved ADLs and independence Goal status: Ongoing 06/04/2023  4.  Report pain < 3/10 with reaching activities for improved function Goal status: Ongoing 06/04/2023  5.  Demonstrate at least 4/5 Rt shoulder flexion and abduction strength for improved function Goal status: Ongoing 06/04/2023   PLAN:  PT FREQUENCY: 2x/week  PT DURATION: 8 weeks  PLANNED INTERVENTIONS: Therapeutic  exercises, Therapeutic activity, Neuromuscular re-education, Patient/Family education, Self Care, Joint mobilization, Aquatic Therapy, Dry Needling, Electrical stimulation, Cryotherapy, Moist heat, Taping, Vasopneumatic device, Ultrasound, Ionotophoresis 4mg /ml Dexamethasone, Manual therapy, and Re-evaluation.  PLAN FOR NEXT SESSION: check updated HEP, and work on functional kitchen tasks & other ADLs pt reporting issues.   NEXT MD VISIT: PRN   Vladimir Faster, PT, DPT 06/04/2023, 3:38 PM

## 2023-06-09 ENCOUNTER — Other Ambulatory Visit: Payer: Self-pay | Admitting: Family Medicine

## 2023-06-09 ENCOUNTER — Telehealth: Payer: Self-pay

## 2023-06-09 MED ORDER — OXYCODONE HCL 5 MG PO TABS: 5.0000 mg | ORAL_TABLET | ORAL | 0 refills | Status: DC | PRN

## 2023-06-09 NOTE — Telephone Encounter (Signed)
Prescription Request  06/09/2023  LOV: 05/16/23  What is the name of the medication or equipment? oxyCODONE (OXY IR/ROXICODONE) 5 MG immediate release tablet  Have you contacted your pharmacy to request a refill? No   Which pharmacy would you like this sent to?  [id number] [place], [place] - [address] [place] [address] Phone: [phone] Fax: [phone]    Patient notified that their request is being sent to the clinical staff for review and that they should receive a response within 2 business days.   Please advise at [facility] [phone]

## 2023-06-10 ENCOUNTER — Other Ambulatory Visit: Payer: Self-pay | Admitting: Family Medicine

## 2023-06-10 MED ORDER — OXYCODONE HCL 5 MG PO TABS: 5.0000 mg | ORAL_TABLET | ORAL | 0 refills | Status: DC | PRN

## 2023-06-11 ENCOUNTER — Encounter: Payer: Self-pay | Admitting: Physical Therapy

## 2023-06-11 ENCOUNTER — Ambulatory Visit (INDEPENDENT_AMBULATORY_CARE_PROVIDER_SITE_OTHER): Payer: Medicare Other | Admitting: Physical Therapy

## 2023-06-11 DIAGNOSIS — G8929 Other chronic pain: Secondary | ICD-10-CM | POA: Diagnosis not present

## 2023-06-11 DIAGNOSIS — M25611 Stiffness of right shoulder, not elsewhere classified: Secondary | ICD-10-CM | POA: Diagnosis not present

## 2023-06-11 DIAGNOSIS — M25511 Pain in right shoulder: Secondary | ICD-10-CM | POA: Diagnosis not present

## 2023-06-11 DIAGNOSIS — M6281 Muscle weakness (generalized): Secondary | ICD-10-CM

## 2023-06-11 NOTE — Therapy (Signed)
OUTPATIENT PHYSICAL THERAPY TREATMENT   Patient Name: Faith Reeves MRN: [id number] DOB:09-Oct-1951, 72 y.o., female Today's Date: 06/11/2023  END OF SESSION:  PT End of Session - 06/11/23 1258     Visit Number 8    Number of Visits 16    Date for PT Re-Evaluation 06/26/23    Authorization Type Medicare, [place]    Progress Note Due on Visit 10    PT Start Time 1258    PT Stop Time 1341    PT Time Calculation (min) 43 min    Activity Tolerance Patient tolerated treatment well    Behavior During Therapy WFL for tasks assessed/performed                    Past Medical History:  Diagnosis Date   Allergy    Anemia    Anxiety    Arthritis    Blood transfusion without reported diagnosis    Cataract    GERD (gastroesophageal reflux disease)    Hypertension    Hypertensive retinopathy    OU   Macular degeneration    Past Surgical History:  Procedure Laterality Date   ABDOMINAL HYSTERECTOMY     ACHILLES TENDON REPAIR Bilateral    APPENDECTOMY     BACK SURGERY     BICEPT TENODESIS Right 04/15/2023   Procedure: RIGHT BICEPS TENODESIS;  Surgeon: Cammy Copa, MD;  Location: [facility] OR;  Service: Orthopedics;  Laterality: Right;   BILATERAL CARPAL TUNNEL RELEASE     CHOLECYSTECTOMY     COLONOSCOPY     EYE SURGERY Right 09/18/2022   cataract extraction   HERNIA REPAIR     JOINT REPLACEMENT     MELANOMA EXCISION Right 09/15/2017   Procedure: EXCISION LIPOSARCOMA RIGHT ARM;  Surgeon: Violeta Gelinas, MD;  Location: [facility] OR;  Service: General;  Laterality: Right;   REVERSE SHOULDER ARTHROPLASTY Right 04/15/2023   Procedure: RIGHT REVERSE SHOULDER ARTHROPLASTY;  Surgeon: Cammy Copa, MD;  Location: [facility] OR;  Service: Orthopedics;  Laterality: Right;   SPINE SURGERY     Patient Active Problem List   Diagnosis Date Noted   Arthritis of right shoulder region 05/04/2023   Biceps tendonitis on right 05/04/2023   S/P reverse total shoulder arthroplasty, right  04/15/2023   Macular degeneration of right eye 12/18/2016   Allergy    Anxiety    Blood transfusion without reported diagnosis    Cataract    Hypertension     PCP: Donita Brooks, MD  REFERRING PROVIDER: Cammy Copa, MD  REFERRING DIAG: [phone] (ICD-10-CM) - S/P reverse total shoulder arthroplasty, right  Rationale for Evaluation and Treatment: Rehabilitation  THERAPY DIAG:  Chronic right shoulder pain  Muscle weakness (generalized)  Stiffness of right shoulder, not elsewhere classified  ONSET DATE: DOS: 04/15/23   SUBJECTIVE:  SUBJECTIVE STATEMENT: She has been doing the pulley seated without issues.  She is having difficulty with cane abduction. She can row of cups on lower shelf. She can not lift pot of water from sink to stove.    PERTINENT HISTORY:  anx, HTN, spine surgery, achillies tendon repair  PAIN:  Are you having pain?  Yes: NPRS scale:  0/10 at rest and 1/10 exercising more stretch than hurt. Pain location: Rt shoulder Pain description: aching, "teasing because I can't do what I need to do"  Aggravating factors: any movement Relieving factors: icing  PRECAUTIONS:  Shoulder and Other: limit ER  WEIGHT BEARING RESTRICTIONS:  No  FALLS:  Has patient fallen in last 6 months? No  LIVING ENVIRONMENT: Lives with: lives with their spouse Lives in: House/apartment  OCCUPATION:  Retired: Product/process development scientist  PLOF:  Independent  PATIENT GOALS:  Improve pain, dress herself and toilet herself   OBJECTIVE:   PATIENT SURVEYS:  05/01/23: FOTO 45 (predicted 61)  COGNITIVE STATUS: Within functional limits for tasks assessed   POSTURE:  rounded shoulders and forward head  HAND DOMINANCE:  Right  GAIT: 05/01/23 Comments:  independent   Shoulder  PALPATION: Mild tenderness along incision; swelling present  UPPER EXTREMITY ROM:  ROM Right eval Right 06/04/23  Shoulder flexion P: 107 P: 115* Standing A:  80* AA: 90*  Shoulder extension    Shoulder abduction P:100   Shoulder adduction    Shoulder extension    Shoulder internal rotation    Shoulder external rotation P: 25    (Blank rows = not tested)   UPPER EXTREMITY MMT:  05/01/23: Deferred today due to post op status  MMT Right eval Left eval  Shoulder flexion    Shoulder extension    Shoulder abduction    Shoulder adduction    Shoulder extension    Shoulder internal rotation    Shoulder external rotation    Middle trapezius    Lower trapezius    Elbow flexion    Elbow extension    Wrist flexion    Wrist extension    Wrist ulnar deviation    Wrist radial deviation    Wrist pronation    Wrist supination    Grip strength     (Blank rows = not tested)   TREATMENT:                                                                                                                              DATE:  06/11/2023: TherEx Pulleys x 3 min flexion and scaption; PT instructed in performing seated and set up.  PT recommended watching right hand with motion to facilitate proper trunk / upper body motions.  Pt verbalized understanding with other HEP exercises.   Self-care: PT demo & verbal cues on moving pots using towel to slide along counter & moving between counters 90* & 180* with stepping feet; reaching to lower items like oven. Pt return demo  with initial correct cues.   PT demo & verbal cues on vacuuming, mopping, etc and placing heavier items onto counter  or in overhead microwave with offset feet and weight shift decreasing amount of shoulder force / strength needed.  Pt verbalized & return demo understanding.  PT demo & verbal cues on using towel to wash her back and clean bw her legs.  Pt verbalized & return demo  understanding.  06/04/2023: TherEx Pulleys x 3 min flexion and scaption; PT instructed in performing seated and set up.  PT recommended watching right hand with motion to facilitate proper trunk / upper body motions.  AA shoulder flexion with towel on mirror x 10 reps with cues to watch hand during motion. Both hand in pillow case moving BUEs in arc on mirror 10 reps PT used Medbridge to review updated HEP with verbal, demo & tactile cues.  Standing dowel (using her cane) abduction with cues on right hand orientation - 10 reps Standing dowel flexion 10 reps Supine shoulder flexion 1# 10 reps - PT had to correct motion from chest press Sidelying shoulder abduction 1# 10 reps with cues for proper motion Sidelying shoulder external rotation 1# 10 reps with cues for proper motion Standing rows red Theraband 10 reps with cues for proper motion Pt verbalized better understanding after review.  Standing at counter sliding pillow case back/forth shoulder flex/ext 1 min. Pt improved ability to ext arm / elbow which was her concern. Moving water counter to shoulder height shelf 10 reps. Pt questioning ability to stack coffee cups. PT demo & verbal cues on RUE only moving cup counter to shelf, then LUE assist distal to elbow to lift 4" higher to stack. Pt performed 10 reps. PT demo & verbal cues on BUEs placing plate on shoulder height shelf then using BUEs to lift plate off shelf as high as possible. Pt verbalized understanding.    05/28/23 TherEx Pulleys x 3 min flexion and scaption AA shoulder flexion with towel on wall x 10 reps Rows L4 band 2x10 Seated AA flexion 2x10; 2# bar Seated AA scaption 2x10; 2# bar Supine shoulder flexion 2x10 with 1# Supine shoulder circles in 90 deg flexion with 1# x20 reps CW/CCW Sidelying shoulder abduction 2x10 with 1# Sidelying ER 2x10; no weight UBE L2 x 6 min   PATIENT EDUCATION:  Education details: HEP Person educated: Patient and Spouse Education  method: Explanation, Facilities manager, and Handouts Education comprehension: verbalized understanding, returned demonstration, and needs further education  HOME EXERCISE PROGRAM: Access Code: [email or web address] URL: [email or web address] Date: 05/28/2023 Prepared by: Moshe Cipro  Exercises - Shoulder Flexion Wall Slide with Towel  - 1 x daily - 7 x weekly - 1-2 sets - 10 reps - Standing Shoulder Abduction AAROM with Dowel  - 1 x daily - 7 x weekly - 2 sets - 10 reps - Standing Shoulder Flexion AAROM with Dowel  - 1 x daily - 7 x weekly - 2 sets - 10 reps - Standing Shoulder Row with Anchored Resistance  - 1 x daily - 7 x weekly - 2 sets - 10 reps - Supine Shoulder Flexion with Free Weight  - 1 x daily - 7 x weekly - 2 sets - 10 reps - 3-5 sec hold - Sidelying Shoulder Abduction Full Range of Motion with Dumbbell  - 1 x daily - 7 x weekly - 2 sets - 10 reps - 3-5 sec hold - Sidelying Shoulder External Rotation  - 1 x daily - 7 x weekly -  2 sets - 10 reps   ASSESSMENT:  CLINICAL IMPRESSION:  Patient appears to understand ADL / self-care activities that PT instructed today. She is happy with increased ability in kitchen but feels PT directions will help.     OBJECTIVE IMPAIRMENTS: decreased ROM, decreased strength, hypomobility, increased edema, increased fascial restrictions, impaired UE functional use, postural dysfunction, and pain.   ACTIVITY LIMITATIONS: carrying, lifting, sleeping, toileting, dressing, hygiene/grooming, and locomotion level  PARTICIPATION LIMITATIONS: meal prep, cleaning, laundry, driving, shopping, and community activity  PERSONAL FACTORS: 3+ comorbidities: anx, HTN, spine surgery, achillies tendon repair  are also affecting patient's functional outcome.   REHAB POTENTIAL: Good  CLINICAL DECISION MAKING: Evolving/moderate complexity  EVALUATION COMPLEXITY: Moderate   GOALS: Goals reviewed with patient? Yes  SHORT TERM GOALS: Target date:  05/29/2023   Independent with initial HEP Goal status: MET 06/04/2023  2.  Rt shoulder PROM improved 15 deg all directions for improved function Goal status:  MET 06/04/2023   LONG TERM GOALS: Target date: 06/26/2023  Independent with final HEP Goal status: Ongoing 06/04/2023  2.  FOTO score improved to 61 Goal status: Ongoing 06/04/2023  3.  Rt shoulder flexion and abduction AROM at least 120 deg for improved ADLs and independence Goal status: Ongoing 06/04/2023  4.  Report pain < 3/10 with reaching activities for improved function Goal status: Ongoing 06/04/2023  5.  Demonstrate at least 4/5 Rt shoulder flexion and abduction strength for improved function Goal status: Ongoing 06/04/2023   PLAN:  PT FREQUENCY: 2x/week  PT DURATION: 8 weeks  PLANNED INTERVENTIONS: Therapeutic exercises, Therapeutic activity, Neuromuscular re-education, Patient/Family education, Self Care, Joint mobilization, Aquatic Therapy, Dry Needling, Electrical stimulation, Cryotherapy, Moist heat, Taping, Vasopneumatic device, Ultrasound, Ionotophoresis 4mg /ml Dexamethasone, Manual therapy, and Re-evaluation.  PLAN FOR NEXT SESSION: check LTGs and discuss d/c vs continuing 1x/wk   NEXT MD VISIT: PRN   Vladimir Faster, PT, DPT 06/11/2023, 2:03 PM

## 2023-06-12 ENCOUNTER — Other Ambulatory Visit: Payer: Self-pay | Admitting: Family Medicine

## 2023-06-18 ENCOUNTER — Ambulatory Visit (INDEPENDENT_AMBULATORY_CARE_PROVIDER_SITE_OTHER): Payer: Medicare Other | Admitting: Physical Therapy

## 2023-06-18 ENCOUNTER — Encounter: Payer: Self-pay | Admitting: Physical Therapy

## 2023-06-18 DIAGNOSIS — G8929 Other chronic pain: Secondary | ICD-10-CM | POA: Diagnosis not present

## 2023-06-18 DIAGNOSIS — M25511 Pain in right shoulder: Secondary | ICD-10-CM

## 2023-06-18 DIAGNOSIS — M25611 Stiffness of right shoulder, not elsewhere classified: Secondary | ICD-10-CM

## 2023-06-18 DIAGNOSIS — M6281 Muscle weakness (generalized): Secondary | ICD-10-CM

## 2023-06-18 NOTE — Therapy (Signed)
OUTPATIENT PHYSICAL THERAPY TREATMENT DISCHARGE SUMMARY   Patient Name: Faith Reeves MRN: [id number] DOB:03/20/1951, 72 y.o., female Today's Date: 06/18/2023  END OF SESSION:  PT End of Session - 06/18/23 1257     Visit Number 9    Number of Visits 16    Date for PT Re-Evaluation 06/26/23    Authorization Type Medicare, [place]    Progress Note Due on Visit 10    PT Start Time 1300    PT Stop Time 1333    PT Time Calculation (min) 33 min    Activity Tolerance Patient tolerated treatment well    Behavior During Therapy WFL for tasks assessed/performed                     Past Medical History:  Diagnosis Date   Allergy    Anemia    Anxiety    Arthritis    Blood transfusion without reported diagnosis    Cataract    GERD (gastroesophageal reflux disease)    Hypertension    Hypertensive retinopathy    OU   Macular degeneration    Past Surgical History:  Procedure Laterality Date   ABDOMINAL HYSTERECTOMY     ACHILLES TENDON REPAIR Bilateral    APPENDECTOMY     BACK SURGERY     BICEPT TENODESIS Right 04/15/2023   Procedure: RIGHT BICEPS TENODESIS;  Surgeon: Cammy Copa, MD;  Location: [facility] OR;  Service: Orthopedics;  Laterality: Right;   BILATERAL CARPAL TUNNEL RELEASE     CHOLECYSTECTOMY     COLONOSCOPY     EYE SURGERY Right 09/18/2022   cataract extraction   HERNIA REPAIR     JOINT REPLACEMENT     MELANOMA EXCISION Right 09/15/2017   Procedure: EXCISION LIPOSARCOMA RIGHT ARM;  Surgeon: Violeta Gelinas, MD;  Location: [facility] OR;  Service: General;  Laterality: Right;   REVERSE SHOULDER ARTHROPLASTY Right 04/15/2023   Procedure: RIGHT REVERSE SHOULDER ARTHROPLASTY;  Surgeon: Cammy Copa, MD;  Location: [facility] OR;  Service: Orthopedics;  Laterality: Right;   SPINE SURGERY     Patient Active Problem List   Diagnosis Date Noted   Arthritis of right shoulder region 05/04/2023   Biceps tendonitis on right 05/04/2023   S/P reverse total shoulder  arthroplasty, right 04/15/2023   Macular degeneration of right eye 12/18/2016   Allergy    Anxiety    Blood transfusion without reported diagnosis    Cataract    Hypertension     PCP: Donita Brooks, MD  REFERRING PROVIDER: Cammy Copa, MD  REFERRING DIAG: [phone] (ICD-10-CM) - S/P reverse total shoulder arthroplasty, right  Rationale for Evaluation and Treatment: Rehabilitation  THERAPY DIAG:  Chronic right shoulder pain  Muscle weakness (generalized)  Stiffness of right shoulder, not elsewhere classified  ONSET DATE: DOS: 04/15/23   SUBJECTIVE:  SUBJECTIVE STATEMENT: Ready to graduate today from PT.    PERTINENT HISTORY:  anx, HTN, spine surgery, achillies tendon repair  PAIN:  Are you having pain?  Yes: NPRS scale:  0/10 at rest and 1/10 exercising more stretch than hurt. Pain location: Rt shoulder Pain description: aching, "teasing because I can't do what I need to do"  Aggravating factors: any movement Relieving factors: icing  PRECAUTIONS:  Shoulder and Other: limit ER  WEIGHT BEARING RESTRICTIONS:  No  FALLS:  Has patient fallen in last 6 months? No  LIVING ENVIRONMENT: Lives with: lives with their spouse Lives in: House/apartment  OCCUPATION:  Retired: Product/process development scientist  PLOF:  Independent  PATIENT GOALS:  Improve pain, dress herself and toilet herself   OBJECTIVE:   PATIENT SURVEYS:  05/01/23: FOTO 45 (predicted 61) 06/18/23: 68  COGNITIVE STATUS: Within functional limits for tasks assessed   POSTURE:  rounded shoulders and forward head  HAND DOMINANCE:  Right  GAIT: 05/01/23 Comments: independent   Shoulder  PALPATION: Mild tenderness along incision; swelling present  UPPER EXTREMITY ROM:  ROM Right eval Right 06/04/23  Right 06/18/23  Shoulder flexion P: 107 P: 115* Standing A:  80* AA: 90* A: 143 (Sitting)  Shoulder extension     Shoulder abduction P:100  A: 115  Shoulder adduction     Shoulder extension     Shoulder internal rotation     Shoulder external rotation P: 25     (Blank rows = not tested)   UPPER EXTREMITY MMT:  05/01/23: Deferred today due to post op status  MMT Right 06/18/23  Shoulder flexion 3+/5  Shoulder abduction 3+/5   (Blank rows = not tested)   TREATMENT:                                                                                                                              DATE:  06/18/23 TherEx UBE L3 x 6 min MMT and ROM - see above for details Review/update of HEP -see below for details Shoulder flexion to 90 deg standing 2x10; 2-3# Shoulder abduction to 90 deg standing 2x10; 2# x 10, 1# x 10  06/11/2023: TherEx Pulleys x 3 min flexion and scaption; PT instructed in performing seated and set up.  PT recommended watching right hand with motion to facilitate proper trunk / upper body motions.  Pt verbalized understanding with other HEP exercises.   Self-care: PT demo & verbal cues on moving pots using towel to slide along counter & moving between counters 90* & 180* with stepping feet; reaching to lower items like oven. Pt return demo with initial correct cues.   PT demo & verbal cues on vacuuming, mopping, etc and placing heavier items onto counter  or in overhead microwave with offset feet and weight shift decreasing amount of shoulder force / strength needed.  Pt verbalized & return demo understanding.  PT demo & verbal cues on using towel to wash her back  and clean bw her legs.  Pt verbalized & return demo understanding.  06/04/2023: TherEx Pulleys x 3 min flexion and scaption; PT instructed in performing seated and set up.  PT recommended watching right hand with motion to facilitate proper trunk / upper body motions.  AA shoulder flexion with towel on  mirror x 10 reps with cues to watch hand during motion. Both hand in pillow case moving BUEs in arc on mirror 10 reps PT used Medbridge to review updated HEP with verbal, demo & tactile cues.  Standing dowel (using her cane) abduction with cues on right hand orientation - 10 reps Standing dowel flexion 10 reps Supine shoulder flexion 1# 10 reps - PT had to correct motion from chest press Sidelying shoulder abduction 1# 10 reps with cues for proper motion Sidelying shoulder external rotation 1# 10 reps with cues for proper motion Standing rows red Theraband 10 reps with cues for proper motion Pt verbalized better understanding after review.  Standing at counter sliding pillow case back/forth shoulder flex/ext 1 min. Pt improved ability to ext arm / elbow which was her concern. Moving water counter to shoulder height shelf 10 reps. Pt questioning ability to stack coffee cups. PT demo & verbal cues on RUE only moving cup counter to shelf, then LUE assist distal to elbow to lift 4" higher to stack. Pt performed 10 reps. PT demo & verbal cues on BUEs placing plate on shoulder height shelf then using BUEs to lift plate off shelf as high as possible. Pt verbalized understanding.    05/28/23 TherEx Pulleys x 3 min flexion and scaption AA shoulder flexion with towel on wall x 10 reps Rows L4 band 2x10 Seated AA flexion 2x10; 2# bar Seated AA scaption 2x10; 2# bar Supine shoulder flexion 2x10 with 1# Supine shoulder circles in 90 deg flexion with 1# x20 reps CW/CCW Sidelying shoulder abduction 2x10 with 1# Sidelying ER 2x10; no weight UBE L2 x 6 min   PATIENT EDUCATION:  Education details: HEP Person educated: Patient and Spouse Education method: Explanation, Facilities manager, and Handouts Education comprehension: verbalized understanding, returned demonstration, and needs further education  HOME EXERCISE PROGRAM: Access Code: [email or web address] URL: [email or web address] Date:  06/18/2023 Prepared by: Moshe Cipro  Exercises - Shoulder Flexion Wall Slide with Towel  - 1 x daily - 7 x weekly - 1-2 sets - 10 reps - Scaption Wall Slide with Towel  - 1 x daily - 7 x weekly - 1-2 sets - 10 reps - Standing Shoulder Abduction AAROM with Dowel  - 1 x daily - 7 x weekly - 2 sets - 10 reps - Standing Shoulder Row with Anchored Resistance  - 1 x daily - 7 x weekly - 2 sets - 10 reps - Sidelying Shoulder External Rotation  - 1 x daily - 7 x weekly - 2 sets - 10 reps - Single Arm Shoulder Flexion with Dumbbell (Mirrored)  - 1 x daily - 7 x weekly - 2-3 sets - 10 reps - Standing Single Arm Shoulder Abduction with Dumbbell - Palm Down (Mirrored)  - 1 x daily - 7 x weekly - 2-3 sets - 10 reps   ASSESSMENT:  CLINICAL IMPRESSION:  Pt has met/partially met all LTGs at this time and is ready for d/c from PT services.  She has HEP to address remaining deficits which are minimal at this time.  Will d/c PT today.   OBJECTIVE IMPAIRMENTS: decreased ROM, decreased strength, hypomobility, increased edema, increased fascial restrictions, impaired UE functional  use, postural dysfunction, and pain.   ACTIVITY LIMITATIONS: carrying, lifting, sleeping, toileting, dressing, hygiene/grooming, and locomotion level  PARTICIPATION LIMITATIONS: meal prep, cleaning, laundry, driving, shopping, and community activity  PERSONAL FACTORS: 3+ comorbidities: anx, HTN, spine surgery, achillies tendon repair  are also affecting patient's functional outcome.   REHAB POTENTIAL: Good  CLINICAL DECISION MAKING: Evolving/moderate complexity  EVALUATION COMPLEXITY: Moderate   GOALS: Goals reviewed with patient? Yes  SHORT TERM GOALS: Target date: 05/29/2023   Independent with initial HEP Goal status: MET 06/04/2023  2.  Rt shoulder PROM improved 15 deg all directions for improved function Goal status:  MET 06/04/2023   LONG TERM GOALS: Target date: 06/26/2023  Independent with final HEP Goal  status:  MET 06/18/23  2.  FOTO score improved to 61 Goal status: MET 06/18/23  3.  Rt shoulder flexion and abduction AROM at least 120 deg for improved ADLs and independence Goal status: PARTIALLY MET 06/18/23  4.  Report pain < 3/10 with reaching activities for improved function Goal status: MET 06/18/23  5.  Demonstrate at least 4/5 Rt shoulder flexion and abduction strength for improved function Goal status: PARTIALLY MET 06/18/23   PLAN:  PT FREQUENCY: 2x/week  PT DURATION: 8 weeks  PLANNED INTERVENTIONS: Therapeutic exercises, Therapeutic activity, Neuromuscular re-education, Patient/Family education, Self Care, Joint mobilization, Aquatic Therapy, Dry Needling, Electrical stimulation, Cryotherapy, Moist heat, Taping, Vasopneumatic device, Ultrasound, Ionotophoresis 4mg /ml Dexamethasone, Manual therapy, and Re-evaluation.  PLAN FOR NEXT SESSION: d/c PT today   NEXT MD VISIT: PRN   Clarita Crane, PT, DPT 06/18/23 1:34 PM   PHYSICAL THERAPY DISCHARGE SUMMARY  Visits from Start of Care: 9  Current functional level related to goals / functional outcomes: See above   Remaining deficits: See above   Education / Equipment: HEP   Patient agrees to discharge. Patient goals were met. Patient is being discharged due to being pleased with the current functional level.  Clarita Crane, PT, DPT 06/18/23 1:36 PM  [facility] [address] [place], [place], [address] Phone: [phone]   Fax:  [phone]

## 2023-06-25 ENCOUNTER — Telehealth: Payer: Self-pay | Admitting: Family Medicine

## 2023-06-25 NOTE — Telephone Encounter (Signed)
Prescription Request  06/25/2023  LOV: 05/16/2023  What is the name of the medication or equipment?   oxyCODONE-acetaminophen (PERCOCET) 10-325 MG tablet [[id number]]  DISCONTINUED  **5MG  doesn't relieve pain**  Have you contacted your pharmacy to request a refill? Yes   Which pharmacy would you like this sent to?  [id number] [place], [place] - [address] [place] [address] Phone: [phone] Fax: [phone]    Patient notified that their request is being sent to the clinical staff for review and that they should receive a response within 2 business days.   Please advise patient at [phone].

## 2023-06-26 ENCOUNTER — Other Ambulatory Visit: Payer: Self-pay | Admitting: Family Medicine

## 2023-06-26 MED ORDER — OXYCODONE HCL 5 MG PO TABS: 5.0000 mg | ORAL_TABLET | ORAL | 0 refills | Status: DC | PRN

## 2023-06-30 ENCOUNTER — Other Ambulatory Visit: Payer: Self-pay | Admitting: Family Medicine

## 2023-06-30 DIAGNOSIS — F339 Major depressive disorder, recurrent, unspecified: Secondary | ICD-10-CM

## 2023-06-30 DIAGNOSIS — J302 Other seasonal allergic rhinitis: Secondary | ICD-10-CM

## 2023-07-01 NOTE — Telephone Encounter (Signed)
Requested Prescriptions  Pending Prescriptions Disp Refills   levocetirizine (XYZAL) 5 MG tablet [Pharmacy Med Name: LEVOCETIRIZINE 5 MG TABLET] 90 tablet 3    Sig: TAKE 1 TABLET BY MOUTH EVERY DAY IN THE EVENING     Ear, Nose, and Throat:  Antihistamines - levocetirizine dihydrochloride Failed - 06/30/2023  6:34 PM      Failed - Cr in normal range and within 360 days    Creat  Date Value Ref Range Status  12/12/2022 1.14 (H) 0.60 - 1.00 mg/dL Final   Creatinine, Ser  Date Value Ref Range Status  04/08/2023 1.14 (H) 0.44 - 1.00 mg/dL Final         Failed - Valid encounter within last 12 months    Recent Outpatient Visits           1 year ago Benign essential HTN   [facility] Donita Brooks, MD   2 years ago Benign essential HTN   [facility] Donita Brooks, MD   2 years ago Benign essential HTN   [facility] Donita Brooks, MD   3 years ago Acute idiopathic gout of right wrist   [facility] Tanya Nones, Priscille Heidelberg, MD   3 years ago General medical exam   [facility] Donita Brooks, MD              Passed - eGFR is 10 or above and within 360 days    GFR, Est African American  Date Value Ref Range Status  03/12/2021 55 (L) > OR = 60 mL/min/1.64m2 Final   GFR, Est Non African American  Date Value Ref Range Status  03/12/2021 47 (L) > OR = 60 mL/min/1.58m2 Final   GFR, Estimated  Date Value Ref Range Status  04/08/2023 51 (L) >60 mL/min Final    Comment:    (NOTE) Calculated using the CKD-EPI Creatinine Equation (2021)    eGFR  Date Value Ref Range Status  12/12/2022 51 (L) > OR = 60 mL/min/1.33m2 Final          sertraline (ZOLOFT) 50 MG tablet [Pharmacy Med Name: SERTRALINE HCL 50 MG TABLET] 135 tablet 1    Sig: TAKE 1 AND 1/2 TABLETS BY MOUTH DAILY     Psychiatry:  Antidepressants - SSRI - sertraline Failed - 06/30/2023  6:34 PM      Failed - Valid encounter within  last 6 months    Recent Outpatient Visits           1 year ago Benign essential HTN   [facility] Donita Brooks, MD   2 years ago Benign essential HTN   [facility] Tanya Nones, Priscille Heidelberg, MD   2 years ago Benign essential HTN   [facility] Donita Brooks, MD   3 years ago Acute idiopathic gout of right wrist   [facility] Pickard, Priscille Heidelberg, MD   3 years ago General medical exam   [facility] Donita Brooks, MD              Passed - AST in normal range and within 360 days    AST  Date Value Ref Range Status  12/12/2022 21 10 - 35 U/L Final         Passed - ALT in normal range and within 360 days    ALT  Date Value Ref Range Status  12/12/2022 18 6 - 29 U/L Final         Passed - Completed PHQ-2 or PHQ-9 in the last 360 days

## 2023-07-09 NOTE — Progress Notes (Signed)
[facility] - Clinic Note  07/16/2023   CHIEF COMPLAINT Patient presents for Retina Follow Up  HISTORY OF PRESENT ILLNESS: Faith Reeves is a 72 y.o. female who presents to the clinic today for:   HPI     Retina Follow Up   Patient presents with  Wet AMD.  In both eyes.  This started 8 weeks ago.  Duration of 8 weeks.  Since onset it is stable.  I, the attending physician,  performed the HPI with the patient and updated documentation appropriately.        Comments   8 week retina follow up ARMD OD and IVV OD pt is reporting no vision changes she denies any flashes or floaters       Last edited by Rennis Chris, MD on 07/16/2023 10:52 PM.     Referring physician: Donita Brooks, MD [address] [place],  [place] [address]  HISTORICAL INFORMATION:   Selected notes from the MEDICAL RECORD NUMBER Referred by Dr. Baker Pierini for concern of exu ARMD OU   CURRENT MEDICATIONS: No current outpatient medications on file. (Ophthalmic Drugs)   No current facility-administered medications for this visit. (Ophthalmic Drugs)   Current Outpatient Medications (Other)  Medication Sig   albuterol (PROVENTIL HFA;VENTOLIN HFA) 108 (90 Base) MCG/ACT inhaler Inhale 2 puffs into the lungs every 4 (four) hours as needed for wheezing or shortness of breath.   ALPRAZolam (XANAX) 0.5 MG tablet Take 1 tablet (0.5 mg total) by mouth 3 (three) times daily as needed for anxiety.   celecoxib (CELEBREX) 200 MG capsule TAKE 1 CAPSULE BY MOUTH EVERY DAY (Patient taking differently: Take 200 mg by mouth every other day.)   docusate sodium (COLACE) 100 MG capsule Take 1 capsule (100 mg total) by mouth 2 (two) times daily.   gabapentin (NEURONTIN) 300 MG capsule Take 1 capsule by mouth three times a day.   ipratropium (ATROVENT) 0.06 % nasal spray SPRAY 2 SPRAYS INTO EACH NOSTRIL 4 TIMES A DAY.   levocetirizine (XYZAL) 5 MG tablet TAKE 1 TABLET BY MOUTH EVERY DAY IN THE EVENING    mometasone (ELOCON) 0.1 % cream APPLY TO AFFECTED AREA TOPICALLY EVERY DAY   Multiple Vitamins-Minerals (EQ VISION FORMULA 50+ PO) Take 1 capsule by mouth daily.   oxyCODONE (OXY IR/ROXICODONE) 5 MG immediate release tablet Take 1 tablet (5 mg total) by mouth every 4 (four) hours as needed for moderate pain (pain score 4-6).   pantoprazole (PROTONIX) 40 MG tablet Take 1 tablet (40 mg total) by mouth daily. Courtesy refill. Annual needed for future refills.   Semaglutide-Weight Management (WEGOVY) 0.5 MG/0.5ML SOAJ Inject 0.5 mg into the skin once a week.   sertraline (ZOLOFT) 50 MG tablet TAKE 1 AND 1/2 TABLETS BY MOUTH DAILY   telmisartan-hydrochlorothiazide (MICARDIS HCT) 80-12.5 MG tablet Take 1 tablet by mouth daily.   traZODone (DESYREL) 50 MG tablet TAKE 1 TABLET BY MOUTH EVERYDAY AT BEDTIME   valACYclovir (VALTREX) 1000 MG tablet Take 2 tablets (2,000 mg total) by mouth 2 (two) times daily.   zolpidem (AMBIEN) 10 MG tablet TAKE 1 TABLET BY MOUTH EVERY DAY AT BEDTIME AS NEEDED FOR SLEEP   No current facility-administered medications for this visit. (Other)   REVIEW OF SYSTEMS: ROS   Positive for: Gastrointestinal, Neurological, Eyes Negative for: Constitutional, Skin, Genitourinary, Musculoskeletal, HENT, Endocrine, Cardiovascular, Respiratory, Psychiatric, Allergic/Imm, Heme/Lymph Last edited by Etheleen Mayhew, COT on 07/16/2023  1:51 PM.  ALLERGIES Allergies  Allergen Reactions   Cymbalta [Duloxetine Hcl] Swelling   PAST MEDICAL HISTORY Past Medical History:  Diagnosis Date   Allergy    Anemia    Anxiety    Arthritis    Blood transfusion without reported diagnosis    Cataract    GERD (gastroesophageal reflux disease)    Hypertension    Hypertensive retinopathy    OU   Macular degeneration    Past Surgical History:  Procedure Laterality Date   ABDOMINAL HYSTERECTOMY     ACHILLES TENDON REPAIR Bilateral    APPENDECTOMY     BACK SURGERY     BICEPT  TENODESIS Right 04/15/2023   Procedure: RIGHT BICEPS TENODESIS;  Surgeon: Cammy Copa, MD;  Location: [facility] OR;  Service: Orthopedics;  Laterality: Right;   BILATERAL CARPAL TUNNEL RELEASE     CHOLECYSTECTOMY     COLONOSCOPY     EYE SURGERY Right 09/18/2022   cataract extraction   HERNIA REPAIR     JOINT REPLACEMENT     MELANOMA EXCISION Right 09/15/2017   Procedure: EXCISION LIPOSARCOMA RIGHT ARM;  Surgeon: Violeta Gelinas, MD;  Location[facility] OR;  Service: General;  Laterality: Right;   REVERSE SHOULDER ARTHROPLASTY Right 04/15/2023   Procedure: RIGHT REVERSE SHOULDER ARTHROPLASTY;  Surgeon: Cammy Copa, MD;  Location: [facility] OR;  Service: Orthopedics;  Laterality: Right;   SPINE SURGERY     FAMILY HISTORY Family History  Problem Relation Age of Onset   Hypertension Mother    Cancer Sister    Stroke Maternal Grandmother    SOCIAL HISTORY Social History   Tobacco Use   Smoking status: Never   Smokeless tobacco: Never  Vaping Use   Vaping status: Never Used  Substance Use Topics   Alcohol use: Yes    Alcohol/week: 1.0 standard drink of alcohol    Types: 1 Glasses of wine per week   Drug use: No       OPHTHALMIC EXAM: Base Eye Exam     Visual Acuity (Snellen - Linear)       Right Left   Dist cc 20/50 -2 20/25 -2   Dist ph cc 20/40 -2 NI    Correction: Glasses         Tonometry (Tonopen, 1:57 PM)       Right Left   Pressure 16 17         Pupils       Pupils Dark Light Shape React APD   Right PERRL 3 2 Round Brisk None   Left PERRL 3 2 Round Brisk None         Visual Fields       Left Right    Full Full         Extraocular Movement       Right Left    Full, Ortho Full, Ortho         Neuro/Psych     Oriented x3: Yes   Mood/Affect: Normal         Dilation     Both eyes: 2.5% Phenylephrine @ 1:58 PM           Slit Lamp and Fundus Exam     Slit Lamp Exam       Right Left   Lids/Lashes Dermatochalasis - upper lid,  mild Meibomian gland dysfunction Dermatochalasis - upper lid, mild Meibomian gland dysfunction   Conjunctiva/Sclera White and quiet White and quiet   Cornea 2-3+ fine Punctate epithelial erosions 2+ fine  Punctate epithelial erosions inferiorly   Anterior Chamber deep, clear, narrow temporal angle deep, clear, narrow temporal angle   Iris Round and dilated Round and dilated   Lens PCIOL in good position 2-3+ Nuclear sclerosis, 2-3+ Cortical cataract   Anterior Vitreous Vitreous syneresis, Posterior vitreous detachment, prominent vitreous condensation now nasal to disc Vitreous syneresis, Posterior vitreous detachment         Fundus Exam       Right Left   Disc Pink and Sharp, Compact, +PPA Pink and Sharp, temporal PPP, Compact   C/D Ratio 0.2 0.1   Macula Blunted foveal reflex, drusen, RPE mottling, clumping and atrophy, +CNV, Stable resolution of central SRF, No heme, persistent low PED Flat, good foveal reflex, scattered Drusen, RPE mottling and clumping, focal PEDs, No heme or edema   Vessels attenuated, Tortuous attenuated, Tortuous   Periphery Attached, mild reticular degeneration, no heme Attached, no heme           Refraction     Wearing Rx       Sphere Cylinder Axis Add   Right +2.00 +0.50 008 +2.50   Left +2.50 +1.00 154 +2.50    Type: PAL           IMAGING AND PROCEDURES  Imaging and Procedures for @TODAY @  OCT, Retina - OU - Both Eyes       Right Eye Quality was good. Central Foveal Thickness: 215. Progression has been stable. Findings include normal foveal contour, no IRF, no SRF, retinal drusen , pigment epithelial detachment, outer retinal atrophy (Stable improvement in central SRF overlying low, stable PEDs, central ORA).   Left Eye Quality was good. Central Foveal Thickness: 271. Progression has been stable. Findings include normal foveal contour, no IRF, no SRF, retinal drusen .   Notes *Images captured and stored on drive  Diagnosis /  Impression:  OD: stable improvement in central SRF overlying low, stable PEDs, central ORA OS: non-exu ARMD -- stable  Clinical management:  See below  Abbreviations: NFP - Normal foveal profile. CME - cystoid macular edema. PED - pigment epithelial detachment. IRF - intraretinal fluid. SRF - subretinal fluid. EZ - ellipsoid zone. ERM - epiretinal membrane. ORA - outer retinal atrophy. ORT - outer retinal tubulation. SRHM - subretinal hyper-reflective material     Intravitreal Injection, Pharmacologic Agent - OD - Right Eye       Time Out 07/16/2023. 2:51 PM. Confirmed correct patient, procedure, site, and patient consented.   Anesthesia Topical anesthesia was used. Anesthetic medications included Lidocaine 2%, Proparacaine 0.5%.   Procedure Preparation included 5% betadine to ocular surface, eyelid speculum. A (32g) needle was used.   Injection: 6 mg faricimab-svoa 6 MG/0.05ML   Route: Intravitreal, Site: Right Eye   NDC: [phone], Lot: [id number], Expiration date: 07/03/2025, Waste: 0 mL   Post-op Post injection exam found visual acuity of at least counting fingers. The patient tolerated the procedure well. There were no complications. The patient received written and verbal post procedure care education. Post injection medications were not given.            ASSESSMENT/PLAN:    ICD-10-CM   1. Exudative age-related macular degeneration of right eye with active choroidal neovascularization (HCC)  H35.3211 OCT, Retina - OU - Both Eyes    Intravitreal Injection, Pharmacologic Agent - OD - Right Eye    faricimab-svoa (VABYSMO) 6mg /0.52mL intravitreal injection    2. Intermediate stage nonexudative age-related macular degeneration of left eye  H35.3122  3. Essential hypertension  I10     4. Hypertensive retinopathy of both eyes  H35.033     5. Combined forms of age-related cataract of left eye  H25.812     6. Pseudophakia  Z96.1     7. Glaucoma suspect of both eyes   H40.003     8. Dry eyes  H04.123       1. Exudative age related macular degeneration, right eye  - FA (03.10.20) shows +CNV w/ staining/leakage  - repeat FA 09.13.21 shows interval increase in perifoveal leakage OD - s/p IVA OD #1 (03.10.20), #2 (04.13.20), #3 (05.11.20), #4 (06.23.20), #5 (8.18.20), #6 (10.27.20), #7 (01.26.21), #8 (04.26.21), #9 (08.02.21), #10 (09.13.21), #11 (10.11.21), #12 (11.9.21), #13 (12.14.21), #14 (1.24.22), #15 (3.15.22), #16 (05.24.22), #17 (8.9.22), #18 (10.18.22), #19 (01.03.23), #20 (03.29.23) -- IVA resistance ========================================================= - s/p IVE OD #1 (06.07.23), #2 (07.19.23), #3 (09.01.23), #4 (10.13.23), #5 (11.10.23), #6 (12.22.23) -- IVE resistance =========================================================  - s/p IVV OD #1 (02.02.24), #2 (03.15.24), #3 (05.01.24), #4 (06.05.24), #5 (07.17.24) - pt was doing very well with q3 mo maintenance injections, but then developed interval increases in central SRF over several visits -- IVA and IVE resistance  - BCVA OD 20/30 -- stable - OCT today stable improvement in central SRF overlying low, stable PEDs at 8 weeks   - recommend IVV OD #6 today, 09.11.24 w/ f/u ext to 8-10 wks  - pt wishes to be treated with IVV OD  - RBA of procedure discussed, questions answered  - IVV informed consent obtained and signed, 02.02.24 (OD)  - see procedure note  - f/u in 8-10 wks -- DFE/OCT/possible injection  2. Age related macular degeneration, non-exudative, left eye - The incidence, anatomy, and pathology of dry AMD, risk of progression, and the AREDS and AREDS 2 study including smoking risks discussed with patient.  - intermediate stage  - recommend amsler grid monitoring  3,4. Hypertensive retinopathy OU  - discussed importance of tight BP control  - monitor  5. Mixed form age related cataracts OS - The symptoms of cataract, surgical options, and treatments and risks were discussed  with patient  - under the expert management of Dr. Zenaida Niece  6. Pseudophakia OD  - s/p CE/IOL (Dr. Zenaida Niece, 11.15.23)  - IOL in good position, doing well  - monitor  7. Glaucoma Suspect OU  - IOP 16,17  - under the expert management of Dr. Zenaida Niece  8. Dry eyes OU  - decreased vision OD today mostly due to increased PEE / dry eyes  - pt reports stopping ATs after hearing about product recalls on ATs - recommend artificial tears and lubricating ointment as needed   Return in 8 weeks (on 09/10/2023) for f/u exu ARMD OD, DFE, OCT, Possible Injxn. '  Ophthalmic Meds Ordered this visit:  Meds ordered this encounter  Medications   faricimab-svoa (VABYSMO) 6mg /0.28mL intravitreal injection      This document serves as a record of services personally performed by Karie Chimera, MD, PhD. It was created on their behalf by De Blanch, an ophthalmic technician. The creation of this record is the provider's dictation and/or activities during the visit.    Electronically signed by: De Blanch, OA, 07/16/23  10:55 PM  This document serves as a record of services personally performed by Karie Chimera, MD, PhD. It was created on their behalf by Glee Arvin. Manson Passey, OA an ophthalmic technician. The creation of this record is the provider's dictation and/or activities during  the visit.    Electronically signed by: Glee Arvin. Manson Passey, OA 07/16/23 10:55 PM   Karie Chimera, M.D., Ph.D. Diseases & Surgery of the Retina and Vitreous [facility]  I have reviewed the above documentation for accuracy and completeness, and I agree with the above. Karie Chimera, M.D., Ph.D. 07/16/23 10:57 PM   Abbreviations: M myopia (nearsighted); A astigmatism; H hyperopia (farsighted); P presbyopia; Mrx spectacle prescription;  CTL contact lenses; OD right eye; OS left eye; OU both eyes  XT exotropia; ET esotropia; PEK punctate epithelial keratitis; PEE punctate epithelial erosions; DES dry eye  syndrome; MGD meibomian gland dysfunction; ATs artificial tears; PFAT's preservative free artificial tears; NSC nuclear sclerotic cataract; PSC posterior subcapsular cataract; ERM epi-retinal membrane; PVD posterior vitreous detachment; RD retinal detachment; DM diabetes mellitus; DR diabetic retinopathy; NPDR non-proliferative diabetic retinopathy; PDR proliferative diabetic retinopathy; CSME clinically significant macular edema; DME diabetic macular edema; dbh dot blot hemorrhages; CWS cotton wool spot; POAG primary open angle glaucoma; C/D cup-to-disc ratio; HVF humphrey visual field; GVF goldmann visual field; OCT optical coherence tomography; IOP intraocular pressure; BRVO Branch retinal vein occlusion; CRVO central retinal vein occlusion; CRAO central retinal artery occlusion; BRAO branch retinal artery occlusion; RT retinal tear; SB scleral buckle; PPV pars plana vitrectomy; VH Vitreous hemorrhage; PRP panretinal laser photocoagulation; IVK intravitreal kenalog; VMT vitreomacular traction; MH Macular hole;  NVD neovascularization of the disc; NVE neovascularization elsewhere; AREDS age related eye disease study; ARMD age related macular degeneration; POAG primary open angle glaucoma; EBMD epithelial/anterior basement membrane dystrophy; ACIOL anterior chamber intraocular lens; IOL intraocular lens; PCIOL posterior chamber intraocular lens; Phaco/IOL phacoemulsification with intraocular lens placement; PRK photorefractive keratectomy; LASIK laser assisted in situ keratomileusis; HTN hypertension; DM diabetes mellitus; COPD chronic obstructive pulmonary disease

## 2023-07-16 ENCOUNTER — Ambulatory Visit (INDEPENDENT_AMBULATORY_CARE_PROVIDER_SITE_OTHER): Payer: Medicare Other | Admitting: Ophthalmology

## 2023-07-16 ENCOUNTER — Encounter (INDEPENDENT_AMBULATORY_CARE_PROVIDER_SITE_OTHER): Payer: Self-pay | Admitting: Ophthalmology

## 2023-07-16 DIAGNOSIS — H353211 Exudative age-related macular degeneration, right eye, with active choroidal neovascularization: Secondary | ICD-10-CM | POA: Diagnosis not present

## 2023-07-16 DIAGNOSIS — H353122 Nonexudative age-related macular degeneration, left eye, intermediate dry stage: Secondary | ICD-10-CM | POA: Diagnosis not present

## 2023-07-16 DIAGNOSIS — H04123 Dry eye syndrome of bilateral lacrimal glands: Secondary | ICD-10-CM | POA: Diagnosis not present

## 2023-07-16 DIAGNOSIS — Z961 Presence of intraocular lens: Secondary | ICD-10-CM

## 2023-07-16 DIAGNOSIS — H25812 Combined forms of age-related cataract, left eye: Secondary | ICD-10-CM

## 2023-07-16 DIAGNOSIS — H40003 Preglaucoma, unspecified, bilateral: Secondary | ICD-10-CM | POA: Diagnosis not present

## 2023-07-16 DIAGNOSIS — H35033 Hypertensive retinopathy, bilateral: Secondary | ICD-10-CM

## 2023-07-16 DIAGNOSIS — I10 Essential (primary) hypertension: Secondary | ICD-10-CM

## 2023-07-16 MED ORDER — FARICIMAB-SVOA 6 MG/0.05ML IZ SOLN
6.0000 mg | INTRAVITREAL | Status: AC | PRN
Start: 2023-07-16 — End: 2023-07-16
  Administered 2023-07-16: 6 mg via INTRAVITREAL

## 2023-07-23 ENCOUNTER — Telehealth: Payer: Self-pay | Admitting: Family Medicine

## 2023-07-23 ENCOUNTER — Other Ambulatory Visit: Payer: Self-pay

## 2023-07-23 DIAGNOSIS — T7840XD Allergy, unspecified, subsequent encounter: Secondary | ICD-10-CM

## 2023-07-23 MED ORDER — IPRATROPIUM BROMIDE 0.06 % NA SOLN
NASAL | 3 refills | Status: DC
Start: 2023-07-23 — End: 2023-09-04

## 2023-07-23 NOTE — Telephone Encounter (Signed)
Prescription Request  07/23/2023  LOV: 05/16/2023  What is the name of the medication or equipment?   ipratropium (ATROVENT) 0.06 % nasal spray  **90 day script requested**  Have you contacted your pharmacy to request a refill? Yes   Which pharmacy would you like this sent to?  [id number] [place], [place] - [address] [place] [address] Phone: [phone] Fax: [phone]    Patient notified that their request is being sent to the clinical staff for review and that they should receive a response within 2 business days.   Please advise pharmacist.

## 2023-07-25 ENCOUNTER — Ambulatory Visit (INDEPENDENT_AMBULATORY_CARE_PROVIDER_SITE_OTHER): Payer: Medicare Other | Admitting: Family Medicine

## 2023-07-25 ENCOUNTER — Other Ambulatory Visit: Payer: Self-pay | Admitting: Family Medicine

## 2023-07-25 VITALS — BP 126/76 | HR 72 | Temp 97.6°F | Ht 59.0 in | Wt 230.0 lb

## 2023-07-25 DIAGNOSIS — T7840XD Allergy, unspecified, subsequent encounter: Secondary | ICD-10-CM

## 2023-07-25 DIAGNOSIS — M4120 Other idiopathic scoliosis, site unspecified: Secondary | ICD-10-CM

## 2023-07-25 DIAGNOSIS — I10 Essential (primary) hypertension: Secondary | ICD-10-CM | POA: Diagnosis not present

## 2023-07-25 MED ORDER — OXYCODONE-ACETAMINOPHEN 10-325 MG PO TABS
1.0000 | ORAL_TABLET | Freq: Three times a day (TID) | ORAL | 0 refills | Status: DC | PRN
Start: 2023-07-25 — End: 2023-08-18

## 2023-07-25 MED ORDER — ZOLPIDEM TARTRATE 10 MG PO TABS: ORAL_TABLET | ORAL | 5 refills | Status: DC

## 2023-07-25 MED ORDER — AMOXICILLIN 875 MG PO TABS: 875.0000 mg | ORAL_TABLET | Freq: Two times a day (BID) | ORAL | 0 refills | Status: AC

## 2023-07-25 NOTE — Progress Notes (Signed)
Subjective:    Patient ID: Faith Reeves, female    DOB: 1951/10/18, 72 y.o.   MRN: [id number]  Patient is currently on Ozempic 1 mg subcu weekly.  However the patient has lost only 5 pounds since June.  She is actually gained 3 pounds over the last 2 months.  She is seeing very little success with this medication.  She denies any nausea or vomiting.  She does occasionally have some diarrhea.  Her husband states that she is not exercising or dieting.  Her blood pressure today is well-controlled at 126/76.  She is requesting a refill of her pain medication that she takes for her severe scoliosis.  She also reports severe pain in her left maxillary sinus.  This been going on for more than a week.  She reports constant sharp pain in her left cheek.  She saw the dentist today and he did an x-ray saying that there was inflammation in her sinus.  She does report postnasal drip and rhinorrhea despite taking allergy medication. Wt Readings from Last 3 Encounters:  07/25/23 230 lb (104.3 kg)  05/16/23 227 lb (103 kg)  04/15/23 235 lb (106.6 kg)    Past Medical History:  Diagnosis Date   Allergy    Anemia    Anxiety    Arthritis    Blood transfusion without reported diagnosis    Cataract    GERD (gastroesophageal reflux disease)    Hypertension    Hypertensive retinopathy    OU   Macular degeneration    Past Surgical History:  Procedure Laterality Date   ABDOMINAL HYSTERECTOMY     ACHILLES TENDON REPAIR Bilateral    APPENDECTOMY     BACK SURGERY     BICEPT TENODESIS Right 04/15/2023   Procedure: RIGHT BICEPS TENODESIS;  Surgeon: Cammy Copa, MD;  Location: [facility] OR;  Service: Orthopedics;  Laterality: Right;   BILATERAL CARPAL TUNNEL RELEASE     CHOLECYSTECTOMY     COLONOSCOPY     EYE SURGERY Right 09/18/2022   cataract extraction   HERNIA REPAIR     JOINT REPLACEMENT     MELANOMA EXCISION Right 09/15/2017   Procedure: EXCISION LIPOSARCOMA RIGHT ARM;  Surgeon: Violeta Gelinas, MD;   Location: [facility] OR;  Service: General;  Laterality: Right;   REVERSE SHOULDER ARTHROPLASTY Right 04/15/2023   Procedure: RIGHT REVERSE SHOULDER ARTHROPLASTY;  Surgeon: Cammy Copa, MD;  Location: [facility] OR;  Service: Orthopedics;  Laterality: Right;   SPINE SURGERY     Current Outpatient Medications on File Prior to Visit  Medication Sig Dispense Refill   albuterol (PROVENTIL HFA;VENTOLIN HFA) 108 (90 Base) MCG/ACT inhaler Inhale 2 puffs into the lungs every 4 (four) hours as needed for wheezing or shortness of breath. 1 Inhaler 0   ALPRAZolam (XANAX) 0.5 MG tablet Take 1 tablet (0.5 mg total) by mouth 3 (three) times daily as needed for anxiety. 30 tablet 0   celecoxib (CELEBREX) 200 MG capsule TAKE 1 CAPSULE BY MOUTH EVERY DAY (Patient taking differently: Take 200 mg by mouth every other day.) 90 capsule 3   docusate sodium (COLACE) 100 MG capsule Take 1 capsule (100 mg total) by mouth 2 (two) times daily. 10 capsule 0   gabapentin (NEURONTIN) 300 MG capsule Take 1 capsule by mouth three times a day. 90 capsule 5   ipratropium (ATROVENT) 0.06 % nasal spray SPRAY 2 SPRAYS INTO EACH NOSTRIL 4 TIMES A DAY. 15 mL 3   levocetirizine (XYZAL) 5 MG tablet TAKE  1 TABLET BY MOUTH EVERY DAY IN THE EVENING 90 tablet 3   mometasone (ELOCON) 0.1 % cream APPLY TO AFFECTED AREA TOPICALLY EVERY DAY 45 g 2   Multiple Vitamins-Minerals (EQ VISION FORMULA 50+ PO) Take 1 capsule by mouth daily.     oxyCODONE (OXY IR/ROXICODONE) 5 MG immediate release tablet Take 1 tablet (5 mg total) by mouth every 4 (four) hours as needed for moderate pain (pain score 4-6). 30 tablet 0   pantoprazole (PROTONIX) 40 MG tablet Take 1 tablet (40 mg total) by mouth daily. Courtesy refill. Annual needed for future refills. 30 tablet 0   Semaglutide-Weight Management (WEGOVY) 0.5 MG/0.5ML SOAJ Inject 0.5 mg into the skin once a week. 2 mL 3   sertraline (ZOLOFT) 50 MG tablet TAKE 1 AND 1/2 TABLETS BY MOUTH DAILY 135 tablet 1    telmisartan-hydrochlorothiazide (MICARDIS HCT) 80-12.5 MG tablet Take 1 tablet by mouth daily. 90 tablet 1   traZODone (DESYREL) 50 MG tablet TAKE 1 TABLET BY MOUTH EVERYDAY AT BEDTIME 90 tablet 1   valACYclovir (VALTREX) 1000 MG tablet Take 2 tablets (2,000 mg total) by mouth 2 (two) times daily. 4 tablet 2   zolpidem (AMBIEN) 10 MG tablet TAKE 1 TABLET BY MOUTH EVERY DAY AT BEDTIME AS NEEDED FOR SLEEP 30 tablet 5   No current facility-administered medications on file prior to visit.   Allergies  Allergen Reactions   Cymbalta [Duloxetine Hcl] Swelling   Social History   Socioeconomic History   Marital status: Married    Spouse name: Not on file   Number of children: Not on file   Years of education: Not on file   Highest education level: Not on file  Occupational History   Not on file  Tobacco Use   Smoking status: Never   Smokeless tobacco: Never  Vaping Use   Vaping status: Never Used  Substance and Sexual Activity   Alcohol use: Yes    Alcohol/week: 1.0 standard drink of alcohol    Types: 1 Glasses of wine per week   Drug use: No   Sexual activity: Never  Other Topics Concern   Not on file  Social History Narrative   Recent death of son; lives with husband; originally from [place]; most of family still lives in the [place]    Social Determinants of Health   Financial Resource Strain: Low Risk  (10/10/2022)   Overall Financial Resource Strain (CARDIA)    Difficulty of Paying Living Expenses: Not hard at all  Food Insecurity: No Food Insecurity (10/10/2022)   Hunger Vital Sign    Worried About Running Out of Food in the Last Year: Never true    Ran Out of Food in the Last Year: Never true  Transportation Needs: No Transportation Needs (10/10/2022)   PRAPARE - Administrator, Civil Service (Medical): No    Lack of Transportation (Non-Medical): No  Physical Activity: Inactive (10/10/2022)   Exercise Vital Sign    Days of Exercise per Week: 0 days    Minutes  of Exercise per Session: 0 min  Stress: No Stress Concern Present (10/10/2022)   [employer] of Occupational Health - Occupational Stress Questionnaire    Feeling of Stress : Not at all  Social Connections: Moderately Isolated (10/10/2022)   Social Connection and Isolation Panel [NHANES]    Frequency of Communication with Friends and Family: More than three times a week    Frequency of Social Gatherings with Friends and Family: Twice a  week    Attends Religious Services: Never    Active Member of Clubs or Organizations: No    Attends Banker Meetings: Never    Marital Status: Married  Catering manager Violence: Not At Risk (10/10/2022)   Humiliation, Afraid, Rape, and Kick questionnaire    Fear of Current or Ex-Partner: No    Emotionally Abused: No    Physically Abused: No    Sexually Abused: No     Review of Systems  All other systems reviewed and are negative.      Objective:   Physical Exam Constitutional:      Appearance: She is obese.  Cardiovascular:     Rate and Rhythm: Normal rate and regular rhythm.     Heart sounds: Normal heart sounds.  Pulmonary:     Effort: Pulmonary effort is normal.     Breath sounds: Normal breath sounds.  Musculoskeletal:     Right shoulder: Tenderness and crepitus present. Decreased range of motion. Decreased strength.     Thoracic back: Scoliosis present.     Lumbar back: Scoliosis present.  Neurological:     Mental Status: She is alert.          Assessment & Plan:  Benign essential HTN - Plan: COMPLETE METABOLIC PANEL WITH GFR, Lipid panel  Morbid obesity (HCC)  Other idiopathic scoliosis, unspecified spinal region I will refill the patient's Percocet 10/325.  She takes 1 pill a day.  30 pills last 1 month.  Also refilled her Ambien that she takes 1 a day to help her sleep at night.  Her blood pressure is excellent but I will check a CMP and a lipid panel while the patient is here.  I recommended a flu shot as  well as a shingles shot.  Will switch the patient from Ozempic to tirzepatide 0.5 mg subcu weekly.  Recommended a flu shot as well as a COVID booster

## 2023-07-26 DIAGNOSIS — Z23 Encounter for immunization: Secondary | ICD-10-CM | POA: Diagnosis not present

## 2023-07-26 LAB — COMPLETE METABOLIC PANEL WITH GFR
AG Ratio: 1.2 (calc) (ref 1.0–2.5)
ALT: 10 U/L (ref 6–29)
AST: 14 U/L (ref 10–35)
Albumin: 3.6 g/dL (ref 3.6–5.1)
Alkaline phosphatase (APISO): 118 U/L (ref 37–153)
BUN/Creatinine Ratio: 15 (calc) (ref 6–22)
BUN: 17 mg/dL (ref 7–25)
CO2: 31 mmol/L (ref 20–32)
Calcium: 9.4 mg/dL (ref 8.6–10.4)
Chloride: 103 mmol/L (ref 98–110)
Creat: 1.16 mg/dL — ABNORMAL HIGH (ref 0.60–1.00)
Globulin: 3 g/dL (calc) (ref 1.9–3.7)
Glucose, Bld: 80 mg/dL (ref 65–99)
Potassium: 4.3 mmol/L (ref 3.5–5.3)
Sodium: 141 mmol/L (ref 135–146)
Total Bilirubin: 0.4 mg/dL (ref 0.2–1.2)
Total Protein: 6.6 g/dL (ref 6.1–8.1)
eGFR: 49 mL/min/{1.73_m2} — ABNORMAL LOW (ref >=60)

## 2023-07-26 LAB — LIPID PANEL
Cholesterol: 137 mg/dL (ref <200)
HDL: 54 mg/dL (ref >=50)
LDL Cholesterol (Calc): 68 mg/dL (calc)
Non-HDL Cholesterol (Calc): 83 mg/dL (calc) (ref <130)
Total CHOL/HDL Ratio: 2.5 (calc) (ref <5.0)
Triglycerides: 66 mg/dL (ref <150)

## 2023-07-26 LAB — EXTRA LAV TOP TUBE

## 2023-07-28 NOTE — Telephone Encounter (Signed)
Prescription Request  07/28/2023  LOV: 07/25/2023  What is the name of the medication or equipment? telmisartan-hydrochlorothiazide (MICARDIS HCT) 80-12.5 MG tablet [[id number]]  Have you contacted your pharmacy to request a refill? Yes   Which pharmacy would you like this sent to?  [id number] [place], [place] - [address] [place] [address] Phone: [phone] Fax: [phone]    Patient notified that their request is being sent to the clinical staff for review and that they should receive a response within 2 business days.   Please advise at [facility] [phone]

## 2023-07-28 NOTE — Telephone Encounter (Signed)
Requested Prescriptions  Refused Prescriptions Disp Refills   ipratropium (ATROVENT) 0.06 % nasal spray [Pharmacy Med Name: IPRATROPIUM 0.06% SPRAY]  1    Sig: SPRAY 2 SPRAYS INTO EACH NOSTRIL 4 TIMES A DAY.     Off-Protocol Failed - 07/28/2023 10:33 AM      Failed - Medication not assigned to a protocol, review manually.      Failed - Valid encounter within last 12 months    Recent Outpatient Visits           1 year ago Benign essential HTN   [facility] Tanya Nones, Priscille Heidelberg, MD   2 years ago Benign essential HTN   [facility] Tanya Nones, Priscille Heidelberg, MD   2 years ago Benign essential HTN   [facility] Donita Brooks, MD   3 years ago Acute idiopathic gout of right wrist   [facility] Pickard, Priscille Heidelberg, MD   3 years ago General medical exam   [facility] Donita Brooks, MD             Off-Protocol Failed - 07/28/2023 10:33 AM      Failed - Medication not assigned to a protocol, review manually.      Failed - Valid encounter within last 12 months    Recent Outpatient Visits           1 year ago Benign essential HTN   [facility] Tanya Nones Priscille Heidelberg, MD   2 years ago Benign essential HTN   [facility] Tanya Nones, Priscille Heidelberg, MD   2 years ago Benign essential HTN   [facility] Donita Brooks, MD   3 years ago Acute idiopathic gout of right wrist   [facility] Pickard, Priscille Heidelberg, MD   3 years ago General medical exam   [facility] Pickard, Priscille Heidelberg, MD

## 2023-07-29 ENCOUNTER — Other Ambulatory Visit: Payer: Self-pay | Admitting: Family Medicine

## 2023-07-29 DIAGNOSIS — T7840XD Allergy, unspecified, subsequent encounter: Secondary | ICD-10-CM

## 2023-07-30 NOTE — Telephone Encounter (Signed)
Duplicate request, medication was refilled 07/23/23.  Requested Prescriptions  Pending Prescriptions Disp Refills   ipratropium (ATROVENT) 0.06 % nasal spray [Pharmacy Med Name: IPRATROPIUM 0.06% SPRAY]  1    Sig: SPRAY 2 SPRAYS INTO EACH NOSTRIL 4 TIMES A DAY.     Off-Protocol Failed - 07/29/2023 12:32 PM      Failed - Medication not assigned to a protocol, review manually.      Failed - Valid encounter within last 12 months    Recent Outpatient Visits           1 year ago Benign essential HTN   [facility] Tanya Nones, Priscille Heidelberg, MD   2 years ago Benign essential HTN   [facility] Tanya Nones, Priscille Heidelberg, MD   2 years ago Benign essential HTN   [facility] Donita Brooks, MD   3 years ago Acute idiopathic gout of right wrist   [facility] Pickard, Priscille Heidelberg, MD   3 years ago General medical exam   [facility] Donita Brooks, MD             Off-Protocol Failed - 07/29/2023 12:32 PM      Failed - Medication not assigned to a protocol, review manually.      Failed - Valid encounter within last 12 months    Recent Outpatient Visits           1 year ago Benign essential HTN   [facility] Tanya Nones Priscille Heidelberg, MD   2 years ago Benign essential HTN   [facility] Tanya Nones, Priscille Heidelberg, MD   2 years ago Benign essential HTN   [facility] Family [facility] Donita Brooks, MD   3 years ago Acute idiopathic gout of right wrist   [facility] Family Medicine Pickard, Priscille Heidelberg, MD   3 years ago General medical exam   [facility] Pickard, Priscille Heidelberg, MD

## 2023-08-18 ENCOUNTER — Telehealth: Payer: Self-pay

## 2023-08-18 ENCOUNTER — Other Ambulatory Visit: Payer: Self-pay | Admitting: Family Medicine

## 2023-08-18 MED ORDER — OXYCODONE-ACETAMINOPHEN 10-325 MG PO TABS: 1.0000 | ORAL_TABLET | Freq: Three times a day (TID) | ORAL | 0 refills | Status: AC | PRN

## 2023-08-18 MED ORDER — ZOLPIDEM TARTRATE 10 MG PO TABS: ORAL_TABLET | ORAL | 5 refills | Status: DC

## 2023-08-18 NOTE — Telephone Encounter (Signed)
Pt called in to request refills of these meds please:  oxyCODONE-acetaminophen (PERCOCET) 10-325 MG tablet [[id number]] zolpidem (AMBIEN) 10 MG tablet [[id number]]  LOV: 07/25/23  PHARMACY: [id number] [place], [place] - [address] AT [address] [place], [place] [address] Phone: [phone]  Fax: [phone] DEA #: [id number] DAW Reason: --   CB#: [id number]

## 2023-08-23 ENCOUNTER — Other Ambulatory Visit: Payer: Self-pay | Admitting: Family Medicine

## 2023-08-23 DIAGNOSIS — T7840XD Allergy, unspecified, subsequent encounter: Secondary | ICD-10-CM

## 2023-08-25 NOTE — Telephone Encounter (Signed)
Requested medication (s) are due for refill today:   Provider to review  Requested medication (s) are on the active medication list:   Yes  Future visit scheduled:   No     LOV 07/25/2023   Last ordered: 07/23/2023 15 ml, 3 refills  Returned because there is not a protocol assigned to this medication.   Another medication the Micardis HCT did not come up on the list with a protocol either.      Requested Prescriptions  Pending Prescriptions Disp Refills   ipratropium (ATROVENT) 0.06 % nasal spray [Pharmacy Med Name: IPRATROPIUM 0.06% SPRAY]  1    Sig: SPRAY 2 SPRAYS INTO EACH NOSTRIL 4 TIMES A DAY.     Off-Protocol Failed - 08/25/2023  9:08 AM      Failed - Medication not assigned to a protocol, review manually.      Failed - Valid encounter within last 12 months    Recent Outpatient Visits           1 year ago Benign essential HTN   [facility] Tanya Nones, Priscille Heidelberg, MD   2 years ago Benign essential HTN   [facility] Tanya Nones, Priscille Heidelberg, MD   3 years ago Benign essential HTN   [facility] Tanya Nones Priscille Heidelberg, MD   3 years ago Acute idiopathic gout of right wrist   [facility] Family [facility] Pickard, Priscille Heidelberg, MD   3 years ago General medical exam   [facility] Donita Brooks, MD             Off-Protocol Failed - 08/25/2023  9:08 AM      Failed - Medication not assigned to a protocol, review manually.      Failed - Valid encounter within last 12 months    Recent Outpatient Visits           1 year ago Benign essential HTN   [facility] Tanya Nones Priscille Heidelberg, MD   2 years ago Benign essential HTN   [facility] Tanya Nones, Priscille Heidelberg, MD   3 years ago Benign essential HTN   [facility] Family Medicine Donita Brooks, MD   3 years ago Acute idiopathic gout of right wrist   [facility] Medicine Pickard, Priscille Heidelberg, MD   3 years ago General medical exam   [facility]  Family Medicine Pickard, Priscille Heidelberg, MD

## 2023-08-25 NOTE — Telephone Encounter (Signed)
Requested medication (s) are due for refill today:   Provider to review  Requested medication (s) are on the active medication list:   Yes  Future visit scheduled:   No     LOV 07/25/2023   Last ordered: 07/23/2023 15 ml, 3 refills  Returned because there isn't a protocol assigned to this medication.   Micardis HCT 01/27/2023 #90, 1 refill came up but not with a protocol.   Requested Prescriptions  Pending Prescriptions Disp Refills   ipratropium (ATROVENT) 0.06 % nasal spray [Pharmacy Med Name: IPRATROPIUM 0.06% SPRAY]  1    Sig: SPRAY 2 SPRAYS INTO EACH NOSTRIL 4 TIMES A DAY.     Off-Protocol Failed - 08/25/2023  9:08 AM      Failed - Medication not assigned to a protocol, review manually.      Failed - Valid encounter within last 12 months    Recent Outpatient Visits           1 year ago Benign essential HTN   [facility] Tanya Nones, Priscille Heidelberg, MD   2 years ago Benign essential HTN   [facility] Tanya Nones, Priscille Heidelberg, MD   3 years ago Benign essential HTN   [facility] Tanya Nones Priscille Heidelberg, MD   3 years ago Acute idiopathic gout of right wrist   [facility] Medicine Pickard, Priscille Heidelberg, MD   3 years ago General medical exam   [facility] Donita Brooks, MD             Off-Protocol Failed - 08/25/2023  9:08 AM      Failed - Medication not assigned to a protocol, review manually.      Failed - Valid encounter within last 12 months    Recent Outpatient Visits           1 year ago Benign essential HTN   [facility] Tanya Nones Priscille Heidelberg, MD   2 years ago Benign essential HTN   [facility] Tanya Nones, Priscille Heidelberg, MD   3 years ago Benign essential HTN   [facility] Medicine Donita Brooks, MD   3 years ago Acute idiopathic gout of right wrist   [facility] Pickard, Priscille Heidelberg, MD   3 years ago General medical exam   [facility]  Pickard, Priscille Heidelberg, MD

## 2023-08-25 NOTE — Telephone Encounter (Signed)
Prescription Request  08/25/2023  LOV: 07/25/2023  What is the name of the medication or equipment? telmisartan-hydrochlorothiazide (MICARDIS HCT) 80-12.5 MG tablet [[id number]]   Have you contacted your pharmacy to request a refill? Yes   Which pharmacy would you like this sent to?  [id number] [place], [place] - [address] [place] [address] Phone: [phone] Fax: [phone]    Patient notified that their request is being sent to the clinical staff for review and that they should receive a response within 2 business days.   Please advise at [facility] [phone]

## 2023-08-28 NOTE — Telephone Encounter (Signed)
Pharmacy needs refills of   ipratropium (ATROVENT) 0.06 % nasal spray [[id number]]   telmisartan-hydrochlorothiazide (MICARDIS HCT) 80-12.5 MG tablet  **90 day script requested**  LOV 07/25/2023  Pharmacy:   [id number] [place], [place] - [address] AT [address] [place] [address] Phone: [phone] Fax: [phone]  Please advise pharmacist.

## 2023-08-29 ENCOUNTER — Other Ambulatory Visit: Payer: Self-pay | Admitting: Surgical

## 2023-08-29 ENCOUNTER — Telehealth: Payer: Self-pay | Admitting: *Deleted

## 2023-08-29 MED ORDER — AMOXICILLIN 500 MG PO TABS: 2000.0000 mg | ORAL_TABLET | Freq: Once | ORAL | 0 refills | Status: AC

## 2023-08-29 NOTE — Telephone Encounter (Signed)
F/U call for R-Reverse Shoulder arthroplasty. SANE question reported as 78%. Patient went to dentist yesterday and cavities filled. Did not get an antibiotic for this. I just happened to call her today- she asked if anything needs to be done now? If not, could you go ahead and send abx for next time to her pharmacy. Thank you.

## 2023-08-29 NOTE — Telephone Encounter (Signed)
At this point I do not think anything needs to be done now.  I will send in antibiotic prescription so that she can take it prior to next dental appointment whenever that may be.

## 2023-09-05 NOTE — Progress Notes (Signed)
[facility] - Clinic Note  09/10/2023   CHIEF COMPLAINT Patient presents for Retina Follow Up  HISTORY OF PRESENT ILLNESS: Faith Reeves is a 72 y.o. female who presents to the clinic today for:   HPI     Retina Follow Up   Patient presents with  Wet AMD.  In both eyes.  This started 8 weeks ago.  Duration of 8 weeks.  Since onset it is stable.  I, the attending physician,  performed the HPI with the patient and updated documentation appropriately.        Comments   8 week retina follow up ARMD OU and IVV OD pt is reporting no vision changes noticed she has floaters denies any flashes she stopped taking semaglutide shot for weight loss and felt like it was effecting her vision she is not currently checking her blood sugar       Last edited by Rennis Chris, MD on 09/10/2023  5:17 PM.    Pt states vision is better, she states she took [facility] for 2 months and her vision got "wonky", so she stopped it and her vision got better  Referring physician: Donita Brooks, MD [address] [place],  [place] [address]  HISTORICAL INFORMATION:   Selected notes from the MEDICAL RECORD NUMBER Referred by Dr. Baker Pierini for concern of exu ARMD OU   CURRENT MEDICATIONS: No current outpatient medications on file. (Ophthalmic Drugs)   No current facility-administered medications for this visit. (Ophthalmic Drugs)   Current Outpatient Medications (Other)  Medication Sig   albuterol (PROVENTIL HFA;VENTOLIN HFA) 108 (90 Base) MCG/ACT inhaler Inhale 2 puffs into the lungs every 4 (four) hours as needed for wheezing or shortness of breath.   ALPRAZolam (XANAX) 0.5 MG tablet Take 1 tablet (0.5 mg total) by mouth 3 (three) times daily as needed for anxiety.   celecoxib (CELEBREX) 200 MG capsule TAKE 1 CAPSULE BY MOUTH EVERY DAY (Patient taking differently: Take 200 mg by mouth every other day.)   docusate sodium (COLACE) 100 MG capsule Take 1 capsule (100 mg total) by mouth  2 (two) times daily.   gabapentin (NEURONTIN) 300 MG capsule Take 1 capsule by mouth three times a day.   ipratropium (ATROVENT) 0.06 % nasal spray SPRAY 2 SPRAYS INTO EACH NOSTRIL 4 TIMES A DAY.   levocetirizine (XYZAL) 5 MG tablet TAKE 1 TABLET BY MOUTH EVERY DAY IN THE EVENING   mometasone (ELOCON) 0.1 % cream APPLY TO AFFECTED AREA TOPICALLY EVERY DAY   Multiple Vitamins-Minerals (EQ VISION FORMULA 50+ PO) Take 1 capsule by mouth daily.   oxyCODONE (OXY IR/ROXICODONE) 5 MG immediate release tablet Take 1 tablet (5 mg total) by mouth every 4 (four) hours as needed for moderate pain (pain score 4-6).   oxyCODONE-acetaminophen (PERCOCET) 10-325 MG tablet Take 1 tablet by mouth every 8 (eight) hours as needed for pain.   pantoprazole (PROTONIX) 40 MG tablet Take 1 tablet (40 mg total) by mouth daily. Courtesy refill. Annual needed for future refills.   Semaglutide-Weight Management (WEGOVY) 0.5 MG/0.5ML SOAJ Inject 0.5 mg into the skin once a week.   sertraline (ZOLOFT) 50 MG tablet TAKE 1 AND 1/2 TABLETS BY MOUTH DAILY   telmisartan-hydrochlorothiazide (MICARDIS HCT) 80-12.5 MG tablet Take 1 tablet by mouth daily.   traZODone (DESYREL) 50 MG tablet TAKE 1 TABLET BY MOUTH EVERYDAY AT BEDTIME   valACYclovir (VALTREX) 1000 MG tablet Take 2 tablets (2,000 mg total) by mouth 2 (two)  times daily.   zolpidem (AMBIEN) 10 MG tablet TAKE 1 TABLET BY MOUTH EVERY DAY AT BEDTIME AS NEEDED FOR SLEEP   No current facility-administered medications for this visit. (Other)   REVIEW OF SYSTEMS: ROS   Positive for: Gastrointestinal, Neurological, Eyes Negative for: Constitutional, Skin, Genitourinary, Musculoskeletal, HENT, Endocrine, Cardiovascular, Respiratory, Psychiatric, Allergic/Imm, Heme/Lymph Last edited by Etheleen Mayhew, COT on 09/10/2023  1:49 PM.     ALLERGIES Allergies  Allergen Reactions   Cymbalta [Duloxetine Hcl] Swelling   PAST MEDICAL HISTORY Past Medical History:  Diagnosis  Date   Allergy    Anemia    Anxiety    Arthritis    Blood transfusion without reported diagnosis    Cataract    GERD (gastroesophageal reflux disease)    Hypertension    Hypertensive retinopathy    OU   Macular degeneration    Past Surgical History:  Procedure Laterality Date   ABDOMINAL HYSTERECTOMY     ACHILLES TENDON REPAIR Bilateral    APPENDECTOMY     BACK SURGERY     BICEPT TENODESIS Right 04/15/2023   Procedure: RIGHT BICEPS TENODESIS;  Surgeon: Cammy Copa, MD;  Location: [facility] OR;  Service: Orthopedics;  Laterality: Right;   BILATERAL CARPAL TUNNEL RELEASE     CHOLECYSTECTOMY     COLONOSCOPY     EYE SURGERY Right 09/18/2022   cataract extraction   HERNIA REPAIR     JOINT REPLACEMENT     MELANOMA EXCISION Right 09/15/2017   Procedure: EXCISION LIPOSARCOMA RIGHT ARM;  Surgeon: Violeta Gelinas, MD;  Location: [facility] OR;  Service: General;  Laterality: Right;   REVERSE SHOULDER ARTHROPLASTY Right 04/15/2023   Procedure: RIGHT REVERSE SHOULDER ARTHROPLASTY;  Surgeon: Cammy Copa, MD;  Location: [facility] OR;  Service: Orthopedics;  Laterality: Right;   SPINE SURGERY     FAMILY HISTORY Family History  Problem Relation Age of Onset   Hypertension Mother    Cancer Sister    Stroke Maternal Grandmother    SOCIAL HISTORY Social History   Tobacco Use   Smoking status: Never   Smokeless tobacco: Never  Vaping Use   Vaping status: Never Used  Substance Use Topics   Alcohol use: Yes    Alcohol/week: 1.0 standard drink of alcohol    Types: 1 Glasses of wine per week   Drug use: No       OPHTHALMIC EXAM: Base Eye Exam     Visual Acuity (Snellen - Linear)       Right Left   Dist cc 20/50 20/20   Dist ph cc 20/30 -2     Correction: Glasses         Tonometry (Tonopen, 1:56 PM)       Right Left   Pressure 16 17         Pupils       Pupils Dark Light Shape React APD   Right PERRL 3 2 Round Brisk None   Left PERRL 3 2 Round Brisk None          Visual Fields       Left Right    Full Full         Extraocular Movement       Right Left    Full, Ortho Full, Ortho         Neuro/Psych     Oriented x3: Yes   Mood/Affect: Normal         Dilation     Both eyes: 2.5%  Phenylephrine @ 1:56 PM           Slit Lamp and Fundus Exam     Slit Lamp Exam       Right Left   Lids/Lashes Dermatochalasis - upper lid, mild Meibomian gland dysfunction Dermatochalasis - upper lid, mild Meibomian gland dysfunction   Conjunctiva/Sclera White and quiet White and quiet   Cornea 2-3+ fine Punctate epithelial erosions 2+ fine Punctate epithelial erosions inferiorly   Anterior Chamber deep, clear, narrow temporal angle deep, clear, narrow temporal angle   Iris Round and dilated Round and dilated   Lens PCIOL in good position 2-3+ Nuclear sclerosis, 2-3+ Cortical cataract   Anterior Vitreous Vitreous syneresis, Posterior vitreous detachment, prominent vitreous condensation now nasal to disc Vitreous syneresis, Posterior vitreous detachment         Fundus Exam       Right Left   Disc Pink and Sharp, Compact, +PPA Pink and Sharp, temporal PPP, Compact   C/D Ratio 0.2 0.1   Macula Blunted foveal reflex, drusen, RPE mottling, clumping and atrophy, +CNV, Stable resolution of central SRF, No heme, persistent low PED Flat, good foveal reflex, scattered Drusen, RPE mottling and clumping, focal PEDs, No heme or edema   Vessels attenuated, Tortuous attenuated, Tortuous   Periphery Attached, mild reticular degeneration, no heme Attached, no heme           Refraction     Wearing Rx       Sphere Cylinder Axis Add   Right +2.00 +0.50 008 +2.50   Left +2.50 +1.00 154 +2.50    Type: PAL           IMAGING AND PROCEDURES  Imaging and Procedures for @TODAY @  OCT, Retina - OU - Both Eyes       Right Eye Quality was good. Central Foveal Thickness: 224. Progression has been stable. Findings include normal foveal contour, no IRF,  no SRF, retinal drusen , pigment epithelial detachment, outer retinal atrophy (Stable improvement in central SRF overlying low, stable PEDs, central ORA).   Left Eye Quality was good. Central Foveal Thickness: 272. Progression has been stable. Findings include normal foveal contour, no IRF, no SRF, retinal drusen .   Notes *Images captured and stored on drive  Diagnosis / Impression:  OD: stable improvement in central SRF overlying low, stable PEDs, central ORA OS: non-exu ARMD -- stable  Clinical management:  See below  Abbreviations: NFP - Normal foveal profile. CME - cystoid macular edema. PED - pigment epithelial detachment. IRF - intraretinal fluid. SRF - subretinal fluid. EZ - ellipsoid zone. ERM - epiretinal membrane. ORA - outer retinal atrophy. ORT - outer retinal tubulation. SRHM - subretinal hyper-reflective material     Intravitreal Injection, Pharmacologic Agent - OD - Right Eye       Time Out 09/10/2023. 2:26 PM. Confirmed correct patient, procedure, site, and patient consented.   Anesthesia Topical anesthesia was used. Anesthetic medications included Lidocaine 2%, Proparacaine 0.5%.   Procedure Preparation included 5% betadine to ocular surface, eyelid speculum. A (32g) needle was used.   Injection: 6 mg faricimab-svoa 6 MG/0.05ML   Route: Intravitreal, Site: Right Eye   NDC: [id number], Lot: [id number], Expiration date: 02/01/2025, Waste: 0 mL   Post-op Post injection exam found visual acuity of at least counting fingers. The patient tolerated the procedure well. There were no complications. The patient received written and verbal post procedure care education. Post injection medications were not given.  ASSESSMENT/PLAN:    ICD-10-CM   1. Exudative age-related macular degeneration of right eye with active choroidal neovascularization (HCC)  H35.3211 OCT, Retina - OU - Both Eyes    Intravitreal Injection, Pharmacologic Agent - OD - Right Eye     faricimab-svoa (VABYSMO) 6mg /0.50mL intravitreal injection    2. Intermediate stage nonexudative age-related macular degeneration of left eye  H35.3122     3. Essential hypertension  I10     4. Hypertensive retinopathy of both eyes  H35.033     5. Combined forms of age-related cataract of left eye  H25.812     6. Pseudophakia  Z96.1     7. Glaucoma suspect of both eyes  H40.003     8. Dry eyes  H04.123      1. Exudative age related macular degeneration, right eye  - FA (03.10.20) shows +CNV w/ staining/leakage  - repeat FA 09.13.21 shows interval increase in perifoveal leakage OD - s/p IVA OD #1 (03.10.20), #2 (04.13.20), #3 (05.11.20), #4 (06.23.20), #5 (8.18.20), #6 (10.27.20), #7 (01.26.21), #8 (04.26.21), #9 (08.02.21), #10 (09.13.21), #11 (10.11.21), #12 (11.9.21), #13 (12.14.21), #14 (1.24.22), #15 (3.15.22), #16 (05.24.22), #17 (8.9.22), #18 (10.18.22), #19 (01.03.23), #20 (03.29.23) -- IVA resistance ========================================================= - s/p IVE OD #1 (06.07.23), #2 (07.19.23), #3 (09.01.23), #4 (10.13.23), #5 (11.10.23), #6 (12.22.23) -- IVE resistance =========================================================  - s/p IVV OD #1 (02.02.24), #2 (03.15.24), #3 (05.01.24), #4 (06.05.24), #5 (07.17.24), #6 (09.11.24) - pt was doing very well with q3 mo maintenance injections, but then developed interval increases in central SRF over several visits -- IVA and IVE resistance  - BCVA OD 20/30 -- stable - OCT today stable improvement in central SRF overlying low, stable PEDs at 8 weeks   - recommend IVV OD #7 today, 10.06.24 w/ f/u ext to 8-9 wks  - pt wishes to be treated with IVV OD  - RBA of procedure discussed, questions answered  - IVV informed consent obtained and signed, 02.02.24 (OD)  - see procedure note  - f/u in 8-9 wks -- DFE/OCT/possible injection  2. Age related macular degeneration, non-exudative, left eye - The incidence, anatomy, and pathology  of dry AMD, risk of progression, and the AREDS and AREDS 2 study including smoking risks discussed with patient.  - intermediate stage  - recommend amsler grid monitoring  3,4. Hypertensive retinopathy OU  - discussed importance of tight BP control  - monitor  5. Mixed form age related cataracts OS - The symptoms of cataract, surgical options, and treatments and risks were discussed with patient  - under the expert management of Dr. Zenaida Niece  6. Pseudophakia OD  - s/p CE/IOL (Dr. Zenaida Niece, 11.15.23)  - IOL in good position, doing well  - monitor  7. Glaucoma Suspect OU  - IOP 16,17  - under the expert management of Dr. Zenaida Niece  8. Dry eyes OU  - decreased vision OD today mostly due to increased PEE / dry eyes  - pt reports stopping ATs after hearing about product recalls on ATs - recommend artificial tears and lubricating ointment as needed   Return for f/u 8-9 weeks, exu ARMD OD, DFE, OCT. '  Ophthalmic Meds Ordered this visit:  Meds ordered this encounter  Medications   faricimab-svoa (VABYSMO) 6mg /0.9mL intravitreal injection      This document serves as a record of services personally performed by Karie Chimera, MD, PhD. It was created on their behalf by De Blanch, an ophthalmic technician. The creation of this record is  the provider's dictation and/or activities during the visit.    Electronically signed by: De Blanch, OA, 09/10/23  8:43 PM  This document serves as a record of services personally performed by Karie Chimera, MD, PhD. It was created on their behalf by Glee Arvin. Manson Passey, OA an ophthalmic technician. The creation of this record is the provider's dictation and/or activities during the visit.    Electronically signed by: Glee Arvin. Manson Passey, OA 09/10/23 8:43 PM  Karie Chimera, M.D., Ph.D. Diseases & Surgery of the Retina and Vitreous [facility]  I have reviewed the above documentation for accuracy and completeness, and I agree  with the above. Karie Chimera, M.D., Ph.D. 09/10/23 8:45 PM   Abbreviations: M myopia (nearsighted); A astigmatism; H hyperopia (farsighted); P presbyopia; Mrx spectacle prescription;  CTL contact lenses; OD right eye; OS left eye; OU both eyes  XT exotropia; ET esotropia; PEK punctate epithelial keratitis; PEE punctate epithelial erosions; DES dry eye syndrome; MGD meibomian gland dysfunction; ATs artificial tears; PFAT's preservative free artificial tears; NSC nuclear sclerotic cataract; PSC posterior subcapsular cataract; ERM epi-retinal membrane; PVD posterior vitreous detachment; RD retinal detachment; DM diabetes mellitus; DR diabetic retinopathy; NPDR non-proliferative diabetic retinopathy; PDR proliferative diabetic retinopathy; CSME clinically significant macular edema; DME diabetic macular edema; dbh dot blot hemorrhages; CWS cotton wool spot; POAG primary open angle glaucoma; C/D cup-to-disc ratio; HVF humphrey visual field; GVF goldmann visual field; OCT optical coherence tomography; IOP intraocular pressure; BRVO Branch retinal vein occlusion; CRVO central retinal vein occlusion; CRAO central retinal artery occlusion; BRAO branch retinal artery occlusion; RT retinal tear; SB scleral buckle; PPV pars plana vitrectomy; VH Vitreous hemorrhage; PRP panretinal laser photocoagulation; IVK intravitreal kenalog; VMT vitreomacular traction; MH Macular hole;  NVD neovascularization of the disc; NVE neovascularization elsewhere; AREDS age related eye disease study; ARMD age related macular degeneration; POAG primary open angle glaucoma; EBMD epithelial/anterior basement membrane dystrophy; ACIOL anterior chamber intraocular lens; IOL intraocular lens; PCIOL posterior chamber intraocular lens; Phaco/IOL phacoemulsification with intraocular lens placement; PRK photorefractive keratectomy; LASIK laser assisted in situ keratomileusis; HTN hypertension; DM diabetes mellitus; COPD chronic obstructive pulmonary  disease

## 2023-09-08 ENCOUNTER — Other Ambulatory Visit: Payer: Self-pay | Admitting: Family Medicine

## 2023-09-08 DIAGNOSIS — L299 Pruritus, unspecified: Secondary | ICD-10-CM

## 2023-09-08 NOTE — Telephone Encounter (Signed)
Prescription Request  09/08/2023  LOV: 07/25/2023  What is the name of the medication or equipment?   mometasone (ELOCON) 0.1 % cream   Have you contacted your pharmacy to request a refill? Yes   Which pharmacy would you like this sent to?  [id number] [place], [place] - [address] [place] [address] Phone: [phone] Fax: [phone]    Patient notified that their request is being sent to the clinical staff for review and that they should receive a response within 2 business days.   Please advise pharmacist.

## 2023-09-09 NOTE — Telephone Encounter (Signed)
Requested medication (s) are due for refill today: Yes  Requested medication (s) are on the active medication list: Yes  Last refill:  04/04/23  Future visit scheduled: No  Notes to clinic:  Unable to refill per protocol, medication not assigned to the refill protocol.      Requested Prescriptions  Pending Prescriptions Disp Refills   mometasone (ELOCON) 0.1 % cream 45 g 2     Off-Protocol Failed - 09/08/2023  9:35 AM      Failed - Medication not assigned to a protocol, review manually.      Failed - Valid encounter within last 12 months    Recent Outpatient Visits           1 year ago Benign essential HTN   [facility] Tanya Nones Priscille Heidelberg, MD   2 years ago Benign essential HTN   [facility] Tanya Nones, Priscille Heidelberg, MD   3 years ago Benign essential HTN   [facility] Donita Brooks, MD   3 years ago Acute idiopathic gout of right wrist   [facility] Pickard, Priscille Heidelberg, MD   3 years ago General medical exam   [facility] Pickard, Priscille Heidelberg, MD

## 2023-09-10 ENCOUNTER — Encounter (INDEPENDENT_AMBULATORY_CARE_PROVIDER_SITE_OTHER): Payer: Self-pay | Admitting: Ophthalmology

## 2023-09-10 ENCOUNTER — Ambulatory Visit (INDEPENDENT_AMBULATORY_CARE_PROVIDER_SITE_OTHER): Payer: Medicare Other | Admitting: Ophthalmology

## 2023-09-10 DIAGNOSIS — H353211 Exudative age-related macular degeneration, right eye, with active choroidal neovascularization: Secondary | ICD-10-CM

## 2023-09-10 DIAGNOSIS — H04123 Dry eye syndrome of bilateral lacrimal glands: Secondary | ICD-10-CM

## 2023-09-10 DIAGNOSIS — I10 Essential (primary) hypertension: Secondary | ICD-10-CM

## 2023-09-10 DIAGNOSIS — H25812 Combined forms of age-related cataract, left eye: Secondary | ICD-10-CM | POA: Diagnosis not present

## 2023-09-10 DIAGNOSIS — H35033 Hypertensive retinopathy, bilateral: Secondary | ICD-10-CM | POA: Diagnosis not present

## 2023-09-10 DIAGNOSIS — H40003 Preglaucoma, unspecified, bilateral: Secondary | ICD-10-CM | POA: Diagnosis not present

## 2023-09-10 DIAGNOSIS — Z961 Presence of intraocular lens: Secondary | ICD-10-CM | POA: Diagnosis not present

## 2023-09-10 DIAGNOSIS — H353122 Nonexudative age-related macular degeneration, left eye, intermediate dry stage: Secondary | ICD-10-CM

## 2023-09-10 MED ORDER — FARICIMAB-SVOA 6 MG/0.05ML IZ SOSY
6.0000 mg | PREFILLED_SYRINGE | INTRAVITREAL | Status: AC | PRN
  Administered 2023-09-10: 6 mg via INTRAVITREAL

## 2023-09-12 ENCOUNTER — Telehealth: Payer: Self-pay

## 2023-09-12 ENCOUNTER — Other Ambulatory Visit: Payer: Self-pay

## 2023-09-12 DIAGNOSIS — L299 Pruritus, unspecified: Secondary | ICD-10-CM

## 2023-09-12 MED ORDER — MOMETASONE FUROATE 0.1 % EX CREA: TOPICAL_CREAM | Freq: Every day | CUTANEOUS | 2 refills | Status: DC

## 2023-09-12 NOTE — Telephone Encounter (Signed)
Prescription Request  09/12/2023  LOV: 05/16/23  What is the name of the medication or equipment? mometasone (ELOCON) 0.1 % cream [[id number]]  Have you contacted your pharmacy to request a refill? Yes   Which pharmacy would you like this sent to?  [id number] [place], [place] - [address] [place] [address] Phone: [phone] Fax: [phone]    Patient notified that their request is being sent to the clinical staff for review and that they should receive a response within 2 business days.   Please advise at [facility] [phone]

## 2023-09-15 ENCOUNTER — Other Ambulatory Visit: Payer: Self-pay | Admitting: Family Medicine

## 2023-09-15 DIAGNOSIS — T7840XD Allergy, unspecified, subsequent encounter: Secondary | ICD-10-CM

## 2023-09-16 NOTE — Telephone Encounter (Signed)
Requested Prescriptions  Refused Prescriptions Disp Refills   ipratropium (ATROVENT) 0.06 % nasal spray [Pharmacy Med Name: IPRATROPIUM 0.06% SPRAY]  1    Sig: SPRAY 2 SPRAYS INTO EACH NOSTRIL 4 TIMES A DAY.     Off-Protocol Failed - 09/15/2023  2:30 PM      Failed - Medication not assigned to a protocol, review manually.      Failed - Valid encounter within last 12 months    Recent Outpatient Visits           1 year ago Benign essential HTN   [facility] Tanya Nones, Priscille Heidelberg, MD   2 years ago Benign essential HTN   [facility] Tanya Nones, Priscille Heidelberg, MD   3 years ago Benign essential HTN   [facility] Tanya Nones Priscille Heidelberg, MD   3 years ago Acute idiopathic gout of right wrist   [facility] Pickard, Priscille Heidelberg, MD   3 years ago General medical exam   [facility] Donita Brooks, MD             Off-Protocol Failed - 09/15/2023  2:30 PM      Failed - Medication not assigned to a protocol, review manually.      Failed - Valid encounter within last 12 months    Recent Outpatient Visits           1 year ago Benign essential HTN   [facility] Tanya Nones Priscille Heidelberg, MD   2 years ago Benign essential HTN   [facility] Tanya Nones, Priscille Heidelberg, MD   3 years ago Benign essential HTN   [facility] Donita Brooks, MD   3 years ago Acute idiopathic gout of right wrist   [facility] Pickard, Priscille Heidelberg, MD   3 years ago General medical exam   [facility] Pickard, Priscille Heidelberg, MD

## 2023-09-17 ENCOUNTER — Telehealth: Payer: Self-pay

## 2023-09-17 NOTE — Telephone Encounter (Signed)
Copied from CRM [id number]. Topic: Clinical - Medication Refill >> Sep 17, 2023  9:10 AM Dollene Primrose wrote: Most Recent Primary Care Visit:  Provider: Lynnea Ferrier T  Department: [facility]  Visit Type: SAME DAY  Date: 07/25/2023  Medication:  oxyCODONE-acetaminophen (PERCOCET) 10-325 MG tablet    Has the patient contacted their pharmacy? Yes-c2 (Agent: If no, request that the patient contact the pharmacy for the refill. If patient does not wish to contact the pharmacy document the reason why and proceed with request.) (Agent: If yes, when and what did the pharmacy advise?)  Is this the correct pharmacy for this prescription? yes If no, delete pharmacy and type the correct one.  This is the patient's preferred pharmacy:  [id number] [place], [place] - [address] [place] [address] Phone: [phone] Fax: [phone]   Has the prescription been filled recently? yes  Is the patient out of the medication? yes  Has the patient been seen for an appointment in the last year OR does the patient have an upcoming appointment? yes  Can we respond through MyChart? yes  Agent: Please be advised that Rx refills may take up to 3 business days. We ask that you follow-up with your pharmacy.

## 2023-09-18 ENCOUNTER — Other Ambulatory Visit: Payer: Self-pay | Admitting: Family Medicine

## 2023-09-18 MED ORDER — OXYCODONE HCL 5 MG PO TABS: 5.0000 mg | ORAL_TABLET | ORAL | 0 refills | Status: DC | PRN

## 2023-09-19 ENCOUNTER — Telehealth: Payer: Self-pay

## 2023-09-19 NOTE — Telephone Encounter (Signed)
LM for pt to call office to clarify. Mjp,lpn  Copied from CRM [id number]. Topic: Clinical - Prescription Issue >> Sep 18, 2023  2:42 PM Tiffany H wrote: Reason for CRM: Patient's husband Chianne Best called to advise that patient's Oxycodone prescription was prescribed at 5mg  when patient was told she needed 10mg . Please assist.

## 2023-09-22 ENCOUNTER — Telehealth: Payer: Self-pay

## 2023-09-22 NOTE — Telephone Encounter (Signed)
Pt states she is taking 10 mg of the Oxycodone but I couldn't find any documentation regarding the increase. Thank you.   Copied from CRM [id number]. Topic: General - Other >> Sep 22, 2023 11:53 AM Raven B wrote: Reason for CRM: PT returning missed call from Germaine Pomfret to advise she normally takes 10 MG prescribed by Dr.Pickard however, prescription that was sent to pharmacy was 5 MG. PT will not be picking up 5 MG as she needs it to be 10 MG. Call back # [phone]

## 2023-09-23 ENCOUNTER — Other Ambulatory Visit: Payer: Self-pay | Admitting: Family Medicine

## 2023-09-23 ENCOUNTER — Other Ambulatory Visit: Payer: Self-pay

## 2023-09-23 ENCOUNTER — Telehealth: Payer: Self-pay

## 2023-09-23 DIAGNOSIS — I10 Essential (primary) hypertension: Secondary | ICD-10-CM

## 2023-09-23 MED ORDER — TELMISARTAN-HCTZ 80-12.5 MG PO TABS: 1.0000 | ORAL_TABLET | Freq: Every day | ORAL | 1 refills | Status: DC

## 2023-09-23 MED ORDER — OXYCODONE-ACETAMINOPHEN 10-325 MG PO TABS: 1.0000 | ORAL_TABLET | Freq: Three times a day (TID) | ORAL | 0 refills | Status: DC | PRN

## 2023-09-23 NOTE — Telephone Encounter (Signed)
Prescription Request  09/23/2023  LOV: 07/25/23  What is the name of the medication or equipment? telmisartan-hydrochlorothiazide (MICARDIS HCT) 80-12.5 MG tablet [[id number]]   Have you contacted your pharmacy to request a refill? Yes   Which pharmacy would you like this sent to?  [id number] [place], [place] - [address] [place] [address] Phone: [phone] Fax: [phone]    Patient notified that their request is being sent to the clinical staff for review and that they should receive a response within 2 business days.   Please advise at [facility] [phone]

## 2023-10-01 ENCOUNTER — Other Ambulatory Visit: Payer: Self-pay | Admitting: Family Medicine

## 2023-10-01 DIAGNOSIS — T7840XD Allergy, unspecified, subsequent encounter: Secondary | ICD-10-CM

## 2023-10-01 NOTE — Telephone Encounter (Signed)
Requested medication (s) are due for refill today:   Yes  Requested medication (s) are on the active medication list:   Yes  Future visit scheduled:   No    Last ordered: 09/04/2023 15 ml, 1 refill  Returned because there is not a protocol assigned to this.   Also a 90 day supply and DX Code are being requested.     Requested Prescriptions  Pending Prescriptions Disp Refills   ipratropium (ATROVENT) 0.06 % nasal spray [Pharmacy Med Name: IPRATROPIUM 0.06% SPRAY]  1    Sig: SPRAY 2 SPRAYS INTO EACH NOSTRIL 4 TIMES A DAY.     Off-Protocol Failed - 10/01/2023  8:32 AM      Failed - Medication not assigned to a protocol, review manually.      Failed - Valid encounter within last 12 months    Recent Outpatient Visits           1 year ago Benign essential HTN   [facility] Tanya Nones, Priscille Heidelberg, MD   2 years ago Benign essential HTN   [facility] Tanya Nones, Priscille Heidelberg, MD   3 years ago Benign essential HTN   [facility] Tanya Nones Priscille Heidelberg, MD   3 years ago Acute idiopathic gout of right wrist   [facility] Pickard, Priscille Heidelberg, MD   3 years ago General medical exam   [facility] Donita Brooks, MD             Off-Protocol Failed - 10/01/2023  8:32 AM      Failed - Medication not assigned to a protocol, review manually.      Failed - Valid encounter within last 12 months    Recent Outpatient Visits           1 year ago Benign essential HTN   [facility] Tanya Nones Priscille Heidelberg, MD   2 years ago Benign essential HTN   [facility] Tanya Nones, Priscille Heidelberg, MD   3 years ago Benign essential HTN   [facility] Donita Brooks, MD   3 years ago Acute idiopathic gout of right wrist   [facility] Pickard, Priscille Heidelberg, MD   3 years ago General medical exam   [facility] Pickard, Priscille Heidelberg, MD

## 2023-10-15 ENCOUNTER — Telehealth: Payer: Self-pay | Admitting: *Deleted

## 2023-10-15 NOTE — Telephone Encounter (Signed)
Left a message that visit on 12-12 can be done by video or telephone , and patient does not have to come in office

## 2023-10-16 ENCOUNTER — Other Ambulatory Visit: Payer: Self-pay

## 2023-10-16 ENCOUNTER — Ambulatory Visit: Payer: Medicare Other

## 2023-10-16 DIAGNOSIS — Z0000 Encounter for general adult medical examination without abnormal findings: Secondary | ICD-10-CM

## 2023-10-16 DIAGNOSIS — I10 Essential (primary) hypertension: Secondary | ICD-10-CM

## 2023-10-16 MED ORDER — OXYCODONE-ACETAMINOPHEN 10-325 MG PO TABS: 1.0000 | ORAL_TABLET | Freq: Three times a day (TID) | ORAL | 0 refills | Status: AC | PRN

## 2023-10-16 MED ORDER — TELMISARTAN-HCTZ 80-12.5 MG PO TABS: 1.0000 | ORAL_TABLET | Freq: Every day | ORAL | 1 refills | Status: DC

## 2023-10-16 NOTE — Progress Notes (Signed)
Subjective:   Faith Reeves is a 72 y.o. female who presents for Medicare Annual (Subsequent) preventive examination.  Visit Complete: Virtual I connected with  Faith Reeves on 10/16/23 by a audio enabled telemedicine application and verified that I am speaking with the correct person using two identifiers.  Patient Location: Home  Provider Location: Home Office  I discussed the limitations of evaluation and management by telemedicine. The patient expressed understanding and agreed to proceed.  Vital Signs: Because this visit was a virtual/telehealth visit, some criteria may be missing or patient reported. Any vitals not documented were not able to be obtained and vitals that have been documented are patient reported.        Objective:    Today's Vitals   10/16/23 1134  PainSc: 6    There is no height or weight on file to calculate BMI.     10/16/2023   11:37 AM 05/01/2023    1:01 PM 04/15/2023   10:29 AM 04/08/2023    1:08 PM 10/10/2022   12:22 PM 03/28/2022    1:46 PM 04/27/2021    2:21 PM  Advanced Directives  Does Patient Have a Medical Advance Directive? No No No No No No No  Would patient like information on creating a medical advance directive? No - Patient declined No - Patient declined No - Patient declined No - Patient declined Yes (MAU/Ambulatory/Procedural Areas - Information given) No - Patient declined No - Patient declined    Current Medications (verified) Outpatient Encounter Medications as of 10/16/2023  Medication Sig   albuterol (PROVENTIL HFA;VENTOLIN HFA) 108 (90 Base) MCG/ACT inhaler Inhale 2 puffs into the lungs every 4 (four) hours as needed for wheezing or shortness of breath.   ALPRAZolam (XANAX) 0.5 MG tablet Take 1 tablet (0.5 mg total) by mouth 3 (three) times daily as needed for anxiety.   celecoxib (CELEBREX) 200 MG capsule TAKE 1 CAPSULE BY MOUTH EVERY DAY (Patient taking differently: Take 200 mg by mouth every other day.)   docusate sodium  (COLACE) 100 MG capsule Take 1 capsule (100 mg total) by mouth 2 (two) times daily.   gabapentin (NEURONTIN) 300 MG capsule Take 1 capsule by mouth three times a day.   ipratropium (ATROVENT) 0.06 % nasal spray SPRAY 2 SPRAYS INTO EACH NOSTRIL 4 TIMES A DAY.   levocetirizine (XYZAL) 5 MG tablet TAKE 1 TABLET BY MOUTH EVERY DAY IN THE EVENING   mometasone (ELOCON) 0.1 % cream Apply topically daily.   Multiple Vitamins-Minerals (EQ VISION FORMULA 50+ PO) Take 1 capsule by mouth daily.   oxyCODONE-acetaminophen (PERCOCET) 10-325 MG tablet Take 1 tablet by mouth every 8 (eight) hours as needed for pain.   pantoprazole (PROTONIX) 40 MG tablet Take 1 tablet (40 mg total) by mouth daily. Courtesy refill. Annual needed for future refills.   Semaglutide-Weight Management (WEGOVY) 0.5 MG/0.5ML SOAJ Inject 0.5 mg into the skin once a week.   sertraline (ZOLOFT) 50 MG tablet TAKE 1 AND 1/2 TABLETS BY MOUTH DAILY   telmisartan-hydrochlorothiazide (MICARDIS HCT) 80-12.5 MG tablet Take 1 tablet by mouth daily.   traZODone (DESYREL) 50 MG tablet TAKE 1 TABLET BY MOUTH EVERYDAY AT BEDTIME   valACYclovir (VALTREX) 1000 MG tablet Take 2 tablets (2,000 mg total) by mouth 2 (two) times daily.   zolpidem (AMBIEN) 10 MG tablet TAKE 1 TABLET BY MOUTH EVERY DAY AT BEDTIME AS NEEDED FOR SLEEP   No facility-administered encounter medications on file as of 10/16/2023.    Allergies (verified) Cymbalta [  duloxetine hcl]   History: Past Medical History:  Diagnosis Date   Allergy    Anemia    Anxiety    Arthritis    Blood transfusion without reported diagnosis    Cataract    GERD (gastroesophageal reflux disease)    Hypertension    Hypertensive retinopathy    OU   Macular degeneration    Past Surgical History:  Procedure Laterality Date   ABDOMINAL HYSTERECTOMY     ACHILLES TENDON REPAIR Bilateral    APPENDECTOMY     BACK SURGERY     BICEPT TENODESIS Right 04/15/2023   Procedure: RIGHT BICEPS TENODESIS;   Surgeon: Cammy Copa, MD;  Location: [facility] OR;  Service: Orthopedics;  Laterality: Right;   BILATERAL CARPAL TUNNEL RELEASE     CHOLECYSTECTOMY     COLONOSCOPY     EYE SURGERY Right 09/18/2022   cataract extraction   HERNIA REPAIR     JOINT REPLACEMENT     MELANOMA EXCISION Right 09/15/2017   Procedure: EXCISION LIPOSARCOMA RIGHT ARM;  Surgeon: Violeta Gelinas, MD;  Location: [facility] OR;  Service: General;  Laterality: Right;   REVERSE SHOULDER ARTHROPLASTY Right 04/15/2023   Procedure: RIGHT REVERSE SHOULDER ARTHROPLASTY;  Surgeon: Cammy Copa, MD;  Location: [facility] OR;  Service: Orthopedics;  Laterality: Right;   SPINE SURGERY     Family History  Problem Relation Age of Onset   Hypertension Mother    Cancer Sister    Stroke Maternal Grandmother    Social History   Socioeconomic History   Marital status: Married    Spouse name: Not on file   Number of children: Not on file   Years of education: Not on file   Highest education level: Not on file  Occupational History   Not on file  Tobacco Use   Smoking status: Never   Smokeless tobacco: Never  Vaping Use   Vaping status: Never Used  Substance and Sexual Activity   Alcohol use: Yes    Alcohol/week: 1.0 standard drink of alcohol    Types: 1 Glasses of wine per week   Drug use: No   Sexual activity: Never  Other Topics Concern   Not on file  Social History Narrative   Recent death of son; lives with husband; originally from [place]; most of family still lives in the [place]    Social Drivers of Health   Financial Resource Strain: Low Risk  (10/16/2023)   Overall Financial Resource Strain (CARDIA)    Difficulty of Paying Living Expenses: Not hard at all  Food Insecurity: No Food Insecurity (10/16/2023)   Hunger Vital Sign    Worried About Running Out of Food in the Last Year: Never true    Ran Out of Food in the Last Year: Never true  Transportation Needs: No Transportation Needs (10/16/2023)   PRAPARE -  Administrator, Civil Service (Medical): No    Lack of Transportation (Non-Medical): No  Physical Activity: Insufficiently Active (10/16/2023)   Exercise Vital Sign    Days of Exercise per Week: 3 days    Minutes of Exercise per Session: 30 min  Stress: No Stress Concern Present (10/16/2023)   [employer] of Occupational Health - Occupational Stress Questionnaire    Feeling of Stress : Not at all  Social Connections: Moderately Isolated (10/16/2023)   Social Connection and Isolation Panel [NHANES]    Frequency of Communication with Friends and Family: More than three times a week    Frequency  of Social Gatherings with Friends and Family: More than three times a week    Attends Religious Services: Never    Database administrator or Organizations: No    Attends Engineer, structural: Never    Marital Status: Married    Tobacco Counseling Counseling given: Not Answered   Clinical Intake:  Pre-visit preparation completed: Yes  Pain : 0-10 Pain Score: 6  Pain Type: Chronic pain Pain Location: Back Pain Descriptors / Indicators: Constant, Burning, Numbness Pain Onset: More than a month ago Pain Frequency: Constant Pain Relieving Factors: ocycodone  Pain Relieving Factors: ocycodone  Nutritional Risks: None Diabetes: No  How often do you need to have someone help you when you read instructions, pamphlets, or other written materials from your doctor or pharmacy?: 1 - Never  Interpreter Needed?: No  Information entered by :: Remi Haggard LPN   Activities of Daily Living    10/16/2023   11:38 AM 04/08/2023    1:12 PM  In your present state of health, do you have any difficulty performing the following activities:  Hearing? 0   Vision? 0   Difficulty concentrating or making decisions? 0   Walking or climbing stairs? 1   Dressing or bathing? 0   Doing errands, shopping? 0 0  Preparing Food and eating ? N   Using the Toilet? N   In the past six  months, have you accidently leaked urine? N   Do you have problems with loss of bowel control? N   Managing your Medications? N   Managing your Finances? N   Housekeeping or managing your Housekeeping? N     Patient Care Team: Donita Brooks, MD as PCP - General (Family Medicine)  Indicate any recent Medical Services you may have received from other than [facility] providers in the past year (date may be approximate).     Assessment:   This is a routine wellness examination for Faith Reeves.  Hearing/Vision screen Hearing Screening - Comments:: No trouble hearing Vision Screening - Comments:: Up to date  Edgardo Roys   shots   Goals Addressed             This Visit's Progress    Patient Stated       Increase physical active        Depression Screen    10/16/2023   11:43 AM 05/16/2023   11:22 AM 12/12/2022    4:42 PM 10/10/2022   12:20 PM 04/27/2021    2:22 PM 10/18/2019   10:19 AM 09/08/2018    9:25 AM  PHQ 2/9 Scores  PHQ - 2 Score 3 0 0 0 2 0 0  PHQ- 9 Score 3    2      Fall Risk    10/16/2023   11:38 AM 05/16/2023   11:22 AM 12/12/2022    4:41 PM 10/10/2022   12:16 PM 04/27/2021    2:22 PM  Fall Risk   Falls in the past year? 0 1 1 0 0  Number falls in past yr: 0 1 0 0 0  Injury with Fall? 0 0 1 0 0  Risk for fall due to :  History of fall(s);Impaired balance/gait;Orthopedic patient History of fall(s);Impaired balance/gait;Impaired vision Impaired mobility   Follow up Falls evaluation completed;Education provided;Falls prevention discussed Education provided;Falls prevention discussed;Falls evaluation completed Falls prevention discussed Falls evaluation completed;Education provided;Falls prevention discussed Falls evaluation completed;Falls prevention discussed    MEDICARE RISK AT HOME: Medicare Risk at  Home Any stairs in or around the home?: No If so, are there any without handrails?: No Home free of loose throw rugs in walkways, pet beds, electrical cords,  etc?: Yes Adequate lighting in your home to reduce risk of falls?: Yes Life alert?: No Use of a cane, walker or w/c?: Yes Grab bars in the bathroom?: Yes Shower chair or bench in shower?: Yes Elevated toilet seat or a handicapped toilet?: Yes  TIMED UP AND GO:  Was the test performed?  No    Cognitive Function:        10/16/2023   11:39 AM 10/10/2022   12:22 PM  6CIT Screen  What Year? 0 points 0 points  What month? 0 points 0 points  What time? 0 points 0 points  Count back from 20 0 points 0 points  Months in reverse 0 points 0 points  Repeat phrase 0 points 0 points  Total Score 0 points 0 points    Immunizations Immunization History  Administered Date(s) Administered   Fluad Quad(high Dose 65+) 10/18/2019, 08/01/2020, 10/16/2022   Influenza, High Dose Seasonal PF 08/20/2021   Influenza,inj,Quad PF,6+ Mos 08/06/2016, 08/18/2017, 08/31/2018   PFIZER(Purple Top)SARS-COV-2 Vaccination 09/21/2020   Pfizer Covid-19 Vaccine Bivalent Booster 78yrs & up 08/20/2021   Pfizer(Comirnaty)Fall Seasonal Vaccine 12 years and older 07/26/2023   Pneumococcal Conjugate-13 09/08/2018   Pneumococcal Polysaccharide-23 11/05/2015    TDAP status: Due, Education has been provided regarding the importance of this vaccine. Advised may receive this vaccine at local pharmacy or Health Dept. Aware to provide a copy of the vaccination record if obtained from local pharmacy or Health Dept. Verbalized acceptance and understanding.  Flu Vaccine status: Due, Education has been provided regarding the importance of this vaccine. Advised may receive this vaccine at local pharmacy or Health Dept. Aware to provide a copy of the vaccination record if obtained from local pharmacy or Health Dept. Verbalized acceptance and understanding.  Pneumococcal vaccine status: Up to date  Covid-19 vaccine status: Completed vaccines  Qualifies for Shingles Vaccine? Yes   Zostavax completed No   Shingrix Completed?:  No.    Education has been provided regarding the importance of this vaccine. Patient has been advised to call insurance company to determine out of pocket expense if they have not yet received this vaccine. Advised may also receive vaccine at local pharmacy or Health Dept. Verbalized acceptance and understanding.  Screening Tests Health Maintenance  Topic Date Due   Zoster Vaccines- Shingrix (1 of 2) 01/14/2024 (Originally 07/19/1999)   MAMMOGRAM  05/15/2024 (Originally 02/28/2023)   Medicare Annual Wellness (AWV)  10/15/2024   Colonoscopy  03/08/2032   Pneumonia Vaccine 38+ Years old  Completed   INFLUENZA VACCINE  Completed   DEXA SCAN  Completed   COVID-19 Vaccine  Completed   Hepatitis C Screening  Completed   HPV VACCINES  Aged Out   DTaP/Tdap/Td  Discontinued    Health Maintenance  There are no preventive care reminders to display for this patient.   Colorectal cancer screening: Type of screening: Colonoscopy. Completed 2023. Repeat every 10 years  Mammogram Education provided  Bone Density status: Completed 2020. Results reflect: Bone density results: OSTEOPENIA. Repeat every 5 years.  Lung Cancer Screening: (Low Dose CT Chest recommended if Age 37-80 years, 20 pack-year currently smoking OR have quit w/in 15years.) does not qualify.   Lung Cancer Screening Referral:   Additional Screening:  Hepatitis C Screening: does not qualify; Completed 2018  Vision Screening: Recommended annual ophthalmology exams  for early detection of glaucoma and other disorders of the eye. Is the patient up to date with their annual eye exam?  Yes  Who is the provider or what is the name of the office in which the patient attends annual eye exams? [facility] If pt is not established with a provider, would they like to be referred to a provider to establish care? No .   Dental Screening: Recommended annual dental exams for proper oral hygiene   Community Resource Referral / Chronic Care  Management: CRR required this visit?  No   CCM required this visit?  No     Plan:     I have personally reviewed and noted the following in the patient's chart:   Medical and social history Use of alcohol, tobacco or illicit drugs  Current medications and supplements including opioid prescriptions. Patient is currently taking opioid prescriptions. Information provided to patient regarding non-opioid alternatives. Patient advised to discuss non-opioid treatment plan with their provider. Functional ability and status Nutritional status Physical activity Advanced directives List of other physicians Hospitalizations, surgeries, and ER visits in previous 12 months Vitals Screenings to include cognitive, depression, and falls Referrals and appointments  In addition, I have reviewed and discussed with patient certain preventive protocols, quality metrics, and best practice recommendations. A written personalized care plan for preventive services as well as general preventive health recommendations were provided to patient.     Remi Haggard, LPN   [id number]/29/5621   After Visit Summary: (MyChart) Due to this being a telephonic visit, the after visit summary with patients personalized plan was offered to patient via MyChart   Nurse Notes: patient would like a refill of Oxycodone.

## 2023-10-16 NOTE — Telephone Encounter (Signed)
Copied from CRM [id number]. Topic: Clinical - Medication Refill >> Oct 16, 2023 11:44 AM Donita Brooks wrote: Most Recent Primary Care Visit:  Provider: Lynnea Ferrier T  Department: [facility]  Visit Type: SAME DAY  Date: 07/25/2023  Medication:  telmisartan-hydrochlorothiazide (MICARDIS HCT) 80-12.5 MG tablet oxyCODONE-acetaminophen (PERCOCET) 10-325 MG tablet    Has the patient contacted their pharmacy? No (Agent: If no, request that the patient contact the pharmacy for the refill. If patient does not wish to contact the pharmacy document the reason why and proceed with request.) (Agent: If yes, when and what did the pharmacy advise?)  Is this the correct pharmacy for this prescription? Yes If no, delete pharmacy and type the correct one.  This is the patient's preferred pharmacy:  [id number] [place], [place] - [address] [place] [address] Phone: [phone] Fax: [phone]   Has the prescription been filled recently? No  Is the patient out of the medication? Yes  Has the patient been seen for an appointment in the last year OR does the patient have an upcoming appointment? Yes  Can we respond through MyChart? Yes  Agent: Please be advised that Rx refills may take up to 3 business days. We ask that you follow-up with your pharmacy.

## 2023-10-16 NOTE — Patient Instructions (Signed)
Faith Reeves , Thank you for taking time to come for your Medicare Wellness Visit. I appreciate your ongoing commitment to your health goals. Please review the following plan we discussed and let me know if I can assist you in the future.   Screening recommendations/referrals: Colonoscopy: up to date Mammogram: Education provided Bone Density: Education provided Recommended yearly ophthalmology/optometry visit for glaucoma screening and checkup Recommended yearly dental visit for hygiene and checkup  Vaccinations: Influenza vaccine: up to date Pneumococcal vaccine: up to date Tdap vaccine: Education provided Shingles vaccine: Education provided      Preventive Care 65 Years and Older, Female Preventive care refers to lifestyle choices and visits with your health care provider that can promote health and wellness. What does preventive care include? A yearly physical exam. This is also called an annual well check. Dental exams once or twice a year. Routine eye exams. Ask your health care provider how often you should have your eyes checked. Personal lifestyle choices, including: Daily care of your teeth and gums. Regular physical activity. Eating a healthy diet. Avoiding tobacco and drug use. Limiting alcohol use. Practicing safe sex. Taking low-dose aspirin every day. Taking vitamin and mineral supplements as recommended by your health care provider. What happens during an annual well check? The services and screenings done by your health care provider during your annual well check will depend on your age, overall health, lifestyle risk factors, and family history of disease. Counseling  Your health care provider may ask you questions about your: Alcohol use. Tobacco use. Drug use. Emotional well-being. Home and relationship well-being. Sexual activity. Eating habits. History of falls. Memory and ability to understand (cognition). Work and work Astronomer. Reproductive  health. Screening  You may have the following tests or measurements: Height, weight, and BMI. Blood pressure. Lipid and cholesterol levels. These may be checked every 5 years, or more frequently if you are over 56 years old. Skin check. Lung cancer screening. You may have this screening every year starting at age 51 if you have a 30-pack-year history of smoking and currently smoke or have quit within the past 15 years. Fecal occult blood test (FOBT) of the stool. You may have this test every year starting at age 14. Flexible sigmoidoscopy or colonoscopy. You may have a sigmoidoscopy every 5 years or a colonoscopy every 10 years starting at age 75. Hepatitis C blood test. Hepatitis B blood test. Sexually transmitted disease (STD) testing. Diabetes screening. This is done by checking your blood sugar (glucose) after you have not eaten for a while (fasting). You may have this done every 1-3 years. Bone density scan. This is done to screen for osteoporosis. You may have this done starting at age 18. Mammogram. This may be done every 1-2 years. Talk to your health care provider about how often you should have regular mammograms. Talk with your health care provider about your test results, treatment options, and if necessary, the need for more tests. Vaccines  Your health care provider may recommend certain vaccines, such as: Influenza vaccine. This is recommended every year. Tetanus, diphtheria, and acellular pertussis (Tdap, Td) vaccine. You may need a Td booster every 10 years. Zoster vaccine. You may need this after age 51. Pneumococcal 13-valent conjugate (PCV13) vaccine. One dose is recommended after age 72. Pneumococcal polysaccharide (PPSV23) vaccine. One dose is recommended after age 17. Talk to your health care provider about which screenings and vaccines you need and how often you need them. This information is not intended  to replace advice given to you by your health care provider.  Make sure you discuss any questions you have with your health care provider. Document Released: 11/17/2015 Document Revised: 07/10/2016 Document Reviewed: 08/22/2015 Elsevier Interactive Patient Education  2017 [employer].  Fall Prevention in the Home Falls can cause injuries. They can happen to people of all ages. There are many things you can do to make your home safe and to help prevent falls. What can I do on the outside of my home? Regularly fix the edges of walkways and driveways and fix any cracks. Remove anything that might make you trip as you walk through a door, such as a raised step or threshold. Trim any bushes or trees on the path to your home. Use bright outdoor lighting. Clear any walking paths of anything that might make someone trip, such as rocks or tools. Regularly check to see if handrails are loose or broken. Make sure that both sides of any steps have handrails. Any raised decks and porches should have guardrails on the edges. Have any leaves, snow, or ice cleared regularly. Use sand or salt on walking paths during winter. Clean up any spills in your garage right away. This includes oil or grease spills. What can I do in the bathroom? Use night lights. Install grab bars by the toilet and in the tub and shower. Do not use towel bars as grab bars. Use non-skid mats or decals in the tub or shower. If you need to sit down in the shower, use a plastic, non-slip stool. Keep the floor dry. Clean up any water that spills on the floor as soon as it happens. Remove soap buildup in the tub or shower regularly. Attach bath mats securely with double-sided non-slip rug tape. Do not have throw rugs and other things on the floor that can make you trip. What can I do in the bedroom? Use night lights. Make sure that you have a light by your bed that is easy to reach. Do not use any sheets or blankets that are too big for your bed. They should not hang down onto the floor. Have a  firm chair that has side arms. You can use this for support while you get dressed. Do not have throw rugs and other things on the floor that can make you trip. What can I do in the kitchen? Clean up any spills right away. Avoid walking on wet floors. Keep items that you use a lot in easy-to-reach places. If you need to reach something above you, use a strong step stool that has a grab bar. Keep electrical cords out of the way. Do not use floor polish or wax that makes floors slippery. If you must use wax, use non-skid floor wax. Do not have throw rugs and other things on the floor that can make you trip. What can I do with my stairs? Do not leave any items on the stairs. Make sure that there are handrails on both sides of the stairs and use them. Fix handrails that are broken or loose. Make sure that handrails are as long as the stairways. Check any carpeting to make sure that it is firmly attached to the stairs. Fix any carpet that is loose or worn. Avoid having throw rugs at the top or bottom of the stairs. If you do have throw rugs, attach them to the floor with carpet tape. Make sure that you have a light switch at the top of the stairs and  the bottom of the stairs. If you do not have them, ask someone to add them for you. What else can I do to help prevent falls? Wear shoes that: Do not have high heels. Have rubber bottoms. Are comfortable and fit you well. Are closed at the toe. Do not wear sandals. If you use a stepladder: Make sure that it is fully opened. Do not climb a closed stepladder. Make sure that both sides of the stepladder are locked into place. Ask someone to hold it for you, if possible. Clearly mark and make sure that you can see: Any grab bars or handrails. First and last steps. Where the edge of each step is. Use tools that help you move around (mobility aids) if they are needed. These include: Canes. Walkers. Scooters. Crutches. Turn on the lights when you  go into a dark area. Replace any light bulbs as soon as they burn out. Set up your furniture so you have a clear path. Avoid moving your furniture around. If any of your floors are uneven, fix them. If there are any pets around you, be aware of where they are. Review your medicines with your doctor. Some medicines can make you feel dizzy. This can increase your chance of falling. Ask your doctor what other things that you can do to help prevent falls. This information is not intended to replace advice given to you by your health care provider. Make sure you discuss any questions you have with your health care provider. Document Released: 08/17/2009 Document Revised: 03/28/2016 Document Reviewed: 11/25/2014 Elsevier Interactive Patient Education  2017 [employer].

## 2023-10-17 ENCOUNTER — Other Ambulatory Visit: Payer: Self-pay | Admitting: Family Medicine

## 2023-10-22 ENCOUNTER — Other Ambulatory Visit: Payer: Self-pay | Admitting: Family Medicine

## 2023-10-22 ENCOUNTER — Other Ambulatory Visit: Payer: Self-pay

## 2023-10-22 DIAGNOSIS — M4120 Other idiopathic scoliosis, site unspecified: Secondary | ICD-10-CM

## 2023-10-22 NOTE — Telephone Encounter (Signed)
Copied from CRM [id number]. Topic: Clinical - Medication Refill >> Oct 22, 2023 12:52 PM Carlatta H wrote: Most Recent Primary Care Visit:  Provider: Laurey Arrow  Department: [facility]  Visit Type: MEDICARE AWV, SEQUENTIAL  Date: 10/16/2023  Medication: zolpidem (AMBIEN) 10 MG tablet  Has the patient contacted their pharmacy? Yes (Agent: If no, request that the patient contact the pharmacy for the refill. If patient does not wish to contact the pharmacy document the reason why and proceed with request.) (Agent: If yes, when and what did the pharmacy advise?) Automated message  Is this the correct pharmacy for this prescription? Yes If no, delete pharmacy and type the correct one.  This is the patient's preferred pharmacy:  [id number] [place], [place] - [address] [place] [address] Phone: [phone] Fax: [phone]   Has the prescription been filled recently? No  Is the patient out of the medication? Yes  Has the patient been seen for an appointment in the last year OR does the patient have an upcoming appointment? Yes  Can we respond through MyChart? Yes  Agent: Please be advised that Rx refills may take up to 3 business days. We ask that you follow-up with your pharmacy.

## 2023-10-22 NOTE — Telephone Encounter (Signed)
Prescription Request  10/22/2023  LOV: 07/25/23  What is the name of the medication or equipment? gabapentin (NEURONTIN) 300 MG capsule [[id number]]   Have you contacted your pharmacy to request a refill? Yes   Which pharmacy would you like this sent to?  [id number] [place], [place] - [address] [place] [address] Phone: [phone] Fax: [phone]    Patient notified that their request is being sent to the clinical staff for review and that they should receive a response within 2 business days.   Please advise at [facility] [phone]

## 2023-10-22 NOTE — Addendum Note (Signed)
Addended by: Wilford Corner on: 10/22/2023 04:38 PM   Modules accepted: Orders

## 2023-10-23 MED ORDER — GABAPENTIN 300 MG PO CAPS
ORAL_CAPSULE | ORAL | 5 refills | Status: DC
Start: 2023-10-23 — End: 2024-05-18

## 2023-10-23 NOTE — Telephone Encounter (Signed)
Requested Prescriptions  Pending Prescriptions Disp Refills   gabapentin (NEURONTIN) 300 MG capsule 90 capsule 5    Sig: Take 1 capsule by mouth three times a day.     Neurology: Anticonvulsants - gabapentin Failed - 10/23/2023  7:39 AM      Failed - Cr in normal range and within 360 days    Creat  Date Value Ref Range Status  07/25/2023 1.16 (H) 0.60 - 1.00 mg/dL Final         Failed - Valid encounter within last 12 months    Recent Outpatient Visits           1 year ago Benign essential HTN   [facility] Donita Brooks, MD   2 years ago Benign essential HTN   [facility] Donita Brooks, MD   3 years ago Benign essential HTN   [facility] Donita Brooks, MD   3 years ago Acute idiopathic gout of right wrist   [facility] Pickard, Priscille Heidelberg, MD   4 years ago General medical exam   [facility] Donita Brooks, MD              Passed - Completed PHQ-2 or PHQ-9 in the last 360 days

## 2023-10-24 ENCOUNTER — Telehealth: Payer: Self-pay

## 2023-10-24 ENCOUNTER — Other Ambulatory Visit: Payer: Self-pay | Admitting: Family Medicine

## 2023-10-24 MED ORDER — AZITHROMYCIN 250 MG PO TABS: ORAL_TABLET | ORAL | 0 refills | Status: DC

## 2023-10-24 NOTE — Telephone Encounter (Signed)
Copied from CRM [id number]. Topic: Appointments - Appointment Scheduling >> Oct 24, 2023  9:17 AM Dimitri Ped wrote: . Patient is wanting to get scheduled but no appointments are available . Patient is not feeling well stuffed up nose running no fever. Patient is wanting to know since there are no appointments can Dr pickard prescribe her something for the head cold

## 2023-11-04 NOTE — Progress Notes (Signed)
 [facility] - Clinic Note  11/12/2023   CHIEF COMPLAINT Patient presents for Retina Follow Up  HISTORY OF PRESENT ILLNESS: Faith Reeves is a 72 y.o. female who presents to the clinic today for:   HPI     Retina Follow Up   Patient presents with  Wet AMD.  In right eye.  Severity is moderate.  Duration of 9 weeks.  Since onset it is stable.  I, the attending physician,  performed the HPI with the patient and updated documentation appropriately.        Comments   Pt here for 9 wk ret f/u exu ARMD OD. Pt states [facility] is [facility]. Pt has been nervous/anxious w/ her husband and illness.       Last edited by Valdemar Rogue, MD on 11/12/2023  4:36 PM.     Referring physician: Duanne Butler DASEN, MD [id number] [facility] [address] [place],  [place] [address]  HISTORICAL INFORMATION:   Selected notes from the MEDICAL RECORD NUMBER Referred by Dr. Medford Ferrier for concern of exu ARMD OU   CURRENT MEDICATIONS: No current outpatient medications on file. (Ophthalmic Drugs)   No current facility-administered medications for this visit. (Ophthalmic Drugs)   Current Outpatient Medications (Other)  Medication Sig   albuterol  (PROVENTIL  HFA;VENTOLIN  HFA) 108 (90 Base) MCG/ACT inhaler Inhale 2 puffs into the lungs every 4 (four) hours as needed for wheezing or shortness of breath.   ALPRAZolam  (XANAX ) 0.5 MG tablet Take 1 tablet (0.5 mg total) by mouth 3 (three) times daily as needed for anxiety.   azithromycin  (ZITHROMAX ) 250 MG tablet 2 tabs poqday1, 1 tab poqday 2-5   celecoxib  (CELEBREX ) 200 MG capsule TAKE 1 CAPSULE BY MOUTH EVERY DAY (Patient taking differently: Take 200 mg by mouth every other day.)   docusate sodium  (COLACE) 100 MG capsule Take 1 capsule (100 mg total) by mouth 2 (two) times daily.   gabapentin  (NEURONTIN ) 300 MG capsule Take 1 capsule by mouth three times a day.   ipratropium (ATROVENT ) 0.06 % nasal spray SPRAY 2 SPRAYS INTO EACH NOSTRIL 4 TIMES A DAY.   levocetirizine  (XYZAL ) 5 MG tablet TAKE 1 TABLET BY MOUTH EVERY DAY IN THE EVENING   mometasone  (ELOCON ) 0.1 % cream Apply topically daily.   Multiple Vitamins-Minerals (EQ VISION FORMULA 50+ PO) Take 1 capsule by mouth daily.   oxyCODONE -acetaminophen  (PERCOCET) 10-325 MG tablet Take 1 tablet by mouth every 8 (eight) hours as needed for pain.   pantoprazole  (PROTONIX ) 40 MG tablet Take 1 tablet (40 mg total) by mouth daily. Courtesy refill. Annual needed for future refills.   Semaglutide -Weight Management (WEGOVY ) 0.5 MG/0.5ML SOAJ Inject 0.5 mg into the skin once a week.   sertraline  (ZOLOFT ) 50 MG tablet TAKE 1 AND 1/2 TABLETS BY MOUTH DAILY   telmisartan -hydrochlorothiazide  (MICARDIS  HCT) 80-12.5 MG tablet Take 1 tablet by mouth daily.   traZODone  (DESYREL ) 50 MG tablet TAKE 1 TABLET BY MOUTH EVERYDAY AT BEDTIME   valACYclovir  (VALTREX ) 1000 MG tablet Take 2 tablets (2,000 mg total) by mouth 2 (two) times daily.   zolpidem  (AMBIEN ) 10 MG tablet TAKE 1 TABLET BY MOUTH EVERY DAY AT BEDTIME AS NEEDED FOR SLEEP   No current facility-administered medications for this visit. (Other)   REVIEW OF SYSTEMS: ROS   Positive for: Gastrointestinal, Neurological, Eyes Negative for: Constitutional, Skin, Genitourinary, Musculoskeletal, HENT, Endocrine, Cardiovascular, Respiratory, Psychiatric, Allergic/Imm, Heme/Lymph Last edited by Antonetta Almetta BRAVO, COT on 11/12/2023  1:26 PM.  ALLERGIES Allergies  Allergen Reactions   Cymbalta [Duloxetine Hcl] Swelling   PAST MEDICAL HISTORY Past Medical History:  Diagnosis Date   Allergy    Anemia    Anxiety    Arthritis    Blood transfusion without reported diagnosis    Cataract    GERD (gastroesophageal reflux disease)    Hypertension    Hypertensive retinopathy    OU   Macular degeneration    Past Surgical History:  Procedure Laterality Date   ABDOMINAL HYSTERECTOMY     ACHILLES TENDON REPAIR Bilateral    APPENDECTOMY     BACK SURGERY     BICEPT  TENODESIS Right 04/15/2023   Procedure: RIGHT BICEPS TENODESIS;  Surgeon: Addie Cordella Hamilton, MD;  Location: [facility] OR;  Service: Orthopedics;  Laterality: Right;   BILATERAL CARPAL TUNNEL RELEASE     CHOLECYSTECTOMY     COLONOSCOPY     EYE SURGERY Right 09/18/2022   cataract extraction   HERNIA REPAIR     JOINT REPLACEMENT     MELANOMA EXCISION Right 09/15/2017   Procedure: EXCISION LIPOSARCOMA RIGHT ARM;  Surgeon: Sebastian Moles, MD;  Location[facility] OR;  Service: General;  Laterality: Right;   REVERSE SHOULDER ARTHROPLASTY Right 04/15/2023   Procedure: RIGHT REVERSE SHOULDER ARTHROPLASTY;  Surgeon: Addie Cordella Hamilton, MD;  Location: [facility] OR;  Service: Orthopedics;  Laterality: Right;   SPINE SURGERY     FAMILY HISTORY Family History  Problem Relation Age of Onset   Hypertension Mother    Cancer Sister    Stroke Maternal Grandmother    SOCIAL HISTORY Social History   Tobacco Use   Smoking status: Never   Smokeless tobacco: Never  Vaping Use   Vaping status: Never Used  Substance Use Topics   Alcohol use: Yes    Alcohol/week: 1.0 standard drink of alcohol    Types: 1 Glasses of wine per week   Drug use: No       OPHTHALMIC EXAM: Base Eye Exam     Visual Acuity (Snellen - Linear)       Right Left   Dist cc 20/30 -2 20/20 -2   Dist ph cc 20/30 +2     Correction: Glasses         Tonometry (Tonopen, 1:33 PM)       Right Left   Pressure 15 15         Pupils       Pupils Dark Light Shape React APD   Right PERRL 3 2 Round Brisk None   Left PERRL 3 2 Round Brisk None         Visual Fields (Counting fingers)       Left Right    Full Full         Extraocular Movement       Right Left    Full, Ortho Full, Ortho         Neuro/Psych     Oriented x3: Yes   Mood/Affect: Normal         Dilation     Both eyes: 1.0% Mydriacyl, 2.5% Phenylephrine  @ 1:34 PM           Slit Lamp and Fundus Exam     Slit Lamp Exam       Right Left    Lids/Lashes Dermatochalasis - upper lid, mild Meibomian gland dysfunction Dermatochalasis - upper lid, mild Meibomian gland dysfunction   Conjunctiva/Sclera White and quiet White and quiet   Cornea 2-3+ fine Punctate  epithelial erosions 2+ fine Punctate epithelial erosions inferiorly   Anterior Chamber deep, clear, narrow temporal angle deep, clear, narrow temporal angle   Iris Round and dilated Round and dilated   Lens PCIOL in good position 2-3+ Nuclear sclerosis, 2-3+ Cortical cataract   Anterior Vitreous Vitreous syneresis, Posterior vitreous detachment, prominent vitreous condensation now nasal to disc Vitreous syneresis, Posterior vitreous detachment         Fundus Exam       Right Left   Disc Pink and Sharp, Compact, +PPA Pink and Sharp, temporal PPP, Compact   C/D Ratio 0.2 0.1   Macula Blunted foveal reflex, drusen, RPE mottling, clumping and atrophy, +CNV, Stable resolution of central SRF, No heme, persistent low PED Flat, good foveal reflex, scattered Drusen, RPE mottling and clumping, focal PEDs, No heme or edema   Vessels attenuated, Tortuous attenuated, Tortuous   Periphery Attached, mild reticular degeneration, no heme Attached, no heme           Refraction     Wearing Rx       Sphere Cylinder Axis Add   Right +2.00 +0.50 008 +2.50   Left +2.50 +1.00 154 +2.50    Type: PAL           IMAGING AND PROCEDURES  Imaging and Procedures for @TODAY @  OCT, Retina - OU - Both Eyes       Right Eye Quality was good. Central Foveal Thickness: 211. Progression has been stable. Findings include normal foveal contour, no IRF, no SRF, retinal drusen , pigment epithelial detachment, outer retinal atrophy (Stable improvement in central SRF overlying low, stable PEDs, central ORA).   Left Eye Quality was good. Central Foveal Thickness: 270. Progression has been stable. Findings include normal foveal contour, no IRF, no SRF, retinal drusen .   Notes *Images captured and  stored on drive  Diagnosis / Impression:  OD: stable improvement in central SRF overlying low, stable PEDs, central ORA OS: non-exu ARMD -- stable  Clinical management:  See below  Abbreviations: NFP - Normal foveal profile. CME - cystoid macular edema. PED - pigment epithelial detachment. IRF - intraretinal fluid. SRF - subretinal fluid. EZ - ellipsoid zone. ERM - epiretinal membrane. ORA - outer retinal atrophy. ORT - outer retinal tubulation. SRHM - subretinal hyper-reflective material     Intravitreal Injection, Pharmacologic Agent - OD - Right Eye       Time Out 11/12/2023. 3:03 PM. Confirmed correct patient, procedure, site, and patient consented.   Anesthesia Topical anesthesia was used. Anesthetic medications included Lidocaine  2%, Proparacaine 0.5%.   Procedure Preparation included 5% betadine  to ocular surface, eyelid speculum. A (32g) needle was used.   Injection: 6 mg faricimab -svoa 6 MG/0.05ML   Route: Intravitreal, Site: Right Eye   NDC: 49757-903-93, Lot: [id number], Expiration date: 03/03/2025, Waste: 0 mL   Post-op Post injection exam found visual acuity of at least counting fingers. The patient tolerated the procedure well. There were no complications. The patient received written and verbal post procedure care education. Post injection medications were not given.            ASSESSMENT/PLAN:    ICD-10-CM   1. Exudative age-related macular degeneration of right eye with active choroidal neovascularization (HCC)  H35.3211 OCT, Retina - OU - Both Eyes    Intravitreal Injection, Pharmacologic Agent - OD - Right Eye    faricimab -svoa (VABYSMO ) 6mg /0.1mL intravitreal injection    2. Intermediate stage nonexudative age-related macular degeneration of left eye  H35.3122  3. Essential hypertension  I10     4. Hypertensive retinopathy of both eyes  H35.033     5. Combined forms of age-related cataract of left eye  H25.812     6. Pseudophakia  Z96.1     7.  Glaucoma suspect of both eyes  H40.003     8. Dry eyes  H04.123      1. Exudative age related macular degeneration, right eye  - FA (03.10.20) shows +CNV w/ staining/leakage  - repeat FA 09.13.21 shows interval increase in perifoveal leakage OD - s/p IVA OD #1 (03.10.20), #2 (04.13.20), #3 (05.11.20), #4 (06.23.20), #5 (8.18.20), #6 (10.27.20), #7 (01.26.21), #8 (04.26.21), #9 (08.02.21), #10 (09.13.21), #11 (10.11.21), #12 (11.9.21), #13 (12.14.21), #14 (1.24.22), #15 (3.15.22), #16 (05.24.22), #17 (8.9.22), #18 (10.18.22), #19 (01.03.23), #20 (03.29.23) -- IVA resistance ========================================================= - s/p IVE OD #1 (06.07.23), #2 (07.19.23), #3 (09.01.23), #4 (10.13.23), #5 (11.10.23), #6 (12.22.23) -- IVE resistance =========================================================  - s/p IVV OD #1 (02.02.24), #2 (03.15.24), #3 (05.01.24), #4 (06.05.24), #5 (07.17.24), #6 (09.11.24), #7 (10.06.24) - pt was doing very well with q3 mo maintenance injections, but then developed interval increases in central SRF over several visits -- IVA and IVE resistance  - BCVA OD 20/30 -- stable - OCT today stable improvement in central SRF overlying low, stable PEDs at 9 weeks   - recommend IVV OD #8 today, 01.08.25 w/ f/u ext to 10 wks  - pt wishes to be treated with IVV OD  - RBA of procedure discussed, questions answered  - IVV informed consent obtained and signed, 02.02.24 (OD)  - see procedure note  - f/u in 10 wks -- DFE/OCT/possible injection, tx and ext as able  2. Age related macular degeneration, non-exudative, left eye - The incidence, anatomy, and pathology of dry AMD, risk of progression, and the AREDS and AREDS 2 study including smoking risks discussed with patient.  - intermediate stage  - recommend amsler grid monitoring  3,4. Hypertensive retinopathy OU  - discussed importance of tight BP control  - monitor  5. Mixed form age related cataracts OS - The  symptoms of cataract, surgical options, and treatments and risks were discussed with patient  - under the expert management of Dr. Fleeta  6. Pseudophakia OD  - s/p CE/IOL (Dr. Fleeta, 11.15.23)  - IOL in good position, doing well  - monitor  7. Glaucoma Suspect OU  - IOP 16,17  - under the expert management of Dr. Fleeta  8. Dry eyes OU  - decreased vision OD today mostly due to increased PEE / dry eyes  - pt reports stopping ATs after hearing about product recalls on ATs - recommend artificial tears and lubricating ointment as needed   Return in about 10 weeks (around 01/21/2024) for f/u exu ARMD OD, DFE, OCT, Possible Injxn. '  Ophthalmic Meds Ordered this visit:  Meds ordered this encounter  Medications   faricimab -svoa (VABYSMO ) 6mg /0.71mL intravitreal injection      This document serves as a record of services personally performed by Redell JUDITHANN Hans, MD, PhD. It was created on their behalf by Almetta Pesa, an ophthalmic technician. The creation of this record is the provider's dictation and/or activities during the visit.    Electronically signed by: Almetta Pesa, OA, 11/12/23  4:37 PM  This document serves as a record of services personally performed by Redell JUDITHANN Hans, MD, PhD. It was created on their behalf by Alan PARAS. Delores, OA an ophthalmic technician. The creation of  this record is the provider's dictation and/or activities during the visit.    Electronically signed by: Alan PARAS. Delores, OA 11/12/23 4:37 PM  Redell JUDITHANN Hans, M.D., Ph.D. Diseases & Surgery of the Retina and Vitreous [facility]  I have reviewed the above documentation for accuracy and completeness, and I agree with the above. Redell JUDITHANN Hans, M.D., Ph.D. 11/12/23 4:38 PM   Abbreviations: M myopia (nearsighted); A astigmatism; H hyperopia (farsighted); P presbyopia; Mrx spectacle prescription;  CTL contact lenses; OD right eye; OS left eye; OU both eyes  XT exotropia; ET  esotropia; PEK punctate epithelial keratitis; PEE punctate epithelial erosions; DES dry eye syndrome; MGD meibomian gland dysfunction; ATs artificial tears; PFAT's preservative free artificial tears; NSC nuclear sclerotic cataract; PSC posterior subcapsular cataract; ERM epi-retinal membrane; PVD posterior vitreous detachment; RD retinal detachment; DM diabetes mellitus; DR diabetic retinopathy; NPDR non-proliferative diabetic retinopathy; PDR proliferative diabetic retinopathy; CSME clinically significant macular edema; DME diabetic macular edema; dbh dot blot hemorrhages; CWS cotton wool spot; POAG primary open angle glaucoma; C/D cup-to-disc ratio; HVF humphrey visual field; GVF goldmann visual field; OCT optical coherence tomography; IOP intraocular pressure; BRVO Branch retinal vein occlusion; CRVO central retinal vein occlusion; CRAO central retinal artery occlusion; BRAO branch retinal artery occlusion; RT retinal tear; SB scleral buckle; PPV pars plana vitrectomy; VH Vitreous hemorrhage; PRP panretinal laser photocoagulation; IVK intravitreal kenalog ; VMT vitreomacular traction; MH Macular hole;  NVD neovascularization of the disc; NVE neovascularization elsewhere; AREDS age related eye disease study; ARMD age related macular degeneration; POAG primary open angle glaucoma; EBMD epithelial/anterior basement membrane dystrophy; ACIOL anterior chamber intraocular lens; IOL intraocular lens; PCIOL posterior chamber intraocular lens; Phaco/IOL phacoemulsification with intraocular lens placement; PRK photorefractive keratectomy; LASIK laser assisted in situ keratomileusis; HTN hypertension; DM diabetes mellitus; COPD chronic obstructive pulmonary disease

## 2023-11-12 ENCOUNTER — Encounter (INDEPENDENT_AMBULATORY_CARE_PROVIDER_SITE_OTHER): Payer: Self-pay | Admitting: Ophthalmology

## 2023-11-12 ENCOUNTER — Ambulatory Visit (INDEPENDENT_AMBULATORY_CARE_PROVIDER_SITE_OTHER): Payer: Medicare Other | Admitting: Ophthalmology

## 2023-11-12 DIAGNOSIS — H35033 Hypertensive retinopathy, bilateral: Secondary | ICD-10-CM

## 2023-11-12 DIAGNOSIS — Z961 Presence of intraocular lens: Secondary | ICD-10-CM

## 2023-11-12 DIAGNOSIS — H353211 Exudative age-related macular degeneration, right eye, with active choroidal neovascularization: Secondary | ICD-10-CM | POA: Diagnosis not present

## 2023-11-12 DIAGNOSIS — H353122 Nonexudative age-related macular degeneration, left eye, intermediate dry stage: Secondary | ICD-10-CM

## 2023-11-12 DIAGNOSIS — I10 Essential (primary) hypertension: Secondary | ICD-10-CM | POA: Diagnosis not present

## 2023-11-12 DIAGNOSIS — H04123 Dry eye syndrome of bilateral lacrimal glands: Secondary | ICD-10-CM

## 2023-11-12 DIAGNOSIS — H25812 Combined forms of age-related cataract, left eye: Secondary | ICD-10-CM | POA: Diagnosis not present

## 2023-11-12 DIAGNOSIS — H40003 Preglaucoma, unspecified, bilateral: Secondary | ICD-10-CM

## 2023-11-12 MED ORDER — FARICIMAB-SVOA 6 MG/0.05ML IZ SOSY
6.0000 mg | PREFILLED_SYRINGE | INTRAVITREAL | Status: AC | PRN
  Administered 2023-11-12: 6 mg via INTRAVITREAL

## 2023-11-24 ENCOUNTER — Telehealth: Payer: Self-pay

## 2023-11-24 ENCOUNTER — Encounter: Payer: Self-pay | Admitting: Family Medicine

## 2023-11-24 ENCOUNTER — Ambulatory Visit (INDEPENDENT_AMBULATORY_CARE_PROVIDER_SITE_OTHER): Payer: Medicare Other | Admitting: Family Medicine

## 2023-11-24 VITALS — BP 132/76 | HR 89 | Ht 59.0 in | Wt 227.0 lb

## 2023-11-24 DIAGNOSIS — I10 Essential (primary) hypertension: Secondary | ICD-10-CM | POA: Diagnosis not present

## 2023-11-24 DIAGNOSIS — M25561 Pain in right knee: Secondary | ICD-10-CM

## 2023-11-24 MED ORDER — TRIAMCINOLONE ACETONIDE 40 MG/ML IJ SUSP
80.0000 mg | Freq: Once | INTRAMUSCULAR | Status: AC
  Administered 2023-11-24: 80 mg via INTRA_ARTICULAR

## 2023-11-24 MED ORDER — ZOLPIDEM TARTRATE 10 MG PO TABS: ORAL_TABLET | ORAL | 5 refills | Status: DC

## 2023-11-24 MED ORDER — OXYCODONE-ACETAMINOPHEN 5-325 MG PO TABS: 1.0000 | ORAL_TABLET | ORAL | 0 refills | Status: DC | PRN

## 2023-11-24 NOTE — Addendum Note (Signed)
Addended by: Venia Carbon K on: 11/24/2023 12:48 PM   Modules accepted: Orders

## 2023-11-24 NOTE — Progress Notes (Signed)
Subjective:    Patient ID: Faith Reeves, female    DOB: 09-02-1951, 73 y.o.   MRN: [id number]   Patient's blood pressure is excellent today at 132/76.  However since I last saw the patient she stopped Ozempic.  She only took it for 1 month.  It was too expensive.  Therefore she elected to discontinue the medication.  Today she complains of right knee pain.  The pain is located over the medial and lateral joint line but is particularly worse over the medial joint line.  It aches and throbs.  She denies any injury or falls.  She is only taking Celebrex twice a week due to her chronic kidney disease.  She is already on oxycodone for chronic pain.  Past Medical History:  Diagnosis Date   Allergy    Anemia    Anxiety    Arthritis    Blood transfusion without reported diagnosis    Cataract    GERD (gastroesophageal reflux disease)    Hypertension    Hypertensive retinopathy    OU   Macular degeneration    Past Surgical History:  Procedure Laterality Date   ABDOMINAL HYSTERECTOMY     ACHILLES TENDON REPAIR Bilateral    APPENDECTOMY     BACK SURGERY     BICEPT TENODESIS Right 04/15/2023   Procedure: RIGHT BICEPS TENODESIS;  Surgeon: Cammy Copa, MD;  Location: [facility] OR;  Service: Orthopedics;  Laterality: Right;   BILATERAL CARPAL TUNNEL RELEASE     CHOLECYSTECTOMY     COLONOSCOPY     EYE SURGERY Right 09/18/2022   cataract extraction   HERNIA REPAIR     JOINT REPLACEMENT     MELANOMA EXCISION Right 09/15/2017   Procedure: EXCISION LIPOSARCOMA RIGHT ARM;  Surgeon: Violeta Gelinas, MD;  Location: [facility] OR;  Service: General;  Laterality: Right;   REVERSE SHOULDER ARTHROPLASTY Right 04/15/2023   Procedure: RIGHT REVERSE SHOULDER ARTHROPLASTY;  Surgeon: Cammy Copa, MD;  Location: [facility] OR;  Service: Orthopedics;  Laterality: Right;   SPINE SURGERY     Current Outpatient Medications on File Prior to Visit  Medication Sig Dispense Refill   albuterol (PROVENTIL HFA;VENTOLIN HFA)  108 (90 Base) MCG/ACT inhaler Inhale 2 puffs into the lungs every 4 (four) hours as needed for wheezing or shortness of breath. 1 Inhaler 0   ALPRAZolam (XANAX) 0.5 MG tablet Take 1 tablet (0.5 mg total) by mouth 3 (three) times daily as needed for anxiety. 30 tablet 0   azithromycin (ZITHROMAX) 250 MG tablet 2 tabs poqday1, 1 tab poqday 2-5 6 tablet 0   celecoxib (CELEBREX) 200 MG capsule TAKE 1 CAPSULE BY MOUTH EVERY DAY (Patient taking differently: Take 200 mg by mouth every other day.) 90 capsule 3   docusate sodium (COLACE) 100 MG capsule Take 1 capsule (100 mg total) by mouth 2 (two) times daily. 10 capsule 0   gabapentin (NEURONTIN) 300 MG capsule Take 1 capsule by mouth three times a day. 90 capsule 5   ipratropium (ATROVENT) 0.06 % nasal spray SPRAY 2 SPRAYS INTO EACH NOSTRIL 4 TIMES A DAY. 15 mL 1   levocetirizine (XYZAL) 5 MG tablet TAKE 1 TABLET BY MOUTH EVERY DAY IN THE EVENING 90 tablet 3   mometasone (ELOCON) 0.1 % cream Apply topically daily. 45 g 2   Multiple Vitamins-Minerals (EQ VISION FORMULA 50+ PO) Take 1 capsule by mouth daily.     pantoprazole (PROTONIX) 40 MG tablet Take 1 tablet (40 mg total) by mouth daily.  Courtesy refill. Annual needed for future refills. 30 tablet 0   Semaglutide-Weight Management (WEGOVY) 0.5 MG/0.5ML SOAJ Inject 0.5 mg into the skin once a week. 2 mL 3   sertraline (ZOLOFT) 50 MG tablet TAKE 1 AND 1/2 TABLETS BY MOUTH DAILY 135 tablet 1   telmisartan-hydrochlorothiazide (MICARDIS HCT) 80-12.5 MG tablet Take 1 tablet by mouth daily. 90 tablet 1   traZODone (DESYREL) 50 MG tablet TAKE 1 TABLET BY MOUTH EVERYDAY AT BEDTIME 90 tablet 1   valACYclovir (VALTREX) 1000 MG tablet Take 2 tablets (2,000 mg total) by mouth 2 (two) times daily. 4 tablet 2   zolpidem (AMBIEN) 10 MG tablet TAKE 1 TABLET BY MOUTH EVERY DAY AT BEDTIME AS NEEDED FOR SLEEP 30 tablet 5   No current facility-administered medications on file prior to visit.   Allergies  Allergen  Reactions   Cymbalta [Duloxetine Hcl] Swelling   Social History   Socioeconomic History   Marital status: Married    Spouse name: Not on file   Number of children: Not on file   Years of education: Not on file   Highest education level: Not on file  Occupational History   Not on file  Tobacco Use   Smoking status: Never   Smokeless tobacco: Never  Vaping Use   Vaping status: Never Used  Substance and Sexual Activity   Alcohol use: Yes    Alcohol/week: 1.0 standard drink of alcohol    Types: 1 Glasses of wine per week   Drug use: No   Sexual activity: Never  Other Topics Concern   Not on file  Social History Narrative   Recent death of son; lives with husband; originally from [place]; most of family still lives in the [place]    Social Drivers of Health   Financial Resource Strain: Low Risk  (10/16/2023)   Overall Financial Resource Strain (CARDIA)    Difficulty of Paying Living Expenses: Not hard at all  Food Insecurity: No Food Insecurity (10/16/2023)   Hunger Vital Sign    Worried About Running Out of Food in the Last Year: Never true    Ran Out of Food in the Last Year: Never true  Transportation Needs: No Transportation Needs (10/16/2023)   PRAPARE - Administrator, Civil Service (Medical): No    Lack of Transportation (Non-Medical): No  Physical Activity: Insufficiently Active (10/16/2023)   Exercise Vital Sign    Days of Exercise per Week: 3 days    Minutes of Exercise per Session: 30 min  Stress: No Stress Concern Present (10/16/2023)   [employer] of Occupational Health - Occupational Stress Questionnaire    Feeling of Stress : Not at all  Social Connections: Moderately Isolated (10/16/2023)   Social Connection and Isolation Panel [NHANES]    Frequency of Communication with Friends and Family: More than three times a week    Frequency of Social Gatherings with Friends and Family: More than three times a week    Attends Religious Services:  Never    Database administrator or Organizations: No    Attends Banker Meetings: Never    Marital Status: Married  Catering manager Violence: Not At Risk (10/16/2023)   Humiliation, Afraid, Rape, and Kick questionnaire    Fear of Current or Ex-Partner: No    Emotionally Abused: No    Physically Abused: No    Sexually Abused: No     Review of Systems  All other systems reviewed and are negative.  Objective:   Physical Exam Constitutional:      Appearance: She is obese.  Cardiovascular:     Rate and Rhythm: Normal rate and regular rhythm.     Heart sounds: Normal heart sounds.  Pulmonary:     Effort: Pulmonary effort is normal.     Breath sounds: Normal breath sounds.  Musculoskeletal:     Thoracic back: Scoliosis present.     Lumbar back: Scoliosis present.     Right knee: Decreased range of motion. Tenderness present over the medial joint line and lateral joint line.  Neurological:     Mental Status: She is alert.           Assessment & Plan:  Primary hypertension - Plan: CBC with Differential/Platelet, COMPLETE METABOLIC PANEL WITH GFR  Acute pain of right knee Based on her description of the pain, I believe the pain is likely due to osteoarthritis.  Less likely would be a meniscal tear.  She is unable to take NSAIDs more frequently due to chronic kidney disease.  Therefore we have elected to proceed with a cortisone injection in the right knee.  Using sterile technique, I injected the right knee with 2 cc of lidocaine, cc of Marcaine, and 2 cc 40 mg/mL kenalog.  The patient tolerated seizure well without complication.  Her blood pressure today is excellent.  I would like to check a CBC and a CMP to monitor kidney function.  While the patient was here today we did refill her oxycodone/Percocet that she uses sparingly due to her chronic low back pain and scoliosis as well as refilled her Ambien

## 2023-11-24 NOTE — Telephone Encounter (Signed)
Copied from CRM [id number]. Topic: Clinical - Medication Question >> Nov 24, 2023  1:16 PM Alcus Dad H wrote: Reason for CRM: Patient was seen today and Dr. Tanya Nones called in her refill for her oxycodone, patient says 5mg  was called in instead of her usual 10mg . Was this an error or was it supposed to be 5mg ?

## 2023-11-25 ENCOUNTER — Other Ambulatory Visit: Payer: Self-pay | Admitting: Family Medicine

## 2023-11-25 LAB — CBC WITH DIFFERENTIAL/PLATELET
Absolute Lymphocytes: 1482 {cells}/uL (ref 850–3900)
Absolute Monocytes: 330 {cells}/uL (ref 200–950)
Basophils Absolute: 30 {cells}/uL (ref 0–200)
Basophils Relative: 0.5 %
Eosinophils Absolute: 276 {cells}/uL (ref 15–500)
Eosinophils Relative: 4.6 %
HCT: 45.3 % — ABNORMAL HIGH (ref 35.0–45.0)
Hemoglobin: 14.6 g/dL (ref 11.7–15.5)
MCH: 29 pg (ref 27.0–33.0)
MCHC: 32.2 g/dL (ref 32.0–36.0)
MCV: 90.1 fL (ref 80.0–100.0)
MPV: 11.6 fL (ref 7.5–12.5)
Monocytes Relative: 5.5 %
Neutro Abs: 3882 {cells}/uL (ref 1500–7800)
Neutrophils Relative %: 64.7 %
Platelets: 236 10*3/uL (ref 140–400)
RBC: 5.03 10*6/uL (ref 3.80–5.10)
RDW: 13.8 % (ref 11.0–15.0)
Total Lymphocyte: 24.7 %
WBC: 6 10*3/uL (ref 3.8–10.8)

## 2023-11-25 LAB — COMPLETE METABOLIC PANEL WITH GFR
AG Ratio: 1.1 (calc) (ref 1.0–2.5)
ALT: 9 U/L (ref 6–29)
AST: 13 U/L (ref 10–35)
Albumin: 3.7 g/dL (ref 3.6–5.1)
Alkaline phosphatase (APISO): 130 U/L (ref 37–153)
BUN/Creatinine Ratio: 17 (calc) (ref 6–22)
BUN: 20 mg/dL (ref 7–25)
CO2: 29 mmol/L (ref 20–32)
Calcium: 9.6 mg/dL (ref 8.6–10.4)
Chloride: 101 mmol/L (ref 98–110)
Creat: 1.18 mg/dL — ABNORMAL HIGH (ref 0.60–1.00)
Globulin: 3.5 g/dL (ref 1.9–3.7)
Glucose, Bld: 103 mg/dL — ABNORMAL HIGH (ref 65–99)
Potassium: 4 mmol/L (ref 3.5–5.3)
Sodium: 141 mmol/L (ref 135–146)
Total Bilirubin: 0.4 mg/dL (ref 0.2–1.2)
Total Protein: 7.2 g/dL (ref 6.1–8.1)
eGFR: 48 mL/min/{1.73_m2} — ABNORMAL LOW (ref >=60)

## 2023-11-25 MED ORDER — OXYCODONE-ACETAMINOPHEN 10-325 MG PO TABS: 1.0000 | ORAL_TABLET | Freq: Three times a day (TID) | ORAL | 0 refills | Status: AC | PRN

## 2023-12-17 ENCOUNTER — Other Ambulatory Visit (INDEPENDENT_AMBULATORY_CARE_PROVIDER_SITE_OTHER): Payer: Self-pay

## 2023-12-17 ENCOUNTER — Ambulatory Visit (INDEPENDENT_AMBULATORY_CARE_PROVIDER_SITE_OTHER): Payer: Medicare Other | Admitting: Orthopedic Surgery

## 2023-12-17 DIAGNOSIS — M65961 Unspecified synovitis and tenosynovitis, right lower leg: Secondary | ICD-10-CM | POA: Diagnosis not present

## 2023-12-17 DIAGNOSIS — M25561 Pain in right knee: Secondary | ICD-10-CM

## 2023-12-18 ENCOUNTER — Other Ambulatory Visit: Payer: Self-pay | Admitting: Orthopedic Surgery

## 2023-12-19 ENCOUNTER — Encounter: Payer: Self-pay | Admitting: Orthopedic Surgery

## 2023-12-19 NOTE — Progress Notes (Signed)
Office Visit Note   Patient: Faith Reeves           Date of Birth: 1951-05-20           MRN: [id number] Visit Date: 12/17/2023 Requested by: Donita Brooks, MD [address] [place],  [place] [address] PCP: Donita Brooks, MD  Subjective: Chief Complaint  Patient presents with   Right Knee - Pain    HPI: Faith Reeves is a 73 y.o. female who presents to the office reporting right knee pain 1 month duration.  She does describe having a fall 4 years ago.  Ambulating with a walker.  Describes some weakness and giving way in that right knee.  Describes locking and catching and pretty severe pain with almost any activity.  This is a change for her compared to 8 weeks ago.  Most steps are painful but not every step.  Patient states that her back does always hurt.  Pain radiates from the mid to lateral knee region down the calf.  Did have a cortisone shot in the anterior medial inferior aspect of her knee.  Takes oxycodone at night..                ROS: All systems reviewed are negative as they relate to the chief complaint within the history of present illness.  Patient denies fevers or chills.  Assessment & Plan: Visit Diagnoses:  1. Right knee pain, unspecified chronicity     Plan: Impression is right knee pain unclear etiology.  Radiographs show no fracture.  Degenerative changes are present.  I think she may have a lateral meniscal tear versus stress fracture in that lateral compartment of the knee.  Toradol injected today into the right knee through superior lateral approach for some pain relief.  Will see what the scan shows on the knee and treat accordingly.  Follow-Up Instructions: No follow-ups on file.   Orders:  Orders Placed This Encounter  Procedures   XR KNEE 3 VIEW RIGHT   MR Knee Right w/o contrast   No orders of the defined types were placed in this encounter.     Procedures: Large Joint Inj: R knee on 12/17/2023 8:18 AM Indications: diagnostic evaluation,  joint swelling and pain Details: 18 G 1.5 in needle, superolateral approach  Arthrogram: No  Medications: 5 mL lidocaine 1 %; 4 mL bupivacaine 0.25 % Outcome: tolerated well, no immediate complications Procedure, treatment alternatives, risks and benefits explained, specific risks discussed. Consent was given by the patient. Immediately prior to procedure a time out was called to verify the correct patient, procedure, equipment, support staff and site/side marked as required. Patient was prepped and draped in the usual sterile fashion.    Toradol injected   Clinical Data: No additional findings.  Objective: Vital Signs: There were no vitals taken for this visit.  Physical Exam:  Constitutional: Patient appears well-developed HEENT:  Head: Normocephalic Eyes:EOM are normal Neck: Normal range of motion Cardiovascular: Normal rate Pulmonary/chest: Effort normal Neurologic: Patient is alert Skin: Skin is warm Psychiatric: Patient has normal mood and affect  Ortho Exam: Ortho exam demonstrates mildly positive nerve root tension signs on the right compared to the left.  No effusion is present in that right knee.  No groin pain with internal/external Tatian of the leg.  Pedal pulses palpable.  Ankle dorsiflexion strength is 5+ out of 5.  No masses lymphadenopathy or skin changes noted in that right knee region.  Range of motion is  full.  Has lateral greater than medial joint line tenderness.  Extensor mechanism intact.  Specialty Comments:  No specialty comments available.  Imaging: No results found.   PMFS History: Patient Active Problem List   Diagnosis Date Noted   Morbid obesity (HCC) 08/04/2023   Arthritis of right shoulder region 05/04/2023   Biceps tendonitis on right 05/04/2023   S/P reverse total shoulder arthroplasty, right 04/15/2023   Macular degeneration of right eye 12/18/2016   Allergy    Anxiety    Blood transfusion without reported diagnosis    Cataract     Hypertension    Past Medical History:  Diagnosis Date   Allergy    Anemia    Anxiety    Arthritis    Blood transfusion without reported diagnosis    Cataract    GERD (gastroesophageal reflux disease)    Hypertension    Hypertensive retinopathy    OU   Macular degeneration     Family History  Problem Relation Age of Onset   Hypertension Mother    Cancer Sister    Stroke Maternal Grandmother     Past Surgical History:  Procedure Laterality Date   ABDOMINAL HYSTERECTOMY     ACHILLES TENDON REPAIR Bilateral    APPENDECTOMY     BACK SURGERY     BICEPT TENODESIS Right 04/15/2023   Procedure: RIGHT BICEPS TENODESIS;  Surgeon: Cammy Copa, MD;  Location: [facility] OR;  Service: Orthopedics;  Laterality: Right;   BILATERAL CARPAL TUNNEL RELEASE     CHOLECYSTECTOMY     COLONOSCOPY     EYE SURGERY Right 09/18/2022   cataract extraction   HERNIA REPAIR     JOINT REPLACEMENT     MELANOMA EXCISION Right 09/15/2017   Procedure: EXCISION LIPOSARCOMA RIGHT ARM;  Surgeon: Violeta Gelinas, MD;  Location: [facility] OR;  Service: General;  Laterality: Right;   REVERSE SHOULDER ARTHROPLASTY Right 04/15/2023   Procedure: RIGHT REVERSE SHOULDER ARTHROPLASTY;  Surgeon: Cammy Copa, MD;  Location[facility] OR;  Service: Orthopedics;  Laterality: Right;   SPINE SURGERY     Social History   Occupational History   Not on file  Tobacco Use   Smoking status: Never   Smokeless tobacco: Never  Vaping Use   Vaping status: Never Used  Substance and Sexual Activity   Alcohol use: Yes    Alcohol/week: 1.0 standard drink of alcohol    Types: 1 Glasses of wine per week   Drug use: No   Sexual activity: Never

## 2023-12-21 MED ORDER — LIDOCAINE HCL 1 % IJ SOLN
5.0000 mL | INTRAMUSCULAR | Status: AC | PRN
  Administered 2023-12-17: 5 mL

## 2023-12-21 MED ORDER — BUPIVACAINE HCL 0.25 % IJ SOLN
4.0000 mL | INTRAMUSCULAR | Status: AC | PRN
  Administered 2023-12-17: 4 mL via INTRA_ARTICULAR

## 2023-12-22 ENCOUNTER — Ambulatory Visit
Admission: RE | Admit: 2023-12-22 | Discharge: 2023-12-22 | Disposition: A | Discharge status: Home / Self Care | Payer: Medicare Other | Source: Ambulatory Visit | Attending: Orthopedic Surgery | Admitting: Orthopedic Surgery

## 2023-12-22 DIAGNOSIS — M23331 Other meniscus derangements, other medial meniscus, right knee: Secondary | ICD-10-CM | POA: Diagnosis not present

## 2023-12-22 DIAGNOSIS — M25561 Pain in right knee: Secondary | ICD-10-CM

## 2023-12-29 ENCOUNTER — Other Ambulatory Visit: Payer: Self-pay | Admitting: Family Medicine

## 2023-12-29 NOTE — Telephone Encounter (Signed)
 Copied from CRM [id number]. Topic: Clinical - Medication Refill >> Dec 29, 2023 12:31 PM Geroge Baseman wrote: Most Recent Primary Care Visit:  Provider: Lynnea Ferrier T  Department: [facility]  Visit Type: OFFICE VISIT  Date: 11/24/2023  Medication: oxyCODONE-acetaminophen (PERCOCET) 10-325 MG tablet  Has the patient contacted their pharmacy? Yes   Is this the correct pharmacy for this prescription? Yes If no, delete pharmacy and type the correct one.  This is the patient's preferred pharmacy:  [id number] [place], [place] - [address] [place] [address] Phone: [phone] Fax: [phone]   Has the prescription been filled recently? Yes  Is the patient out of the medication? Yes  Has the patient been seen for an appointment in the last year OR does the patient have an upcoming appointment? Yes  Can we respond through MyChart? No  Agent: Please be advised that Rx refills may take up to 3 business days. We ask that you follow-up with your pharmacy.

## 2023-12-30 NOTE — Progress Notes (Signed)
 I called.  No intervention required.  She is going to get injections episodically.  Discussed MRI results with her.  No follow-up required either.  Thanks

## 2023-12-30 NOTE — Telephone Encounter (Signed)
 Requested medications are due for refill today.  unsure  Requested medications are on the active medications list.  yes  Last refill. 12/29/2023 by historical provider  Future visit scheduled.   no  Notes to clinic.  Refill refusal not delegated. Medication is historical.    Requested Prescriptions  Pending Prescriptions Disp Refills   oxyCODONE-acetaminophen (PERCOCET) 10-325 MG tablet 30 tablet 0    Sig: Take 1 tablet by mouth every 4 (four) hours as needed for pain.     Not Delegated - Analgesics:  Opioid Agonist Combinations Failed - 12/30/2023  2:32 PM      Failed - This refill cannot be delegated      Failed - Urine Drug Screen completed in last 360 days      Failed - Valid encounter within last 3 months    Recent Outpatient Visits           1 year ago Benign essential HTN   [facility] Donita Brooks, MD   2 years ago Benign essential HTN   [facility] Donita Brooks, MD   3 years ago Benign essential HTN   [facility] Donita Brooks, MD   4 years ago Acute idiopathic gout of right wrist   [facility] Pickard, Priscille Heidelberg, MD   4 years ago General medical exam   [facility] Donita Brooks, MD              Signed Prescriptions Disp Refills   oxyCODONE-acetaminophen (PERCOCET) 10-325 MG tablet      Sig: Take 1 tablet by mouth every 4 (four) hours as needed for pain.     There is no refill protocol information for this order

## 2024-01-01 MED ORDER — OXYCODONE-ACETAMINOPHEN 10-325 MG PO TABS: 1.0000 | ORAL_TABLET | ORAL | 0 refills | Status: DC | PRN

## 2024-01-04 ENCOUNTER — Other Ambulatory Visit: Payer: Self-pay | Admitting: Family Medicine

## 2024-01-08 NOTE — Progress Notes (Addendum)
 [facility] - Clinic Note  01/21/2024   CHIEF COMPLAINT Patient presents for Retina Follow Up  HISTORY OF PRESENT ILLNESS: Faith Reeves is a 73 y.o. female who presents to the clinic today for:   HPI     Retina Follow Up   Patient presents with  Wet AMD.  In right eye.  This started 10 weeks ago.  I, the attending physician,  performed the HPI with the patient and updated documentation appropriately.        Comments   Patient here for 10 weeks retina follow up for exu ARMD OD. Patient states vision doing ok. No eye pain. OD is a little cloudy top part of OD. Not all the time. No eye pain.       Last edited by Rennis Chris, MD on 01/21/2024  2:27 PM.    Pt feels like her vision is cloudy at the top, it feels like there is a shade in her vision  Referring physician: Donita Brooks, MD [address] [place],  [place] [address]  HISTORICAL INFORMATION:   Selected notes from the MEDICAL RECORD NUMBER Referred by Dr. Baker Pierini for concern of exu ARMD OU   CURRENT MEDICATIONS: No current outpatient medications on file. (Ophthalmic Drugs)   No current facility-administered medications for this visit. (Ophthalmic Drugs)   Current Outpatient Medications (Other)  Medication Sig   albuterol (PROVENTIL HFA;VENTOLIN HFA) 108 (90 Base) MCG/ACT inhaler Inhale 2 puffs into the lungs every 4 (four) hours as needed for wheezing or shortness of breath.   ALPRAZolam (XANAX) 0.5 MG tablet Take 1 tablet (0.5 mg total) by mouth 3 (three) times daily as needed for anxiety.   celecoxib (CELEBREX) 200 MG capsule TAKE 1 CAPSULE BY MOUTH EVERY DAY (Patient taking differently: Take 200 mg by mouth every other day.)   docusate sodium (COLACE) 100 MG capsule Take 1 capsule (100 mg total) by mouth 2 (two) times daily.   gabapentin (NEURONTIN) 300 MG capsule Take 1 capsule by mouth three times a day.   ipratropium (ATROVENT) 0.06 % nasal spray SPRAY 2 SPRAYS INTO EACH NOSTRIL  4 TIMES A DAY.   levocetirizine (XYZAL) 5 MG tablet TAKE 1 TABLET BY MOUTH EVERY DAY IN THE EVENING   mometasone (ELOCON) 0.1 % cream Apply topically daily.   Multiple Vitamins-Minerals (EQ VISION FORMULA 50+ PO) Take 1 capsule by mouth daily.   oxyCODONE-acetaminophen (PERCOCET) 10-325 MG tablet Take 1 tablet by mouth every 4 (four) hours as needed for pain.   pantoprazole (PROTONIX) 40 MG tablet Take 1 tablet (40 mg total) by mouth daily. Courtesy refill. Annual needed for future refills.   Semaglutide-Weight Management (WEGOVY) 0.5 MG/0.5ML SOAJ Inject 0.5 mg into the skin once a week.   sertraline (ZOLOFT) 50 MG tablet TAKE 1 AND 1/2 TABLETS BY MOUTH DAILY   telmisartan-hydrochlorothiazide (MICARDIS HCT) 80-12.5 MG tablet Take 1 tablet by mouth daily.   traZODone (DESYREL) 50 MG tablet TAKE 1 TABLET BY MOUTH EVERYDAY AT BEDTIME   valACYclovir (VALTREX) 1000 MG tablet Take 2 tablets (2,000 mg total) by mouth 2 (two) times daily.   zolpidem (AMBIEN) 10 MG tablet TAKE 1 TABLET BY MOUTH EVERY DAY AT BEDTIME AS NEEDED FOR SLEEP   azithromycin (ZITHROMAX) 250 MG tablet 2 tabs poqday1, 1 tab poqday 2-5 (Patient not taking: Reported on 01/21/2024)   No current facility-administered medications for this visit. (Other)   REVIEW OF SYSTEMS: ROS   Positive for: Gastrointestinal,  Neurological, Eyes Negative for: Constitutional, Skin, Genitourinary, Musculoskeletal, HENT, Endocrine, Cardiovascular, Respiratory, Psychiatric, Allergic/Imm, Heme/Lymph Last edited by Laddie Aquas, COA on 01/21/2024  1:49 PM.     ALLERGIES Allergies  Allergen Reactions   Cymbalta [Duloxetine Hcl] Swelling   PAST MEDICAL HISTORY Past Medical History:  Diagnosis Date   Allergy    Anemia    Anxiety    Arthritis    Blood transfusion without reported diagnosis    Cataract    GERD (gastroesophageal reflux disease)    Hypertension    Hypertensive retinopathy    OU   Macular degeneration    Past Surgical  History:  Procedure Laterality Date   ABDOMINAL HYSTERECTOMY     ACHILLES TENDON REPAIR Bilateral    APPENDECTOMY     BACK SURGERY     BICEPT TENODESIS Right 04/15/2023   Procedure: RIGHT BICEPS TENODESIS;  Surgeon: Cammy Copa, MD;  Location: [facility] OR;  Service: Orthopedics;  Laterality: Right;   BILATERAL CARPAL TUNNEL RELEASE     CHOLECYSTECTOMY     COLONOSCOPY     EYE SURGERY Right 09/18/2022   cataract extraction   HERNIA REPAIR     JOINT REPLACEMENT     MELANOMA EXCISION Right 09/15/2017   Procedure: EXCISION LIPOSARCOMA RIGHT ARM;  Surgeon: Violeta Gelinas, MD;  Location: [facility] OR;  Service: General;  Laterality: Right;   REVERSE SHOULDER ARTHROPLASTY Right 04/15/2023   Procedure: RIGHT REVERSE SHOULDER ARTHROPLASTY;  Surgeon: Cammy Copa, MD;  Location: [facility] OR;  Service: Orthopedics;  Laterality: Right;   SPINE SURGERY     FAMILY HISTORY Family History  Problem Relation Age of Onset   Hypertension Mother    Cancer Sister    Stroke Maternal Grandmother    SOCIAL HISTORY Social History   Tobacco Use   Smoking status: Never   Smokeless tobacco: Never  Vaping Use   Vaping status: Never Used  Substance Use Topics   Alcohol use: Yes    Alcohol/week: 1.0 standard drink of alcohol    Types: 1 Glasses of wine per week   Drug use: No       OPHTHALMIC EXAM: Base Eye Exam     Visual Acuity (Snellen - Linear)       Right Left   Dist cc 20/30 -2 20/20 -2   Dist ph cc 20/30 +2     Correction: Glasses         Tonometry (Tonopen, 1:46 PM)       Right Left   Pressure 14 17         Pupils       Dark Light Shape React APD   Right 3 2 Round Brisk None   Left 3 2 Round Brisk None         Visual Fields (Counting fingers)       Left Right    Full Full         Extraocular Movement       Right Left    Full, Ortho Full, Ortho         Neuro/Psych     Oriented x3: Yes   Mood/Affect: Normal         Dilation     Both eyes: 1.0%  Mydriacyl, 2.5% Phenylephrine @ 1:46 PM           Slit Lamp and Fundus Exam     Slit Lamp Exam       Right Left   Lids/Lashes Dermatochalasis - upper  lid, mild Meibomian gland dysfunction Dermatochalasis - upper lid, mild Meibomian gland dysfunction   Conjunctiva/Sclera White and quiet White and quiet   Cornea 2-3+ fine Punctate epithelial erosions 2+ fine Punctate epithelial erosions inferiorly   Anterior Chamber deep, clear, narrow temporal angle deep, clear, narrow temporal angle   Iris Round and dilated Round and dilated   Lens PCIOL in good position 2-3+ Nuclear sclerosis, 2-3+ Cortical cataract   Anterior Vitreous Vitreous syneresis, Posterior vitreous detachment, vitreous condensations Vitreous syneresis, Posterior vitreous detachment, mild vitreous condensations         Fundus Exam       Right Left   Disc Pink and Sharp, Compact, +PPP Pink and Sharp, temporal PPP, Compact   C/D Ratio 0.2 0.1   Macula Blunted foveal reflex, drusen, RPE mottling, clumping and atrophy, +CNV, Stable resolution of central SRF, No heme, persistent low PED Flat, good foveal reflex, scattered Drusen, RPE mottling and clumping, focal PEDs, No heme or edema   Vessels attenuated, Tortuous attenuated, Tortuous   Periphery Attached, mild reticular degeneration, no heme Attached, no heme           Refraction     Wearing Rx       Sphere Cylinder Axis Add   Right +2.00 +0.50 008 +2.50   Left +2.50 +1.00 154 +2.50    Type: PAL           IMAGING AND PROCEDURES  Imaging and Procedures for @TODAY @  OCT, Retina - OU - Both Eyes       Right Eye Quality was good. Central Foveal Thickness: 214. Progression has been stable. Findings include normal foveal contour, no IRF, no SRF, retinal drusen , pigment epithelial detachment, outer retinal atrophy (Stable improvement in central SRF overlying low, stable PEDs, central ORA).   Left Eye Quality was good. Central Foveal Thickness: 272.  Progression has been stable. Findings include normal foveal contour, no IRF, no SRF, retinal drusen .   Notes *Images captured and stored on drive  Diagnosis / Impression:  OD: stable improvement in central SRF overlying low, stable PEDs, central ORA OS: non-exu ARMD -- stable  Clinical management:  See below  Abbreviations: NFP - Normal foveal profile. CME - cystoid macular edema. PED - pigment epithelial detachment. IRF - intraretinal fluid. SRF - subretinal fluid. EZ - ellipsoid zone. ERM - epiretinal membrane. ORA - outer retinal atrophy. ORT - outer retinal tubulation. SRHM - subretinal hyper-reflective material     Intravitreal Injection, Pharmacologic Agent - OD - Right Eye       Time Out 01/21/2024. 2:04 PM. Confirmed correct patient, procedure, site, and patient consented.   Anesthesia Topical anesthesia was used. Anesthetic medications included Lidocaine 2%, Proparacaine 0.5%.   Procedure Preparation included 5% betadine to ocular surface, eyelid speculum. A (32g) needle was used.   Injection: 6 mg faricimab-svoa 6 MG/0.05ML   Route: Intravitreal, Site: Right Eye   NDC: 69629-528-41, Lot: [id number], Expiration date: 03/03/2025, Waste: 0 mL   Post-op Post injection exam found visual acuity of at least counting fingers. The patient tolerated the procedure well. There were no complications. The patient received written and verbal post procedure care education. Post injection medications were not given.            ASSESSMENT/PLAN:    ICD-10-CM   1. Exudative age-related macular degeneration of right eye with active choroidal neovascularization (HCC)  H35.3211 OCT, Retina - OU - Both Eyes    Intravitreal Injection, Pharmacologic Agent - OD -  Right Eye    faricimab-svoa (VABYSMO) 6mg /0.64mL intravitreal injection    2. Intermediate stage nonexudative age-related macular degeneration of left eye  H35.3122     3. Essential hypertension  I10     4. Hypertensive  retinopathy of both eyes  H35.033     5. Combined forms of age-related cataract of left eye  H25.812     6. Pseudophakia  Z96.1     7. Glaucoma suspect of both eyes  H40.003     8. Dry eyes  H04.123      1. Exudative age related macular degeneration, right eye  - FA (03.10.20) shows +CNV w/ staining/leakage  - repeat FA 09.13.21 shows interval increase in perifoveal leakage OD - s/p IVA OD #1 (03.10.20), #2 (04.13.20), #3 (05.11.20), #4 (06.23.20), #5 (8.18.20), #6 (10.27.20), #7 (01.26.21), #8 (04.26.21), #9 (08.02.21), #10 (09.13.21), #11 (10.11.21), #12 (11.9.21), #13 (12.14.21), #14 (1.24.22), #15 (3.15.22), #16 (05.24.22), #17 (8.9.22), #18 (10.18.22), #19 (01.03.23), #20 (03.29.23) -- IVA resistance ========================================================= - s/p IVE OD #1 (06.07.23), #2 (07.19.23), #3 (09.01.23), #4 (10.13.23), #5 (11.10.23), #6 (12.22.23) -- IVE resistance =========================================================  - s/p IVV OD #1 (02.02.24), #2 (03.15.24), #3 (05.01.24), #4 (06.05.24), #5 (07.17.24), #6 (09.11.24), #7 (10.06.24), #8 (01.08.25) - pt was doing very well with q3 mo maintenance injections, but then developed interval increases in central SRF over several visits -- IVA and IVE resistance  - BCVA OD 20/30 -- stable - OCT today stable improvement in central SRF overlying low, stable PEDs at 10 weeks   - recommend IVV OD #8 today, 03.19.25 w/ f/u ext to 12 wks  - pt wishes to be treated with IVV OD  - RBA of procedure discussed, questions answered  - IVV informed consent obtained and signed, 02.02.24 (OD)  - see procedure note  - f/u in 12 wks -- DFE/OCT/possible injection, tx and ext as able  2. Age related macular degeneration, non-exudative, left eye - The incidence, anatomy, and pathology of dry AMD, risk of progression, and the AREDS and AREDS 2 study including smoking risks discussed with patient.  - intermediate stage  - recommend amsler grid  monitoring  3,4. Hypertensive retinopathy OU  - discussed importance of tight BP control  - monitor  5. Mixed form age related cataracts OS - The symptoms of cataract, surgical options, and treatments and risks were discussed with patient  - under the expert management of Dr. Zenaida Niece  6. Pseudophakia OD  - s/p CE/IOL (Dr. Zenaida Niece, 11.15.23)  - IOL in good position, doing well  - monitor  7. Glaucoma Suspect OU  - IOP 14,17  - under the expert management of Dr. Zenaida Niece  8. Dry eyes OU  - decreased vision OD today mostly due to increased PEE / dry eyes  - pt reports stopping ATs after hearing about product recalls on ATs - recommend artificial tears and lubricating ointment as needed   Return in about 12 weeks (around 04/14/2024) for f/u exu ARMD OD, DFE, OCT, Possible Injxn. '  Ophthalmic Meds Ordered this visit:  Meds ordered this encounter  Medications   faricimab-svoa (VABYSMO) 6mg /0.55mL intravitreal injection      This document serves as a record of services personally performed by Karie Chimera, MD, PhD. It was created on their behalf by Glee Arvin. Manson Passey, OA an ophthalmic technician. The creation of this record is the provider's dictation and/or activities during the visit.    Electronically signed by: Glee Arvin. Manson Passey, OA 01/21/24 6:34 PM  Karie Chimera, M.D., Ph.D. Diseases & Surgery of the Retina and Vitreous [facility]  I have reviewed the above documentation for accuracy and completeness, and I agree with the above. Karie Chimera, M.D., Ph.D. 01/21/24 6:34 PM   Abbreviations: M myopia (nearsighted); A astigmatism; H hyperopia (farsighted); P presbyopia; Mrx spectacle prescription;  CTL contact lenses; OD right eye; OS left eye; OU both eyes  XT exotropia; ET esotropia; PEK punctate epithelial keratitis; PEE punctate epithelial erosions; DES dry eye syndrome; MGD meibomian gland dysfunction; ATs artificial tears; PFAT's preservative free artificial  tears; NSC nuclear sclerotic cataract; PSC posterior subcapsular cataract; ERM epi-retinal membrane; PVD posterior vitreous detachment; RD retinal detachment; DM diabetes mellitus; DR diabetic retinopathy; NPDR non-proliferative diabetic retinopathy; PDR proliferative diabetic retinopathy; CSME clinically significant macular edema; DME diabetic macular edema; dbh dot blot hemorrhages; CWS cotton wool spot; POAG primary open angle glaucoma; C/D cup-to-disc ratio; HVF humphrey visual field; GVF goldmann visual field; OCT optical coherence tomography; IOP intraocular pressure; BRVO Branch retinal vein occlusion; CRVO central retinal vein occlusion; CRAO central retinal artery occlusion; BRAO branch retinal artery occlusion; RT retinal tear; SB scleral buckle; PPV pars plana vitrectomy; VH Vitreous hemorrhage; PRP panretinal laser photocoagulation; IVK intravitreal kenalog; VMT vitreomacular traction; MH Macular hole;  NVD neovascularization of the disc; NVE neovascularization elsewhere; AREDS age related eye disease study; ARMD age related macular degeneration; POAG primary open angle glaucoma; EBMD epithelial/anterior basement membrane dystrophy; ACIOL anterior chamber intraocular lens; IOL intraocular lens; PCIOL posterior chamber intraocular lens; Phaco/IOL phacoemulsification with intraocular lens placement; PRK photorefractive keratectomy; LASIK laser assisted in situ keratomileusis; HTN hypertension; DM diabetes mellitus; COPD chronic obstructive pulmonary disease

## 2024-01-14 ENCOUNTER — Other Ambulatory Visit: Payer: Self-pay | Admitting: Family Medicine

## 2024-01-14 DIAGNOSIS — T7840XD Allergy, unspecified, subsequent encounter: Secondary | ICD-10-CM

## 2024-01-14 NOTE — Telephone Encounter (Signed)
 Requested medication (s) are due for refill today: review  Requested medication (s) are on the active medication list: yes  Last refill:  12/26/23 #38ml/1  Future visit scheduled: no  Notes to clinic:  Unable to refill per protocol, medication not assigned to the refill protocol.     Requested Prescriptions  Pending Prescriptions Disp Refills   ipratropium (ATROVENT) 0.06 % nasal spray [Pharmacy Med Name: IPRATROPIUM 0.06% SPRAY]  1    Sig: SPRAY 2 SPRAYS INTO EACH NOSTRIL 4 TIMES A DAY.     Off-Protocol Failed - 01/14/2024  5:21 PM      Failed - Medication not assigned to a protocol, review manually.      Failed - Valid encounter within last 12 months    Recent Outpatient Visits           1 year ago Benign essential HTN   [facility] Tanya Nones, Priscille Heidelberg, MD   2 years ago Benign essential HTN   [facility] Tanya Nones, Priscille Heidelberg, MD   3 years ago Benign essential HTN   [facility] Tanya Nones Priscille Heidelberg, MD   4 years ago Acute idiopathic gout of right wrist   [facility] Pickard, Priscille Heidelberg, MD   4 years ago General medical exam   [facility] Donita Brooks, MD             Off-Protocol Failed - 01/14/2024  5:21 PM      Failed - Medication not assigned to a protocol, review manually.      Failed - Valid encounter within last 12 months    Recent Outpatient Visits           1 year ago Benign essential HTN   [facility] Tanya Nones Priscille Heidelberg, MD   2 years ago Benign essential HTN   [facility] Tanya Nones, Priscille Heidelberg, MD   3 years ago Benign essential HTN   [facility] Medicine Donita Brooks, MD   4 years ago Acute idiopathic gout of right wrist   [facility] Pickard, Priscille Heidelberg, MD   4 years ago General medical exam   [facility] Pickard, Priscille Heidelberg, MD

## 2024-01-21 ENCOUNTER — Encounter (INDEPENDENT_AMBULATORY_CARE_PROVIDER_SITE_OTHER): Payer: Self-pay | Admitting: Ophthalmology

## 2024-01-21 ENCOUNTER — Ambulatory Visit (INDEPENDENT_AMBULATORY_CARE_PROVIDER_SITE_OTHER): Payer: Medicare Other | Admitting: Ophthalmology

## 2024-01-21 DIAGNOSIS — H04123 Dry eye syndrome of bilateral lacrimal glands: Secondary | ICD-10-CM

## 2024-01-21 DIAGNOSIS — H40003 Preglaucoma, unspecified, bilateral: Secondary | ICD-10-CM

## 2024-01-21 DIAGNOSIS — H353211 Exudative age-related macular degeneration, right eye, with active choroidal neovascularization: Secondary | ICD-10-CM

## 2024-01-21 DIAGNOSIS — H35033 Hypertensive retinopathy, bilateral: Secondary | ICD-10-CM

## 2024-01-21 DIAGNOSIS — Z961 Presence of intraocular lens: Secondary | ICD-10-CM

## 2024-01-21 DIAGNOSIS — I10 Essential (primary) hypertension: Secondary | ICD-10-CM

## 2024-01-21 DIAGNOSIS — H25812 Combined forms of age-related cataract, left eye: Secondary | ICD-10-CM | POA: Diagnosis not present

## 2024-01-21 DIAGNOSIS — H353122 Nonexudative age-related macular degeneration, left eye, intermediate dry stage: Secondary | ICD-10-CM | POA: Diagnosis not present

## 2024-01-21 MED ORDER — FARICIMAB-SVOA 6 MG/0.05ML IZ SOSY
6.0000 mg | PREFILLED_SYRINGE | INTRAVITREAL | Status: AC | PRN
  Administered 2024-01-21: 6 mg via INTRAVITREAL

## 2024-02-02 ENCOUNTER — Other Ambulatory Visit: Payer: Self-pay | Admitting: Family Medicine

## 2024-02-02 NOTE — Telephone Encounter (Signed)
 Copied from CRM [id number]. Topic: Clinical - Medication Refill >> Feb 02, 2024 12:45 PM Emylou G wrote: Most Recent Primary Care Visit:  Provider: Lynnea Ferrier T  [facility]  Visit Type: OFFICE VISIT  Date: 11/24/2023  Medication: oxyCODONE-acetaminophen (PERCOCET) 10-325 MG tablet Amoxicillin 875mg  tab  Has the patient contacted their pharmacy? No (Agent: If no, request that the patient contact the pharmacy for the refill. If patient does not wish to contact the pharmacy document the reason why and proceed with request.) (Agent: If yes, when and what did the pharmacy advise?)  Is this the correct pharmacy for this prescription? Yes If no, delete pharmacy and type the correct one.  This is the patient's preferred pharmacy:  [id number] [place], [place] - [address] [place] [address] Phone: [phone] Fax: [phone]   Has the prescription been filled recently? No  Is the patient out of the medication? Yes  Has the patient been seen for an appointment in the last year OR does the patient have an upcoming appointment? Yes  Can we respond through MyChart? No  Agent: Please be advised that Rx refills may take up to 3 business days. We ask that you follow-up with your pharmacy.

## 2024-02-04 ENCOUNTER — Other Ambulatory Visit: Payer: Self-pay | Admitting: Family Medicine

## 2024-02-04 ENCOUNTER — Telehealth: Payer: Self-pay

## 2024-02-04 MED ORDER — ALPRAZOLAM 1 MG PO TABS: 1.0000 mg | ORAL_TABLET | Freq: Three times a day (TID) | ORAL | 0 refills | Status: DC | PRN

## 2024-02-04 NOTE — Telephone Encounter (Signed)
 Spoke with pt. Apologized for her loss. Pt states she found Mr. Najera deceased this morning, in his sleep. Very upset. Thank you.   Copied from CRM [id number]. Topic: Clinical - Medical Advice >> Feb 04, 2024 10:12 AM Almira Coaster wrote: Reason for CRM: Patient's neighbor Molli Hazard is calling on behalf of the patient to inform that Jarae Nemmers husband Jayra Choyce has passed away this morning, She would like to know if Dr.Pickard is able to send anything to help calm her nerves.

## 2024-02-04 NOTE — Telephone Encounter (Signed)
 Amber, can you send in Xanax 1 mg every 8 hours as needed, disp # 30 for Dr. Tanya Nones for this patient? He does not currently have access to a computer. Thank you.  Spoke with pt. Apologized for her loss. Pt states she found Mr. Swisher deceased this morning, in his sleep. Very upset. Thank you.    Copied from CRM [id number]. Topic: Clinical - Medical Advice >> Feb 04, 2024 10:12 AM Almira Coaster wrote: Reason for CRM: Patient's neighbor Molli Hazard is calling on behalf of the patient to inform that Ryder Chesmore husband Molley Houser has passed away this morning, She would like to know if Dr.Pickard is able to send anything to help calm her nerves.

## 2024-02-04 NOTE — Progress Notes (Signed)
 Provided Xanax 1mg  q8h rx for pt in setting of passing of her husband at request of PCP. PDMP reviewed. No recent fill of prior Xanax rx. Informed of risks of respiratory depression and advised to avoid coadministration with opioids and [place]

## 2024-02-04 NOTE — Telephone Encounter (Signed)
 Requested medication (s) are due for refill today: yes  Requested medication (s) are on the active medication list: yes  Last refill:  01/01/24  Future visit scheduled: no  Notes to clinic:  Unable to refill per protocol, cannot delegate.      Requested Prescriptions  Pending Prescriptions Disp Refills   oxyCODONE-acetaminophen (PERCOCET) 10-325 MG tablet 30 tablet 0    Sig: Take 1 tablet by mouth every 4 (four) hours as needed for pain.     Not Delegated - Analgesics:  Opioid Agonist Combinations Failed - 02/04/2024 12:22 PM      Failed - This refill cannot be delegated      Failed - Urine Drug Screen completed in last 360 days      Failed - Valid encounter within last 3 months    Recent Outpatient Visits           2 months ago Primary hypertension   [facility] Donita Brooks, MD   6 months ago Benign essential HTN   [place] [facility] Tanya Nones, Priscille Heidelberg, MD   8 months ago Primary hypertension   [facility] Tanya Nones, Priscille Heidelberg, MD   1 year ago Benign essential HTN   [facility] Donita Brooks, MD   8 years ago Insomnia   [facility], Tessa Lerner, MD               amoxicillin (AMOXIL) 875 MG tablet      Sig: Take 1 tablet (875 mg total) by mouth 2 (two) times daily.     Off-Protocol Failed - 02/04/2024 12:22 PM      Failed - Medication not assigned to a protocol, review manually.      Passed - Valid encounter within last 12 months    Recent Outpatient Visits           2 months ago Primary hypertension   [place] [facility] Tanya Nones, Priscille Heidelberg, MD   6 months ago Benign essential HTN   [place] Tanya Nones, Priscille Heidelberg, MD   8 months ago Primary hypertension   [facility] Tanya Nones, Priscille Heidelberg, MD   1 year ago Benign essential HTN   [place] [facility]  Medicine Donita Brooks, MD   8 years ago Insomnia   [facility], Tessa Lerner, MD              Signed Prescriptions Disp Refills   amoxicillin (AMOXIL) 875 MG tablet      Sig: Take 875 mg by mouth 2 (two) times daily.     There is no refill protocol information for this order

## 2024-02-05 ENCOUNTER — Other Ambulatory Visit: Payer: Self-pay | Admitting: Family Medicine

## 2024-02-05 MED ORDER — OXYCODONE-ACETAMINOPHEN 10-325 MG PO TABS: 1.0000 | ORAL_TABLET | ORAL | 0 refills | Status: DC | PRN

## 2024-02-11 ENCOUNTER — Ambulatory Visit: Payer: Self-pay

## 2024-02-11 NOTE — Telephone Encounter (Signed)
 Patient was transferred to Nurse Triage due to stating she felt on the verge of a breakdown from recently losing her husband. Upon transfer, patient stated she did not want to speak to anyone and just wanted to make an appt with Dr. Tanya Nones. This RN assisted patient in making appt for earliest availability.   Copied from CRM [id number]. Topic: Clinical - Red Word Triage >> Feb 11, 2024  9:08 AM Marland Kitchen D wrote: Patient just loss her husband and says she's a mess and having a breakdown Reason for Disposition  Caller requesting an appointment, triage offered and declined  Answer Assessment - Initial Assessment Questions 1. REASON FOR CALL or QUESTION: "What is your reason for calling today?" or "How can I best help you?" or "What question do you have that I can help answer?"     See notes 2. CALLER: Document the source of call. (e.g., laboratory, patient).     Patient  Protocols used: PCP Call - No Triage-A-AH

## 2024-02-12 ENCOUNTER — Encounter: Payer: Self-pay | Admitting: Family Medicine

## 2024-02-12 ENCOUNTER — Telehealth: Payer: Self-pay

## 2024-02-12 ENCOUNTER — Ambulatory Visit: Admitting: Family Medicine

## 2024-02-12 ENCOUNTER — Other Ambulatory Visit: Payer: Self-pay | Admitting: Family Medicine

## 2024-02-12 VITALS — BP 130/94 | HR 88 | Temp 98.5°F | Ht 59.0 in | Wt 227.0 lb

## 2024-02-12 DIAGNOSIS — F4321 Adjustment disorder with depressed mood: Secondary | ICD-10-CM | POA: Diagnosis not present

## 2024-02-12 DIAGNOSIS — T7840XD Allergy, unspecified, subsequent encounter: Secondary | ICD-10-CM

## 2024-02-12 MED ORDER — CLONAZEPAM 0.5 MG PO TABS: 0.5000 mg | ORAL_TABLET | Freq: Three times a day (TID) | ORAL | 1 refills | Status: DC | PRN

## 2024-02-12 NOTE — Progress Notes (Signed)
 Subjective:    Patient ID: Faith Reeves, female    DOB: 1951/09/28, 73 y.o.   MRN: [id number]  Patient's husband recently died suddenly in his sleep.  Patient is extremely emotional today.  She reports feeling like she is shaking all over her body.  She reports feeling tremulous.  She reports extreme anxiety.  She also reports urinary sadness.  She tried taking Xanax however this medicine made her too sleepy.  She denies any chest pain or shortness of breath.   Past Medical History:  Diagnosis Date   Allergy    Anemia    Anxiety    Arthritis    Blood transfusion without reported diagnosis    Cataract    GERD (gastroesophageal reflux disease)    Hypertension    Hypertensive retinopathy    OU   Macular degeneration    Past Surgical History:  Procedure Laterality Date   ABDOMINAL HYSTERECTOMY     ACHILLES TENDON REPAIR Bilateral    APPENDECTOMY     BACK SURGERY     BICEPT TENODESIS Right 04/15/2023   Procedure: RIGHT BICEPS TENODESIS;  Surgeon: Cammy Copa, MD;  Location: [facility] OR;  Service: Orthopedics;  Laterality: Right;   BILATERAL CARPAL TUNNEL RELEASE     CHOLECYSTECTOMY     COLONOSCOPY     EYE SURGERY Right 09/18/2022   cataract extraction   HERNIA REPAIR     JOINT REPLACEMENT     MELANOMA EXCISION Right 09/15/2017   Procedure: EXCISION LIPOSARCOMA RIGHT ARM;  Surgeon: Violeta Gelinas, MD;  Location: [facility] OR;  Service: General;  Laterality: Right;   REVERSE SHOULDER ARTHROPLASTY Right 04/15/2023   Procedure: RIGHT REVERSE SHOULDER ARTHROPLASTY;  Surgeon: Cammy Copa, MD;  Location: [facility] OR;  Service: Orthopedics;  Laterality: Right;   SPINE SURGERY     Current Outpatient Medications on File Prior to Visit  Medication Sig Dispense Refill   albuterol (PROVENTIL HFA;VENTOLIN HFA) 108 (90 Base) MCG/ACT inhaler Inhale 2 puffs into the lungs every 4 (four) hours as needed for wheezing or shortness of breath. 1 Inhaler 0   ALPRAZolam (XANAX) 0.5 MG tablet Take 1  tablet (0.5 mg total) by mouth 3 (three) times daily as needed for anxiety. 30 tablet 0   ALPRAZolam (XANAX) 1 MG tablet Take 1 tablet (1 mg total) by mouth every 8 (eight) hours as needed for anxiety. 30 tablet 0   amoxicillin (AMOXIL) 875 MG tablet Take 875 mg by mouth 2 (two) times daily.     celecoxib (CELEBREX) 200 MG capsule TAKE 1 CAPSULE BY MOUTH EVERY DAY (Patient taking differently: Take 200 mg by mouth every other day.) 90 capsule 3   docusate sodium (COLACE) 100 MG capsule Take 1 capsule (100 mg total) by mouth 2 (two) times daily. 10 capsule 0   gabapentin (NEURONTIN) 300 MG capsule Take 1 capsule by mouth three times a day. 90 capsule 5   ipratropium (ATROVENT) 0.06 % nasal spray SPRAY 2 SPRAYS INTO EACH NOSTRIL 4 TIMES A DAY. 3 mL 1   levocetirizine (XYZAL) 5 MG tablet TAKE 1 TABLET BY MOUTH EVERY DAY IN THE EVENING 90 tablet 3   mometasone (ELOCON) 0.1 % cream Apply topically daily. 45 g 2   Multiple Vitamins-Minerals (EQ VISION FORMULA 50+ PO) Take 1 capsule by mouth daily.     oxyCODONE-acetaminophen (PERCOCET) 10-325 MG tablet Take 1 tablet by mouth every 4 (four) hours as needed for pain. 30 tablet 0   pantoprazole (PROTONIX) 40 MG tablet  Take 1 tablet (40 mg total) by mouth daily. Courtesy refill. Annual needed for future refills. 30 tablet 0   Semaglutide-Weight Management (WEGOVY) 0.5 MG/0.5ML SOAJ Inject 0.5 mg into the skin once a week. 2 mL 3   sertraline (ZOLOFT) 50 MG tablet TAKE 1 AND 1/2 TABLETS BY MOUTH DAILY 135 tablet 1   telmisartan-hydrochlorothiazide (MICARDIS HCT) 80-12.5 MG tablet Take 1 tablet by mouth daily. 90 tablet 1   traZODone (DESYREL) 50 MG tablet TAKE 1 TABLET BY MOUTH EVERYDAY AT BEDTIME 90 tablet 1   valACYclovir (VALTREX) 1000 MG tablet Take 2 tablets (2,000 mg total) by mouth 2 (two) times daily. 4 tablet 2   zolpidem (AMBIEN) 10 MG tablet TAKE 1 TABLET BY MOUTH EVERY DAY AT BEDTIME AS NEEDED FOR SLEEP 30 tablet 5   No current  facility-administered medications on file prior to visit.   Allergies  Allergen Reactions   Cymbalta [Duloxetine Hcl] Swelling   Social History   Socioeconomic History   Marital status: Married    Spouse name: Not on file   Number of children: Not on file   Years of education: Not on file   Highest education level: Not on file  Occupational History   Not on file  Tobacco Use   Smoking status: Never   Smokeless tobacco: Never  Vaping Use   Vaping status: Never Used  Substance and Sexual Activity   Alcohol use: Yes    Alcohol/week: 1.0 standard drink of alcohol    Types: 1 Glasses of wine per week   Drug use: No   Sexual activity: Never  Other Topics Concern   Not on file  Social History Narrative   Recent death of son; lives with husband; originally from [place]; most of family still lives in the [place]    Social Drivers of Health   Financial Resource Strain: Low Risk  (10/16/2023)   Overall Financial Resource Strain (CARDIA)    Difficulty of Paying Living Expenses: Not hard at all  Food Insecurity: No Food Insecurity (10/16/2023)   Hunger Vital Sign    Worried About Running Out of Food in the Last Year: Never true    Ran Out of Food in the Last Year: Never true  Transportation Needs: No Transportation Needs (10/16/2023)   PRAPARE - Administrator, Civil Service (Medical): No    Lack of Transportation (Non-Medical): No  Physical Activity: Insufficiently Active (10/16/2023)   Exercise Vital Sign    Days of Exercise per Week: 3 days    Minutes of Exercise per Session: 30 min  Stress: No Stress Concern Present (10/16/2023)   [employer] of Occupational Health - Occupational Stress Questionnaire    Feeling of Stress : Not at all  Social Connections: Moderately Isolated (10/16/2023)   Social Connection and Isolation Panel [NHANES]    Frequency of Communication with Friends and Family: More than three times a week    Frequency of Social Gatherings with  Friends and Family: More than three times a week    Attends Religious Services: Never    Database administrator or Organizations: No    Attends Banker Meetings: Never    Marital Status: Married  Catering manager Violence: Not At Risk (10/16/2023)   Humiliation, Afraid, Rape, and Kick questionnaire    Fear of Current or Ex-Partner: No    Emotionally Abused: No    Physically Abused: No    Sexually Abused: No     Review of  Systems  All other systems reviewed and are negative.      Objective:   Physical Exam Constitutional:      Appearance: She is obese.  Cardiovascular:     Rate and Rhythm: Normal rate and regular rhythm.     Heart sounds: Normal heart sounds.  Pulmonary:     Effort: Pulmonary effort is normal.     Breath sounds: Normal breath sounds.  Neurological:     Mental Status: She is alert.  Psychiatric:        Attention and Perception: Attention and perception normal.        Mood and Affect: Affect is tearful.        Speech: Speech normal.        Behavior: Behavior normal.        Cognition and Memory: Cognition and memory normal.        Judgment: Judgment normal.           Assessment & Plan:  Grief Discontinue Xanax and try replacing with Klonopin 0.5 mg 3 times a day as needed.  Hopefully this will help with her shaking and anxiety but not leave her as sleepy.  If this is not helpful considering propranolol.  I offered the patient my deepest condolences

## 2024-02-12 NOTE — Telephone Encounter (Signed)
 Copied from CRM [id number]. Topic: Clinical - Prescription Issue >> Feb 12, 2024  2:41 PM Freda Munro wrote: Reason for CRM: Patient is calling because pharmacy will not fill the prescription for clonazePAM (KLONOPIN) 0.5 MG tablet, the pharmacist is stating that he filled a different script 8 days ago and cannot fill this one without speaking to someone from the office. [id number] [place], [place] - [address] [place] [address] Phone: [phone] Fax: [phone] Hours: Not open 24 hours

## 2024-02-13 NOTE — Telephone Encounter (Signed)
 Requested medication (s) are due for refill today: yes for 90 day RF  Requested medication (s) are on the active medication list: yes  Last refill: 01/15/24  Future visit scheduled: no  Notes to clinic:  Medication not assigned to a protocol, review manually. Request 90 day supply     Requested Prescriptions  Pending Prescriptions Disp Refills   ipratropium (ATROVENT) 0.06 % nasal spray [Pharmacy Med Name: IPRATROPIUM 0.06% SPRAY]  1    Sig: SPRAY 2 SPRAYS INTO EACH NOSTRIL 4 TIMES A DAY.     Off-Protocol Failed - 02/13/2024 11:11 AM      Failed - Medication not assigned to a protocol, review manually.      Passed - Valid encounter within last 12 months    Recent Outpatient Visits           Yesterday Grief   [place] [facility] Tanya Nones, Priscille Heidelberg, MD   2 months ago Primary hypertension   [place] [facility] Donita Brooks, MD   6 months ago Benign essential HTN   [place] [facility] Tanya Nones, Priscille Heidelberg, MD   9 months ago Primary hypertension   [place] [facility] Tanya Nones, Priscille Heidelberg, MD   1 year ago Benign essential HTN   [place] [facility] Donita Brooks, MD             Off-Protocol Failed - 02/13/2024 11:11 AM      Failed - Medication not assigned to a protocol, review manually.      Passed - Valid encounter within last 12 months    Recent Outpatient Visits           Yesterday Grief   [facility] Tanya Nones, Priscille Heidelberg, MD   2 months ago Primary hypertension   [place] [facility] Donita Brooks, MD   6 months ago Benign essential HTN   [place] [facility] Tanya Nones, Priscille Heidelberg, MD   9 months ago Primary hypertension    [facility] Pickard, Priscille Heidelberg, MD   1 year ago Benign essential HTN    [facility] Medicine Pickard, Priscille Heidelberg, MD

## 2024-03-01 ENCOUNTER — Other Ambulatory Visit: Payer: Self-pay | Admitting: Family Medicine

## 2024-03-01 NOTE — Telephone Encounter (Signed)
 Copied from CRM [id number]. Topic: Clinical - Medication Refill >> Mar 01, 2024 11:17 AM Leory Rands wrote: Most Recent Primary Care Visit:  Provider: Eliane Grooms T  [facility]: [facility]  Visit Type: ACUTE  Date: 02/12/2024  Medication: oxyCODONE -acetaminophen  (PERCOCET) 10-325 MG tablet [[id number]]  Has the patient contacted their pharmacy? Yes (Agent: If no, request that the patient contact the pharmacy for the refill. If patient does not wish to contact the pharmacy document the reason why and proceed with request.) (Agent: If yes, when and what did the pharmacy advise?)  Is this the correct pharmacy for this prescription? Yes If no, delete pharmacy and type the correct one.  This is the patient's preferred pharmacy:  [id number] [place], [place] - [address] [place] [address] Phone: [phone] Fax: [phone]   Has the prescription been filled recently? Yes  Is the patient out of the medication? Yes  Has the patient been seen for an appointment in the last year OR does the patient have an upcoming appointment? Yes  Can we respond through MyChart? Yes  Agent: Please be advised that Rx refills may take up to 3 business days. We ask that you follow-up with your pharmacy.

## 2024-03-03 NOTE — Telephone Encounter (Signed)
 Requested medication (s) are due for refill today: yes  Requested medication (s) are on the active medication list: yes  Last refill:  02/05/24  Future visit scheduled:  Notes to clinic:  Unable to refill per protocol, cannot delegate.      Requested Prescriptions  Pending Prescriptions Disp Refills   oxyCODONE -acetaminophen  (PERCOCET) 10-325 MG tablet 30 tablet 0    Sig: Take 1 tablet by mouth every 4 (four) hours as needed for pain.     Not Delegated - Analgesics:  Opioid Agonist Combinations Failed - 03/03/2024  1:06 PM      Failed - This refill cannot be delegated      Failed - Urine Drug Screen completed in last 360 days      Failed - Valid encounter within last 3 months    Recent Outpatient Visits           2 weeks ago Grief   [place] [facility] Cheril Cork, Cisco Crest, MD   3 months ago Primary hypertension   [place] [facility] Austine Lefort, MD   7 months ago Benign essential HTN   [place] [facility] Cheril Cork, Cisco Crest, MD   9 months ago Primary hypertension   [place] [facility] Cheril Cork, Cisco Crest, MD   1 year ago Benign essential HTN   [place] [facility] Medicine Pickard, Cisco Crest, MD

## 2024-03-04 MED ORDER — OXYCODONE-ACETAMINOPHEN 10-325 MG PO TABS: 1.0000 | ORAL_TABLET | ORAL | 0 refills | Status: DC | PRN

## 2024-03-25 ENCOUNTER — Other Ambulatory Visit: Payer: Self-pay | Admitting: Family Medicine

## 2024-03-25 NOTE — Telephone Encounter (Signed)
 Copied from CRM [id number]. Topic: Clinical - Medication Refill >> Mar 25, 2024 12:13 PM Rosaria Common wrote: Medication: pantoprazole  (PROTONIX ) 40 MG tablet  Has the patient contacted their pharmacy? Yes (Agent: If no, request that the patient contact the pharmacy for the refill. If patient does not wish to contact the pharmacy document the reason why and proceed with request.) (Agent: If yes, when and what did the pharmacy advise?)  This is the patient's preferred pharmacy:  [id number] [place], [place] - [address] [place] [address] Phone: [phone] Fax: [phone]  Is this the correct pharmacy for this prescription? Yes If no, delete pharmacy and type the correct one.   Has the prescription been filled recently? No  Is the patient out of the medication? No  Has the patient been seen for an appointment in the last year OR does the patient have an upcoming appointment? Yes  Can we respond through MyChart? No  Agent: Please be advised that Rx refills may take up to 3 business days. We ask that you follow-up with your pharmacy.

## 2024-03-26 MED ORDER — PANTOPRAZOLE SODIUM 40 MG PO TBEC
40.0000 mg | DELAYED_RELEASE_TABLET | Freq: Every day | ORAL | 1 refills | Status: DC
Start: 2024-03-26 — End: 2024-07-07

## 2024-03-26 NOTE — Telephone Encounter (Signed)
 Requested Prescriptions  Pending Prescriptions Disp Refills   pantoprazole  (PROTONIX ) 40 MG tablet 90 tablet 0    Sig: Take 1 tablet (40 mg total) by mouth daily. Courtesy refill. Annual needed for future refills.     Gastroenterology: Proton Pump Inhibitors Passed - 03/26/2024  2:58 PM      Passed - Valid encounter within last 12 months    Recent Outpatient Visits           1 month ago Grief   [place] [facility] Family Medicine Pickard, Cisco Crest, MD   4 months ago Primary hypertension   [place] [facility] Medicine Austine Lefort, MD   8 months ago Benign essential HTN   [place] [facility] Cheril Cork, Cisco Crest, MD   10 months ago Primary hypertension   [place] [facility] Medicine Pickard, Cisco Crest, MD   1 year ago Benign essential HTN   [place] [facility] Pickard, Cisco Crest, MD

## 2024-03-31 ENCOUNTER — Other Ambulatory Visit: Payer: Self-pay | Admitting: Family Medicine

## 2024-03-31 NOTE — Telephone Encounter (Signed)
 Copied from CRM [id number]. Topic: Clinical - Medication Refill >> Mar 31, 2024  8:39 AM Crispin Dolphin wrote: Medication: oxyCODONE -acetaminophen  (PERCOCET) 10-325 MG tablet  Has the patient contacted their pharmacy? Yes (Agent: If no, request that the patient contact the pharmacy for the refill. If patient does not wish to contact the pharmacy document the reason why and proceed with request.) (Agent: If yes, when and what did the pharmacy advise?)  This is the patient's preferred pharmacy:  [id number] [place], [place] - [address] [facility] [address] [place] [address] Phone: [phone] Fax: [phone]  Is this the correct pharmacy for this prescription? Yes If no, delete pharmacy and type the correct one.   Has the prescription been filled recently? Yes  Is the patient out of the medication? No - 3 left   Has the patient been seen for an appointment in the last year OR does the patient have an upcoming appointment? Yes  Can we respond through MyChart? No - call once complete. Thank You   Agent: Please be advised that Rx refills may take up to 3 business days. We ask that you follow-up with your pharmacy.

## 2024-04-02 NOTE — Telephone Encounter (Signed)
 Requested medication (s) are due for refill today: yes  Requested medication (s) are on the active medication list: yes  Last refill:  03/04/24  Future visit scheduled: no  Notes to clinic:  Unable to refill per protocol, cannot delegate.      Requested Prescriptions  Pending Prescriptions Disp Refills   oxyCODONE -acetaminophen  (PERCOCET) 10-325 MG tablet 30 tablet 0    Sig: Take 1 tablet by mouth every 4 (four) hours as needed for pain.     Not Delegated - Analgesics:  Opioid Agonist Combinations Failed - 04/02/2024 11:54 AM      Failed - This refill cannot be delegated      Failed - Urine Drug Screen completed in last 360 days      Failed - Valid encounter within last 3 months    Recent Outpatient Visits           1 month ago Grief   [place] [facility] Cheril Cork, Cisco Crest, MD   4 months ago Primary hypertension   [place] [facility] Austine Lefort, MD   8 months ago Benign essential HTN   [place] [facility] Cheril Cork, Cisco Crest, MD   10 months ago Primary hypertension   [place] [facility] Cheril Cork, Cisco Crest, MD   1 year ago Benign essential HTN   [place] [facility] Pickard, Cisco Crest, MD

## 2024-04-05 ENCOUNTER — Other Ambulatory Visit: Payer: Self-pay | Admitting: Family Medicine

## 2024-04-05 MED ORDER — OXYCODONE-ACETAMINOPHEN 10-325 MG PO TABS: 1.0000 | ORAL_TABLET | ORAL | 0 refills | Status: DC | PRN

## 2024-04-05 NOTE — Telephone Encounter (Signed)
 Requested medication (s) are due for refill today: yes  Requested medication (s) are on the active medication list: yes  Last refill:    Future visit scheduled: no  Notes to clinic:  Unable to refill per protocol, cannot delegate.      Requested Prescriptions  Pending Prescriptions Disp Refills   oxyCODONE -acetaminophen  (PERCOCET) 10-325 MG tablet 30 tablet 0    Sig: Take 1 tablet by mouth every 4 (four) hours as needed for pain.     Not Delegated - Analgesics:  Opioid Agonist Combinations Failed - 04/05/2024  8:03 AM      Failed - This refill cannot be delegated      Failed - Urine Drug Screen completed in last 360 days      Failed - Valid encounter within last 3 months    Recent Outpatient Visits           1 month ago Grief   [place] [facility] Cheril Cork, Cisco Crest, MD   4 months ago Primary hypertension   [facility] Austine Lefort, MD   8 months ago Benign essential HTN   [place] [facility] Cheril Cork, Cisco Crest, MD   10 months ago Primary hypertension    [facility] Cheril Cork, Cisco Crest, MD   1 year ago Benign essential HTN    [facility] Pickard, Cisco Crest, MD

## 2024-04-05 NOTE — Telephone Encounter (Signed)
 Copied from CRM [id number]. Topic: Clinical - Medication Refill >> Mar 31, 2024  8:39 AM Crispin Dolphin wrote: Medication: oxyCODONE -acetaminophen  (PERCOCET) 10-325 MG tablet  Has the patient contacted their pharmacy? Yes (Agent: If no, request that the patient contact the pharmacy for the refill. If patient does not wish to contact the pharmacy document the reason why and proceed with request.) (Agent: If yes, when and what did the pharmacy advise?)  This is the patient's preferred pharmacy:  [id number] [place], [place] - [address] [place] [address] Phone: [phone] Fax: [phone]  Is this the correct pharmacy for this prescription? Yes If no, delete pharmacy and type the correct one.   Has the prescription been filled recently? Yes  Is the patient out of the medication? No - 3 left   Has the patient been seen for an appointment in the last year OR does the patient have an upcoming appointment? Yes  Can we respond through MyChart? No - call once complete. Thank You   Agent: Please be advised that Rx refills may take up to 3 business days. We ask that you follow-up with your pharmacy. >> Apr 05, 2024  7:54 AM Lizabeth Riggs wrote: The medication list above has not been sent to her pharmacy per chart. She is out of medication. Please sent order to refill or call patient to let her know why medication can not be refilled. Thanks

## 2024-04-05 NOTE — Telephone Encounter (Signed)
 Per provider. Medication Oxycodone -Acetaminophen  Approved and sent to pharmacy. First attempt to contact pt. W/ no answer. Lvm for callback if there are questions.

## 2024-04-07 ENCOUNTER — Encounter (INDEPENDENT_AMBULATORY_CARE_PROVIDER_SITE_OTHER): Admitting: Ophthalmology

## 2024-04-14 NOTE — Progress Notes (Signed)
 [facility] - Clinic Note  04/23/2024   CHIEF COMPLAINT Patient presents for Retina Follow Up  HISTORY OF PRESENT ILLNESS: Faith Reeves is a 73 y.o. female who presents to the clinic today for:   HPI     Retina Follow Up   Patient presents with  Wet AMD.  In right eye.  This started 13 weeks ago.  I, the attending physician,  performed the HPI with the patient and updated documentation appropriately.        Comments   Patient here for 13 weeks retina follow up for exu ARMD OD. Patient states vision not as good. lost husband and has been crying last couple of months and today was his birthday makes it hard. OD as a little bit of eye pain due to crying. On Clonazepam  .05 three times a day.      Last edited by Cesily Cuoco, MD on 04/23/2024  4:13 PM.    Pts husband died in 2024/03/04 and today is his birthday so she is having a hard day, she's unsure how her vision has been   Referring physician: Austine Lefort, MD [address] [place],  [place] [address]  HISTORICAL INFORMATION:   Selected notes from the MEDICAL RECORD NUMBER Referred by Dr. Donovan Gallant for concern of exu ARMD OU   CURRENT MEDICATIONS: No current outpatient medications on file. (Ophthalmic Drugs)   No current facility-administered medications for this visit. (Ophthalmic Drugs)   Current Outpatient Medications (Other)  Medication Sig   albuterol  (PROVENTIL  HFA;VENTOLIN  HFA) 108 (90 Base) MCG/ACT inhaler Inhale 2 puffs into the lungs every 4 (four) hours as needed for wheezing or shortness of breath.   amoxicillin  (AMOXIL ) 875 MG tablet Take 875 mg by mouth 2 (two) times daily.   celecoxib  (CELEBREX ) 200 MG capsule TAKE 1 CAPSULE BY MOUTH EVERY DAY (Patient taking differently: Take 200 mg by mouth every other day.)   clonazePAM  (KLONOPIN ) 0.5 MG tablet Take 1 tablet (0.5 mg total) by mouth 3 (three) times daily as needed for anxiety.   docusate sodium  (COLACE) 100 MG capsule Take 1  capsule (100 mg total) by mouth 2 (two) times daily.   gabapentin  (NEURONTIN ) 300 MG capsule Take 1 capsule by mouth three times a day.   ipratropium (ATROVENT ) 0.06 % nasal spray SPRAY 2 SPRAYS INTO EACH NOSTRIL 4 TIMES A DAY   levocetirizine (XYZAL ) 5 MG tablet TAKE 1 TABLET BY MOUTH EVERY DAY IN THE EVENING   mometasone  (ELOCON ) 0.1 % cream Apply topically daily.   Multiple Vitamins-Minerals (EQ VISION FORMULA 50+ PO) Take 1 capsule by mouth daily.   oxyCODONE -acetaminophen  (PERCOCET) 10-325 MG tablet Take 1 tablet by mouth every 4 (four) hours as needed for pain.   pantoprazole  (PROTONIX ) 40 MG tablet Take 1 tablet (40 mg total) by mouth daily. Courtesy refill. Annual needed for future refills.   Semaglutide -Weight Management (WEGOVY ) 0.5 MG/0.5ML SOAJ Inject 0.5 mg into the skin once a week.   sertraline  (ZOLOFT ) 50 MG tablet TAKE 1 AND 1/2 TABLETS BY MOUTH DAILY   telmisartan -hydrochlorothiazide  (MICARDIS  HCT) 80-12.5 MG tablet TAKE 1 TABLET BY MOUTH EVERY DAY   traZODone  (DESYREL ) 50 MG tablet TAKE 1 TABLET BY MOUTH EVERYDAY AT BEDTIME   valACYclovir  (VALTREX ) 1000 MG tablet Take 2 tablets (2,000 mg total) by mouth 2 (two) times daily.   zolpidem  (AMBIEN ) 10 MG tablet TAKE 1 TABLET BY MOUTH EVERY DAY AT BEDTIME AS NEEDED FOR SLEEP  No current facility-administered medications for this visit. (Other)   REVIEW OF SYSTEMS: ROS   Positive for: Gastrointestinal, Neurological, Eyes Negative for: Constitutional, Skin, Genitourinary, Musculoskeletal, HENT, Endocrine, Cardiovascular, Respiratory, Psychiatric, Allergic/Imm, Heme/Lymph Last edited by Sylvan Evener, COA on 04/23/2024  1:06 PM.      ALLERGIES Allergies  Allergen Reactions   Cymbalta [Duloxetine Hcl] Swelling   PAST MEDICAL HISTORY Past Medical History:  Diagnosis Date   Allergy    Anemia    Anxiety    Arthritis    Blood transfusion without reported diagnosis    Cataract    GERD (gastroesophageal reflux disease)     Hypertension    Hypertensive retinopathy    OU   Macular degeneration    Past Surgical History:  Procedure Laterality Date   ABDOMINAL HYSTERECTOMY     ACHILLES TENDON REPAIR Bilateral    APPENDECTOMY     BACK SURGERY     BICEPT TENODESIS Right 04/15/2023   Procedure: RIGHT BICEPS TENODESIS;  Surgeon: Jasmine Mesi, MD;  Location: [facility] OR;  Service: Orthopedics;  Laterality: Right;   BILATERAL CARPAL TUNNEL RELEASE     CHOLECYSTECTOMY     COLONOSCOPY     EYE SURGERY Right 09/18/2022   cataract extraction   HERNIA REPAIR     JOINT REPLACEMENT     MELANOMA EXCISION Right 09/15/2017   Procedure: EXCISION LIPOSARCOMA RIGHT ARM;  Surgeon: Dorena Gander, MD;  Location: [facility] OR;  Service: General;  Laterality: Right;   REVERSE SHOULDER ARTHROPLASTY Right 04/15/2023   Procedure: RIGHT REVERSE SHOULDER ARTHROPLASTY;  Surgeon: Jasmine Mesi, MD;  Location: [facility] OR;  Service: Orthopedics;  Laterality: Right;   SPINE SURGERY     FAMILY HISTORY Family History  Problem Relation Age of Onset   Hypertension Mother    Cancer Sister    Stroke Maternal Grandmother    SOCIAL HISTORY Social History   Tobacco Use   Smoking status: Never   Smokeless tobacco: Never  Vaping Use   Vaping status: Never Used  Substance Use Topics   Alcohol use: Yes    Alcohol/week: 1.0 standard drink of alcohol    Types: 1 Glasses of wine per week   Drug use: No       OPHTHALMIC EXAM: Base Eye Exam     Visual Acuity (Snellen - Linear)       Right Left   Dist cc 20/30 -2 20/20 -2    Correction: Glasses         Tonometry (Tonopen, 1:02 PM)       Right Left   Pressure 15 19         Pupils       Dark Light Shape React APD   Right 3 2 Round Brisk None   Left 3 2 Round Brisk None         Visual Fields (Counting fingers)       Left Right    Full Full         Extraocular Movement       Right Left    Full, Ortho Full, Ortho         Neuro/Psych     Oriented x3: Yes    Mood/Affect: Normal         Dilation     Both eyes: 1.0% Mydriacyl, 2.5% Phenylephrine  @ 1:02 PM           Slit Lamp and Fundus Exam     Slit Lamp Exam  Right Left   Lids/Lashes Dermatochalasis - upper lid, mild Meibomian gland dysfunction Dermatochalasis - upper lid, mild Meibomian gland dysfunction   Conjunctiva/Sclera White and quiet White and quiet   Cornea 2-3+ fine Punctate epithelial erosions 2+ fine Punctate epithelial erosions inferiorly   Anterior Chamber deep, clear, narrow temporal angle deep, clear, narrow temporal angle   Iris Round and dilated Round and dilated   Lens PCIOL in good position 2-3+ Nuclear sclerosis, 2-3+ Cortical cataract   Anterior Vitreous Vitreous syneresis, Posterior vitreous detachment, vitreous condensations Vitreous syneresis, Posterior vitreous detachment, mild vitreous condensations         Fundus Exam       Right Left   Disc Pink and Sharp, Compact, +PPP Pink and Sharp, temporal PPP, Compact   C/D Ratio 0.2 0.1   Macula Blunted foveal reflex, drusen, RPE mottling, clumping and atrophy, +CNV, Stable resolution of central SRF, No heme, persistent low PED Flat, good foveal reflex, scattered Drusen, RPE mottling and clumping, focal PEDs, No heme or edema   Vessels attenuated, Tortuous attenuated, Tortuous   Periphery Attached, mild reticular degeneration, no heme Attached, no heme           Refraction     Wearing Rx       Sphere Cylinder Axis Add   Right +2.00 +0.50 008 +2.50   Left +2.50 +1.00 154 +2.50    Type: PAL           IMAGING AND PROCEDURES  Imaging and Procedures for @TODAY @  OCT, Retina - OU - Both Eyes       Right Eye Quality was good. Central Foveal Thickness: 214. Progression has been stable. Findings include normal foveal contour, no IRF, no SRF, retinal drusen , pigment epithelial detachment, outer retinal atrophy (Stable improvement in central SRF overlying low, stable PEDs, central ORA).    Left Eye Quality was good. Central Foveal Thickness: 270. Progression has been stable. Findings include normal foveal contour, no IRF, no SRF, retinal drusen .   Notes *Images captured and stored on drive  Diagnosis / Impression:  OD: stable improvement in central SRF overlying low, stable PEDs, central ORA OS: non-exu ARMD -- stable  Clinical management:  See below  Abbreviations: NFP - Normal foveal profile. CME - cystoid macular edema. PED - pigment epithelial detachment. IRF - intraretinal fluid. SRF - subretinal fluid. EZ - ellipsoid zone. ERM - epiretinal membrane. ORA - outer retinal atrophy. ORT - outer retinal tubulation. SRHM - subretinal hyper-reflective material     Intravitreal Injection, Pharmacologic Agent - OD - Right Eye       Time Out 04/23/2024. 1:55 PM. Confirmed correct patient, procedure, site, and patient consented.   Anesthesia Topical anesthesia was used. Anesthetic medications included Lidocaine  2%, Proparacaine 0.5%.   Procedure Preparation included 5% betadine  to ocular surface, eyelid speculum. A (32g) needle was used.   Injection: 6 mg faricimab -svoa 6 MG/0.05ML   Route: Intravitreal, Site: Right Eye   NDC: 16109-604-54, Lot: [id number], Expiration date: 03/03/2025, Waste: 0 mL   Post-op Post injection exam found visual acuity of at least counting fingers. The patient tolerated the procedure well. There were no complications. The patient received written and verbal post procedure care education. Post injection medications were not given.             ASSESSMENT/PLAN:    ICD-10-CM   1. Exudative age-related macular degeneration of right eye with active choroidal neovascularization (HCC)  H35.3211 OCT, Retina - OU - Both Eyes  Intravitreal Injection, Pharmacologic Agent - OD - Right Eye    faricimab -svoa (VABYSMO ) 6mg /0.47mL intravitreal injection    2. Intermediate stage nonexudative age-related macular degeneration of left eye  H35.3122      3. Essential hypertension  I10     4. Hypertensive retinopathy of both eyes  H35.033     5. Combined forms of age-related cataract of left eye  H25.812     6. Pseudophakia  Z96.1     7. Glaucoma suspect of both eyes  H40.003     8. Dry eyes  H04.123       1. Exudative age related macular degeneration, right eye  - FA (03.10.20) shows +CNV w/ staining/leakage  - repeat FA 09.13.21 shows interval increase in perifoveal leakage OD - s/p IVA OD #1 (03.10.20), #2 (04.13.20), #3 (05.11.20), #4 (06.23.20), #5 (8.18.20), #6 (10.27.20), #7 (01.26.21), #8 (04.26.21), #9 (08.02.21), #10 (09.13.21), #11 (10.11.21), #12 (11.9.21), #13 (12.14.21), #14 (1.24.22), #15 (3.15.22), #16 (05.24.22), #17 (8.9.22), #18 (10.18.22), #19 (01.03.23), #20 (03.29.23) -- IVA resistance ============================= - s/p IVE OD #1 (06.07.23), #2 (07.19.23), #3 (09.01.23), #4 (10.13.23), #5 (11.10.23), #6 (12.22.23) -- IVE resistance ==============================  - s/p IVV OD #1 (02.02.24), #2 (03.15.24), #3 (05.01.24), #4 (06.05.24), #5 (07.17.24), #6 (09.11.24), #7 (10.06.24), #8 (01.08.25), #8 (03.19.25) - pt was doing very well with q3 mo maintenance injections, but then developed interval increases in central SRF over several visits -- IVA and IVE resistance  - BCVA OD 20/30 -- stable - OCT today stable improvement in central SRF overlying low, stable PEDs at 13 weeks   - recommend IVV OD #9 today, 06.20.25 w/ f/u in 12-13 wks  - pt wishes to be treated with IVV OD  - RBA of procedure discussed, questions answered  - IVV informed consent obtained and signed, 02.02.24 (OD)  - see procedure note  - Eylea  is being paid for through Medicare and [employer]   - f/u in 12-13 wks -- DFE/OCT/possible injection, tx and ext as able  2. Age related macular degeneration, non-exudative, left eye - The incidence, anatomy, and pathology of dry AMD, risk of progression, and the AREDS and AREDS 2 study including smoking  risks discussed with patient.  - intermediate stage  - recommend amsler grid monitoring  3,4. Hypertensive retinopathy OU  - discussed importance of tight BP control  - monitor  5. Mixed form age related cataracts OS - The symptoms of cataract, surgical options, and treatments and risks were discussed with patient  - under the expert management of Dr. Carloyn Chi  6. Pseudophakia OD  - s/p CE/IOL (Dr. Carloyn Chi, 11.15.23)  - IOL in good position, doing well  - monitor  7. Glaucoma Suspect OU  - IOP 15,19  - under the expert management of Dr. Carloyn Chi  8. Dry eyes OU  - decreased vision OD today mostly due to increased PEE / dry eyes  - pt reports stopping ATs after hearing about product recalls on ATs - recommend artificial tears and lubricating ointment as needed   Return for f/u 12-13 weeks, exu ARMD OD, DFE, OCT, Possible Injxn. '  Ophthalmic Meds Ordered this visit:  Meds ordered this encounter  Medications   faricimab -svoa (VABYSMO ) 6mg /0.2mL intravitreal injection      This document serves as a record of services personally performed by Jeanice Millard, MD, PhD. It was created on their behalf by Angelia Kelp, an ophthalmic technician. The creation of this record is the provider's dictation and/or activities during the  visit.    Electronically signed by: Angelia Kelp, OA, 04/23/24  4:18 PM  This document serves as a record of services personally performed by Jeanice Millard, MD, PhD. It was created on their behalf by Morley Arabia. Bevin Bucks, OA an ophthalmic technician. The creation of this record is the provider's dictation and/or activities during the visit.    Electronically signed by: Morley Arabia. Bevin Bucks, OA 04/23/24 4:18 PM   Jeanice Millard, M.D., Ph.D. Diseases & Surgery of the Retina and Vitreous [facility]  I have reviewed the above documentation for accuracy and completeness, and I agree with the above. Jeanice Millard, M.D., Ph.D. 04/23/24 4:18 PM    Abbreviations: M myopia (nearsighted); A astigmatism; H hyperopia (farsighted); P presbyopia; Mrx spectacle prescription;  CTL contact lenses; OD right eye; OS left eye; OU both eyes  XT exotropia; ET esotropia; PEK punctate epithelial keratitis; PEE punctate epithelial erosions; DES dry eye syndrome; MGD meibomian gland dysfunction; ATs artificial tears; PFAT's preservative free artificial tears; NSC nuclear sclerotic cataract; PSC posterior subcapsular cataract; ERM epi-retinal membrane; PVD posterior vitreous detachment; RD retinal detachment; DM diabetes mellitus; DR diabetic retinopathy; NPDR non-proliferative diabetic retinopathy; PDR proliferative diabetic retinopathy; CSME clinically significant macular edema; DME diabetic macular edema; dbh dot blot hemorrhages; CWS cotton wool spot; POAG primary open angle glaucoma; C/D cup-to-disc ratio; HVF humphrey visual field; GVF goldmann visual field; OCT optical coherence tomography; IOP intraocular pressure; BRVO Branch retinal vein occlusion; CRVO central retinal vein occlusion; CRAO central retinal artery occlusion; BRAO branch retinal artery occlusion; RT retinal tear; SB scleral buckle; PPV pars plana vitrectomy; VH Vitreous hemorrhage; PRP panretinal laser photocoagulation; IVK intravitreal kenalog ; VMT vitreomacular traction; MH Macular hole;  NVD neovascularization of the disc; NVE neovascularization elsewhere; AREDS age related eye disease study; ARMD age related macular degeneration; POAG primary open angle glaucoma; EBMD epithelial/anterior basement membrane dystrophy; ACIOL anterior chamber intraocular lens; IOL intraocular lens; PCIOL posterior chamber intraocular lens; Phaco/IOL phacoemulsification with intraocular lens placement; PRK photorefractive keratectomy; LASIK laser assisted in situ keratomileusis; HTN hypertension; DM diabetes mellitus; COPD chronic obstructive pulmonary disease

## 2024-04-22 ENCOUNTER — Other Ambulatory Visit: Payer: Self-pay | Admitting: Family Medicine

## 2024-04-22 DIAGNOSIS — I10 Essential (primary) hypertension: Secondary | ICD-10-CM

## 2024-04-23 ENCOUNTER — Ambulatory Visit (INDEPENDENT_AMBULATORY_CARE_PROVIDER_SITE_OTHER): Admitting: Ophthalmology

## 2024-04-23 ENCOUNTER — Encounter (INDEPENDENT_AMBULATORY_CARE_PROVIDER_SITE_OTHER): Payer: Self-pay | Admitting: Ophthalmology

## 2024-04-23 DIAGNOSIS — H25812 Combined forms of age-related cataract, left eye: Secondary | ICD-10-CM | POA: Diagnosis not present

## 2024-04-23 DIAGNOSIS — H40003 Preglaucoma, unspecified, bilateral: Secondary | ICD-10-CM

## 2024-04-23 DIAGNOSIS — I10 Essential (primary) hypertension: Secondary | ICD-10-CM

## 2024-04-23 DIAGNOSIS — H353211 Exudative age-related macular degeneration, right eye, with active choroidal neovascularization: Secondary | ICD-10-CM | POA: Diagnosis not present

## 2024-04-23 DIAGNOSIS — H353122 Nonexudative age-related macular degeneration, left eye, intermediate dry stage: Secondary | ICD-10-CM

## 2024-04-23 DIAGNOSIS — H35033 Hypertensive retinopathy, bilateral: Secondary | ICD-10-CM

## 2024-04-23 DIAGNOSIS — H04123 Dry eye syndrome of bilateral lacrimal glands: Secondary | ICD-10-CM | POA: Diagnosis not present

## 2024-04-23 DIAGNOSIS — Z961 Presence of intraocular lens: Secondary | ICD-10-CM

## 2024-04-23 MED ORDER — FARICIMAB-SVOA 6 MG/0.05ML IZ SOSY
6.0000 mg | PREFILLED_SYRINGE | INTRAVITREAL | Status: AC | PRN
  Administered 2024-04-23: 6 mg via INTRAVITREAL

## 2024-05-03 ENCOUNTER — Telehealth: Payer: Self-pay | Admitting: Family Medicine

## 2024-05-03 NOTE — Telephone Encounter (Unsigned)
 Copied from CRM [id number]. Topic: Clinical - Medication Refill >> May 03, 2024  7:43 AM Montie POUR wrote: Medication: oxyCODONE -acetaminophen  (PERCOCET) 10-325 MG tablet AND zolpidem  (AMBIEN ) 10 MG tablet  Has the patient contacted their pharmacy? Yes (Agent: If no, request that the patient contact the pharmacy for the refill. If patient does not wish to contact the pharmacy document the reason why and proceed with request.) (Agent: If yes, when and what did the pharmacy advise?) Pharmacy needs order to refill  This is the patient's preferred pharmacy:  [id number] [place], [place] - [address] [place] [address] Phone: [phone] Fax: [phone]  Is this the correct pharmacy for this prescription? Yes If no, delete pharmacy and type the correct one.   Has the prescription been filled recently? No  Is the patient out of the medication? Yes  Has the patient been seen for an appointment in the last year OR does the patient have an upcoming appointment? Yes  Can we respond through MyChart? No  Agent: Please be advised that Rx refills may take up to 3 business days. We ask that you follow-up with your pharmacy.

## 2024-05-04 NOTE — Telephone Encounter (Signed)
 Requested medications are due for refill today.  yes  Requested medications are on the active medications list.  yes  Last refill. Percocet 04/05/2024 #30 0 rf, Ambien  11/24/2023 #30 5 rf  Future visit scheduled.   no  Notes to clinic.  Refill not delegated.     Requested Prescriptions  Pending Prescriptions Disp Refills   oxyCODONE -acetaminophen  (PERCOCET) 10-325 MG tablet 30 tablet 0    Sig: Take 1 tablet by mouth every 4 (four) hours as needed for pain.     Not Delegated - Analgesics:  Opioid Agonist Combinations Failed - 05/04/2024  4:18 PM      Failed - This refill cannot be delegated      Failed - Urine Drug Screen completed in last 360 days      Failed - Valid encounter within last 3 months    Recent Outpatient Visits           2 months ago Grief   [place] [facility] Pickard, Butler DASEN, MD   5 months ago Primary hypertension   [place] [facility] Du[facility] Butler DASEN, MD   9 months ago Benign essential HTN   [place] [facility] Duanne, Butler DASEN, MD   11 months ago Primary hypertension   [place] [facility] Duanne, Butler DASEN, MD   1 year ago Benign essential HTN   [place] [facility] Pickard, Butler DASEN, MD               zolpidem  (AMBIEN ) 10 MG tablet 30 tablet 5    Sig: TAKE 1 TABLET BY MOUTH EVERY DAY AT BEDTIME AS NEEDED FOR SLEEP     Not Delegated - Psychiatry:  Anxiolytics/Hypnotics Failed - 05/04/2024  4:18 PM      Failed - This refill cannot be delegated      Failed - Urine Drug Screen completed in last 360 days      Failed - Valid encounter within last 6 months    Recent Outpatient Visits           2 months ago Grief   [place] [facility] Duanne Butler DASEN, MD   5 months ago Primary hypertension   [place] [facility] Duanne Butler DASEN, MD   9 months ago Benign essential HTN   [place] [facility] Duanne, Butler DASEN, MD   11 months ago Primary hypertension   [place] [facility] Duanne, Butler DASEN, MD   1 year ago Benign essential HTN    [facility] Pickard, Butler DASEN, MD

## 2024-05-05 MED ORDER — OXYCODONE-ACETAMINOPHEN 10-325 MG PO TABS: 1.0000 | ORAL_TABLET | ORAL | 0 refills | Status: DC | PRN

## 2024-05-05 MED ORDER — ZOLPIDEM TARTRATE 10 MG PO TABS: ORAL_TABLET | ORAL | 5 refills | Status: DC

## 2024-05-06 NOTE — Telephone Encounter (Signed)
 Medication request responded to. Provider reviewed medications filled new refill and sent to pt. Pharmacy [facility]. On [address] in [place].

## 2024-05-18 ENCOUNTER — Telehealth: Payer: Self-pay | Admitting: Family Medicine

## 2024-05-18 ENCOUNTER — Other Ambulatory Visit: Payer: Self-pay

## 2024-05-18 DIAGNOSIS — M4120 Other idiopathic scoliosis, site unspecified: Secondary | ICD-10-CM

## 2024-05-18 MED ORDER — GABAPENTIN 300 MG PO CAPS: ORAL_CAPSULE | ORAL | 5 refills | Status: DC

## 2024-05-18 NOTE — Telephone Encounter (Signed)
 Prescription Request  05/18/2024  LOV: 02/12/2024  What is the name of the medication or equipment?   gabapentin  (NEURONTIN ) 300 MG capsule  **90 day script request**  Have you contacted your pharmacy to request a refill? Yes   Which pharmacy would you like this sent to?  [id number] [address], [place] - [address] [place] [address] Phone: [phone] Fax: [phone]    Patient notified that their request is being sent to the clinical staff for review and that they should receive a response within 2 business days.   Please advise pharmacist.

## 2024-05-22 ENCOUNTER — Other Ambulatory Visit: Payer: Self-pay | Admitting: Family Medicine

## 2024-05-22 DIAGNOSIS — L299 Pruritus, unspecified: Secondary | ICD-10-CM

## 2024-05-25 NOTE — Telephone Encounter (Signed)
 Requested medications are due for refill today.  yes  Requested medications are on the active medications list.  yes  Last refill. 09/12/2023 45 g 2 rf  Future visit scheduled.   no  Notes to clinic.  Medication not assigned to a protocol. Please review for refill.    Requested Prescriptions  Pending Prescriptions Disp Refills   mometasone  (ELOCON ) 0.1 % cream [Pharmacy Med Name: MOMETASONE  FUROATE 0.1% CREAM] 45 g 2    Sig: APPLY TO AFFECTED AREA TOPICALLY EVERY DAY     Off-Protocol Failed - 05/25/2024 10:45 AM      Failed - Medication not assigned to a protocol, review manually.      Passed - Valid encounter within last 12 months    Recent Outpatient Visits           3 months ago Grief   [place] [facility] Pickard, Butler DASEN, MD   6 months ago Primary hypertension   [facility] Duanne, Butler DASEN, MD   10 months ago Benign essential HTN   [place] [facility] Pickard, Butler DASEN, MD   1 year ago Primary hypertension   [employer] [facility] Duanne, Butler DASEN, MD   1 year ago Benign essential HTN    [facility] Pickard, Butler DASEN, MD

## 2024-05-31 ENCOUNTER — Other Ambulatory Visit: Payer: Self-pay | Admitting: Family Medicine

## 2024-05-31 NOTE — Telephone Encounter (Signed)
 05/05/24 30 tabs/0 RF (5 day)

## 2024-05-31 NOTE — Telephone Encounter (Unsigned)
 Copied from CRM [id number]. Topic: Clinical - Medication Refill >> May 31, 2024 11:00 AM Elle L wrote: Medication: oxyCODONE -acetaminophen  (PERCOCET) 10-325 MG tablet  Has the patient contacted their pharmacy? Yes  This is the patient's preferred pharmacy:  [id number] [place], [place] - [address] [place] [address] Phone: [phone] Fax: [phone]  Is this the correct pharmacy for this prescription? Yes  Has the prescription been filled recently? Yes  Is the patient out of the medication? No, 3 left.   Has the patient been seen for an appointment in the last year OR does the patient have an upcoming appointment? Yes  Can we respond through MyChart? Yes  Agent: Please be advised that Rx refills may take up to 3 business days. We ask that you follow-up with your pharmacy.

## 2024-06-01 MED ORDER — OXYCODONE-ACETAMINOPHEN 10-325 MG PO TABS
1.0000 | ORAL_TABLET | ORAL | 0 refills | Status: DC | PRN
Start: 2024-06-01 — End: 2024-07-07

## 2024-06-01 NOTE — Telephone Encounter (Signed)
 Requested medication (s) are due for refill today: Yes  Requested medication (s) are on the active medication list: Yes  Last refill:  05/05/24  Future visit scheduled: No  Notes to clinic:  Not delegated    Requested Prescriptions  Pending Prescriptions Disp Refills   oxyCODONE -acetaminophen  (PERCOCET) 10-325 MG tablet 30 tablet 0    Sig: Take 1 tablet by mouth every 4 (four) hours as needed for pain.     Not Delegated - Analgesics:  Opioid Agonist Combinations Failed - 06/01/2024  2:57 PM      Failed - This refill cannot be delegated      Failed - Urine Drug Screen completed in last 360 days      Failed - Valid encounter within last 3 months    Recent Outpatient Visits           3 months ago Grief   [place] [facility] [place] Butler DASEN, MD   6 months ago Primary hypertension   [place] [facility] Du[facility] Butler DASEN, MD   10 months ago Benign essential HTN   [place] [facility] Pickard, Butler DASEN, MD   1 year ago Primary hypertension   [place] [facility] Du[facility] Butler DASEN, MD   1 year ago Benign essential HTN   [place] [facility] Pickard, Butler DASEN, MD

## 2024-06-14 ENCOUNTER — Telehealth: Payer: Self-pay | Admitting: Family Medicine

## 2024-06-14 NOTE — Telephone Encounter (Signed)
 Prescription Request  06/14/2024  LOV: 02/12/2024  What is the name of the medication or equipment?   clonazePAM  (KLONOPIN ) 0.5 MG tablet  **New script requested**  Have you contacted your pharmacy to request a refill? Yes   Which pharmacy would you like this sent to?  [id number] [address], [place] - [address] [place] [address] Phone: [phone] Fax: [phone]    Patient notified that their request is being sent to the clinical staff for review and that they should receive a response within 2 business days.   Please advise pharmacist.

## 2024-06-15 ENCOUNTER — Other Ambulatory Visit: Payer: Self-pay | Admitting: Family Medicine

## 2024-06-15 MED ORDER — CLONAZEPAM 0.5 MG PO TABS: 0.5000 mg | ORAL_TABLET | Freq: Three times a day (TID) | ORAL | 1 refills | Status: DC | PRN

## 2024-06-17 DIAGNOSIS — Z23 Encounter for immunization: Secondary | ICD-10-CM | POA: Diagnosis not present

## 2024-07-07 ENCOUNTER — Other Ambulatory Visit: Payer: Self-pay | Admitting: Family Medicine

## 2024-07-07 NOTE — Telephone Encounter (Signed)
 Copied from CRM [id number]. Topic: Clinical - Medication Refill >> Jul 07, 2024  9:42 AM Rosaria E wrote: Medication:  oxyCODONE -acetaminophen  (PERCOCET) 10-325 MG tablet    Has the patient contacted their pharmacy? Yes (Agent: If no, request that the patient contact the pharmacy for the refill. If patient does not wish to contact the pharmacy document the reason why and proceed with request.) (Agent: If yes, when and what did the pharmacy advise?)  This is the patient's preferred pharmacy:  [id number] [place], [place] - [address] [place] [address] Phone: [phone] Fax: [phone]  Is this the correct pharmacy for this prescription? Yes If no, delete pharmacy and type the correct one.   Has the prescription been filled recently? Yes  Is the patient out of the medication? Has 2 left.   Has the patient been seen for an appointment in the last year OR does the patient have an upcoming appointment? Yes  Can we respond through MyChart? Yes  Agent: Please be advised that Rx refills may take up to 3 business days. We ask that you follow-up with your pharmacy.

## 2024-07-07 NOTE — Telephone Encounter (Signed)
 Requested medication (s) are due for refill today: yes  Requested medication (s) are on the active medication list: yes  Last refill:  06/01/24  Future visit scheduled:no  Notes to clinic:  Unable to refill per protocol, cannot delegate.      Requested Prescriptions  Pending Prescriptions Disp Refills   oxyCODONE -acetaminophen  (PERCOCET) 10-325 MG tablet 30 tablet 0    Sig: Take 1 tablet by mouth every 4 (four) hours as needed for pain.     Not Delegated - Analgesics:  Opioid Agonist Combinations Failed - 07/07/2024  4:25 PM      Failed - This refill cannot be delegated      Failed - Urine Drug Screen completed in last 360 days      Failed - Valid encounter within last 3 months    Recent Outpatient Visits           4 months ago Grief   [place] [facility] Duanne, Butler DASEN, MD   7 months ago Primary hypertension   [place] [facility] Du[place] Butler DASEN, MD   11 months ago Benign essential HTN   [place] [facility] Pickard, Butler DASEN, MD   1 year ago Primary hypertension   [place] [facility] Duanne Butler DASEN, MD   1 year ago Benign essential HTN   [place] [facility] Pickard, Butler DASEN, MD

## 2024-07-08 MED ORDER — OXYCODONE-ACETAMINOPHEN 10-325 MG PO TABS: 1.0000 | ORAL_TABLET | ORAL | 0 refills | Status: DC | PRN

## 2024-07-16 DIAGNOSIS — H353211 Exudative age-related macular degeneration, right eye, with active choroidal neovascularization: Secondary | ICD-10-CM | POA: Diagnosis not present

## 2024-07-16 DIAGNOSIS — H40013 Open angle with borderline findings, low risk, bilateral: Secondary | ICD-10-CM | POA: Diagnosis not present

## 2024-07-16 DIAGNOSIS — H353121 Nonexudative age-related macular degeneration, left eye, early dry stage: Secondary | ICD-10-CM | POA: Diagnosis not present

## 2024-07-16 DIAGNOSIS — H40032 Anatomical narrow angle, left eye: Secondary | ICD-10-CM | POA: Diagnosis not present

## 2024-07-21 NOTE — Progress Notes (Signed)
 [facility] - Clinic Note  07/23/2024   CHIEF COMPLAINT Patient presents for Retina Follow Up  HISTORY OF PRESENT ILLNESS: Faith Reeves is a 73 y.o. female who presents to the clinic today for:   HPI     Retina Follow Up   Patient presents with  Wet AMD.  In right eye.  Severity is moderate.  Duration of 12 weeks.  Since onset it is stable.  I, the attending physician,  performed the HPI with the patient and updated documentation appropriately.        Comments   12 week Retina eval. Patient states no changes noticed. Has new glasses on order      Last edited by Valdemar Rogue, MD on 07/23/2024  1:44 PM.     Pt states she's doing ok. Expecting a new great grand baby.   Referring physician: Duanne Butler DASEN, MD [address] [facility] [address] [place],  [place] [address]  HISTORICAL INFORMATION:   Selected notes from the MEDICAL RECORD NUMBER Referred by Dr. Medford Ferrier for concern of exu ARMD OU   CURRENT MEDICATIONS: No current outpatient medications on file. (Ophthalmic Drugs)   No current facility-administered medications for this visit. (Ophthalmic Drugs)   Current Outpatient Medications (Other)  Medication Sig   albuterol  (PROVENTIL  HFA;VENTOLIN  HFA) 108 (90 Base) MCG/ACT inhaler Inhale 2 puffs into the lungs every 4 (four) hours as needed for wheezing or shortness of breath.   amoxicillin  (AMOXIL ) 875 MG tablet Take 875 mg by mouth 2 (two) times daily.   celecoxib  (CELEBREX ) 200 MG capsule TAKE 1 CAPSULE BY MOUTH EVERY DAY   clonazePAM  (KLONOPIN ) 0.5 MG tablet Take 1 tablet (0.5 mg total) by mouth 3 (three) times daily as needed for anxiety.   docusate sodium  (COLACE) 100 MG capsule Take 1 capsule (100 mg total) by mouth 2 (two) times daily.   gabapentin  (NEURONTIN ) 300 MG capsule Take 1 capsule by mouth three times a day.   ipratropium (ATROVENT ) 0.06 % nasal spray SPRAY 2 SPRAYS INTO EACH NOSTRIL 4 TIMES A DAY   levocetirizine (XYZAL ) 5 MG tablet TAKE 1  TABLET BY MOUTH EVERY DAY IN THE EVENING   mometasone  (ELOCON ) 0.1 % cream APPLY TO AFFECTED AREA TOPICALLY EVERY DAY   Multiple Vitamins-Minerals (EQ VISION FORMULA 50+ PO) Take 1 capsule by mouth daily.   oxyCODONE -acetaminophen  (PERCOCET) 10-325 MG tablet Take 1 tablet by mouth every 4 (four) hours as needed for pain.   pantoprazole  (PROTONIX ) 40 MG tablet TAKE 1 TABLET (40 MG TOTAL) BY MOUTH DAILY. COURTESY REFILL. ANNUAL NEEDED FOR FUTURE REFILLS.   Semaglutide -Weight Management (WEGOVY ) 0.5 MG/0.5ML SOAJ Inject 0.5 mg into the skin once a week.   sertraline  (ZOLOFT ) 50 MG tablet TAKE 1 AND 1/2 TABLETS BY MOUTH DAILY   telmisartan -hydrochlorothiazide  (MICARDIS  HCT) 80-12.5 MG tablet TAKE 1 TABLET BY MOUTH EVERY DAY   traZODone  (DESYREL ) 50 MG tablet TAKE 1 TABLET BY MOUTH EVERYDAY AT BEDTIME   valACYclovir  (VALTREX ) 1000 MG tablet Take 2 tablets (2,000 mg total) by mouth 2 (two) times daily.   zolpidem  (AMBIEN ) 10 MG tablet TAKE 1 TABLET BY MOUTH EVERY DAY AT BEDTIME AS NEEDED FOR SLEEP   No current facility-administered medications for this visit. (Other)   REVIEW OF SYSTEMS: ROS   Positive for: Gastrointestinal, Neurological, Eyes Negative for: Constitutional, Skin, Genitourinary, Musculoskeletal, HENT, Endocrine, Cardiovascular, Respiratory, Psychiatric, Allergic/Imm, Heme/Lymph Last edited by German Olam BRAVO, COT on 07/23/2024 12:54 PM.  ALLERGIES Allergies  Allergen Reactions   Cymbalta [Duloxetine Hcl] Swelling   PAST MEDICAL HISTORY Past Medical History:  Diagnosis Date   Allergy    Anemia    Anxiety    Arthritis    Blood transfusion without reported diagnosis    Cataract    GERD (gastroesophageal reflux disease)    Hypertension    Hypertensive retinopathy    OU   Macular degeneration    Past Surgical History:  Procedure Laterality Date   ABDOMINAL HYSTERECTOMY     ACHILLES TENDON REPAIR Bilateral    APPENDECTOMY     BACK SURGERY     BICEPT TENODESIS  Right 04/15/2023   Procedure: RIGHT BICEPS TENODESIS;  Surgeon: Addie Cordella Hamilton, MD;  Location: [facility] OR;  Service: Orthopedics;  Laterality: Right;   BILATERAL CARPAL TUNNEL RELEASE     CHOLECYSTECTOMY     COLONOSCOPY     EYE SURGERY Right 09/18/2022   cataract extraction   HERNIA REPAIR     JOINT REPLACEMENT     MELANOMA EXCISION Right 09/15/2017   Procedure: EXCISION LIPOSARCOMA RIGHT ARM;  Surgeon: Sebastian Moles, MD;  Location: [facility] OR;  Service: General;  Laterality: Right;   REVERSE SHOULDER ARTHROPLASTY Right 04/15/2023   Procedure: RIGHT REVERSE SHOULDER ARTHROPLASTY;  Surgeon: Addie Cordella Hamilton, MD;  Location: [facility] OR;  Service: Orthopedics;  Laterality: Right;   SPINE SURGERY     FAMILY HISTORY Family History  Problem Relation Age of Onset   Hypertension Mother    Cancer Sister    Stroke Maternal Grandmother    SOCIAL HISTORY Social History   Tobacco Use   Smoking status: Never   Smokeless tobacco: Never  Vaping Use   Vaping status: Never Used  Substance Use Topics   Alcohol use: Yes    Alcohol/week: 1.0 standard drink of alcohol    Types: 1 Glasses of wine per week   Drug use: No       OPHTHALMIC EXAM: Base Eye Exam     Visual Acuity (Snellen - Linear)       Right Left   Dist cc 20/40 20/20   Dist ph cc 20/30 -2     Correction: Glasses         Tonometry (Tonopen, 12:56 PM)       Right Left   Pressure 12 16         Pupils       Dark Light Shape React APD   Right 3 2 Round Brisk None   Left 3 2 Round Brisk None         Visual Fields (Counting fingers)       Left Right    Full Full         Extraocular Movement       Right Left    Full, Ortho Full, Ortho         Neuro/Psych     Oriented x3: Yes   Mood/Affect: Normal         Dilation     Both eyes: 1.0% Mydriacyl, 2.5% Phenylephrine  @ 12:56 PM           Slit Lamp and Fundus Exam     Slit Lamp Exam       Right Left   Lids/Lashes Dermatochalasis - upper lid,  mild Meibomian gland dysfunction Dermatochalasis - upper lid, mild Meibomian gland dysfunction   Conjunctiva/Sclera White and quiet White and quiet   Cornea 2-3+ fine Punctate epithelial erosions 2+ fine Punctate  epithelial erosions inferiorly   Anterior Chamber deep, clear, narrow temporal angle deep, clear, narrow temporal angle   Iris Round and dilated Round and dilated   Lens PCIOL in good position 2-3+ Nuclear sclerosis, 2-3+ Cortical cataract   Anterior Vitreous Vitreous syneresis, Posterior vitreous detachment, vitreous condensations Vitreous syneresis, Posterior vitreous detachment, mild vitreous condensations         Fundus Exam       Right Left   Disc Pink and Sharp, Compact, +PPP Pink and Sharp, temporal PPP, Compact   C/D Ratio 0.2 0.1   Macula Blunted foveal reflex, drusen, RPE mottling, clumping and atrophy, +CNV, Stable resolution of central SRF, No heme, persistent low PED Flat, good foveal reflex, scattered Drusen, RPE mottling and clumping, focal PEDs, No heme or edema   Vessels attenuated, Tortuous attenuated, Tortuous   Periphery Attached, mild reticular degeneration, no heme Attached, no heme           Refraction     Wearing Rx       Sphere Cylinder Axis Add   Right +2.00 +0.50 008 +2.50   Left +2.50 +1.00 154 +2.50    Type: PAL           IMAGING AND PROCEDURES  Imaging and Procedures for @TODAY @  OCT, Retina - OU - Both Eyes       Right Eye Quality was good. Central Foveal Thickness: 225. Progression has been stable. Findings include normal foveal contour, no IRF, no SRF, retinal drusen , pigment epithelial detachment, outer retinal atrophy (Stable improvement in central SRF overlying low, stable PEDs, central ORA).   Left Eye Quality was good. Central Foveal Thickness: 265. Progression has been stable. Findings include normal foveal contour, no IRF, no SRF, retinal drusen .   Notes *Images captured and stored on drive  Diagnosis /  Impression:  OD: stable improvement in central SRF overlying low, stable PEDs, central ORA OS: non-exu ARMD -- stable  Clinical management:  See below  Abbreviations: NFP - Normal foveal profile. CME - cystoid macular edema. PED - pigment epithelial detachment. IRF - intraretinal fluid. SRF - subretinal fluid. EZ - ellipsoid zone. ERM - epiretinal membrane. ORA - outer retinal atrophy. ORT - outer retinal tubulation. SRHM - subretinal hyper-reflective material     Intravitreal Injection, Pharmacologic Agent - OD - Right Eye       Time Out 07/23/2024. 1:21 PM. Confirmed correct patient, procedure, site, and patient consented.   Anesthesia Topical anesthesia was used. Anesthetic medications included Lidocaine  2%, Proparacaine 0.5%.   Procedure Preparation included 5% betadine  to ocular surface, eyelid speculum. A (32g) needle was used.   Injection: 6 mg faricimab -svoa 6 MG/0.05ML   Route: Intravitreal, Site: Right Eye   NDC: 49757-903-93, Lot: [id number], Expiration date: 08/03/2025, Waste: 0 mL   Post-op Post injection exam found visual acuity of at least counting fingers. The patient tolerated the procedure well. There were no complications. The patient received written and verbal post procedure care education. Post injection medications were not given.              ASSESSMENT/PLAN:    ICD-10-CM   1. Exudative age-related macular degeneration of right eye with active choroidal neovascularization (HCC)  H35.3211 OCT, Retina - OU - Both Eyes    Intravitreal Injection, Pharmacologic Agent - OD - Right Eye    faricimab -svoa (VABYSMO ) 6mg /0.12mL intravitreal injection    2. Intermediate stage nonexudative age-related macular degeneration of left eye  H35.3122     3.  Essential hypertension  I10     4. Hypertensive retinopathy of both eyes  H35.033     5. Combined forms of age-related cataract of left eye  H25.812     6. Pseudophakia  Z96.1     7. Glaucoma suspect of both  eyes  H40.003     8. Dry eyes  H04.123        1. Exudative age related macular degeneration, right eye  - FA (03.10.20) shows +CNV w/ staining/leakage  - repeat FA 09.13.21 shows interval increase in perifoveal leakage OD - s/p IVA OD #1 (03.10.20), #2 (04.13.20), #3 (05.11.20), #4 (06.23.20), #5 (8.18.20), #6 (10.27.20), #7 (01.26.21), #8 (04.26.21), #9 (08.02.21), #10 (09.13.21), #11 (10.11.21), #12 (11.9.21), #13 (12.14.21), #14 (1.24.22), #15 (3.15.22), #16 (05.24.22), #17 (8.9.22), #18 (10.18.22), #19 (01.03.23), #20 (03.29.23) -- IVA resistance ============================= - s/p IVE OD #1 (06.07.23), #2 (07.19.23), #3 (09.01.23), #4 (10.13.23), #5 (11.10.23), #6 (12.22.23) -- IVE resistance ==============================  - s/p IVV OD #1 (02.02.24), #2 (03.15.24), #3 (05.01.24), #4 (06.05.24), #5 (07.17.24), #6 (09.11.24), #7 (10.06.24), #8 (01.08.25), #8 (03.19.25) #9(06.20.25) - pt was doing very well with q3 mo maintenance injections, but then developed interval increases in central SRF over several visits -- IVA and IVE resistance  - BCVA OD 20/30 -- stable - OCT today stable improvement in central SRF overlying low, stable PEDs, central ORA at 13 weeks   - recommend IVV OD #10 today, 09.19.25 w/ f/u in 13 wks  - pt wishes to be treated with IVV OD  - RBA of procedure discussed, questions answered  - IVV informed consent obtained and signed, 02.02.24 (OD)  - see procedure note  - Eylea  is being paid for through Medicare and [employer]   - f/u in 13 wks -- DFE/OCT/possible injection, tx and ext as able  2. Age related macular degeneration, non-exudative, left eye - The incidence, anatomy, and pathology of dry AMD, risk of progression, and the AREDS and AREDS 2 study including smoking risks discussed with patient.  - intermediate stage  - recommend amsler grid monitoring  3,4. Hypertensive retinopathy OU  - discussed importance of tight BP control  - monitor  5. Mixed form age  related cataracts OS - The symptoms of cataract, surgical options, and treatments and risks were discussed with patient  - under the expert management of Dr. Fleeta  6. Pseudophakia OD  - s/p CE/IOL (Dr. Fleeta, 11.15.23)  - IOL in good position, doing well  - monitor  7. Glaucoma Suspect OU  - IOP 15,19  - under the expert management of Dr. Fleeta  8. Dry eyes OU  - decreased vision OD today mostly due to increased PEE / dry eyes  - pt reports stopping ATs after hearing about product recalls on ATs - recommend artificial tears and lubricating ointment as needed   Return in about 13 weeks (around 10/22/2024) for exu ARMD OS, DFE, OCT, Possible Injxn. '  Ophthalmic Meds Ordered this visit:  Meds ordered this encounter  Medications   faricimab -svoa (VABYSMO ) 6mg /0.103mL intravitreal injection     This document serves as a record of services personally performed by Redell JUDITHANN Hans, MD, PhD. It was created on their behalf by Delon Newness COT, an ophthalmic technician. The creation of this record is the provider's dictation and/or activities during the visit.    Electronically signed by: Delon Newness COT 09.17.2025  4:29 PM  This document serves as a record of services personally performed by Redell JUDITHANN Hans, MD,  PhD. It was created on their behalf by Almetta Pesa, an ophthalmic technician. The creation of this record is the provider's dictation and/or activities during the visit.    Electronically signed by: Almetta Pesa, OA, 07/31/24  4:29 PM  Redell JUDITHANN Hans, M.D., Ph.D. Diseases & Surgery of the Retina and Vitreous [facility] 07/23/2024   I have reviewed the above documentation for accuracy and completeness, and I agree with the above. Redell JUDITHANN Hans, M.D., Ph.D. 07/31/24 4:30 PM   Abbreviations: M myopia (nearsighted); A astigmatism; H hyperopia (farsighted); P presbyopia; Mrx spectacle prescription;  CTL contact lenses; OD right eye; OS  left eye; OU both eyes  XT exotropia; ET esotropia; PEK punctate epithelial keratitis; PEE punctate epithelial erosions; DES dry eye syndrome; MGD meibomian gland dysfunction; ATs artificial tears; PFAT's preservative free artificial tears; NSC nuclear sclerotic cataract; PSC posterior subcapsular cataract; ERM epi-retinal membrane; PVD posterior vitreous detachment; RD retinal detachment; DM diabetes mellitus; DR diabetic retinopathy; NPDR non-proliferative diabetic retinopathy; PDR proliferative diabetic retinopathy; CSME clinically significant macular edema; DME diabetic macular edema; dbh dot blot hemorrhages; CWS cotton wool spot; POAG primary open angle glaucoma; C/D cup-to-disc ratio; HVF humphrey visual field; GVF goldmann visual field; OCT optical coherence tomography; IOP intraocular pressure; BRVO Branch retinal vein occlusion; CRVO central retinal vein occlusion; CRAO central retinal artery occlusion; BRAO branch retinal artery occlusion; RT retinal tear; SB scleral buckle; PPV pars plana vitrectomy; VH Vitreous hemorrhage; PRP panretinal laser photocoagulation; IVK intravitreal kenalog ; VMT vitreomacular traction; MH Macular hole;  NVD neovascularization of the disc; NVE neovascularization elsewhere; AREDS age related eye disease study; ARMD age related macular degeneration; POAG primary open angle glaucoma; EBMD epithelial/anterior basement membrane dystrophy; ACIOL anterior chamber intraocular lens; IOL intraocular lens; PCIOL posterior chamber intraocular lens; Phaco/IOL phacoemulsification with intraocular lens placement; PRK photorefractive keratectomy; LASIK laser assisted in situ keratomileusis; HTN hypertension; DM diabetes mellitus; COPD chronic obstructive pulmonary disease

## 2024-07-23 ENCOUNTER — Encounter (INDEPENDENT_AMBULATORY_CARE_PROVIDER_SITE_OTHER): Payer: Self-pay | Admitting: Ophthalmology

## 2024-07-23 ENCOUNTER — Ambulatory Visit (INDEPENDENT_AMBULATORY_CARE_PROVIDER_SITE_OTHER): Admitting: Ophthalmology

## 2024-07-23 DIAGNOSIS — H40003 Preglaucoma, unspecified, bilateral: Secondary | ICD-10-CM | POA: Diagnosis not present

## 2024-07-23 DIAGNOSIS — Z961 Presence of intraocular lens: Secondary | ICD-10-CM | POA: Diagnosis not present

## 2024-07-23 DIAGNOSIS — H353122 Nonexudative age-related macular degeneration, left eye, intermediate dry stage: Secondary | ICD-10-CM | POA: Diagnosis not present

## 2024-07-23 DIAGNOSIS — H25812 Combined forms of age-related cataract, left eye: Secondary | ICD-10-CM | POA: Diagnosis not present

## 2024-07-23 DIAGNOSIS — H353211 Exudative age-related macular degeneration, right eye, with active choroidal neovascularization: Secondary | ICD-10-CM

## 2024-07-23 DIAGNOSIS — I10 Essential (primary) hypertension: Secondary | ICD-10-CM

## 2024-07-23 DIAGNOSIS — H04123 Dry eye syndrome of bilateral lacrimal glands: Secondary | ICD-10-CM

## 2024-07-23 DIAGNOSIS — H35033 Hypertensive retinopathy, bilateral: Secondary | ICD-10-CM | POA: Diagnosis not present

## 2024-07-23 MED ORDER — FARICIMAB-SVOA 6 MG/0.05ML IZ SOSY
6.0000 mg | PREFILLED_SYRINGE | INTRAVITREAL | Status: AC | PRN
  Administered 2024-07-23: 6 mg via INTRAVITREAL

## 2024-08-06 ENCOUNTER — Telehealth: Payer: Self-pay

## 2024-08-06 ENCOUNTER — Other Ambulatory Visit: Payer: Self-pay | Admitting: Family Medicine

## 2024-08-06 MED ORDER — OXYCODONE-ACETAMINOPHEN 10-325 MG PO TABS: 1.0000 | ORAL_TABLET | ORAL | 0 refills | Status: DC | PRN

## 2024-08-06 NOTE — Telephone Encounter (Signed)
 Copied from CRM #[id number]. Topic: Clinical - Medication Refill >> Aug 06, 2024  8:34 AM Tiffini S wrote: Medication: oxyCODONE -acetaminophen  (PERCOCET) 10-325 MG tablet    Has the patient contacted their pharmacy? No (Agent: If no, request that the patient contact the pharmacy for the refill. If patient does not wish to contact the pharmacy document the reason why and proceed with request.) (Agent: If yes, when and what did the pharmacy advise?)  This is the patient's preferred pharmacy:  [id number] [place], [place] - [address] [place] [address] Phone: [phone] Fax: [phone]  Is this the correct pharmacy for this prescription? Yes If no, delete pharmacy and type the correct one.   Has the prescription been filled recently? Yes  Is the patient out of the medication? Yes, have two tablets left   Has the patient been seen for an appointment in the last year OR does the patient have an upcoming appointment? Yes  Can we respond through MyChart? No, please call at [phone]  Agent: Please be advised that Rx refills may take up to 3 business days. We ask that you follow-up with your pharmacy.

## 2024-08-06 NOTE — Telephone Encounter (Signed)
 Requested medications are due for refill today.  yes  Requested medications are on the active medications list.  yes  Last refill. 07/08/2024 #30 0 rf  Future visit scheduled.   no  Notes to clinic.  Refill not delegated    Requested Prescriptions  Pending Prescriptions Disp Refills   oxyCODONE -acetaminophen  (PERCOCET) 10-325 MG tablet 30 tablet 0    Sig: Take 1 tablet by mouth every 4 (four) hours as needed for pain.     Not Delegated - Analgesics:  Opioid Agonist Combinations Failed - 08/06/2024  4:35 PM      Failed - This refill cannot be delegated      Failed - Urine Drug Screen completed in last 360 days      Failed - Valid encounter within last 3 months    Recent Outpatient Visits           5 months ago Grief   [place] [facility] Duanne, Butler DASEN, MD   8 months ago Primary hypertension   [place] [facility] Duanne, Butler DASEN, MD   1 year ago Benign essential HTN   [place] [facility] Duanne Butler DASEN, MD   1 year ago Primary hypertension   [place] [facility] Duanne Butler DASEN, MD   1 year ago Benign essential HTN   [place] [facility] Pickard, Butler DASEN, MD

## 2024-08-06 NOTE — Telephone Encounter (Signed)
 Copied from CRM #[id number]. Topic: Clinical - Medication Refill >> Aug 06, 2024  8:34 AM Tiffini S wrote: Medication: oxyCODONE -acetaminophen  (PERCOCET) 10-325 MG tablet    Has the patient contacted their pharmacy? No (Agent: If no, request that the patient contact the pharmacy for the refill. If patient does not wish to contact the pharmacy document the reason why and proceed with request.) (Agent: If yes, when and what did the pharmacy advise?)  This is the patient's preferred pharmacy:  [id number] [place], [place] - [address] [place] [address] Phone: [phone] Fax: [phone]  Is this the correct pharmacy for this prescription? Yes If no, delete pharmacy and type the correct one.   Has the prescription been filled recently? Yes  Is the patient out of the medication? Yes, have two tablets left   Has the patient been seen for an appointment in the last year OR does the patient have an upcoming appointment? Yes  Can we respond through MyChart? No, please call at [phone]  Agent: Please be advised that Rx refills may take up to 3 business days. We ask that you follow-up with your pharmacy. >> Aug 06, 2024  8:40 AM Tiffini S wrote: Instruction states can take up to 4 tablets daily- but is only prescribed 30 tablets- please refill as 90 tablet for oxyCODONE -acetaminophen  (PERCOCET) 10-325 MG tablet

## 2024-08-13 MED ORDER — OXYCODONE-ACETAMINOPHEN 10-325 MG PO TABS: 1.0000 | ORAL_TABLET | ORAL | 0 refills | Status: DC | PRN

## 2024-08-18 ENCOUNTER — Other Ambulatory Visit: Payer: Self-pay | Admitting: Family Medicine

## 2024-08-18 DIAGNOSIS — L299 Pruritus, unspecified: Secondary | ICD-10-CM

## 2024-09-20 ENCOUNTER — Other Ambulatory Visit: Payer: Self-pay | Admitting: Family Medicine

## 2024-09-20 DIAGNOSIS — T7840XD Allergy, unspecified, subsequent encounter: Secondary | ICD-10-CM

## 2024-09-21 NOTE — Telephone Encounter (Signed)
 Requested medications are due for refill today.  Clonazepam  and Atrovent  are. Azithromycin  is d/c'd  Requested medications are on the active medications list.  yes  Last refill. Clonazepam  06/15/2024 #60 1rf, Atrovent  02/13/2024 15mL 1 rf,   Future visit scheduled.   yes  Notes to clinic.  Please review for refill.    Requested Prescriptions  Pending Prescriptions Disp Refills   clonazePAM  (KLONOPIN ) 0.5 MG tablet [Pharmacy Med Name: CLONAZEPAM  0.5 MG TABLET] 60 tablet 1    Sig: Take 1 tablet (0.5 mg total) by mouth 3 (three) times daily as needed for anxiety.     Not Delegated - Psychiatry: Anxiolytics/Hypnotics 2 Failed - 09/21/2024  5:18 PM      Failed - This refill cannot be delegated      Failed - Urine Drug Screen completed in last 360 days      Failed - Valid encounter within last 6 months    Recent Outpatient Visits           7 months ago Grief   [place] [facility] Pickard, Butler DASEN, MD   10 months ago Primary hypertension   [place] [facility] Duanne, Butler DASEN, MD   1 year ago Benign essential HTN   [place] [facility] Pickard, Butler DASEN, MD   1 year ago Primary hypertension   [place] [facility] Duanne, Butler DASEN, MD   1 year ago Benign essential HTN   [place] [facility] Pickard, Butler DASEN, MD              Passed - Patient is not pregnant       ipratropium (ATROVENT ) 0.06 % nasal spray [Pharmacy Med Name: IPRATROPIUM 0.06% SPRAY]  1    Sig: SPRAY 2 SPRAYS INTO EACH NOSTRIL 4 TIMES A DAY     Off-Protocol Failed - 09/21/2024  5:18 PM      Failed - Medication not assigned to a protocol, review manually.      Passed - Valid encounter within last 12 months    Recent Outpatient Visits           7 months ago Grief   [place] [facility] Pickard, Butler DASEN, MD   10 months ago Primary hypertension   [place] [facility] Family  Medicine Duanne, Butler DASEN, MD   1 year ago Benign essential HTN   [place] [facility] Duanne, Butler DASEN, MD   1 year ago Primary hypertension   [place] [facility] Duanne, Butler DASEN, MD   1 year ago Benign essential HTN   [place] [facility] Duanne Butler DASEN, MD             Off-Protocol Failed - 09/21/2024  5:18 PM      Failed - Medication not assigned to a protocol, review manually.      Passed - Valid encounter within last 12 months    Recent Outpatient Visits           7 months ago Grief   [place] [facility] Pickard, Butler DASEN, MD   10 months ago Primary hypertension   [place] [facility] Pickard, Butler DASEN, MD   1 year ago Benign essential HTN   [place] [facility] Pickard, Butler DASEN, MD   1 year ago Primary hypertension     [facility] Pickard, Butler DASEN, MD   1 year ago Benign essential HTN   [place] [facility] Pickard, Butler DASEN, MD               azithromycin  (ZITHROMAX ) 250 MG tablet [Pharmacy Med Name: AZITHROMYCIN  250 MG TABLET] 6 tablet 0    Sig: TAKE 2 TABLETS BY MOUTH TODAY, THEN TAKE 1 TABLET DAILY FOR 4 DAYS AS DIRECTED     Off-Protocol Failed - 09/21/2024  5:18 PM      Failed - Medication not assigned to a protocol, review manually.      Passed - Valid encounter within last 12 months    Recent Outpatient Visits           7 months ago Grief   [place] [facility] Pickard, Butler DASEN, MD   10 months ago Primary hypertension   [place] [facility] Duanne, Butler DASEN, MD   1 year ago Benign essential HTN   [place] [facility] Duanne, Butler DASEN, MD   1 year ago Primary hypertension   [place] [facility] Duanne, Butler DASEN, MD   1 year ago Benign essential HTN    [facility]  Pickard, Butler DASEN, MD

## 2024-09-24 ENCOUNTER — Other Ambulatory Visit: Payer: Self-pay

## 2024-09-24 DIAGNOSIS — T7840XD Allergy, unspecified, subsequent encounter: Secondary | ICD-10-CM

## 2024-09-24 MED ORDER — CLONAZEPAM 0.5 MG PO TABS
0.5000 mg | ORAL_TABLET | Freq: Three times a day (TID) | ORAL | 1 refills | Status: AC | PRN
Start: 2024-09-24 — End: ?

## 2024-09-24 MED ORDER — IPRATROPIUM BROMIDE 0.06 % NA SOLN: NASAL | 1 refills | Status: DC

## 2024-10-05 ENCOUNTER — Other Ambulatory Visit: Payer: Self-pay | Admitting: Family Medicine

## 2024-10-05 NOTE — Telephone Encounter (Unsigned)
 Copied from CRM #[id number]. Topic: Clinical - Medication Refill >> Oct 05, 2024  8:19 AM Amy B wrote: Medication: oxyCODONE -acetaminophen  (PERCOCET) 10-325 MG tablet celecoxib  (CELEBREX ) 200 MG capsule  Has the patient contacted their pharmacy? No (Agent: If no, request that the patient contact the pharmacy for the refill. If patient does not wish to contact the pharmacy document the reason why and proceed with request.) (Agent: If yes, when and what did the pharmacy advise?)  This is the patient's preferred pharmacy:  [id number] [place], [place] - [address] [place] [address] Phone: [phone] Fax: [phone]  Is this the correct pharmacy for this prescription? Yes If no, delete pharmacy and type the correct one.   Has the prescription been filled recently? No  Is the patient out of the medication? Yes  Has the patient been seen for an appointment in the last year OR does the patient have an upcoming appointment? Yes  Can we respond through MyChart? No  Agent: Please be advised that Rx refills may take up to 3 business days. We ask that you follow-up with your pharmacy.

## 2024-10-07 NOTE — Telephone Encounter (Signed)
 Requested medication (s) are due for refill today - provider review   Requested medication (s) are on the active medication list -yes  Future visit scheduled -yes  Last refill: oxycodone - 08/13/24 #30- non delegated Rx                  Celecoxib - 10/21/2019 #90 3RF- expired Rx  Notes to clinic: see above  Requested Prescriptions  Pending Prescriptions Disp Refills   oxyCODONE -acetaminophen  (PERCOCET) 10-325 MG tablet 30 tablet 0    Sig: Take 1 tablet by mouth every 4 (four) hours as needed for pain.     Not Delegated - Analgesics:  Opioid Agonist Combinations Failed - 10/07/2024  4:29 PM      Failed - This refill cannot be delegated      Failed - Urine Drug Screen completed in last 360 days      Failed - Valid encounter within last 3 months    Recent Outpatient Visits           7 months ago Grief   [facility] Pickard, Butler DASEN, MD   10 months ago Primary hypertension   [facility] Duanne, Butler DASEN, MD   1 year ago Benign essential HTN   [place] [facility] Duanne Butler DASEN, MD   1 year ago Primary hypertension   [place] [facility] Duanne Butler DASEN, MD   1 year ago Benign essential HTN    [facility] Pickard, Butler DASEN, MD               celecoxib  (CELEBREX ) 200 MG capsule      Sig: Take by mouth daily.     Analgesics:  COX2 Inhibitors Failed - 10/07/2024  4:29 PM      Failed - Manual Review: Labs are only required if the patient has taken medication for more than 8 weeks.      Failed - Cr in normal range and within 360 days    Creat  Date Value Ref Range Status  11/24/2023 1.18 (H) 0.60 - 1.00 mg/dL Final         Failed - HCT in normal range and within 360 days    HCT  Date Value Ref Range Status  11/24/2023 45.3 (H) 35.0 - 45.0 % Final         Passed - HGB in normal range and within 360 days    Hemoglobin  Date Value Ref  Range Status  11/24/2023 14.6 11.7 - 15.5 g/dL Final         Passed - AST in normal range and within 360 days    AST  Date Value Ref Range Status  11/24/2023 13 10 - 35 U/L Final         Passed - ALT in normal range and within 360 days    ALT  Date Value Ref Range Status  11/24/2023 9 6 - 29 U/L Final         Passed - eGFR is 30 or above and within 360 days    GFR, Est African American  Date Value Ref Range Status  03/12/2021 55 (L) > OR = 60 mL/min/1.41m2 Final   GFR, Est Non African American  Date Value Ref Range Status  03/12/2021 47 (L) > OR = 60 mL/min/1.78m2 Final   GFR, Estimated  Date Value Ref Range Status  04/08/2023 51 (L) >60 mL/min Final  Comment:    (NOTE) Calculated using the CKD-EPI Creatinine Equation (2021)    eGFR  Date Value Ref Range Status  11/24/2023 48 (L) > OR = 60 mL/min/1.49m2 Final         Passed - Patient is not pregnant      Passed - Valid encounter within last 12 months    Recent Outpatient Visits           7 months ago Grief   [place] [facility] Pickard, Butler DASEN, MD   10 months ago Primary hypertension   [place] [facility] Pickard, Butler DASEN, MD   1 year ago Benign essential HTN   [place] [facility] Pickard, Butler DASEN, MD   1 year ago Primary hypertension   [place] [facility] Duanne, Butler DASEN, MD   1 year ago Benign essential HTN   [place] [facility] Duanne Butler DASEN, MD                 Requested Prescriptions  Pending Prescriptions Disp Refills   oxyCODONE -acetaminophen  (PERCOCET) 10-325 MG tablet 30 tablet 0    Sig: Take 1 tablet by mouth every 4 (four) hours as needed for pain.     Not Delegated - Analgesics:  Opioid Agonist Combinations Failed - 10/07/2024  4:29 PM      Failed - This refill cannot be delegated      Failed - Urine Drug Screen completed in last 360 days      Failed - Valid encounter  within last 3 months    Recent Outpatient Visits           7 months ago Grief   [place] [facility] Pickard, Butler DASEN, MD   10 months ago Primary hypertension   [place] [facility] Duanne, Butler DASEN, MD   1 year ago Benign essential HTN   [place] [facility] Duanne Butler DASEN, MD   1 year ago Primary hypertension   [place] [facility] Duanne Butler DASEN, MD   1 year ago Benign essential HTN   [place] [facility] Pickard, Butler DASEN, MD               celecoxib  (CELEBREX ) 200 MG capsule      Sig: Take by mouth daily.     Analgesics:  COX2 Inhibitors Failed - 10/07/2024  4:29 PM      Failed - Manual Review: Labs are only required if the patient has taken medication for more than 8 weeks.      Failed - Cr in normal range and within 360 days    Creat  Date Value Ref Range Status  11/24/2023 1.18 (H) 0.60 - 1.00 mg/dL Final         Failed - HCT in normal range and within 360 days    HCT  Date Value Ref Range Status  11/24/2023 45.3 (H) 35.0 - 45.0 % Final         Passed - HGB in normal range and within 360 days    Hemoglobin  Date Value Ref Range Status  11/24/2023 14.6 11.7 - 15.5 g/dL Final         Passed - AST in normal range and within 360 days    AST  Date Value Ref Range Status  11/24/2023 13 10 - 35 U/L Final  Passed - ALT in normal range and within 360 days    ALT  Date Value Ref Range Status  11/24/2023 9 6 - 29 U/L Final         Passed - eGFR is 30 or above and within 360 days    GFR, Est African American  Date Value Ref Range Status  03/12/2021 55 (L) > OR = 60 mL/min/1.36m2 Final   GFR, Est Non African American  Date Value Ref Range Status  03/12/2021 47 (L) > OR = 60 mL/min/1.55m2 Final   GFR, Estimated  Date Value Ref Range Status  04/08/2023 51 (L) >60 mL/min Final    Comment:    (NOTE) Calculated using the CKD-EPI Creatinine  Equation (2021)    eGFR  Date Value Ref Range Status  11/24/2023 48 (L) > OR = 60 mL/min/1.50m2 Final         Passed - Patient is not pregnant      Passed - Valid encounter within last 12 months    Recent Outpatient Visits           7 months ago Grief   [place] [facility] Pickard, Butler DASEN, MD   10 months ago Primary hypertension   [place] [facility] Duanne, Butler DASEN, MD   1 year ago Benign essential HTN   [place] [facility] Duanne Butler DASEN, MD   1 year ago Primary hypertension   [facility] Duanne Butler DASEN, MD   1 year ago Benign essential HTN   [place] [facility] Pickard, Butler DASEN, MD

## 2024-10-11 ENCOUNTER — Telehealth: Payer: Self-pay

## 2024-10-11 NOTE — Telephone Encounter (Signed)
 Copied from CRM #[id number]. Topic: Clinical - Medication Refill >> Oct 05, 2024  8:19 AM Amy B wrote: Medication: oxyCODONE -acetaminophen  (PERCOCET) 10-325 MG tablet celecoxib  (CELEBREX ) 200 MG capsule  Has the patient contacted their pharmacy? No (Agent: If no, request that the patient contact the pharmacy for the refill. If patient does not wish to contact the pharmacy document the reason why and proceed with request.) (Agent: If yes, when and what did the pharmacy advise?)  This is the patient's preferred pharmacy:  [id number] [place], [place] - [address] [place] [address] Phone: [phone] Fax: [phone]  Is this the correct pharmacy for this prescription? Yes If no, delete pharmacy and type the correct one.   Has the prescription been filled recently? No  Is the patient out of the medication? Yes  Has the patient been seen for an appointment in the last year OR does the patient have an upcoming appointment? Yes  Can we respond through MyChart? No  Agent: Please be advised that Rx refills may take up to 3 business days. We ask that you follow-up with your pharmacy. >> Oct 11, 2024  8:32 AM Macario HERO wrote: Patient called said that she's been out of her medication for 4 days and is in pain.

## 2024-10-12 ENCOUNTER — Other Ambulatory Visit: Payer: Self-pay | Admitting: Family Medicine

## 2024-10-12 ENCOUNTER — Ambulatory Visit: Admitting: Family Medicine

## 2024-10-12 MED ORDER — OXYCODONE-ACETAMINOPHEN 10-325 MG PO TABS: 1.0000 | ORAL_TABLET | ORAL | 0 refills | Status: DC | PRN

## 2024-10-14 ENCOUNTER — Other Ambulatory Visit: Payer: Self-pay

## 2024-10-14 MED ORDER — CELECOXIB 200 MG PO CAPS: 200.0000 mg | ORAL_CAPSULE | Freq: Every day | ORAL | 1 refills | Status: AC

## 2024-10-18 NOTE — Progress Notes (Signed)
 [facility] - Clinic Note  10/20/2024   CHIEF COMPLAINT Patient presents for Retina Follow Up  HISTORY OF PRESENT ILLNESS: Faith Reeves is a 73 y.o. female who presents to the clinic today for:   HPI     Retina Follow Up   Patient presents with  Wet AMD.  In right eye.  This started 5 years ago.  Severity is moderate.  Duration of 13 weeks.  Since onset it is stable.  I, the attending physician,  performed the HPI with the patient and updated documentation appropriately.        Comments   Pt denies any changes in vision. Pt states she notices more floaters about a week before she is due for an appointment. Pt denies FOL/pain.      Last edited by Elnor Avelina RAMAN, COT on 10/20/2024 12:58 PM.      Pt states she's doing well. No issues noted, not using any Ats.   Referring physician: Duanne Butler DASEN, MD [id number] [facility] [address] [place],  [place] [address]  HISTORICAL INFORMATION:   Selected notes from the MEDICAL RECORD NUMBER Referred by Dr. Medford Ferrier for concern of exu ARMD OU   CURRENT MEDICATIONS: No current outpatient medications on file. (Ophthalmic Drugs)   No current facility-administered medications for this visit. (Ophthalmic Drugs)   Current Outpatient Medications (Other)  Medication Sig   albuterol  (PROVENTIL  HFA;VENTOLIN  HFA) 108 (90 Base) MCG/ACT inhaler Inhale 2 puffs into the lungs every 4 (four) hours as needed for wheezing or shortness of breath.   ALPRAZolam  (XANAX ) 1 MG tablet TAKE 1 TABLET BY MOUTH EVERY 8 HOURS AS NEEDED FOR ANXIETY   amoxicillin  (AMOXIL ) 875 MG tablet Take 875 mg by mouth 2 (two) times daily.   celecoxib  (CELEBREX ) 200 MG capsule Take 1 capsule (200 mg total) by mouth daily.   clonazePAM  (KLONOPIN ) 0.5 MG tablet Take 1 tablet (0.5 mg total) by mouth 3 (three) times daily as needed for anxiety.   docusate sodium  (COLACE) 100 MG capsule Take 1 capsule (100 mg total) by mouth 2 (two) times daily.   gabapentin   (NEURONTIN ) 300 MG capsule Take 1 capsule by mouth three times a day.   ipratropium (ATROVENT ) 0.06 % nasal spray SPRAY 2 SPRAYS INTO EACH NOSTRIL 4 TIMES A DAY.   levocetirizine (XYZAL ) 5 MG tablet TAKE 1 TABLET BY MOUTH EVERY DAY IN THE EVENING   mometasone  (ELOCON ) 0.1 % cream APPLY TO AFFECTED AREA TOPICALLY EVERY DAY   Multiple Vitamins-Minerals (EQ VISION FORMULA 50+ PO) Take 1 capsule by mouth daily.   oxyCODONE -acetaminophen  (PERCOCET) 10-325 MG tablet Take 1 tablet by mouth every 4 (four) hours as needed for pain.   pantoprazole  (PROTONIX ) 40 MG tablet TAKE 1 TABLET (40 MG TOTAL) BY MOUTH DAILY. COURTESY REFILL. ANNUAL NEEDED FOR FUTURE REFILLS.   Semaglutide -Weight Management (WEGOVY ) 0.5 MG/0.5ML SOAJ Inject 0.5 mg into the skin once a week.   sertraline  (ZOLOFT ) 50 MG tablet TAKE 1 AND 1/2 TABLETS BY MOUTH DAILY   telmisartan -hydrochlorothiazide  (MICARDIS  HCT) 80-12.5 MG tablet TAKE 1 TABLET BY MOUTH EVERY DAY   traZODone  (DESYREL ) 50 MG tablet TAKE 1 TABLET BY MOUTH EVERYDAY AT BEDTIME   valACYclovir  (VALTREX ) 1000 MG tablet Take 2 tablets (2,000 mg total) by mouth 2 (two) times daily.   zolpidem  (AMBIEN ) 10 MG tablet TAKE 1 TABLET BY MOUTH EVERY DAY AT BEDTIME AS NEEDED FOR SLEEP   No current facility-administered medications for this visit. (Other)  REVIEW OF SYSTEMS: ROS   Positive for: Gastrointestinal, Neurological, Eyes Negative for: Constitutional, Skin, Genitourinary, Musculoskeletal, HENT, Endocrine, Cardiovascular, Respiratory, Psychiatric, Allergic/Imm, Heme/Lymph Last edited by Elnor Avelina RAMAN, COT on 10/20/2024 12:48 PM.        ALLERGIES Allergies  Allergen Reactions   Cymbalta [Duloxetine Hcl] Swelling   PAST MEDICAL HISTORY Past Medical History:  Diagnosis Date   Allergy    Anemia    Anxiety    Arthritis    Blood transfusion without reported diagnosis    Cataract    GERD (gastroesophageal reflux disease)    Hypertension    Hypertensive  retinopathy    OU   Macular degeneration    Past Surgical History:  Procedure Laterality Date   ABDOMINAL HYSTERECTOMY     ACHILLES TENDON REPAIR Bilateral    APPENDECTOMY     BACK SURGERY     BICEPT TENODESIS Right 04/15/2023   Procedure: RIGHT BICEPS TENODESIS;  Surgeon: Addie Cordella Hamilton, MD;  Location: [facility] OR;  Service: Orthopedics;  Laterality: Right;   BILATERAL CARPAL TUNNEL RELEASE     CHOLECYSTECTOMY     COLONOSCOPY     EYE SURGERY Right 09/18/2022   cataract extraction   HERNIA REPAIR     JOINT REPLACEMENT     MELANOMA EXCISION Right 09/15/2017   Procedure: EXCISION LIPOSARCOMA RIGHT ARM;  Surgeon: Sebastian Moles, MD;  Location[facility] OR;  Service: General;  Laterality: Right;   REVERSE SHOULDER ARTHROPLASTY Right 04/15/2023   Procedure: RIGHT REVERSE SHOULDER ARTHROPLASTY;  Surgeon: Addie Cordella Hamilton, MD;  Location: [facility] OR;  Service: Orthopedics;  Laterality: Right;   SPINE SURGERY     FAMILY HISTORY Family History  Problem Relation Age of Onset   Hypertension Mother    Cancer Sister    Stroke Maternal Grandmother    SOCIAL HISTORY Social History   Tobacco Use   Smoking status: Never   Smokeless tobacco: Never  Vaping Use   Vaping status: Never Used  Substance Use Topics   Alcohol use: Yes    Alcohol/week: 1.0 standard drink of alcohol    Types: 1 Glasses of wine per week   Drug use: No       OPHTHALMIC EXAM: Base Eye Exam     Visual Acuity (Snellen - Linear)       Right Left   Dist cc 20/40 20/20   Dist ph cc 20/25 -2     Correction: Glasses         Tonometry (Tonopen, 12:52 PM)       Right Left   Pressure 19 21         Pupils       Pupils Dark Light Shape React APD   Right PERRL 3 2 Round Brisk None   Left PERRL 3 2 Round Brisk None         Visual Fields       Left Right    Full Full         Extraocular Movement       Right Left    Full, Ortho Full, Ortho         Neuro/Psych     Oriented x3: Yes    Mood/Affect: Normal         Dilation     Both eyes: 1.0% Mydriacyl, 2.5% Phenylephrine  @ 12:54 PM           Slit Lamp and Fundus Exam     Slit Lamp Exam  Right Left   Lids/Lashes Dermatochalasis - upper lid, mild Meibomian gland dysfunction Dermatochalasis - upper lid, mild Meibomian gland dysfunction   Conjunctiva/Sclera White and quiet White and quiet   Cornea 2-3+ fine Punctate epithelial erosions 2+ fine Punctate epithelial erosions inferiorly   Anterior Chamber deep, clear, narrow temporal angle deep, clear, narrow temporal angle   Iris Round and dilated Round and dilated   Lens PCIOL in good position 2-3+ Nuclear sclerosis, 2-3+ Cortical cataract   Anterior Vitreous Vitreous syneresis, Posterior vitreous detachment, vitreous condensations Vitreous syneresis, Posterior vitreous detachment, mild vitreous condensations         Fundus Exam       Right Left   Disc Pink and Sharp, Compact, +PPP Pink and Sharp, temporal PPP, Compact   C/D Ratio 0.2 0.1   Macula Blunted foveal reflex, drusen, RPE mottling, clumping and atrophy, +CNV, Stable resolution of central SRF, No heme, persistent low PED Flat, good foveal reflex, scattered Drusen, RPE mottling and clumping, focal PEDs, No heme or edema   Vessels attenuated, Tortuous attenuated, Tortuous   Periphery Attached, mild reticular degeneration, no heme Attached, no heme           Refraction     Wearing Rx       Sphere Cylinder Axis Add   Right +2.00 +0.50 008 +2.50   Left +2.50 +1.00 154 +2.50    Type: PAL           IMAGING AND PROCEDURES  Imaging and Procedures for @TODAY @  OCT, Retina - OU - Both Eyes       Right Eye Quality was borderline. Central Foveal Thickness: 231. Progression has been stable. Findings include normal foveal contour, no IRF, no SRF, retinal drusen , pigment epithelial detachment, outer retinal atrophy (Stable improvement in central SRF overlying low, stable PEDs, central ORA,  no fluid).   Left Eye Quality was good. Central Foveal Thickness: 271. Progression has been stable. Findings include normal foveal contour, no IRF, no SRF, retinal drusen .   Notes *Images captured and stored on drive  Diagnosis / Impression:  OD: stable improvement in central SRF overlying low, stable PEDs, central ORA, no fluid OS: non-exu ARMD -- stable  Clinical management:  See below  Abbreviations: NFP - Normal foveal profile. CME - cystoid macular edema. PED - pigment epithelial detachment. IRF - intraretinal fluid. SRF - subretinal fluid. EZ - ellipsoid zone. ERM - epiretinal membrane. ORA - outer retinal atrophy. ORT - outer retinal tubulation. SRHM - subretinal hyper-reflective material              ASSESSMENT/PLAN:    ICD-10-CM   1. Exudative age-related macular degeneration of right eye with active choroidal neovascularization (HCC)  H35.3211 OCT, Retina - OU - Both Eyes    2. Intermediate stage nonexudative age-related macular degeneration of left eye  H35.3122     3. Essential hypertension  I10     4. Hypertensive retinopathy of both eyes  H35.033     5. Combined forms of age-related cataract of left eye  H25.812     6. Pseudophakia  Z96.1     7. Glaucoma suspect of both eyes  H40.003     8. Dry eyes  H04.123      1. Exudative age related macular degeneration, right eye  - FA (03.10.20) shows +CNV w/ staining/leakage  - repeat FA 09.13.21 shows interval increase in perifoveal leakage OD - s/p IVA OD #1 (03.10.20), #2 (04.13.20), #3 (05.11.20), #4 (06.23.20), #  5 (8.18.20), #6 (10.27.20), #7 (01.26.21), #8 (04.26.21), #9 (08.02.21), #10 (09.13.21), #11 (10.11.21), #12 (11.9.21), #13 (12.14.21), #14 (1.24.22), #15 (3.15.22), #16 (05.24.22), #17 (8.9.22), #18 (10.18.22), #19 (01.03.23), #20 (03.29.23)  -- IVA resistance ============================= - s/p IVE OD #1 (06.07.23), #2 (07.19.23), #3 (09.01.23), #4 (10.13.23), #5 (11.10.23), #6 (12.22.23)  --  IVE resistance ============================== - s/p IVV OD #1 (02.02.24), #2 (03.15.24), #3 (05.01.24), #4 (06.05.24), #5 (07.17.24), #6 (09.11.24), #7 (10.06.24), #8 (01.08.25), #8 (03.19.25) #9(06.20.25), #10 (09.19.25) - pt was doing very well with q3 mo maintenance injections, but then developed interval increases in central SRF over several visits -- IVA and IVE resistance  - BCVA OD 20/25 -- improved - OCT today stable improvement in central SRF overlying low, stable PEDs, central ORA at 13 weeks   - recommend IVV OD #11 today, 12.17.25 w/ f/u in 13 wks  - pt wishes to be treated with IVV OD  - RBA of procedure discussed, questions answered  - IVV informed consent obtained and signed, 02.02.24 (OD)  - see procedure note  - Eylea  is being paid for through Medicare and [employer]   - f/u in 13 wks -- DFE/OCT/possible injection  2. Age related macular degeneration, non-exudative, left eye - The incidence, anatomy, and pathology of dry AMD, risk of progression, and the AREDS and AREDS 2 study including smoking risks discussed with patient.  - intermediate stage  - recommend amsler grid monitoring  3,4. Hypertensive retinopathy OU  - discussed importance of tight BP control  - monitor  5. Mixed form age related cataracts OS - The symptoms of cataract, surgical options, and treatments and risks were discussed with patient  - under the expert management of Dr. Fleeta  6. Pseudophakia OD  - s/p CE/IOL (Dr. Fleeta, 11.15.23)  - IOL in good position, doing well  - monitor  7. Glaucoma Suspect OU  - IOP 15,19  - under the expert management of Dr. Fleeta  8. Dry eyes OU  - decreased vision OD today mostly due to increased PEE / dry eyes  - pt reports stopping ATs after hearing about product recalls on ATs - recommend artificial tears and lubricating ointment as needed   Return in about 13 weeks (around 01/19/2025) for exu ARMD OD, DFE, OCT. '  Ophthalmic Meds Ordered this visit:  No orders of  the defined types were placed in this encounter.      This document serves as a record of services personally performed by Redell JUDITHANN Hans, MD, PhD. It was created on their behalf by Almetta Pesa, an ophthalmic technician. The creation of this record is the provider's dictation and/or activities during the visit.    Electronically signed by: Almetta Pesa, OA, 10/20/2024  1:18 PM  This document serves as a record of services personally performed by Redell JUDITHANN Hans, MD, PhD. It was created on their behalf by Wanda GEANNIE Keens, COT an ophthalmic technician. The creation of this record is the provider's dictation and/or activities during the visit.    Electronically signed by:  Wanda GEANNIE Keens, COT  10/20/2024 1:18 PM  Redell JUDITHANN Hans, M.D., Ph.D. Diseases & Surgery of the Retina and Vitreous [facility] 07/23/2024    Abbreviations: M myopia (nearsighted); A astigmatism; H hyperopia (farsighted); P presbyopia; Mrx spectacle prescription;  CTL contact lenses; OD right eye; OS left eye; OU both eyes  XT exotropia; ET esotropia; PEK punctate epithelial keratitis; PEE punctate epithelial erosions; DES dry eye syndrome; MGD  meibomian gland dysfunction; ATs artificial tears; PFAT's preservative free artificial tears; NSC nuclear sclerotic cataract; PSC posterior subcapsular cataract; ERM epi-retinal membrane; PVD posterior vitreous detachment; RD retinal detachment; DM diabetes mellitus; DR diabetic retinopathy; NPDR non-proliferative diabetic retinopathy; PDR proliferative diabetic retinopathy; CSME clinically significant macular edema; DME diabetic macular edema; dbh dot blot hemorrhages; CWS cotton wool spot; POAG primary open angle glaucoma; C/D cup-to-disc ratio; HVF humphrey visual field; GVF goldmann visual field; OCT optical coherence tomography; IOP intraocular pressure; BRVO Branch retinal vein occlusion; CRVO central retinal vein occlusion; CRAO central  retinal artery occlusion; BRAO branch retinal artery occlusion; RT retinal tear; SB scleral buckle; PPV pars plana vitrectomy; VH Vitreous hemorrhage; PRP panretinal laser photocoagulation; IVK intravitreal kenalog ; VMT vitreomacular traction; MH Macular hole;  NVD neovascularization of the disc; NVE neovascularization elsewhere; AREDS age related eye disease study; ARMD age related macular degeneration; POAG primary open angle glaucoma; EBMD epithelial/anterior basement membrane dystrophy; ACIOL anterior chamber intraocular lens; IOL intraocular lens; PCIOL posterior chamber intraocular lens; Phaco/IOL phacoemulsification with intraocular lens placement; PRK photorefractive keratectomy; LASIK laser assisted in situ keratomileusis; HTN hypertension; DM diabetes mellitus; COPD chronic obstructive pulmonary disease

## 2024-10-19 ENCOUNTER — Other Ambulatory Visit: Payer: Self-pay | Admitting: Family Medicine

## 2024-10-20 ENCOUNTER — Encounter (INDEPENDENT_AMBULATORY_CARE_PROVIDER_SITE_OTHER): Payer: Self-pay | Admitting: Ophthalmology

## 2024-10-20 ENCOUNTER — Ambulatory Visit (INDEPENDENT_AMBULATORY_CARE_PROVIDER_SITE_OTHER): Admitting: Ophthalmology

## 2024-10-20 ENCOUNTER — Telehealth: Payer: Self-pay

## 2024-10-20 DIAGNOSIS — Z961 Presence of intraocular lens: Secondary | ICD-10-CM | POA: Diagnosis not present

## 2024-10-20 DIAGNOSIS — I10 Essential (primary) hypertension: Secondary | ICD-10-CM | POA: Diagnosis not present

## 2024-10-20 DIAGNOSIS — H353122 Nonexudative age-related macular degeneration, left eye, intermediate dry stage: Secondary | ICD-10-CM | POA: Diagnosis not present

## 2024-10-20 DIAGNOSIS — H40003 Preglaucoma, unspecified, bilateral: Secondary | ICD-10-CM

## 2024-10-20 DIAGNOSIS — H04123 Dry eye syndrome of bilateral lacrimal glands: Secondary | ICD-10-CM | POA: Diagnosis not present

## 2024-10-20 DIAGNOSIS — H35033 Hypertensive retinopathy, bilateral: Secondary | ICD-10-CM | POA: Diagnosis not present

## 2024-10-20 DIAGNOSIS — H353211 Exudative age-related macular degeneration, right eye, with active choroidal neovascularization: Secondary | ICD-10-CM | POA: Diagnosis not present

## 2024-10-20 DIAGNOSIS — H25812 Combined forms of age-related cataract, left eye: Secondary | ICD-10-CM

## 2024-10-20 MED ORDER — FARICIMAB-SVOA 6 MG/0.05ML IZ SOSY
6.0000 mg | PREFILLED_SYRINGE | INTRAVITREAL | Status: AC | PRN
  Administered 2024-10-20: 14:00:00 6 mg via INTRAVITREAL

## 2024-10-20 NOTE — Telephone Encounter (Signed)
 Per Dr. Duanne:  Patient shouldn't take xanax  and klonopin  together.  I would like her to pick which one she wants to stay on and I would be glad to refill it.

## 2024-10-21 ENCOUNTER — Other Ambulatory Visit: Payer: Self-pay | Admitting: Family Medicine

## 2024-10-21 MED ORDER — ALPRAZOLAM 1 MG PO TABS: 1.0000 mg | ORAL_TABLET | Freq: Three times a day (TID) | ORAL | 0 refills | Status: AC | PRN

## 2024-10-22 ENCOUNTER — Encounter (INDEPENDENT_AMBULATORY_CARE_PROVIDER_SITE_OTHER): Admitting: Ophthalmology

## 2024-10-26 ENCOUNTER — Ambulatory Visit: Admitting: Family Medicine

## 2024-10-26 ENCOUNTER — Encounter: Payer: Self-pay | Admitting: Family Medicine

## 2024-10-26 VITALS — BP 122/72 | HR 71 | Temp 97.6°F | Ht 59.0 in | Wt 219.0 lb

## 2024-10-26 DIAGNOSIS — Z780 Asymptomatic menopausal state: Secondary | ICD-10-CM

## 2024-10-26 DIAGNOSIS — I10 Essential (primary) hypertension: Secondary | ICD-10-CM

## 2024-10-26 DIAGNOSIS — Z1231 Encounter for screening mammogram for malignant neoplasm of breast: Secondary | ICD-10-CM

## 2024-10-26 NOTE — Progress Notes (Signed)
 "  Subjective:    Patient ID: Faith Reeves, female    DOB: [birth year], 73 y.o.   MRN: [id number]  Patient presents today for her regular checkup.  She is currently using oxycodone  30 tablets a month occasionally for back pain due to her severe scoliosis.  She also uses Xanax  1 a day for anxiety.  I again explained to the patient not to combine oxycodone  and Xanax  due to possible respiratory depression.  Patient was taking Klonopin  after the death of her husband and I recommended we stop that to avoid confusion and potential polypharmacy.  She is due for a mammogram.  She is also due for a bone density test.  She is due for her regular lab work Past Medical History:  Diagnosis Date   Allergy    Anemia    Anxiety    Arthritis    Blood transfusion without reported diagnosis    Cataract    GERD (gastroesophageal reflux disease)    Hypertension    Hypertensive retinopathy    OU   Macular degeneration    Past Surgical History:  Procedure Laterality Date   ABDOMINAL HYSTERECTOMY     ACHILLES TENDON REPAIR Bilateral    APPENDECTOMY     BACK SURGERY     BICEPT TENODESIS Right 04/15/2023   Procedure: RIGHT BICEPS TENODESIS;  Surgeon: Addie Cordella Hamilton, MD;  Location: [facility] OR;  Service: Orthopedics;  Laterality: Right;   BILATERAL CARPAL TUNNEL RELEASE     CHOLECYSTECTOMY     COLONOSCOPY     EYE SURGERY Right 09/18/2022   cataract extraction   HERNIA REPAIR     JOINT REPLACEMENT     MELANOMA EXCISION Right 09/15/2017   Procedure: EXCISION LIPOSARCOMA RIGHT ARM;  Surgeon: Sebastian Moles, MD;  Location: [facility] OR;  Service: General;  Laterality: Right;   REVERSE SHOULDER ARTHROPLASTY Right 04/15/2023   Procedure: RIGHT REVERSE SHOULDER ARTHROPLASTY;  Surgeon: Addie Cordella Hamilton, MD;  Location: [facility] OR;  Service: Orthopedics;  Laterality: Right;   SPINE SURGERY     Current Outpatient Medications on File Prior to Visit  Medication Sig Dispense Refill   albuterol  (PROVENTIL  HFA;VENTOLIN  HFA) 108  (90 Base) MCG/ACT inhaler Inhale 2 puffs into the lungs every 4 (four) hours as needed for wheezing or shortness of breath. 1 Inhaler 0   ALPRAZolam  (XANAX ) 1 MG tablet Take 1 tablet (1 mg total) by mouth every 8 (eight) hours as needed. for anxiety 30 tablet 0   amoxicillin  (AMOXIL ) 875 MG tablet Take 875 mg by mouth 2 (two) times daily.     celecoxib  (CELEBREX ) 200 MG capsule Take 1 capsule (200 mg total) by mouth daily. 90 capsule 1   docusate sodium  (COLACE) 100 MG capsule Take 1 capsule (100 mg total) by mouth 2 (two) times daily. 10 capsule 0   gabapentin  (NEURONTIN ) 300 MG capsule Take 1 capsule by mouth three times a day. 90 capsule 5   ipratropium (ATROVENT ) 0.06 % nasal spray SPRAY 2 SPRAYS INTO EACH NOSTRIL 4 TIMES A DAY. 15 mL 1   levocetirizine (XYZAL ) 5 MG tablet TAKE 1 TABLET BY MOUTH EVERY DAY IN THE EVENING 90 tablet 3   mometasone  (ELOCON ) 0.1 % cream APPLY TO AFFECTED AREA TOPICALLY EVERY DAY 45 g 2   Multiple Vitamins-Minerals (EQ VISION FORMULA 50+ PO) Take 1 capsule by mouth daily.     oxyCODONE -acetaminophen  (PERCOCET) 10-325 MG tablet Take 1 tablet by mouth every 4 (four) hours as needed for pain. 30 tablet 0  pantoprazole  (PROTONIX ) 40 MG tablet TAKE 1 TABLET (40 MG TOTAL) BY MOUTH DAILY. COURTESY REFILL. ANNUAL NEEDED FOR FUTURE REFILLS. 90 tablet 1   Semaglutide -Weight Management (WEGOVY ) 0.5 MG/0.5ML SOAJ Inject 0.5 mg into the skin once a week. 2 mL 3   sertraline  (ZOLOFT ) 50 MG tablet TAKE 1 AND 1/2 TABLETS BY MOUTH DAILY 135 tablet 1   telmisartan -hydrochlorothiazide  (MICARDIS  HCT) 80-12.5 MG tablet TAKE 1 TABLET BY MOUTH EVERY DAY 90 tablet 1   traZODone  (DESYREL ) 50 MG tablet TAKE 1 TABLET BY MOUTH EVERYDAY AT BEDTIME 90 tablet 1   valACYclovir  (VALTREX ) 1000 MG tablet Take 2 tablets (2,000 mg total) by mouth 2 (two) times daily. 4 tablet 2   zolpidem  (AMBIEN ) 10 MG tablet TAKE 1 TABLET BY MOUTH EVERY DAY AT BEDTIME AS NEEDED FOR SLEEP 30 tablet 5   No current  facility-administered medications on file prior to visit.   Allergies  Allergen Reactions   Cymbalta [Duloxetine Hcl] Swelling   Social History   Socioeconomic History   Marital status: Married    Spouse name: Not on file   Number of children: Not on file   Years of education: Not on file   Highest education level: Not on file  Occupational History   Not on file  Tobacco Use   Smoking status: Never   Smokeless tobacco: Never  Vaping Use   Vaping status: Never Used  Substance and Sexual Activity   Alcohol use: Yes    Alcohol/week: 1.0 standard drink of alcohol    Types: 1 Glasses of wine per week   Drug use: No   Sexual activity: Never  Other Topics Concern   Not on file  Social History Narrative   Recent death of son; lives with husband; originally from [place] ; [place]    Social Drivers of Health   Tobacco Use: Low Risk (10/20/2024)   Patient History    Smoking Tobacco Use: Never    Smokeless Tobacco Use: Never    Passive Exposure: Not on file  Financial Resource Strain: Low Risk (10/16/2023)   Overall Financial Resource Strain (CARDIA)    Difficulty of Paying Living Expenses: Not hard at all  Food Insecurity: No Food Insecurity (10/16/2023)   Hunger Vital Sign    Worried About Running Out of Food in the Last Year: Never true    Ran Out of Food in the Last Year: Never true  Transportation Needs: No Transportation Needs (10/16/2023)   PRAPARE - Administrator, Civil Service (Medical): No    Lack of Transportation (Non-Medical): No  Physical Activity: Insufficiently Active (10/16/2023)   Exercise Vital Sign    Days of Exercise per Week: 3 days    Minutes of Exercise per Session: 30 min  Stress: No Stress Concern Present (10/16/2023)   [employer] of Occupational Health - Occupational Stress Questionnaire    Feeling of Stress : Not at all  Social Connections: Moderately Isolated (10/16/2023)   Social Connection  and Isolation Panel    Frequency of Communication with Friends and Family: More than three times a week    Frequency of Social Gatherings with Friends and Family: More than three times a week    Attends Religious Services: Never    Database Administrator or Organizations: No    Attends Banker Meetings: Never    Marital Status: Married  Catering Manager Violence: Not At Risk (10/16/2023)   Humiliation, Afraid, Rape,  and Kick questionnaire    Fear of Current or Ex-Partner: No    Emotionally Abused: No    Physically Abused: No    Sexually Abused: No  Depression (PHQ2-9): Low Risk (11/24/2023)   Depression (PHQ2-9)    PHQ-2 Score: 0  Alcohol Screen: Low Risk (10/16/2023)   Alcohol Screen    Last Alcohol Screening Score (AUDIT): 0  Housing: Unknown (10/16/2023)   Housing Stability Vital Sign    Unable to Pay for Housing in the Last Year: No    Number of Times Moved in the Last Year: Not on file    Homeless in the Last Year: No  Utilities: Not At Risk (10/16/2023)   AHC Utilities    Threatened with loss of utilities: No  Health Literacy: Adequate Health Literacy (10/16/2023)   B1300 Health Literacy    Frequency of need for help with medical instructions: Never     Review of Systems  All other systems reviewed and are negative.      Objective:   Physical Exam Constitutional:      Appearance: She is obese.  Cardiovascular:     Rate and Rhythm: Normal rate and regular rhythm.     Heart sounds: Normal heart sounds.  Pulmonary:     Effort: Pulmonary effort is normal.     Breath sounds: Normal breath sounds.  Neurological:     Mental Status: She is alert.  Psychiatric:        Attention and Perception: Attention and perception normal.        Speech: Speech normal.        Behavior: Behavior normal.        Cognition and Memory: Cognition and memory normal.        Judgment: Judgment normal.           Assessment & Plan:  Benign essential HTN - Plan: CBC  with Differential/Platelet, Comprehensive metabolic panel with GFR, Lipid panel  Encounter for screening mammogram for malignant neoplasm of breast - Plan: MM Digital Screening  Postmenopausal estrogen deficiency - Plan: DG Bone Density Blood pressure today is acceptable.  Check CBC CMP and fasting lipid panel.  Would like to see LDL cholesterol less than 899.  Schedule the patient for mammogram.  Schedule the patient for a bone density.  She has had her flu shot, her RSV shot, and her COVID shot at a pharmacy.  Recommended that she try to be careful and not combine oxycodone  and Xanax .  She is aware of this and does not take the medication at the same time.  We will discontinue Klonopin .  I will schedule the patient for a bone density test as her last bone density was 2020. "

## 2024-10-27 LAB — CBC WITH DIFFERENTIAL/PLATELET
Absolute Lymphocytes: 1296 {cells}/uL (ref 850–3900)
Absolute Monocytes: 384 {cells}/uL (ref 200–950)
Basophils Absolute: 30 {cells}/uL (ref 0–200)
Basophils Relative: 0.5 %
Eosinophils Absolute: 192 {cells}/uL (ref 15–500)
Eosinophils Relative: 3.2 %
HCT: 43.8 % (ref 35.9–46.0)
Hemoglobin: 14 g/dL (ref 11.7–15.5)
MCH: 27.9 pg (ref 27.0–33.0)
MCHC: 32 g/dL (ref 31.6–35.4)
MCV: 87.4 fL (ref 81.4–101.7)
MPV: 12.1 fL (ref 7.5–12.5)
Monocytes Relative: 6.4 %
Neutro Abs: 4098 {cells}/uL (ref 1500–7800)
Neutrophils Relative %: 68.3 %
Platelets: 208 Thousand/uL (ref 140–400)
RBC: 5.01 Million/uL (ref 3.80–5.10)
RDW: 13.5 % (ref 11.0–15.0)
Total Lymphocyte: 21.6 %
WBC: 6 Thousand/uL (ref 3.8–10.8)

## 2024-10-27 LAB — COMPREHENSIVE METABOLIC PANEL WITH GFR
AG Ratio: 1.2 (calc) (ref 1.0–2.5)
ALT: 11 U/L (ref 6–29)
AST: 17 U/L (ref 10–35)
Albumin: 3.9 g/dL (ref 3.6–5.1)
Alkaline phosphatase (APISO): 123 U/L (ref 37–153)
BUN/Creatinine Ratio: 16 (calc) (ref 6–22)
BUN: 20 mg/dL (ref 7–25)
CO2: 31 mmol/L (ref 20–32)
Calcium: 9.4 mg/dL (ref 8.6–10.4)
Chloride: 102 mmol/L (ref 98–110)
Creat: 1.22 mg/dL — ABNORMAL HIGH (ref 0.60–1.00)
Globulin: 3.2 g/dL (ref 1.9–3.7)
Glucose, Bld: 93 mg/dL (ref 65–99)
Potassium: 4 mmol/L (ref 3.5–5.3)
Sodium: 139 mmol/L (ref 135–146)
Total Bilirubin: 0.7 mg/dL (ref 0.2–1.2)
Total Protein: 7.1 g/dL (ref 6.1–8.1)
eGFR: 46 mL/min/1.73m2 — ABNORMAL LOW

## 2024-10-27 LAB — LIPID PANEL
Cholesterol: 153 mg/dL
HDL: 60 mg/dL
LDL Cholesterol (Calc): 76 mg/dL
Non-HDL Cholesterol (Calc): 93 mg/dL
Total CHOL/HDL Ratio: 2.6 (calc)
Triglycerides: 91 mg/dL

## 2024-10-28 ENCOUNTER — Ambulatory Visit: Payer: Self-pay | Admitting: Family Medicine

## 2024-10-29 NOTE — Telephone Encounter (Addendum)
 Called patient to advised of lab results per Dr. Duanne. Patient understanding of lab results and to limit use of Celebrex  to prevent kidney damage from getting worse.

## 2024-11-08 ENCOUNTER — Ambulatory Visit: Payer: Self-pay | Admitting: Family Medicine

## 2024-11-08 NOTE — Telephone Encounter (Signed)
 FYI Only or Action Required?: Action required by provider: Requesting abx.  Patient was last seen in primary care on 10/26/2024 by Duanne Butler DASEN, MD.  Called Nurse Triage reporting Blister.  Symptoms began several days ago.  Interventions attempted: OTC medications: OTC fever blister ointment.  Symptoms are: gradually worsening.  Triage Disposition: See Physician Within 24 Hours  Patient/caregiver understands and will follow disposition?: No, refuses disposition Reason for Disposition  [1] Spreading redness around cold sore AND [2] no fever  Answer Assessment - Initial Assessment Questions Was in [place], stated it was so cold the sores started, in contact with people who had the Flu. Patient reports not being able to go into the office as she cannot drive far, her husband was the driver and he has passed away. [facility]. Please advise. Requesting call back [phone]  1. APPEARANCE: What does the cold sore look like? (e.g., small bumps or blisters on the outer lip)     Whole upper lips looks like someone punched me at least 2 on the bottom, blistering and breaking, and reform, no scabbing  2. SIZE: How large an area is involved with the cold sores? (e.g., inches, cm or compare to coins)     So joined together, cannot tell  3. LOCATION: Which part of the lip is involved?     Upper and lower lips  4. ONSET: When did the cold sore begin?     1-2 days ago  5. RECURRENT BLISTERS: Have you had cold sores before? If Yes, ask: When was the last time? How many times a year?     Unsure  6. TREATMENT: Are you using any medicines for cold sores? (e.g., over-the-counter cream, antiviral pills)     OTC fever blister treatment  7. OTHER SYMPTOMS: Do you have any other symptoms? (e.g., fever, sores inside mouth)     Head congestion, feels a sore throat coming on, cannot get warm, no energy, headache  Protocols used: Cold Sores  (Fever Blisters)-A-AH  Copied from CRM #[id number]. Topic: Clinical - Medication Question >> Nov 08, 2024  7:41 AM Olam RAMAN wrote: Reason for CRM: Pt would like a fever blister medication. Came back from [place] and has blisters now

## 2024-11-08 NOTE — Telephone Encounter (Unsigned)
 Copied from CRM [id number]. Topic: Clinical - Medication Refill >> Nov 08, 2024  7:39 AM Olam RAMAN wrote: Medication: oxyCODONE -acetaminophen  (PERCOCET) 10-325 MG tablet  Has the patient contacted their pharmacy? No (Agent: If no, request that the patient contact the pharmacy for the refill. If patient does not wish to contact the pharmacy document the reason why and proceed with request.) (Agent: If yes, when and what did the pharmacy advise?)  This is the patient's preferred pharmacy:  [id number] [place], [place] - [address] [place] [address] Phone: [phone] Fax: [phone]  Is this the correct pharmacy for this prescription? Yes If no, delete pharmacy and type the correct one.   Has the prescription been filled recently? Yes  Is the patient out of the medication? No  Has the patient been seen for an appointment in the last year OR does the patient have an upcoming appointment? Yes  Can we respond through MyChart? No  Agent: Please be advised that Rx refills may take up to 3 business days. We ask that you follow-up with your pharmacy.

## 2024-11-09 ENCOUNTER — Other Ambulatory Visit: Payer: Self-pay | Admitting: Family Medicine

## 2024-11-09 MED ORDER — OXYCODONE-ACETAMINOPHEN 10-325 MG PO TABS: 1.0000 | ORAL_TABLET | ORAL | 0 refills | Status: DC | PRN

## 2024-11-09 MED ORDER — VALACYCLOVIR HCL 1 G PO TABS: 2000.0000 mg | ORAL_TABLET | Freq: Two times a day (BID) | ORAL | 2 refills | Status: AC

## 2024-11-10 ENCOUNTER — Other Ambulatory Visit: Payer: Self-pay | Admitting: Family Medicine

## 2024-11-10 DIAGNOSIS — M4120 Other idiopathic scoliosis, site unspecified: Secondary | ICD-10-CM

## 2024-11-10 DIAGNOSIS — L299 Pruritus, unspecified: Secondary | ICD-10-CM

## 2024-11-18 ENCOUNTER — Encounter (HOSPITAL_COMMUNITY): Payer: Self-pay

## 2024-11-18 ENCOUNTER — Ambulatory Visit (HOSPITAL_COMMUNITY)
Admission: RE | Admit: 2024-11-18 | Discharge: 2024-11-18 | Disposition: A | Discharge status: Home / Self Care | Source: Ambulatory Visit | Attending: Family Medicine | Admitting: Family Medicine

## 2024-11-18 ENCOUNTER — Ambulatory Visit (HOSPITAL_COMMUNITY)
Admission: RE | Admit: 2024-11-18 | Discharge: 2024-11-18 | Disposition: A | Source: Ambulatory Visit | Attending: Family Medicine | Admitting: Family Medicine

## 2024-11-18 DIAGNOSIS — Z1231 Encounter for screening mammogram for malignant neoplasm of breast: Secondary | ICD-10-CM | POA: Diagnosis present

## 2024-11-18 DIAGNOSIS — Z780 Asymptomatic menopausal state: Secondary | ICD-10-CM | POA: Diagnosis present

## 2024-11-22 ENCOUNTER — Other Ambulatory Visit: Payer: Self-pay | Admitting: Family Medicine

## 2024-11-22 ENCOUNTER — Telehealth: Payer: Self-pay

## 2024-11-22 MED ORDER — ZOLPIDEM TARTRATE 10 MG PO TABS: ORAL_TABLET | ORAL | 5 refills | Status: AC

## 2024-11-22 NOTE — Telephone Encounter (Signed)
 Pt called requesting a refill on her Ambien  with refills, if possible. Last refill was in July. Thank you!

## 2024-11-24 ENCOUNTER — Ambulatory Visit

## 2024-11-24 DIAGNOSIS — Z0000 Encounter for general adult medical examination without abnormal findings: Secondary | ICD-10-CM

## 2024-11-24 NOTE — Patient Instructions (Signed)
 Faith Reeves,  Thank you for taking the time for your Medicare Wellness Visit. I appreciate your continued commitment to your health goals. Please review the care plan we discussed, and feel free to reach out if I can assist you further.  Please note that Annual Wellness Visits do not include a physical exam. Some assessments may be limited, especially if the visit was conducted virtually. If needed, we may recommend an in-person follow-up with your provider.  Ongoing Care Seeing your primary care provider every 3 to 6 months helps us  monitor your health and provide consistent, personalized care.   Referrals If a referral was made during today's visit and you haven't received any updates within two weeks, please contact the referred provider directly to check on the status.  Recommended Screenings:  Health Maintenance  Topic Date Due   COVID-19 Vaccine (3 - 2025-26 season) 07/05/2024   Breast Cancer Screening  11/18/2025   Medicare Annual Wellness Visit  11/24/2025   Colon Cancer Screening  03/08/2032   Pneumococcal Vaccine for age over 75  Completed   Flu Shot  Completed   Osteoporosis screening with Bone Density Scan  Completed   Hepatitis C Screening  Completed   Zoster (Shingles) Vaccine  Completed   Meningitis B Vaccine  Aged Out   DTaP/Tdap/Td vaccine  Discontinued       11/24/2024    9:57 AM  Advanced Directives  Does Patient Have a Medical Advance Directive? No  Would patient like information on creating a medical advance directive? Yes (MAU/Ambulatory/Procedural Areas - Information given)   Information on Advanced Care Planning can be found at [place]  Secretary of [facility] Advance Health Care Directives Advance Health Care Directives ([email or web address]   Vision: Annual vision screenings are recommended for early detection of glaucoma, cataracts, and diabetic retinopathy. These exams can also reveal signs of chronic conditions such as diabetes and high blood  pressure.  Dental: Annual dental screenings help detect early signs of oral cancer, gum disease, and other conditions linked to overall health, including heart disease and diabetes.  Please see the attached documents for additional preventive care recommendations.

## 2024-11-24 NOTE — Progress Notes (Signed)
 "  Chief Complaint  Patient presents with   Medicare Wellness     Subjective:   Faith Reeves is a 74 y.o. female who presents for a Medicare Annual Wellness Visit.  Visit info / Clinical Intake: Medicare Wellness Visit Type:: Subsequent Annual Wellness Visit Persons participating in visit and providing information:: patient Medicare Wellness Visit Mode:: Telephone If telephone:: video declined Since this visit was completed virtually, some vitals may be partially provided or unavailable. Missing vitals are due to the limitations of the virtual format.: Unable to obtain vitals - no equipment If Telephone or Video please confirm:: I connected with patient using audio/video enable telemedicine. I verified patient identity with two identifiers, discussed telehealth limitations, and patient agreed to proceed. Patient Location:: home Provider Location:: office Interpreter Needed?: No Pre-visit prep was completed: yes AWV questionnaire completed by patient prior to visit?: no Living arrangements:: (!) lives alone Patient's Overall Health Status Rating: good Typical amount of pain: some Does pain affect daily life?: no Are you currently prescribed opioids?: (!) yes  Dietary Habits and Nutritional Risks How many meals a day?: 3 Eats fruit and vegetables daily?: yes Most meals are obtained by: preparing own meals In the last 2 weeks, have you had any of the following?: none Diabetic:: no  Functional Status Activities of Daily Living (to include ambulation/medication): Independent Ambulation: Independent Medication Administration: Independent Home Management (perform basic housework or laundry): Independent Manage your own finances?: yes Primary transportation is: driving; family / friends Concerns about vision?: no *vision screening is required for WTM* Concerns about hearing?: no  Fall Screening Falls in the past year?: 0 Number of falls in past year: 0 Was there an injury with  Fall?: 0 Fall Risk Category Calculator: 0 Patient Fall Risk Level: Low Fall Risk  Fall Risk Patient at Risk for Falls Due to: No Fall Risks Fall risk Follow up: Falls evaluation completed; Education provided; Falls prevention discussed  Home and Transportation Safety: All rugs have non-skid backing?: yes All stairs or steps have railings?: yes Grab bars in the bathtub or shower?: yes Have non-skid surface in bathtub or shower?: yes Good home lighting?: yes Regular seat belt use?: yes Hospital stays in the last year:: no  Cognitive Assessment Difficulty concentrating, remembering, or making decisions? : no Will 6CIT or Mini Cog be Completed: no 6CIT or Mini Cog Declined: patient alert, oriented, able to answer questions appropriately and recall recent events  Advance Directives (For Healthcare) Does Patient Have a Medical Advance Directive?: No Would patient like information on creating a medical advance directive?: Yes (MAU/Ambulatory/Procedural Areas - Information given)  Reviewed/Updated  Reviewed/Updated: Reviewed All (Medical, Surgical, Family, Medications, Allergies, Care Teams, Patient Goals)    Allergies (verified) Cymbalta [duloxetine hcl]   Current Medications (verified) Outpatient Encounter Medications as of 11/24/2024  Medication Sig   albuterol  (PROVENTIL  HFA;VENTOLIN  HFA) 108 (90 Base) MCG/ACT inhaler Inhale 2 puffs into the lungs every 4 (four) hours as needed for wheezing or shortness of breath.   ALPRAZolam  (XANAX ) 1 MG tablet Take 1 tablet (1 mg total) by mouth every 8 (eight) hours as needed. for anxiety   amoxicillin  (AMOXIL ) 875 MG tablet Take 875 mg by mouth 2 (two) times daily.   celecoxib  (CELEBREX ) 200 MG capsule Take 1 capsule (200 mg total) by mouth daily.   docusate sodium  (COLACE) 100 MG capsule Take 1 capsule (100 mg total) by mouth 2 (two) times daily.   gabapentin  (NEURONTIN ) 300 MG capsule TAKE 1 CAPSULE BY MOUTH THREE TIMES  A DAY    ipratropium (ATROVENT ) 0.06 % nasal spray SPRAY 2 SPRAYS INTO EACH NOSTRIL 4 TIMES A DAY.   levocetirizine (XYZAL ) 5 MG tablet TAKE 1 TABLET BY MOUTH EVERY DAY IN THE EVENING   mometasone  (ELOCON ) 0.1 % cream APPLY TO AFFECTED AREA TOPICALLY EVERY DAY   Multiple Vitamins-Minerals (EQ VISION FORMULA 50+ PO) Take 1 capsule by mouth daily.   oxyCODONE -acetaminophen  (PERCOCET) 10-325 MG tablet Take 1 tablet by mouth every 4 (four) hours as needed for pain.   pantoprazole  (PROTONIX ) 40 MG tablet TAKE 1 TABLET (40 MG TOTAL) BY MOUTH DAILY. COURTESY REFILL. ANNUAL NEEDED FOR FUTURE REFILLS.   Semaglutide -Weight Management (WEGOVY ) 0.5 MG/0.5ML SOAJ Inject 0.5 mg into the skin once a week.   sertraline  (ZOLOFT ) 50 MG tablet TAKE 1 AND 1/2 TABLETS BY MOUTH DAILY   telmisartan -hydrochlorothiazide  (MICARDIS  HCT) 80-12.5 MG tablet TAKE 1 TABLET BY MOUTH EVERY DAY   traZODone  (DESYREL ) 50 MG tablet TAKE 1 TABLET BY MOUTH EVERYDAY AT BEDTIME   valACYclovir  (VALTREX ) 1000 MG tablet Take 2 tablets (2,000 mg total) by mouth 2 (two) times daily.   zolpidem  (AMBIEN ) 10 MG tablet TAKE 1 TABLET BY MOUTH EVERY DAY AT BEDTIME AS NEEDED FOR SLEEP   No facility-administered encounter medications on file as of 11/24/2024.    History: Past Medical History:  Diagnosis Date   Allergy    Anemia    Anxiety    Arthritis    Blood transfusion without reported diagnosis    Cataract    GERD (gastroesophageal reflux disease)    Hypertension    Hypertensive retinopathy    OU   Macular degeneration    Past Surgical History:  Procedure Laterality Date   ABDOMINAL HYSTERECTOMY     ACHILLES TENDON REPAIR Bilateral    APPENDECTOMY     BACK SURGERY     BICEPT TENODESIS Right 04/15/2023   Procedure: RIGHT BICEPS TENODESIS;  Surgeon: Addie Cordella Hamilton, MD;  Location: [facility] OR;  Service: Orthopedics;  Laterality: Right;   BILATERAL CARPAL TUNNEL RELEASE     CHOLECYSTECTOMY     COLONOSCOPY     EYE SURGERY Right 09/18/2022    cataract extraction   HERNIA REPAIR     JOINT REPLACEMENT     MELANOMA EXCISION Right 09/15/2017   Procedure: EXCISION LIPOSARCOMA RIGHT ARM;  Surgeon: Sebastian Moles, MD;  Location[facility] OR;  Service: General;  Laterality: Right;   REVERSE SHOULDER ARTHROPLASTY Right 04/15/2023   Procedure: RIGHT REVERSE SHOULDER ARTHROPLASTY;  Surgeon: Addie Cordella Hamilton, MD;  Location: [facility] OR;  Service: Orthopedics;  Laterality: Right;   SPINE SURGERY     Family History  Problem Relation Age of Onset   Hypertension Mother    Cancer Sister    Stroke Maternal Grandmother    Social History   Occupational History   Not on file  Tobacco Use   Smoking status: Never   Smokeless tobacco: Never  Vaping Use   Vaping status: Never Used  Substance and Sexual Activity   Alcohol use: Yes    Alcohol/week: 1.0 standard drink of alcohol    Types: 1 Glasses of wine per week   Drug use: No   Sexual activity: Never   Tobacco Counseling Counseling given: Not Answered  SDOH Screenings   Food Insecurity: No Food Insecurity (11/24/2024)  Housing: Low Risk (11/24/2024)  Transportation Needs: No Transportation Needs (11/24/2024)  Utilities: Not At Risk (11/24/2024)  Alcohol Screen: Low Risk (10/16/2023)  Depression (PHQ2-9): Medium Risk (11/24/2024)  Financial Resource Strain: Low Risk (10/16/2023)  Physical Activity: Insufficiently Active (11/24/2024)  Social Connections: Socially Isolated (11/24/2024)  Stress: No Stress Concern Present (11/24/2024)  Tobacco Use: Low Risk (11/24/2024)  Health Literacy: Adequate Health Literacy (11/24/2024)   See flowsheets for full screening details  Depression Screen PHQ 2 & 9 Depression Scale- Over the past 2 weeks, how often have you been bothered by any of the following problems? Little interest or pleasure in doing things: 2 Feeling down, depressed, or hopeless (PHQ Adolescent also includes...irritable): 2 PHQ-2 Total Score: 4 Trouble falling or staying asleep, or sleeping  too much: 1 Feeling tired or having little energy: 0 Poor appetite or overeating (PHQ Adolescent also includes...weight loss): 0 Feeling bad about yourself - or that you are a failure or have let yourself or your family down: 0 Trouble concentrating on things, such as reading the newspaper or watching television (PHQ Adolescent also includes...like school work): 0 Moving or speaking so slowly that other people could have noticed. Or the opposite - being so fidgety or restless that you have been moving around a lot more than usual: 0 Thoughts that you would be better off dead, or of hurting yourself in some way: 0 PHQ-9 Total Score: 5  Depression Treatment Depression Interventions/Treatment : Counseling; Medication     Goals Addressed             This Visit's Progress    Maintain health and independence   On track            Objective:    Today's Vitals   There is no height or weight on file to calculate BMI.  Hearing/Vision screen No results found. Immunizations and Health Maintenance Health Maintenance  Topic Date Due   COVID-19 Vaccine (3 - 2025-26 season) 07/05/2024   Mammogram  11/18/2025   Medicare Annual Wellness (AWV)  11/24/2025   Colonoscopy  03/08/2032   Pneumococcal Vaccine: 50+ Years  Completed   Influenza Vaccine  Completed   Bone Density Scan  Completed   Hepatitis C Screening  Completed   Zoster Vaccines- Shingrix  Completed   Meningococcal B Vaccine  Aged Out   DTaP/Tdap/Td  Discontinued        Assessment/Plan:  This is a routine wellness examination for Akiya.  Patient Care Team: Duanne Butler DASEN, MD as PCP - General (Family Medicine) Valdemar Rogue, MD as Consulting Physician (Ophthalmology) Vivian, Chad, OD [facility])  I have personally reviewed and noted the following in the patients chart:   Medical and social history Use of alcohol, tobacco or illicit drugs  Current medications and supplements including opioid  prescriptions. Functional ability and status Nutritional status Physical activity Advanced directives List of other physicians Hospitalizations, surgeries, and ER visits in previous 12 months Vitals Screenings to include cognitive, depression, and falls Referrals and appointments  No orders of the defined types were placed in this encounter.  In addition, I have reviewed and discussed with patient certain preventive protocols, quality metrics, and best practice recommendations. A written personalized care plan for preventive services as well as general preventive health recommendations were provided to patient.   Lavelle Charmaine Browner, LPN   8/78/7973   Return in 1 year (on 11/24/2025).  After Visit Summary: (Mail) Due to this being a telephonic visit, the after visit summary with patients personalized plan was offered to patient via mail   Nurse Notes: No voiced or noted concerns at this time  "

## 2024-12-02 ENCOUNTER — Other Ambulatory Visit: Payer: Self-pay | Admitting: Family Medicine

## 2024-12-02 DIAGNOSIS — T7840XD Allergy, unspecified, subsequent encounter: Secondary | ICD-10-CM

## 2024-12-02 NOTE — Telephone Encounter (Signed)
 Requested medication (s) are due for refill today - yes/no  Requested medication (s) are on the active medication list -yes  Future visit scheduled -yes  Last refill: oxycodone - 11/09/24 #30- non delegated Rx                  Atrovent - 09/24/24 15ml 1RF- off protocol- provider review   Notes to clinic: see above  Requested Prescriptions  Pending Prescriptions Disp Refills   oxyCODONE -acetaminophen  (PERCOCET) 10-325 MG tablet 30 tablet 0    Sig: Take 1 tablet by mouth every 4 (four) hours as needed for pain.     Not Delegated - Analgesics:  Opioid Agonist Combinations Failed - 12/02/2024  4:27 PM      Failed - This refill cannot be delegated      Failed - Urine Drug Screen completed in last 360 days      Passed - Valid encounter within last 3 months    Recent Outpatient Visits           1 month ago Benign essential HTN   [place] [facility] Pickard, Butler DASEN, MD   9 months ago Grief   [place] [facility] Duanne, Butler DASEN, MD   1 year ago Primary hypertension   [place] [facility] Duanne Butler DASEN, MD   1 year ago Benign essential HTN   [place] [facility] Duanne, Butler DASEN, MD   1 year ago Primary hypertension   [place] [facility] Duanne, Butler DASEN, MD               ipratropium (ATROVENT ) 0.06 % nasal spray 15 mL 1    Sig: SPRAY 2 SPRAYS INTO EACH NOSTRIL 4 TIMES A DAY.     Off-Protocol Failed - 12/02/2024  4:27 PM      Failed - Medication not assigned to a protocol, review manually.      Passed - Valid encounter within last 12 months    Recent Outpatient Visits           1 month ago Benign essential HTN   [place] [facility] Pickard, Butler DASEN, MD   9 months ago Grief   [place] [facility] Duanne, Butler DASEN, MD   1 year ago Primary hypertension   [place] [facility] Duanne, Butler DASEN, MD   1  year ago Benign essential HTN   [place] [facility] Pickard, Butler DASEN, MD   1 year ago Primary hypertension   [place] [facility] Duanne Butler DASEN, MD             Off-Protocol Failed - 12/02/2024  4:27 PM      Failed - Medication not assigned to a protocol, review manually.      Passed - Valid encounter within last 12 months    Recent Outpatient Visits           1 month ago Benign essential HTN   [place] [facility] Pickard, Butler DASEN, MD   9 months ago Grief   [place] [facility] Pickard, Butler DASEN, MD   1 year ago Primary hypertension   [place] [facility] Duanne, Butler DASEN, MD   1 year ago Benign essential HTN   [place] [facility] Pickard, Butler DASEN, MD   1 year ago  Primary hypertension   [place] [facility] Pickard, Butler DASEN, MD                 Requested Prescriptions  Pending Prescriptions Disp Refills   oxyCODONE -acetaminophen  (PERCOCET) 10-325 MG tablet 30 tablet 0    Sig: Take 1 tablet by mouth every 4 (four) hours as needed for pain.     Not Delegated - Analgesics:  Opioid Agonist Combinations Failed - 12/02/2024  4:27 PM      Failed - This refill cannot be delegated      Failed - Urine Drug Screen completed in last 360 days      Passed - Valid encounter within last 3 months    Recent Outpatient Visits           1 month ago Benign essential HTN   [place] [facility] Pickard, Butler DASEN, MD   9 months ago Grief   [place] [facility] Duanne, Butler DASEN, MD   1 year ago Primary hypertension   [place] [facility] Duanne Butler DASEN, MD   1 year ago Benign essential HTN   [place] [facility] Duanne, Butler DASEN, MD   1 year ago Primary hypertension   [place] [facility] Duanne, Butler DASEN, MD                ipratropium (ATROVENT ) 0.06 % nasal spray 15 mL 1    Sig: SPRAY 2 SPRAYS INTO EACH NOSTRIL 4 TIMES A DAY.     Off-Protocol Failed - 12/02/2024  4:27 PM      Failed - Medication not assigned to a protocol, review manually.      Passed - Valid encounter within last 12 months    Recent Outpatient Visits           1 month ago Benign essential HTN   [place] [facility] Pickard, Butler DASEN, MD   9 months ago Grief   [place] [facility] Duanne, Butler DASEN, MD   1 year ago Primary hypertension   [place] [facility] Duanne, Butler DASEN, MD   1 year ago Benign essential HTN   [place] [facility] Pickard, Butler DASEN, MD   1 year ago Primary hypertension   [place] [facility] Duanne Butler DASEN, MD             Off-Protocol Failed - 12/02/2024  4:27 PM      Failed - Medication not assigned to a protocol, review manually.      Passed - Valid encounter within last 12 months    Recent Outpatient Visits           1 month ago Benign essential HTN   [place] [facility] Pickard, Butler DASEN, MD   9 months ago Grief   [place] [facility] Duanne, Butler DASEN, MD   1 year ago Primary hypertension   [place] [facility] Duanne, Butler DASEN, MD   1 year ago Benign essential HTN   [place] [facility] Pickard, Butler DASEN, MD   1 year ago Primary hypertension    [place] [facility] Pickard, Butler DASEN, MD

## 2024-12-02 NOTE — Telephone Encounter (Signed)
 Copied from CRM [id number]. Topic: Clinical - Medication Refill >> Dec 02, 2024  7:32 AM Emylou G wrote: Medication: oxyCODONE -acetaminophen  (PERCOCET) 10-325 MG tablet ipratropium (ATROVENT ) 0.06 % nasal spray  Has the patient contacted their pharmacy? No (Agent: If no, request that the patient contact the pharmacy for the refill. If patient does not wish to contact the pharmacy document the reason why and proceed with request.) (Agent: If yes, when and what did the pharmacy advise?)  This is the patient's preferred pharmacy:  [id number] [place], [place] - [address] [place] [address] Phone: [phone] Fax: [phone]  Is this the correct pharmacy for this prescription? Yes If no, delete pharmacy and type the correct one.   Has the prescription been filled recently? No  Is the patient out of the medication? Yes  Has the patient been seen for an appointment in the last year OR does the patient have an upcoming appointment? yes  Can we respond through MyChart? No  Agent: Please be advised that Rx refills may take up to 3 business days. We ask that you follow-up with your pharmacy.

## 2024-12-03 MED ORDER — IPRATROPIUM BROMIDE 0.06 % NA SOLN: NASAL | 1 refills | Status: AC

## 2024-12-03 MED ORDER — OXYCODONE-ACETAMINOPHEN 10-325 MG PO TABS: 1.0000 | ORAL_TABLET | ORAL | 0 refills | Status: AC | PRN

## 2025-01-19 ENCOUNTER — Encounter (INDEPENDENT_AMBULATORY_CARE_PROVIDER_SITE_OTHER): Admitting: Ophthalmology
